# Patient Record
Sex: Female | Born: 1945
Health system: Southern US, Community
[De-identification: ages and names within clinical notes are randomized; demographics above are authoritative.]

## PROBLEM LIST (undated history)

## (undated) DIAGNOSIS — E785 Hyperlipidemia, unspecified: Secondary | ICD-10-CM

## (undated) DIAGNOSIS — M869 Osteomyelitis, unspecified: Secondary | ICD-10-CM

## (undated) DIAGNOSIS — R110 Nausea: Secondary | ICD-10-CM

## (undated) DIAGNOSIS — I10 Essential (primary) hypertension: Secondary | ICD-10-CM

## (undated) DIAGNOSIS — E039 Hypothyroidism, unspecified: Secondary | ICD-10-CM

## (undated) DIAGNOSIS — E669 Obesity, unspecified: Secondary | ICD-10-CM

## (undated) DIAGNOSIS — Z8489 Family history of other specified conditions: Secondary | ICD-10-CM

## (undated) DIAGNOSIS — M1990 Unspecified osteoarthritis, unspecified site: Secondary | ICD-10-CM

## (undated) HISTORY — PX: OTHER SURGICAL HISTORY: SHX169

## (undated) HISTORY — PX: CHOLECYSTECTOMY: SHX55

## (undated) HISTORY — DX: Hyperlipidemia, unspecified: E78.5

## (undated) HISTORY — DX: Obesity, unspecified: E66.9

## (undated) HISTORY — DX: Osteomyelitis, unspecified: M86.9

## (undated) HISTORY — PX: EYE SURGERY: SHX253

## (undated) HISTORY — PX: BREAST EXCISIONAL BIOPSY: SUR124

## (undated) HISTORY — DX: Hypothyroidism, unspecified: E03.9

## (undated) HISTORY — PX: TONSILLECTOMY: SUR1361

## (undated) HISTORY — DX: Unspecified osteoarthritis, unspecified site: M19.90

## (undated) HISTORY — PX: ABDOMINAL HYSTERECTOMY: SHX81

---

## 1982-08-26 HISTORY — PX: BACK SURGERY: SHX140

## 1991-08-27 HISTORY — PX: CARPAL TUNNEL RELEASE: SHX101

## 1996-02-24 HISTORY — PX: ANKLE FUSION: SHX881

## 1997-10-24 ENCOUNTER — Encounter (INDEPENDENT_AMBULATORY_CARE_PROVIDER_SITE_OTHER): Payer: Self-pay | Admitting: *Deleted

## 1997-10-24 LAB — CONVERTED CEMR LAB

## 1998-03-14 ENCOUNTER — Encounter: Admission: RE | Admit: 1998-03-14 | Discharge: 1998-03-14 | Payer: Self-pay | Admitting: Family Medicine

## 1998-03-16 ENCOUNTER — Ambulatory Visit (HOSPITAL_COMMUNITY): Admission: RE | Admit: 1998-03-16 | Discharge: 1998-03-16 | Payer: Self-pay | Admitting: *Deleted

## 1998-03-21 ENCOUNTER — Encounter: Admission: RE | Admit: 1998-03-21 | Discharge: 1998-03-21 | Payer: Self-pay | Admitting: Sports Medicine

## 1998-04-06 ENCOUNTER — Encounter: Admission: RE | Admit: 1998-04-06 | Discharge: 1998-04-06 | Payer: Self-pay | Admitting: Family Medicine

## 1998-04-26 ENCOUNTER — Encounter: Admission: RE | Admit: 1998-04-26 | Discharge: 1998-04-26 | Payer: Self-pay | Admitting: Family Medicine

## 1998-06-20 ENCOUNTER — Encounter: Admission: RE | Admit: 1998-06-20 | Discharge: 1998-06-20 | Payer: Self-pay | Admitting: Sports Medicine

## 1998-07-05 ENCOUNTER — Encounter: Admission: RE | Admit: 1998-07-05 | Discharge: 1998-07-05 | Payer: Self-pay | Admitting: Family Medicine

## 1998-07-11 ENCOUNTER — Encounter: Admission: RE | Admit: 1998-07-11 | Discharge: 1998-10-09 | Payer: Self-pay | Admitting: *Deleted

## 1998-08-01 ENCOUNTER — Encounter: Admission: RE | Admit: 1998-08-01 | Discharge: 1998-08-01 | Payer: Self-pay | Admitting: Sports Medicine

## 1998-08-07 ENCOUNTER — Encounter: Admission: RE | Admit: 1998-08-07 | Discharge: 1998-08-07 | Payer: Self-pay | Admitting: Family Medicine

## 1998-08-29 ENCOUNTER — Encounter: Admission: RE | Admit: 1998-08-29 | Discharge: 1998-08-29 | Payer: Self-pay | Admitting: Sports Medicine

## 1998-09-25 ENCOUNTER — Encounter: Admission: RE | Admit: 1998-09-25 | Discharge: 1998-09-25 | Payer: Self-pay | Admitting: Family Medicine

## 1998-11-02 ENCOUNTER — Encounter: Admission: RE | Admit: 1998-11-02 | Discharge: 1998-11-02 | Payer: Self-pay | Admitting: Family Medicine

## 1998-11-21 ENCOUNTER — Encounter: Admission: RE | Admit: 1998-11-21 | Discharge: 1998-11-21 | Payer: Self-pay | Admitting: Family Medicine

## 1998-11-21 ENCOUNTER — Ambulatory Visit (HOSPITAL_COMMUNITY): Admission: RE | Admit: 1998-11-21 | Discharge: 1998-11-21 | Payer: Self-pay

## 1998-12-14 ENCOUNTER — Encounter: Admission: RE | Admit: 1998-12-14 | Discharge: 1998-12-14 | Payer: Self-pay | Admitting: Family Medicine

## 1999-02-01 ENCOUNTER — Encounter: Admission: RE | Admit: 1999-02-01 | Discharge: 1999-02-01 | Payer: Self-pay | Admitting: Family Medicine

## 1999-04-25 ENCOUNTER — Encounter: Admission: RE | Admit: 1999-04-25 | Discharge: 1999-04-25 | Payer: Self-pay | Admitting: Family Medicine

## 1999-05-09 ENCOUNTER — Encounter: Admission: RE | Admit: 1999-05-09 | Discharge: 1999-05-09 | Payer: Self-pay | Admitting: Family Medicine

## 1999-06-22 ENCOUNTER — Encounter: Admission: RE | Admit: 1999-06-22 | Discharge: 1999-06-22 | Payer: Self-pay | Admitting: Sports Medicine

## 1999-09-26 ENCOUNTER — Encounter: Admission: RE | Admit: 1999-09-26 | Discharge: 1999-09-26 | Payer: Self-pay | Admitting: Family Medicine

## 2000-02-08 ENCOUNTER — Encounter: Admission: RE | Admit: 2000-02-08 | Discharge: 2000-02-08 | Payer: Self-pay | Admitting: Sports Medicine

## 2000-05-07 ENCOUNTER — Encounter: Admission: RE | Admit: 2000-05-07 | Discharge: 2000-05-07 | Payer: Self-pay | Admitting: *Deleted

## 2000-05-21 ENCOUNTER — Encounter: Admission: RE | Admit: 2000-05-21 | Discharge: 2000-05-21 | Payer: Self-pay | Admitting: Family Medicine

## 2000-07-10 ENCOUNTER — Encounter: Admission: RE | Admit: 2000-07-10 | Discharge: 2000-07-10 | Payer: Self-pay | Admitting: Family Medicine

## 2000-09-09 ENCOUNTER — Encounter: Admission: RE | Admit: 2000-09-09 | Discharge: 2000-09-09 | Payer: Self-pay | Admitting: Sports Medicine

## 2000-10-01 ENCOUNTER — Encounter: Admission: RE | Admit: 2000-10-01 | Discharge: 2000-10-01 | Payer: Self-pay | Admitting: Family Medicine

## 2000-12-17 ENCOUNTER — Encounter: Admission: RE | Admit: 2000-12-17 | Discharge: 2000-12-17 | Payer: Self-pay | Admitting: Family Medicine

## 2001-02-09 ENCOUNTER — Encounter: Admission: RE | Admit: 2001-02-09 | Discharge: 2001-02-09 | Payer: Self-pay | Admitting: Family Medicine

## 2001-04-21 ENCOUNTER — Encounter: Admission: RE | Admit: 2001-04-21 | Discharge: 2001-04-21 | Payer: Self-pay | Admitting: Pediatrics

## 2001-05-08 ENCOUNTER — Encounter: Payer: Self-pay | Admitting: Sports Medicine

## 2001-05-08 ENCOUNTER — Encounter: Admission: RE | Admit: 2001-05-08 | Discharge: 2001-05-08 | Payer: Self-pay | Admitting: Sports Medicine

## 2001-07-15 ENCOUNTER — Encounter: Admission: RE | Admit: 2001-07-15 | Discharge: 2001-07-15 | Payer: Self-pay | Admitting: Family Medicine

## 2001-08-14 ENCOUNTER — Encounter: Admission: RE | Admit: 2001-08-14 | Discharge: 2001-08-14 | Payer: Self-pay | Admitting: Family Medicine

## 2001-10-05 ENCOUNTER — Encounter: Admission: RE | Admit: 2001-10-05 | Discharge: 2001-10-05 | Payer: Self-pay | Admitting: Family Medicine

## 2001-12-29 ENCOUNTER — Encounter: Admission: RE | Admit: 2001-12-29 | Discharge: 2001-12-29 | Payer: Self-pay | Admitting: Family Medicine

## 2001-12-29 HISTORY — PX: NECK MASS EXCISION: SHX2079

## 2002-04-19 ENCOUNTER — Encounter: Admission: RE | Admit: 2002-04-19 | Discharge: 2002-04-19 | Payer: Self-pay | Admitting: Family Medicine

## 2002-04-21 ENCOUNTER — Encounter: Admission: RE | Admit: 2002-04-21 | Discharge: 2002-04-21 | Payer: Self-pay | Admitting: Family Medicine

## 2002-05-03 ENCOUNTER — Encounter: Admission: RE | Admit: 2002-05-03 | Discharge: 2002-05-03 | Payer: Self-pay | Admitting: Sports Medicine

## 2002-05-03 ENCOUNTER — Encounter: Payer: Self-pay | Admitting: Sports Medicine

## 2002-05-24 ENCOUNTER — Encounter: Admission: RE | Admit: 2002-05-24 | Discharge: 2002-05-24 | Payer: Self-pay | Admitting: Sports Medicine

## 2002-08-04 ENCOUNTER — Encounter: Admission: RE | Admit: 2002-08-04 | Discharge: 2002-08-04 | Payer: Self-pay | Admitting: Family Medicine

## 2002-09-22 ENCOUNTER — Encounter: Admission: RE | Admit: 2002-09-22 | Discharge: 2002-09-22 | Payer: Self-pay | Admitting: Family Medicine

## 2002-10-29 ENCOUNTER — Encounter: Admission: RE | Admit: 2002-10-29 | Discharge: 2002-10-29 | Payer: Self-pay | Admitting: Family Medicine

## 2003-01-21 ENCOUNTER — Encounter: Admission: RE | Admit: 2003-01-21 | Discharge: 2003-01-21 | Payer: Self-pay | Admitting: Family Medicine

## 2003-03-25 ENCOUNTER — Encounter: Admission: RE | Admit: 2003-03-25 | Discharge: 2003-03-25 | Payer: Self-pay | Admitting: Family Medicine

## 2003-04-25 ENCOUNTER — Encounter: Admission: RE | Admit: 2003-04-25 | Discharge: 2003-04-25 | Payer: Self-pay | Admitting: Sports Medicine

## 2003-05-19 ENCOUNTER — Encounter: Payer: Self-pay | Admitting: Sports Medicine

## 2003-05-19 ENCOUNTER — Encounter: Admission: RE | Admit: 2003-05-19 | Discharge: 2003-05-19 | Payer: Self-pay | Admitting: Sports Medicine

## 2003-05-26 ENCOUNTER — Encounter: Payer: Self-pay | Admitting: *Deleted

## 2003-05-26 ENCOUNTER — Encounter: Admission: RE | Admit: 2003-05-26 | Discharge: 2003-05-26 | Payer: Self-pay | Admitting: Family Medicine

## 2003-05-26 ENCOUNTER — Inpatient Hospital Stay (HOSPITAL_COMMUNITY): Admission: AD | Admit: 2003-05-26 | Discharge: 2003-05-28 | Payer: Self-pay | Admitting: Emergency Medicine

## 2003-05-27 ENCOUNTER — Encounter: Payer: Self-pay | Admitting: Cardiology

## 2003-06-20 ENCOUNTER — Encounter: Admission: RE | Admit: 2003-06-20 | Discharge: 2003-06-20 | Payer: Self-pay | Admitting: Family Medicine

## 2003-06-22 ENCOUNTER — Encounter: Admission: RE | Admit: 2003-06-22 | Discharge: 2003-06-22 | Payer: Self-pay | Admitting: Family Medicine

## 2003-06-28 ENCOUNTER — Encounter: Admission: RE | Admit: 2003-06-28 | Discharge: 2003-07-08 | Payer: Self-pay | Admitting: Family Medicine

## 2003-07-14 ENCOUNTER — Encounter: Admission: RE | Admit: 2003-07-14 | Discharge: 2003-07-14 | Payer: Self-pay | Admitting: Family Medicine

## 2003-09-13 ENCOUNTER — Encounter: Admission: RE | Admit: 2003-09-13 | Discharge: 2003-09-13 | Payer: Self-pay | Admitting: Family Medicine

## 2003-09-23 ENCOUNTER — Encounter: Admission: RE | Admit: 2003-09-23 | Discharge: 2003-09-23 | Payer: Self-pay | Admitting: Sports Medicine

## 2003-09-23 ENCOUNTER — Emergency Department (HOSPITAL_COMMUNITY): Admission: EM | Admit: 2003-09-23 | Discharge: 2003-09-23 | Payer: Self-pay | Admitting: *Deleted

## 2003-09-26 ENCOUNTER — Encounter: Admission: RE | Admit: 2003-09-26 | Discharge: 2003-09-26 | Payer: Self-pay | Admitting: Family Medicine

## 2003-09-26 ENCOUNTER — Emergency Department (HOSPITAL_COMMUNITY): Admission: EM | Admit: 2003-09-26 | Discharge: 2003-09-26 | Payer: Self-pay | Admitting: Emergency Medicine

## 2003-09-27 ENCOUNTER — Encounter: Admission: RE | Admit: 2003-09-27 | Discharge: 2003-09-27 | Payer: Self-pay | Admitting: Family Medicine

## 2003-09-30 ENCOUNTER — Encounter: Admission: RE | Admit: 2003-09-30 | Discharge: 2003-09-30 | Payer: Self-pay | Admitting: Family Medicine

## 2003-10-13 ENCOUNTER — Encounter: Admission: RE | Admit: 2003-10-13 | Discharge: 2004-01-11 | Payer: Self-pay | Admitting: Family Medicine

## 2003-10-14 ENCOUNTER — Encounter: Admission: RE | Admit: 2003-10-14 | Discharge: 2003-10-14 | Payer: Self-pay | Admitting: Family Medicine

## 2003-12-19 ENCOUNTER — Encounter: Admission: RE | Admit: 2003-12-19 | Discharge: 2003-12-19 | Payer: Self-pay | Admitting: Family Medicine

## 2004-01-02 ENCOUNTER — Encounter (HOSPITAL_BASED_OUTPATIENT_CLINIC_OR_DEPARTMENT_OTHER): Admission: RE | Admit: 2004-01-02 | Discharge: 2004-01-05 | Payer: Self-pay | Admitting: Internal Medicine

## 2004-01-16 ENCOUNTER — Encounter: Admission: RE | Admit: 2004-01-16 | Discharge: 2004-01-16 | Payer: Self-pay | Admitting: Family Medicine

## 2004-04-06 ENCOUNTER — Encounter (HOSPITAL_BASED_OUTPATIENT_CLINIC_OR_DEPARTMENT_OTHER): Admission: RE | Admit: 2004-04-06 | Discharge: 2004-04-13 | Payer: Self-pay | Admitting: Internal Medicine

## 2004-04-13 ENCOUNTER — Encounter: Admission: RE | Admit: 2004-04-13 | Discharge: 2004-04-13 | Payer: Self-pay | Admitting: Family Medicine

## 2004-06-04 ENCOUNTER — Emergency Department (HOSPITAL_COMMUNITY): Admission: EM | Admit: 2004-06-04 | Discharge: 2004-06-04 | Payer: Self-pay | Admitting: Emergency Medicine

## 2004-06-13 ENCOUNTER — Ambulatory Visit: Payer: Self-pay | Admitting: Family Medicine

## 2004-06-20 ENCOUNTER — Encounter: Admission: RE | Admit: 2004-06-20 | Discharge: 2004-06-20 | Payer: Self-pay | Admitting: Sports Medicine

## 2004-07-16 ENCOUNTER — Ambulatory Visit: Payer: Self-pay | Admitting: Family Medicine

## 2004-07-31 ENCOUNTER — Ambulatory Visit: Payer: Self-pay | Admitting: Sports Medicine

## 2004-08-16 ENCOUNTER — Ambulatory Visit: Payer: Self-pay | Admitting: Sports Medicine

## 2004-10-01 ENCOUNTER — Ambulatory Visit: Payer: Self-pay | Admitting: Sports Medicine

## 2004-10-11 ENCOUNTER — Emergency Department (HOSPITAL_COMMUNITY): Admission: EM | Admit: 2004-10-11 | Discharge: 2004-10-11 | Payer: Self-pay | Admitting: Emergency Medicine

## 2004-10-29 ENCOUNTER — Ambulatory Visit: Payer: Self-pay | Admitting: Sports Medicine

## 2004-12-25 ENCOUNTER — Ambulatory Visit: Payer: Self-pay | Admitting: Family Medicine

## 2005-01-03 ENCOUNTER — Encounter: Admission: RE | Admit: 2005-01-03 | Discharge: 2005-01-03 | Payer: Self-pay | Admitting: Sports Medicine

## 2005-01-23 ENCOUNTER — Ambulatory Visit: Payer: Self-pay | Admitting: Family Medicine

## 2005-04-18 ENCOUNTER — Ambulatory Visit: Payer: Self-pay | Admitting: Sports Medicine

## 2005-04-24 ENCOUNTER — Ambulatory Visit: Payer: Self-pay | Admitting: Family Medicine

## 2005-05-01 ENCOUNTER — Emergency Department (HOSPITAL_COMMUNITY): Admission: EM | Admit: 2005-05-01 | Discharge: 2005-05-01 | Payer: Self-pay | Admitting: Emergency Medicine

## 2005-05-30 ENCOUNTER — Ambulatory Visit: Payer: Self-pay | Admitting: Family Medicine

## 2005-07-11 ENCOUNTER — Emergency Department (HOSPITAL_COMMUNITY): Admission: EM | Admit: 2005-07-11 | Discharge: 2005-07-11 | Payer: Self-pay | Admitting: Family Medicine

## 2005-07-17 ENCOUNTER — Ambulatory Visit: Payer: Self-pay | Admitting: Family Medicine

## 2005-08-09 ENCOUNTER — Encounter: Admission: RE | Admit: 2005-08-09 | Discharge: 2005-08-09 | Payer: Self-pay | Admitting: Sports Medicine

## 2005-10-01 ENCOUNTER — Ambulatory Visit: Payer: Self-pay | Admitting: Family Medicine

## 2005-12-27 ENCOUNTER — Ambulatory Visit: Payer: Self-pay | Admitting: Family Medicine

## 2006-03-07 ENCOUNTER — Ambulatory Visit: Payer: Self-pay | Admitting: Family Medicine

## 2006-04-29 ENCOUNTER — Ambulatory Visit: Payer: Self-pay | Admitting: Sports Medicine

## 2006-05-21 ENCOUNTER — Ambulatory Visit: Payer: Self-pay | Admitting: Family Medicine

## 2006-05-22 ENCOUNTER — Ambulatory Visit: Payer: Self-pay | Admitting: Sports Medicine

## 2006-08-13 ENCOUNTER — Ambulatory Visit: Payer: Self-pay | Admitting: Family Medicine

## 2006-10-02 ENCOUNTER — Encounter: Admission: RE | Admit: 2006-10-02 | Discharge: 2006-10-02 | Payer: Self-pay | Admitting: Sports Medicine

## 2006-10-23 DIAGNOSIS — M159 Polyosteoarthritis, unspecified: Secondary | ICD-10-CM | POA: Insufficient documentation

## 2006-10-23 DIAGNOSIS — E669 Obesity, unspecified: Secondary | ICD-10-CM | POA: Insufficient documentation

## 2006-10-23 DIAGNOSIS — E039 Hypothyroidism, unspecified: Secondary | ICD-10-CM | POA: Insufficient documentation

## 2006-10-23 DIAGNOSIS — I10 Essential (primary) hypertension: Secondary | ICD-10-CM | POA: Insufficient documentation

## 2006-10-24 ENCOUNTER — Encounter (INDEPENDENT_AMBULATORY_CARE_PROVIDER_SITE_OTHER): Payer: Self-pay | Admitting: *Deleted

## 2006-11-27 ENCOUNTER — Telehealth (INDEPENDENT_AMBULATORY_CARE_PROVIDER_SITE_OTHER): Payer: Self-pay | Admitting: *Deleted

## 2006-12-11 ENCOUNTER — Ambulatory Visit: Payer: Self-pay | Admitting: Family Medicine

## 2006-12-11 LAB — CONVERTED CEMR LAB: Hgb A1c MFr Bld: 6.7 %

## 2007-03-18 ENCOUNTER — Encounter (INDEPENDENT_AMBULATORY_CARE_PROVIDER_SITE_OTHER): Payer: Self-pay | Admitting: Family Medicine

## 2007-03-18 ENCOUNTER — Ambulatory Visit: Payer: Self-pay | Admitting: Family Medicine

## 2007-03-18 DIAGNOSIS — E785 Hyperlipidemia, unspecified: Secondary | ICD-10-CM | POA: Insufficient documentation

## 2007-03-18 LAB — CONVERTED CEMR LAB: Hgb A1c MFr Bld: 6.8 %

## 2007-03-19 ENCOUNTER — Encounter (INDEPENDENT_AMBULATORY_CARE_PROVIDER_SITE_OTHER): Payer: Self-pay | Admitting: Family Medicine

## 2007-03-19 LAB — CONVERTED CEMR LAB
ALT: <8 units/L (ref 0–35)
AST: 10 units/L (ref 0–37)
Albumin: 4 g/dL (ref 3.5–5.2)
Alkaline Phosphatase: 59 units/L (ref 39–117)
BUN: 23 mg/dL (ref 6–23)
CO2: 27 meq/L (ref 19–32)
Calcium: 9.4 mg/dL (ref 8.4–10.5)
Chloride: 105 meq/L (ref 96–112)
Creatinine, Ser: 0.88 mg/dL (ref 0.40–1.20)
Direct LDL: 127 mg/dL — ABNORMAL HIGH
Glucose, Bld: 158 mg/dL — ABNORMAL HIGH (ref 70–99)
Potassium: 3.6 meq/L (ref 3.5–5.3)
Sodium: 146 meq/L — ABNORMAL HIGH (ref 135–145)
TSH: 1.626 microintl units/mL (ref 0.350–5.50)
Total Bilirubin: 0.2 mg/dL — ABNORMAL LOW (ref 0.3–1.2)
Total Protein: 6.9 g/dL (ref 6.0–8.3)

## 2007-03-24 ENCOUNTER — Encounter: Payer: Self-pay | Admitting: *Deleted

## 2007-03-26 ENCOUNTER — Telehealth: Payer: Self-pay | Admitting: *Deleted

## 2007-04-17 ENCOUNTER — Telehealth (INDEPENDENT_AMBULATORY_CARE_PROVIDER_SITE_OTHER): Payer: Self-pay | Admitting: Family Medicine

## 2007-06-19 ENCOUNTER — Ambulatory Visit: Payer: Self-pay | Admitting: Family Medicine

## 2007-06-19 LAB — CONVERTED CEMR LAB: Hgb A1c MFr Bld: 6.9 %

## 2007-07-09 ENCOUNTER — Encounter (INDEPENDENT_AMBULATORY_CARE_PROVIDER_SITE_OTHER): Payer: Self-pay | Admitting: Family Medicine

## 2007-07-17 ENCOUNTER — Ambulatory Visit: Payer: Self-pay | Admitting: Family Medicine

## 2007-07-17 ENCOUNTER — Encounter (INDEPENDENT_AMBULATORY_CARE_PROVIDER_SITE_OTHER): Payer: Self-pay | Admitting: Family Medicine

## 2007-07-17 LAB — CONVERTED CEMR LAB
ALT: <8 units/L (ref 0–35)
AST: 11 units/L (ref 0–37)
Albumin: 4.5 g/dL (ref 3.5–5.2)
Alkaline Phosphatase: 68 units/L (ref 39–117)
BUN: 15 mg/dL (ref 6–23)
CO2: 28 meq/L (ref 19–32)
Calcium: 10.2 mg/dL (ref 8.4–10.5)
Chloride: 102 meq/L (ref 96–112)
Creatinine, Ser: 0.89 mg/dL (ref 0.40–1.20)
Direct LDL: 97 mg/dL
Glucose, Bld: 103 mg/dL — ABNORMAL HIGH (ref 70–99)
Potassium: 3.9 meq/L (ref 3.5–5.3)
Sodium: 143 meq/L (ref 135–145)
Total Bilirubin: 0.4 mg/dL (ref 0.3–1.2)
Total Protein: 7.3 g/dL (ref 6.0–8.3)

## 2007-07-20 ENCOUNTER — Telehealth (INDEPENDENT_AMBULATORY_CARE_PROVIDER_SITE_OTHER): Payer: Self-pay | Admitting: Family Medicine

## 2007-08-04 ENCOUNTER — Encounter (INDEPENDENT_AMBULATORY_CARE_PROVIDER_SITE_OTHER): Payer: Self-pay | Admitting: Family Medicine

## 2007-08-05 ENCOUNTER — Encounter (INDEPENDENT_AMBULATORY_CARE_PROVIDER_SITE_OTHER): Payer: Self-pay | Admitting: Family Medicine

## 2007-10-05 ENCOUNTER — Encounter: Admission: RE | Admit: 2007-10-05 | Discharge: 2007-10-05 | Payer: Self-pay | Admitting: Sports Medicine

## 2007-10-12 ENCOUNTER — Ambulatory Visit: Payer: Self-pay | Admitting: Sports Medicine

## 2007-10-12 ENCOUNTER — Encounter (INDEPENDENT_AMBULATORY_CARE_PROVIDER_SITE_OTHER): Payer: Self-pay | Admitting: Family Medicine

## 2007-10-12 LAB — CONVERTED CEMR LAB
ALT: <8 units/L (ref 0–35)
AST: 10 units/L (ref 0–37)
Albumin: 4.2 g/dL (ref 3.5–5.2)
Alkaline Phosphatase: 65 units/L (ref 39–117)
BUN: 28 mg/dL — ABNORMAL HIGH (ref 6–23)
CO2: 26 meq/L (ref 19–32)
Calcium: 9.8 mg/dL (ref 8.4–10.5)
Chloride: 104 meq/L (ref 96–112)
Creatinine, Ser: 0.87 mg/dL (ref 0.40–1.20)
Direct LDL: 81 mg/dL
Glucose, Bld: 111 mg/dL — ABNORMAL HIGH (ref 70–99)
Hgb A1c MFr Bld: 6.9 %
Potassium: 3.9 meq/L (ref 3.5–5.3)
Sodium: 143 meq/L (ref 135–145)
Total Bilirubin: 0.3 mg/dL (ref 0.3–1.2)
Total Protein: 6.6 g/dL (ref 6.0–8.3)

## 2007-10-13 ENCOUNTER — Encounter (INDEPENDENT_AMBULATORY_CARE_PROVIDER_SITE_OTHER): Payer: Self-pay | Admitting: Family Medicine

## 2007-10-14 ENCOUNTER — Telehealth: Payer: Self-pay | Admitting: *Deleted

## 2007-10-19 ENCOUNTER — Ambulatory Visit: Payer: Self-pay | Admitting: Sports Medicine

## 2007-12-11 ENCOUNTER — Ambulatory Visit: Payer: Self-pay | Admitting: Family Medicine

## 2007-12-15 ENCOUNTER — Telehealth (INDEPENDENT_AMBULATORY_CARE_PROVIDER_SITE_OTHER): Payer: Self-pay | Admitting: Family Medicine

## 2008-02-11 ENCOUNTER — Encounter (INDEPENDENT_AMBULATORY_CARE_PROVIDER_SITE_OTHER): Payer: Self-pay | Admitting: Family Medicine

## 2008-02-17 ENCOUNTER — Encounter (INDEPENDENT_AMBULATORY_CARE_PROVIDER_SITE_OTHER): Payer: Self-pay | Admitting: Family Medicine

## 2008-02-17 ENCOUNTER — Ambulatory Visit: Payer: Self-pay | Admitting: Family Medicine

## 2008-02-17 LAB — CONVERTED CEMR LAB

## 2008-02-18 ENCOUNTER — Encounter (INDEPENDENT_AMBULATORY_CARE_PROVIDER_SITE_OTHER): Payer: Self-pay | Admitting: Family Medicine

## 2008-02-18 LAB — CONVERTED CEMR LAB
ALT: <8 units/L (ref 0–35)
AST: 11 units/L (ref 0–37)
Albumin: 4.2 g/dL (ref 3.5–5.2)
Alkaline Phosphatase: 63 units/L (ref 39–117)
BUN: 33 mg/dL — ABNORMAL HIGH (ref 6–23)
CO2: 26 meq/L (ref 19–32)
Calcium: 9.7 mg/dL (ref 8.4–10.5)
Chloride: 99 meq/L (ref 96–112)
Creatinine, Ser: 1.01 mg/dL (ref 0.40–1.20)
Direct LDL: 79 mg/dL
Glucose, Bld: 128 mg/dL — ABNORMAL HIGH (ref 70–99)
Potassium: 3.8 meq/L (ref 3.5–5.3)
Sodium: 141 meq/L (ref 135–145)
TSH: 0.168 microintl units/mL — ABNORMAL LOW (ref 0.350–5.50)
Total Bilirubin: 0.3 mg/dL (ref 0.3–1.2)
Total Protein: 6.9 g/dL (ref 6.0–8.3)

## 2008-03-01 ENCOUNTER — Telehealth: Payer: Self-pay | Admitting: *Deleted

## 2008-03-22 ENCOUNTER — Ambulatory Visit: Payer: Self-pay | Admitting: Family Medicine

## 2008-04-19 ENCOUNTER — Telehealth: Payer: Self-pay | Admitting: *Deleted

## 2008-04-20 ENCOUNTER — Ambulatory Visit: Payer: Self-pay | Admitting: Family Medicine

## 2008-06-01 ENCOUNTER — Encounter: Payer: Self-pay | Admitting: Family Medicine

## 2008-06-01 ENCOUNTER — Ambulatory Visit: Payer: Self-pay | Admitting: Family Medicine

## 2008-06-01 LAB — CONVERTED CEMR LAB
BUN: 25 mg/dL — ABNORMAL HIGH (ref 6–23)
CO2: 23 meq/L (ref 19–32)
Calcium: 10 mg/dL (ref 8.4–10.5)
Chloride: 98 meq/L (ref 96–112)
Cholesterol, target level: 200 mg/dL
Creatinine, Ser: 0.9 mg/dL (ref 0.40–1.20)
Glucose, Bld: 289 mg/dL — ABNORMAL HIGH (ref 70–99)
HDL goal, serum: 40 mg/dL
Hgb A1c MFr Bld: 9.9 %
LDL Goal: 100 mg/dL
Microalbumin U total vol: NORMAL mg/L
Potassium: 4.3 meq/L (ref 3.5–5.3)
Sodium: 136 meq/L (ref 135–145)
TSH: 0.216 microintl units/mL — ABNORMAL LOW (ref 0.350–4.50)

## 2008-06-03 ENCOUNTER — Telehealth: Payer: Self-pay | Admitting: Family Medicine

## 2008-06-23 ENCOUNTER — Ambulatory Visit: Payer: Self-pay | Admitting: Family Medicine

## 2008-06-23 ENCOUNTER — Telehealth (INDEPENDENT_AMBULATORY_CARE_PROVIDER_SITE_OTHER): Payer: Self-pay | Admitting: *Deleted

## 2008-08-12 ENCOUNTER — Ambulatory Visit: Payer: Self-pay | Admitting: Family Medicine

## 2008-08-12 ENCOUNTER — Encounter: Payer: Self-pay | Admitting: Family Medicine

## 2008-08-12 LAB — CONVERTED CEMR LAB
ALT: 10 units/L (ref 0–35)
AST: 10 units/L (ref 0–37)
Albumin: 4.4 g/dL (ref 3.5–5.2)
Alkaline Phosphatase: 74 units/L (ref 39–117)
BUN: 20 mg/dL (ref 6–23)
CO2: 27 meq/L (ref 19–32)
Calcium: 10.2 mg/dL (ref 8.4–10.5)
Chloride: 99 meq/L (ref 96–112)
Creatinine, Ser: 0.87 mg/dL (ref 0.40–1.20)
Glucose, Bld: 171 mg/dL — ABNORMAL HIGH (ref 70–99)
Potassium: 3.8 meq/L (ref 3.5–5.3)
Sodium: 139 meq/L (ref 135–145)
TSH: 0.848 microintl units/mL (ref 0.350–4.50)
Total Bilirubin: 0.4 mg/dL (ref 0.3–1.2)
Total Protein: 7.2 g/dL (ref 6.0–8.3)

## 2008-08-21 ENCOUNTER — Encounter: Payer: Self-pay | Admitting: Family Medicine

## 2008-10-07 ENCOUNTER — Ambulatory Visit: Payer: Self-pay | Admitting: Family Medicine

## 2008-10-07 DIAGNOSIS — M1990 Unspecified osteoarthritis, unspecified site: Secondary | ICD-10-CM

## 2008-10-07 HISTORY — DX: Unspecified osteoarthritis, unspecified site: M19.90

## 2008-10-07 LAB — CONVERTED CEMR LAB: Hgb A1c MFr Bld: 10.1 %

## 2008-10-13 ENCOUNTER — Encounter: Payer: Self-pay | Admitting: Family Medicine

## 2008-10-13 ENCOUNTER — Ambulatory Visit: Payer: Self-pay | Admitting: Family Medicine

## 2008-10-13 LAB — CONVERTED CEMR LAB
Cholesterol: 131 mg/dL (ref 0–200)
HDL: 43 mg/dL (ref >39)
LDL Cholesterol: 71 mg/dL (ref 0–99)
Total CHOL/HDL Ratio: 3
Triglycerides: 83 mg/dL (ref <150)
VLDL: 17 mg/dL (ref 0–40)

## 2008-11-28 ENCOUNTER — Encounter: Payer: Self-pay | Admitting: Family Medicine

## 2008-11-28 DIAGNOSIS — E1139 Type 2 diabetes mellitus with other diabetic ophthalmic complication: Secondary | ICD-10-CM | POA: Insufficient documentation

## 2008-11-28 DIAGNOSIS — E119 Type 2 diabetes mellitus without complications: Secondary | ICD-10-CM

## 2008-12-02 ENCOUNTER — Emergency Department: Payer: Self-pay | Admitting: Emergency Medicine

## 2009-01-30 ENCOUNTER — Ambulatory Visit: Payer: Self-pay | Admitting: Family Medicine

## 2009-01-30 ENCOUNTER — Encounter: Payer: Self-pay | Admitting: Family Medicine

## 2009-01-30 LAB — CONVERTED CEMR LAB
ALT: 9 units/L (ref 0–35)
AST: 13 units/L (ref 0–37)
Albumin: 4.1 g/dL (ref 3.5–5.2)
Alkaline Phosphatase: 55 units/L (ref 39–117)
BUN: 18 mg/dL (ref 6–23)
CO2: 27 meq/L (ref 19–32)
Calcium: 9.5 mg/dL (ref 8.4–10.5)
Chloride: 105 meq/L (ref 96–112)
Creatinine, Ser: 0.91 mg/dL (ref 0.40–1.20)
Glucose, Bld: 107 mg/dL — ABNORMAL HIGH (ref 70–99)
Hgb A1c MFr Bld: 8.6 %
Potassium: 4.2 meq/L (ref 3.5–5.3)
Sodium: 145 meq/L (ref 135–145)
Total Bilirubin: 0.3 mg/dL (ref 0.3–1.2)
Total Protein: 6.6 g/dL (ref 6.0–8.3)

## 2009-02-01 ENCOUNTER — Encounter: Payer: Self-pay | Admitting: Family Medicine

## 2009-02-02 ENCOUNTER — Telehealth: Payer: Self-pay | Admitting: Family Medicine

## 2009-03-15 ENCOUNTER — Telehealth: Payer: Self-pay | Admitting: Family Medicine

## 2009-03-15 ENCOUNTER — Ambulatory Visit: Payer: Self-pay | Admitting: Family Medicine

## 2009-05-10 ENCOUNTER — Ambulatory Visit: Payer: Self-pay | Admitting: Family Medicine

## 2009-05-10 LAB — CONVERTED CEMR LAB: Hgb A1c MFr Bld: 8.3 %

## 2009-06-12 ENCOUNTER — Ambulatory Visit: Payer: Self-pay | Admitting: Sports Medicine

## 2009-08-11 ENCOUNTER — Encounter: Payer: Self-pay | Admitting: Family Medicine

## 2009-08-11 ENCOUNTER — Ambulatory Visit: Payer: Self-pay | Admitting: Family Medicine

## 2009-08-11 LAB — CONVERTED CEMR LAB
ALT: 8 units/L (ref 0–35)
AST: 11 units/L (ref 0–37)
Albumin: 4.5 g/dL (ref 3.5–5.2)
Alkaline Phosphatase: 69 units/L (ref 39–117)
BUN: 22 mg/dL (ref 6–23)
CO2: 27 meq/L (ref 19–32)
Calcium: 10.2 mg/dL (ref 8.4–10.5)
Chloride: 98 meq/L (ref 96–112)
Creatinine, Ser: 0.94 mg/dL (ref 0.40–1.20)
Glucose, Bld: 177 mg/dL — ABNORMAL HIGH (ref 70–99)
Hgb A1c MFr Bld: 8.6 %
Potassium: 3.7 meq/L (ref 3.5–5.3)
Sodium: 140 meq/L (ref 135–145)
TSH: 0.941 microintl units/mL (ref 0.350–4.500)
Total Bilirubin: 0.3 mg/dL (ref 0.3–1.2)
Total Protein: 7.2 g/dL (ref 6.0–8.3)

## 2009-08-12 ENCOUNTER — Encounter: Payer: Self-pay | Admitting: Family Medicine

## 2009-09-05 ENCOUNTER — Encounter: Payer: Self-pay | Admitting: Family Medicine

## 2009-11-01 ENCOUNTER — Encounter: Payer: Self-pay | Admitting: Family Medicine

## 2009-11-01 ENCOUNTER — Ambulatory Visit: Payer: Self-pay | Admitting: Family Medicine

## 2009-11-01 LAB — CONVERTED CEMR LAB
Direct LDL: 168 mg/dL — ABNORMAL HIGH
Hgb A1c MFr Bld: 7.1 %

## 2009-11-07 ENCOUNTER — Telehealth: Payer: Self-pay | Admitting: Family Medicine

## 2009-12-13 ENCOUNTER — Encounter: Payer: Self-pay | Admitting: Family Medicine

## 2009-12-13 ENCOUNTER — Ambulatory Visit: Payer: Self-pay | Admitting: Family Medicine

## 2009-12-20 ENCOUNTER — Telehealth: Payer: Self-pay | Admitting: Family Medicine

## 2009-12-22 ENCOUNTER — Encounter: Payer: Self-pay | Admitting: Family Medicine

## 2009-12-22 ENCOUNTER — Telehealth: Payer: Self-pay | Admitting: Family Medicine

## 2009-12-22 ENCOUNTER — Ambulatory Visit: Payer: Self-pay | Admitting: Family Medicine

## 2009-12-25 ENCOUNTER — Encounter: Payer: Self-pay | Admitting: *Deleted

## 2010-02-06 ENCOUNTER — Encounter: Payer: Self-pay | Admitting: Family Medicine

## 2010-02-21 ENCOUNTER — Emergency Department: Payer: Self-pay | Admitting: Unknown Physician Specialty

## 2010-02-22 ENCOUNTER — Telehealth: Payer: Self-pay | Admitting: Sports Medicine

## 2010-02-23 ENCOUNTER — Ambulatory Visit: Payer: Self-pay | Admitting: Family Medicine

## 2010-02-23 ENCOUNTER — Encounter: Payer: Self-pay | Admitting: Sports Medicine

## 2010-02-23 DIAGNOSIS — K8020 Calculus of gallbladder without cholecystitis without obstruction: Secondary | ICD-10-CM | POA: Insufficient documentation

## 2010-03-01 ENCOUNTER — Ambulatory Visit: Payer: Self-pay | Admitting: Family Medicine

## 2010-03-01 ENCOUNTER — Encounter: Payer: Self-pay | Admitting: Sports Medicine

## 2010-03-01 LAB — CONVERTED CEMR LAB
Bilirubin Urine: NEGATIVE
Blood in Urine, dipstick: NEGATIVE
Cholesterol: 99 mg/dL (ref 0–200)
Glucose, Urine, Semiquant: NEGATIVE
HDL: 32 mg/dL — ABNORMAL LOW (ref >39)
Hgb A1c MFr Bld: 7.1 %
Ketones, urine, test strip: NEGATIVE
LDL Cholesterol: 36 mg/dL (ref 0–99)
Microalbumin U total vol: NORMAL mg/L
Nitrite: NEGATIVE
Protein, U semiquant: NEGATIVE
Specific Gravity, Urine: 1.025
Total CHOL/HDL Ratio: 3.1
Triglycerides: 157 mg/dL — ABNORMAL HIGH (ref <150)
Urobilinogen, UA: 0.2
VLDL: 31 mg/dL (ref 0–40)
pH: 5.5

## 2010-03-15 ENCOUNTER — Encounter: Admission: RE | Admit: 2010-03-15 | Discharge: 2010-03-15 | Payer: Self-pay | Admitting: Sports Medicine

## 2010-03-15 ENCOUNTER — Ambulatory Visit: Payer: Self-pay | Admitting: Family Medicine

## 2010-04-02 ENCOUNTER — Inpatient Hospital Stay (HOSPITAL_COMMUNITY): Admission: RE | Admit: 2010-04-02 | Discharge: 2010-04-04 | Payer: Self-pay | Admitting: General Surgery

## 2010-04-02 ENCOUNTER — Encounter (INDEPENDENT_AMBULATORY_CARE_PROVIDER_SITE_OTHER): Payer: Self-pay | Admitting: General Surgery

## 2010-04-04 ENCOUNTER — Encounter (INDEPENDENT_AMBULATORY_CARE_PROVIDER_SITE_OTHER): Payer: Self-pay | Admitting: General Surgery

## 2010-04-04 ENCOUNTER — Ambulatory Visit: Payer: Self-pay | Admitting: Vascular Surgery

## 2010-05-10 ENCOUNTER — Telehealth (INDEPENDENT_AMBULATORY_CARE_PROVIDER_SITE_OTHER): Payer: Self-pay | Admitting: Family Medicine

## 2010-05-29 ENCOUNTER — Ambulatory Visit: Payer: Self-pay | Admitting: Family Medicine

## 2010-05-29 ENCOUNTER — Encounter: Payer: Self-pay | Admitting: Sports Medicine

## 2010-05-29 LAB — CONVERTED CEMR LAB
HCT: 38.5 % (ref 36.0–46.0)
Hemoglobin: 11.7 g/dL — ABNORMAL LOW (ref 12.0–15.0)
MCHC: 30.4 g/dL (ref 30.0–36.0)
MCV: 83.3 fL (ref 78.0–100.0)
Platelets: 290 10*3/uL (ref 150–400)
RBC: 4.62 M/uL (ref 3.87–5.11)
RDW: 15.6 % — ABNORMAL HIGH (ref 11.5–15.5)
TSH: 0.67 microintl units/mL (ref 0.350–4.500)
WBC: 10 10*3/uL (ref 4.0–10.5)

## 2010-07-02 ENCOUNTER — Encounter: Payer: Self-pay | Admitting: Sports Medicine

## 2010-07-12 ENCOUNTER — Telehealth: Payer: Self-pay | Admitting: Sports Medicine

## 2010-09-05 ENCOUNTER — Encounter: Payer: Self-pay | Admitting: Sports Medicine

## 2010-09-05 ENCOUNTER — Ambulatory Visit: Admission: RE | Admit: 2010-09-05 | Discharge: 2010-09-05 | Payer: Self-pay | Source: Home / Self Care

## 2010-09-05 ENCOUNTER — Telehealth: Payer: Self-pay | Admitting: Family Medicine

## 2010-09-05 LAB — CONVERTED CEMR LAB
BUN: 14 mg/dL (ref 6–23)
CO2: 23 meq/L (ref 19–32)
Calcium: 9.7 mg/dL (ref 8.4–10.5)
Chloride: 104 meq/L (ref 96–112)
Creatinine, Ser: 0.82 mg/dL (ref 0.40–1.20)
Glucose, Bld: 90 mg/dL (ref 70–99)
HCT: 40.3 % (ref 36.0–46.0)
Hemoglobin: 12.6 g/dL (ref 12.0–15.0)
Hgb A1c MFr Bld: 7.9 %
MCHC: 31.3 g/dL (ref 30.0–36.0)
MCV: 84.3 fL (ref 78.0–100.0)
Platelets: 320 10*3/uL (ref 150–400)
Potassium: 4.5 meq/L (ref 3.5–5.3)
RBC: 4.78 M/uL (ref 3.87–5.11)
RDW: 15 % (ref 11.5–15.5)
Sodium: 137 meq/L (ref 135–145)
TSH: 1.772 microintl units/mL (ref 0.350–4.500)
WBC: 12.9 10*3/uL — ABNORMAL HIGH (ref 4.0–10.5)

## 2010-09-06 ENCOUNTER — Encounter: Payer: Self-pay | Admitting: *Deleted

## 2010-09-06 ENCOUNTER — Encounter: Payer: Self-pay | Admitting: Sports Medicine

## 2010-09-25 NOTE — Miscellaneous (Signed)
Summary: Rx Refills  Clinical Lists Changes Refilled and Faxed Rx refills for Metformin HCL 500mg  ( 2 tablets by mouth 2 times a day) with 1 RF, Synthroid 137 micrograms tablet (1 tablet once daily) wtih 1 RF, but did not refill Amlodipine 10 mg tablets because were not on med list in EMR.  Faxed abck to [facility] on [address] @ [phone] ...................................................................Maley Venezia CMA  March 24, 2007 10:59 AM

## 2010-09-25 NOTE — Consult Note (Signed)
Summary: [facility]  [facility]   Imported By: Denny Peon LEVAN 08/05/2007 12:03:19  _____________________________________________________________________  External Attachment:    Type:   Image     Comment:   External Document

## 2010-09-25 NOTE — Assessment & Plan Note (Signed)
Summary: FU MEDS/KH   Vital Signs:  Patient Profile:   65 Years Old Female Height:     67.5 inches (171.45 cm) Weight:      272.4 pounds BMI:     42.19 Temp:     98.0 degrees F oral Pulse rate:   66 / minute BP sitting:   133 / 63  (left arm)  Pt. in pain?   no  Vitals Entered By: Garen Grams LPN (October 19, 2007 11:08 AM)                  PCP:  Adrian Blackwater  MD  Chief Complaint:  f/u on meds.  History of Present Illness: HTN: No shortness of breath, chest pain, edema.  Tolerating medications well.    No side effects.  Brought meds with her so we could go over them.  DM: fastings have been 122-158.  Had decreased Lantus to 26.  14 day average was 170.         Physical Exam  General:     Well-developed,well-nourished,in no acute distress; alert,appropriate and cooperative throughout examination Lungs:     Normal respiratory effort, chest expands symmetrically. Lungs are clear to auscultation, no crackles or wheezes. Heart:     Normal rate and regular rhythm. S1 and S2 normal without gallop, murmur, click, rub or other extra sounds. Extremities:     no edema.    Impression & Recommendations:  Problem # 1:  HYPERTENSION, BENIGN SYSTEMIC (ICD-401.1) Assessment: Unchanged on two ace/arbs.  K+ ok but there is still a risk.  Will stop cozaar the more $$ one and doulbe up lisin/hctz to make up for it and increase her coverage since her DM goal is 130/80 and she is above this.  will f/u in 33mo. The following medications were removed from the medication list:    Cozaar 100 Mg Tabs (Losartan potassium) .Marland Kitchen... Take one tab by mouth qday  Her updated medication list for this problem includes:    Lisinopril-hydrochlorothiazide 20-12.5 Mg Tabs (Lisinopril-hydrochlorothiazide) .Marland Kitchen... 2  tablets by mouth qday    Coreg 12.5 Mg Tabs (Carvedilol) .Marland Kitchen... 1 by mouth bid  Orders: [facility]- Est Level  3 (01027)   Problem # 2:  DIABETES MELLITUS II, UNCOMPLICATED  (ICD-250.00) Assessment: Unchanged increasing lantus to 28 again. The following medications were removed from the medication list:    Cozaar 100 Mg Tabs (Losartan potassium) .Marland Kitchen... Take one tab by mouth qday  Her updated medication list for this problem includes:    Bayer Childrens Aspirin 81 Mg Chew (Aspirin) .Marland Kitchen... Take 1 tablet by mouth once a day    Glipizide 10 Mg Tabs (Glipizide) .Marland Kitchen... Take 1/2 tablet daily    Lantus 100 Unit/ml Soln (Insulin glargine) .Marland KitchenMarland KitchenMarland KitchenMarland Kitchen 44 units inj [place] qhs    Lisinopril-hydrochlorothiazide 20-12.5 Mg Tabs (Lisinopril-hydrochlorothiazide) .Marland Kitchen... 2  tablets by mouth qday    Glucophage 1000 Mg Tabs (Metformin hcl) .Marland Kitchen... Take 1 tablet twice daily  Orders: [facility]- Est Level  3 (25366)   Complete Medication List: 1)  Bayer Childrens Aspirin 81 Mg Chew (Aspirin) .... Take 1 tablet by mouth once a day 2)  Gemfibrozil 600 Mg Tabs (Gemfibrozil) .... Take 1 tablet by mouth twice a day 3)  Glipizide 10 Mg Tabs (Glipizide) .... Take 1/2 tablet daily 4)  Lantus 100 Unit/ml Soln (Insulin glargine) .... 44 units inj [place] qhs 5)  Lisinopril-hydrochlorothiazide 20-12.5 Mg Tabs (Lisinopril-hydrochlorothiazide) .... 2  tablets by mouth qday 6)  Synthroid 137 Mcg Tabs (  Levothyroxine sodium) .... One tablet by mouth qday 7)  Glucophage 1000 Mg Tabs (Metformin hcl) .... Take 1 tablet twice daily 8)  Lancets Misc (Lancets) .... 3 lancets qday 9)  Coreg 12.5 Mg Tabs (Carvedilol) .Marland Kitchen.. 1 by mouth bid 10)  Crestor 20 Mg Tabs (Rosuvastatin calcium) .Marland Kitchen.. 1 by mouth at bedtime     Prescriptions: LISINOPRIL-HYDROCHLOROTHIAZIDE 20-12.5 MG TABS (LISINOPRIL-HYDROCHLOROTHIAZIDE) 2  tablets by mouth qday  #68 x 11   Entered and Authorized by:   Rolm Gala MD   Signed by:   Rolm Gala MD on 10/19/2007   Method used:   Print then Give to Patient   RxID:   [id number] GLIPIZIDE 10 MG TABS (GLIPIZIDE) take 1/2 tablet daily  #30 x 11   Entered and Authorized by:   Rolm Gala MD    Signed by:   Rolm Gala MD on 10/19/2007   Method used:   Print then Give to Patient   RxID:   [id number] GEMFIBROZIL 600 MG TABS (GEMFIBROZIL) Take 1 tablet by mouth twice a day  #60 x 11   Entered and Authorized by:   Rolm Gala MD   Signed by:   Rolm Gala MD on 10/19/2007   Method used:   Print then Give to Patient   RxID:   [id number] GLIPIZIDE 10 MG TABS (GLIPIZIDE) take 1/2 tablet daily  #30 x 11   Entered and Authorized by:   Rolm Gala MD   Signed by:   Rolm Gala MD on 10/19/2007   Method used:   Electronically sent to ...       [facility]  [address] # [id number]*       [address]       [place], [place]  [address]       Ph: [phone] or [phone]       Fax: [phone]   RxID:   [id number] GEMFIBROZIL 600 MG TABS (GEMFIBROZIL) Take 1 tablet by mouth twice a day  #60 x 11   Entered and Authorized by:   Rolm Gala MD   Signed by:   Rolm Gala MD on 10/19/2007   Method used:   Electronically sent to ...       [facility]  [address] # [id number]*       [address]       [place], [place]  [address]       Ph: [phone] or [phone]       Fax: [phone]   RxID:   [id number] LISINOPRIL-HYDROCHLOROTHIAZIDE 20-12.5 MG TABS (LISINOPRIL-HYDROCHLOROTHIAZIDE) 2  tablets by mouth qday  #68 x 11   Entered and Authorized by:   Rolm Gala MD   Signed by:   Rolm Gala MD on 10/19/2007   Method used:   Electronically sent to ...       [facility]  [address] # [id number]*       [address]       [place], [place]  [address]       Ph: [phone] or [phone]       Fax: [phone]   RxID:   [id number]  ]

## 2010-09-25 NOTE — Assessment & Plan Note (Signed)
Summary: fu/kh   Vital Signs:  Patient Profile:   65 Years Old Female Height:     67.5 inches (171.45 cm) Weight:      274.1 pounds Temp:     98.1 degrees F Pulse rate:   62 / minute BP sitting:   133 / 68  Pt. in pain?   no  Vitals Entered By: Altamese Dilling CMA, (July 17, 2007 10:34 AM)                  PCP:  Adrian Blackwater  MD  Chief Complaint:  Cholesterol F/U.  History of Present Illness: 1. HTN: No SOB, CP, edmema.  Tolerating medications well.  Knows medication regiment.  No side effects.  started coreg last visit and bps at home have been much more controlled.    cholesterol - above goal at last check.  walkign more and eating less fried foods.  lost 3 ounds.  feels better.  fitting old clothes again.            Physical Exam  General:     Well-developed,well-nourished,in no acute distress; alert,appropriate and cooperative throughout examination Lungs:     Normal respiratory effort, chest expands symmetrically. Lungs are clear to auscultation, no crackles or wheezes. Heart:     Normal rate and regular rhythm. S1 and S2 normal without gallop, murmur, click, rub or other extra sounds. Extremities:     no edema.    Impression & Recommendations:  Problem # 1:  HYPERLIPIDEMIA (ICD-272.4) Assessment: Unchanged no side effects.  will check cmet and direct ldl (not fasting). Her updated medication list for this problem includes:    Gemfibrozil 600 Mg Tabs (Gemfibrozil) .Marland Kitchen... Take 1 tablet by mouth twice a day    Simvastatin 10 Mg Tabs (Simvastatin) .Marland Kitchen... 1 by mouth at bedtime  Orders: Direct LDL-[facility] (59563-87564) Comp Met-[facility] (33295-18841) [facility]- Est  Level 4 (66063)   Problem # 2:  HYPERTENSION, BENIGN SYSTEMIC (ICD-401.1) Assessment: Improved better on coreg.  f/u 2 months.   Her updated medication list for this problem includes:    Cozaar 100 Mg Tabs (Losartan potassium) .Marland Kitchen... Take one tab by mouth qday  Lisinopril-hydrochlorothiazide 20-12.5 Mg Tabs (Lisinopril-hydrochlorothiazide) ..... One tablet by mouth qday    Coreg 12.5 Mg Tabs (Carvedilol) .Marland Kitchen... 1 by mouth bid  Orders: [facility]- Est  Level 4 (99214)   Problem # 3:  BLOOD IN STOOL (ICD-578.1) Assessment: New need to schedule conoloscopy.  will ask rn to do. Orders: [facility]- Est  Level 4 (01601) Gastroenterology Referral (GI)   Complete Medication List: 1)  Bayer Childrens Aspirin 81 Mg Chew (Aspirin) .... Take 1 tablet by mouth once a day 2)  Cozaar 100 Mg Tabs (Losartan potassium) .... Take one tab by mouth qday 3)  Gemfibrozil 600 Mg Tabs (Gemfibrozil) .... Take 1 tablet by mouth twice a day 4)  Glipizide 10 Mg Tabs (Glipizide) .... Take 1/2 tablet daily 5)  Lantus 100 Unit/ml Soln (Insulin glargine) .... 44 units inj [place] qhs 6)  Lisinopril-hydrochlorothiazide 20-12.5 Mg Tabs (Lisinopril-hydrochlorothiazide) .... One tablet by mouth qday 7)  Synthroid 137 Mcg Tabs (Levothyroxine sodium) .... One tablet by mouth qday 8)  Glucophage 1000 Mg Tabs (Metformin hcl) .... Take 1 tablet twice daily 9)  Lancets Misc (Lancets) .... 3 lancets qday 10)  Coreg 12.5 Mg Tabs (Carvedilol) .Marland Kitchen.. 1 by mouth bid 11)  Simvastatin 10 Mg Tabs (Simvastatin) .Marland Kitchen.. 1 by mouth at bedtime     ]

## 2010-09-25 NOTE — Miscellaneous (Signed)
Summary: [facility]   Imported By: Clydell Hakim 12/21/2009 13:55:34  _____________________________________________________________________  External Attachment:    Type:   Image     Comment:   External Document

## 2010-09-25 NOTE — Assessment & Plan Note (Signed)
Summary: f/u dm,df   Vital Signs:  Patient profile:   65 year old female Height:      67.5 inches Weight:      270.5 pounds BMI:     41.89 Temp:     98.5 degrees F oral Pulse rate:   69 / minute BP sitting:   114 / 71  (left arm) Cuff size:   large  Vitals Entered By: Gladstone Pih (November 01, 2009 1:56 PM)  Nutrition Counseling: Patient's BMI is greater than 25 and therefore counseled on weight management options. CC: F/U DM Is Patient Diabetic? Yes Did you bring your meter with you today? No Pain Assessment Patient in pain? no         Primary Care Provider:  Lequita Asal  MD  CC:  F/U DM.  History of Present Illness: patient is 65 y/o female with h/o DM, HTN, hypothyroidism, HLD, who presents for routine f/u.  DM- several episodes of hypoglycemia with CBGs in 60s. has titrated lantus down to 30 units BID. on glyburide-metformin 2.5/500 mg BID. does report increased compliance with diabetic diet and weight loss of 14 lbs. CBG log reviewed: most fasting CBGs 80s-100s  HTN- denies CP, peripheral edema, DOE, palpitations. has stopped working out. planning on resuming in 2011. on lisinopril, hctz, coreg.   HLD- on crestor. no myalgias or other side effects.    Habits & Providers  Alcohol-Tobacco-Diet     Tobacco Status: never  Current Medications (verified): 1)  Bayer Childrens Aspirin 81 Mg Chew (Aspirin) .... Take 1 Tablet By Mouth Once A Day 2)  Gemfibrozil 600 Mg Tabs (Gemfibrozil) .... Take 1 Tablet By Mouth Twice A Day 3)  Lantus 100 Unit/ml Soln (Insulin Glargine) .... 30 Units Inj [place] Two Times A Day. Dispense 4 Vials 4)  Lisinopril-Hydrochlorothiazide 20-12.5 Mg Tabs (Lisinopril-Hydrochlorothiazide) .... 2  Tablets By Mouth Qday 5)  Levothyroxine Sodium 112 Mcg  Tabs (Levothyroxine Sodium) .Marland Kitchen.. 1 By Mouth Daily 6)  Carvedilol 6.25 Mg  Tabs (Carvedilol) .Marland Kitchen.. 1 Tab By Mouth Two Times Daily 7)  Crestor 20 Mg  Tabs (Rosuvastatin Calcium) .Marland Kitchen.. 1 By Mouth At  Bedtime 8)  Mobic 15 Mg Tabs (Meloxicam) .Marland Kitchen.. 1 Tablet By Mouth Daily For 2 Weeks, Then As Needed For Pain. 9)  Voltaren 1 % Gel (Diclofenac Sodium) .... Apply 2g To Shoulder or 4 G To Knees Qid As Needed For Pain. Dispense 100 G. 10)  Insulin Syringe Ult Thin Short 30g X 5/16" 1 Ml Misc (Insulin Syringe-Needle U-100) .... Use As Directed. 11)  Lancets   Misc (Lancets) .... 3 Lancets Qday 12)  Glyburide-Metformin 2.5-500 Mg Tabs (Glyburide-Metformin) .... One Tab By Mouth Bid  Allergies (verified): No Known Drug Allergies  Physical Exam  General:  NAD, obese, alert. vitals reviewed.  Mouth:  MMM Neck:  no JVD or thyromegaly Lungs:  Normal respiratory effort, chest expands symmetrically. Lungs are clear to auscultation, no crackles or wheezes. Heart:  Normal rate and regular rhythm. S1 and S2 normal without gallop, murmur, click, rub or other extra sounds. Abdomen:  obese, NT, ND, +BS Pulses:  2+ periph pulses bilaterally;  Extremities:  1+ edema RLE; no edema of LLE.    Impression & Recommendations:  Problem # 1:  DIABETES MELLITUS, TYPE II, UNCONTROLLED (ICD-250.02) Assessment Improved A1C 7.1. continue decreased dose of lantus and oral meds. foot exam performed and overall normal.   Her updated medication list for this problem includes:    Bayer Childrens Aspirin 81  Mg Chew (Aspirin) .Marland Kitchen... Take 1 tablet by mouth once a day    Lantus 100 Unit/ml Soln (Insulin glargine) .Marland KitchenMarland KitchenMarland KitchenMarland Kitchen 30 units inj [place] two times a day. dispense 4 vials    Lisinopril-hydrochlorothiazide 20-12.5 Mg Tabs (Lisinopril-hydrochlorothiazide) .Marland Kitchen... 2  tablets by mouth qday    Glyburide-metformin 2.5-500 Mg Tabs (Glyburide-metformin) ..... One tab by mouth bid  Orders: A1C-[facility] ([address])  Problem # 2:  HYPERTENSION, BENIGN SYSTEMIC (ICD-401.1) Assessment: Improved  patient interested in titrating off medications. will d/c carvedilol for now. patient to check BPs at home and bring recordings.   The following  medications were removed from the medication list:    Carvedilol 6.25 Mg Tabs (Carvedilol) .Marland Kitchen... 1 tab by mouth two times daily Her updated medication list for this problem includes:    Lisinopril-hydrochlorothiazide 20-12.5 Mg Tabs (Lisinopril-hydrochlorothiazide) .Marland Kitchen... 2  tablets by mouth qday  Orders: [facility]- Est  Level 4 (60454)  Problem # 3:  HYPERLIPIDEMIA (ICD-272.4) Assessment: Unchanged check direct LDL. d/c gemfibrozil. FLP at next visit.   The following medications were removed from the medication list:    Gemfibrozil 600 Mg Tabs (Gemfibrozil) .Marland Kitchen... Take 1 tablet by mouth twice a day Her updated medication list for this problem includes:    Crestor 20 Mg Tabs (Rosuvastatin calcium) .Marland Kitchen... 1 by mouth at bedtime  Orders: Direct LDL-[facility] ([address]11-91478) [facility]- Est  Level 4 (29562)  Patient Instructions: 1)  Keep up the GREAT work with your weight loss and exercise!!!! 2)  STOP your GEMFIBROZIL (CHOLESTEROL) 3)  STOP your CARVEDILOL (BLOOD PRESSURE) 4)  Make a follow up appointment with Dr. Lanier Prude for JUNE IN THE MORNING   Prevention & Chronic Care Immunizations   Influenza vaccine: Fluvax MCR  (08/11/2009)   Influenza vaccine deferral: Not available  (03/15/2009)   Influenza vaccine due: 06/01/2009    Tetanus booster: 10/01/2004: Done.   Tetanus booster due: 10/01/2014    Pneumococcal vaccine: Not documented   Pneumococcal vaccine deferral: Deferred  (08/11/2009)    H. zoster vaccine: Not documented   H. zoster vaccine deferral: Deferred  (08/11/2009)  Colorectal Screening   Hemoccult: not indicated  (08/21/2008)   Hemoccult due: Not Indicated    Colonoscopy: sessile polyps  (08/24/2007)   Colonoscopy due: 08/23/2010  Other Screening   Pap smear: pt with hysterectomy  (06/01/2008)   Pap smear due: Not Indicated    Mammogram: normal  (10/15/2007)   Mammogram action/deferral: Ordered  (08/11/2009)   Mammogram due: 10/14/2008    DXA bone density scan: Done.   (12/24/2004)   DXA scan due: None    Smoking status: never  (11/01/2009)  Diabetes Mellitus   HgbA1C: 7.1  (11/01/2009)   Hemoglobin A1C due: 09/01/2008    Eye exam: no diabetic changes  (09/26/2008)   Eye exam due: 09/26/2009    Foot exam: yes  (06/01/2008)   Foot exam action/deferral: Do today   High risk foot: No  (11/01/2009)   Foot care education: Done  (11/01/2009)   Foot exam due: 11/02/2010    Urine microalbumin/creatinine ratio: Not documented   Urine microalbumin/cr due: 06/01/2009    Diabetes flowsheet reviewed?: Yes   Progress toward A1C goal: Improved  Lipids   Total Cholesterol: 131  (10/13/2008)   LDL: 71  (10/13/2008)   LDL Direct: 79  (02/17/2008)   HDL: 43  (10/13/2008)   Triglycerides: 83  (10/13/2008)    SGOT (AST): 11  (08/11/2009)   SGPT (ALT): 8  (08/11/2009)   Alkaline phosphatase: 69  (08/11/2009)  Total bilirubin: 0.3  (08/11/2009)    Lipid flowsheet reviewed?: Yes   Progress toward LDL goal: Unchanged  Hypertension   Last Blood Pressure: 114 / 71  (11/01/2009)   Serum creatinine: 0.94  (08/11/2009)   Serum potassium 3.7  (08/11/2009)    Hypertension flowsheet reviewed?: Yes   Progress toward BP goal: At goal      Nursing Instructions: Diabetic foot exam today    Laboratory Results   Blood Tests     HGBA1C: 7.1%   (Normal Range: Non-Diabetic - 3-6%   Control Diabetic - 6-8%)      Appended Document: A1c  7.1%    Lab Visit  Laboratory Results   Blood Tests   Date/Time Received: November 01, 2009 2:37 PM  Date/Time Reported: November 01, 2009 3:44 PM   HGBA1C: 7.1%   (Normal Range: Non-Diabetic - 3-6%   Control Diabetic - 6-8%)  Comments: ...............test performed by......Marland KitchenBonnie A. [place], MLS (ASCP)cm    Orders Today:

## 2010-09-25 NOTE — Progress Notes (Signed)
  Phone Note Outgoing Call   Summary of Call: talked c pt and told her to stop zocor - will switch to crestor.  can't incrase zocor while on gemfibrazole.  crestor 20mg  daily.  [facility] [address]   Initial call taken by: Rolm Gala MD,  July 20, 2007 1:26 PM    New/Updated Medications: CRESTOR 20 MG  TABS (ROSUVASTATIN CALCIUM) 1 by mouth at bedtime   Prescriptions: CRESTOR 20 MG  TABS (ROSUVASTATIN CALCIUM) 1 by mouth at bedtime  #34 x 11   Entered and Authorized by:   Rolm Gala MD   Signed by:   Rolm Gala MD on 07/20/2007   Method used:   Electronically sent to ...       [facility]  [address] # [id number]*       [address]       [place], [place]  [address]       Ph: [phone] or [phone]       Fax: [phone]   RxID:   [id number]

## 2010-09-25 NOTE — Progress Notes (Signed)
Summary: lab results  Phone Note Outgoing Call   Call placed by: Lequita Asal  MD,  June 03, 2008 2:11 PM Details for Reason: lab results Summary of Call: informed patient of recent labwork and need to decrease synthroid. new rx sent to pharmacy. patient expressed understanding. states CBGs improved with two times a day dosing of lantus. Fasting this a.m. was 115. shoulder pain improved as well.  Initial call taken by: Lequita Asal  MD,  June 03, 2008 2:12 PM

## 2010-09-25 NOTE — Progress Notes (Signed)
Summary: Order  Phone Note Other Incoming Call back at [phone]   Call from: [facility] Summary of Call: checking status of order for diabetic supplies Initial call taken by: Haydee Salter,  November 27, 2006 11:00 AM  Follow-up for Phone Call        Spoke with Janey Greaser via phone didn't receive orders faxed on 10/29/06. Asked could we refax orders and this was done to new fax #[phone] Follow-up by: Dedra Skeens CMA,,  November 28, 2006 9:51 AM
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
## 2010-09-25 NOTE — Miscellaneous (Signed)
Summary: follow up  Clinical Lists Changes    follow up, no show.Loralee Pacas CMA  Dec 25, 2009 3:24 PM

## 2010-09-25 NOTE — Progress Notes (Signed)
Summary: [facility]  Phone Note Other Incoming Call back at [phone]   Call placed by: Shanda Bumps @ [facility] Summary of Call: Is wanting to make sure MD meant to put test 3 times a day instead of 4. Initial call taken by: Haydee Salter,  March 01, 2008 4:15 PM  Follow-up for Phone Call        I have no idea what she is referring to. Don't see a reference in Dr. Gavin Potters' notes. Please call and clarify. Follow-up by: Lequita Asal  MD,  March 01, 2008 6:46 PM  Additional Follow-up for Phone Call Additional follow up Details #1::        per OV notes pt is testing three times a day. left message for pt to call me to confirm. Additional Follow-up by: Golden Circle RN,  March 02, 2008 9:49 AM    Additional Follow-up for Phone Call Additional follow up Details #2::    Is returning call, can be reached at [phone] Follow-up by: Haydee Salter,  March 02, 2008 11:34 AM  Additional Follow-up for Phone Call Additional follow up Details #3:: Details for Additional Follow-up Action Taken: Pt states tests up to 3 times per day. called the company & confirmed three times a day testing Additional Follow-up by: Golden Circle RN,  March 02, 2008 11:36 AM

## 2010-09-25 NOTE — Assessment & Plan Note (Signed)
Summary: dm wp   Vital Signs:  Patient Profile:   65 Years Old Female Height:     67.5 inches (171.45 cm) Weight:      277.4 pounds (126.09 kg) BMI:     42.96 Temp:     97.5 degrees F (36.39 degrees C) Pulse rate:   79 / minute BP sitting:   184 / 95  (left arm)  Pt. in pain?   no  Vitals Entered By: Tomasa Rand (June 19, 2007 10:17 AM)              Is Patient Diabetic? Yes      PCP:  Adrian Blackwater  MD  Chief Complaint:  DM f/u.  History of Present Illness: diabetes - blood sugars 301 last night.  am=61-173.  mid 93-205, pm 100-301.  knows medications.  diet poor, no exercise.  HTN: No SOB, CP, edmema.  Tolerating medications well.  Knows medication regiment.  No side effects.  Has not gotten meds filled - metoprolol.  cholesterol - above goal at last check.      Diabetes Management History:      She is (or has been) enrolled in the "Diabetic Education Program".  She states understanding of dietary principles but she is not following the appropriate diet.  Sensory loss is noted.  Self foot exams are being performed.  She is checking home blood sugars.  She says that she is not exercising regularly.        Reported hypoglycemic symptoms include sweats.        There are no symptoms to suggest diabetic complications.  No changes have been made to her treatment plan since last visit.       Past Medical History:    Dyslipidemia (12/07),     Rt. Ankle injury w/ comp. Incl. Osteo.,     TSH 0.052 8/06,     urine prot/creat. 0.03 (,0.15) 8/06    Risk Factors:  Exercise:  no    Physical Exam  General:     Well-developed,well-nourished,in no acute distress; alert,appropriate and cooperative throughout examination Head:     Normocephalic and atraumatic without obvious abnormalities. No apparent alopecia or balding. Lungs:     Normal respiratory effort, chest expands symmetrically. Lungs are clear to auscultation, no crackles or wheezes. Heart:   Normal rate and regular rhythm. S1 and S2 normal without gallop, murmur, click, rub or other extra sounds. Abdomen:     Bowel sounds positive,abdomen soft and non-tender without masses, organomegaly or hernias noted. Extremities:     no edema.    Impression & Recommendations:  Problem # 1:  DIABETES MELLITUS II, UNCOMPLICATED (ICD-250.00) Assessment: Deteriorated will keep med regimen the same.  work on diet and exercise.  on asa.  on ace-i. Her updated medication list for this problem includes:    Bayer Childrens Aspirin 81 Mg Chew (Aspirin) .Marland Kitchen... Take 1 tablet by mouth once a day    Cozaar 100 Mg Tabs (Losartan potassium) .Marland Kitchen... Take one tab by mouth qday    Glipizide 10 Mg Tabs (Glipizide) .Marland Kitchen... Take 1/2 tablet daily    Lantus 100 Unit/ml Soln (Insulin glargine) .Marland Kitchen... 54 units inj  qhs    Lisinopril-hydrochlorothiazide 20-12.5 Mg Tabs (Lisinopril-hydrochlorothiazide) ..... One tablet by mouth qday    Glucophage 1000 Mg Tabs (Metformin hcl) .Marland Kitchen... Take 1 tablet twice daily  Orders: A1C-[facility] ([address]) [facility]- Est  Level 4 (60454)   Problem # 2:  HYPERLIPIDEMIA (ICD-272.4) Assessment: Deteriorated sill start zocor daily.  can't use max dose due to gemfibrozil. Her updated medication list for this problem includes:    Gemfibrozil 600 Mg Tabs (Gemfibrozil) .Marland Kitchen... Take 1 tablet by mouth twice a day    Simvastatin 10 Mg Tabs (Simvastatin) .Marland Kitchen... 1 by mouth at bedtime  Orders: [facility]- Est  Level 4 ([address])   Problem # 3:  HYPOTHYROIDISM, UNSPECIFIED (ICD-244.9) Assessment: Unchanged tsh last check in july. Her updated medication list for this problem includes:    Synthroid 137 Mcg Tabs (Levothyroxine sodium) ..... One tablet by mouth qday   Problem # 4:  HYPERTENSION, BENIGN SYSTEMIC (ICD-401.1) Assessment: Deteriorated stopped metorolp.  start coreg  - recommended in dm.  f/u 1 mo. The following medications were removed from the medication list:    Metoprolol Tartrate 100 Mg Tabs  (Metoprolol tartrate) ..... One tablet by mouth bid  Her updated medication list for this problem includes:    Cozaar 100 Mg Tabs (Losartan potassium) .Marland Kitchen... Take one tab by mouth qday    Lisinopril-hydrochlorothiazide 20-12.5 Mg Tabs (Lisinopril-hydrochlorothiazide) ..... One tablet by mouth qday    Coreg 12.5 Mg Tabs (Carvedilol) .Marland Kitchen... 1 by mouth bid  Orders: [facility]- Est  Level 4 ([address]454)   Complete Medication List: 1)  Bayer Childrens Aspirin 81 Mg Chew (Aspirin) .... Take 1 tablet by mouth once a day 2)  Cozaar 100 Mg Tabs (Losartan potassium) .... Take one tab by mouth qday 3)  Gemfibrozil 600 Mg Tabs (Gemfibrozil) .... Take 1 tablet by mouth twice a day 4)  Glipizide 10 Mg Tabs (Glipizide) .... Take 1/2 tablet daily 5)  Lantus 100 Unit/ml Soln (Insulin glargine) .... 54 units inj [place] qhs 6)  Lisinopril-hydrochlorothiazide 20-12.5 Mg Tabs (Lisinopril-hydrochlorothiazide) .... One tablet by mouth qday 7)  Synthroid 137 Mcg Tabs (Levothyroxine sodium) .... One tablet by mouth qday 8)  Glucophage 1000 Mg Tabs (Metformin hcl) .... Take 1 tablet twice daily 9)  Lancets Misc (Lancets) .... 3 lancets qday 10)  Coreg 12.5 Mg Tabs (Carvedilol) .Marland Kitchen.. 1 by mouth bid 11)  Simvastatin 10 Mg Tabs (Simvastatin) .Marland Kitchen.. 1 by mouth at bedtime  Diabetes Management Assessment/Plan:      The following lipid goals have been established for the patient: Total cholesterol goal of 200; LDL cholesterol goal of 100; HDL cholesterol goal of 40; Triglyceride goal of 200.  Her blood pressure goal is < 130/80.     Patient Instructions: 1)  blood pressure - stop metoprolol,, start coreg twice a day.  come back to clinic in 1 month for appt with me.  walk 10 minutes 3x next week; then walk 20 minutes 3x the next week; walk 30 minutes 3x from then on. (pedometer)  watch your salt.  eating 5 veggies a day. 2)  diabetes:  exercise, eat right. 3)  choleterol - goal is <70.  you are at 127.  stop eating eggs (1 egg  per day),  fried foods.  use olive oil for cooking.  simvastatin  4)  (zocor) 10mg  daily.  side effects: muscle aches (rare) if occurs, come back to clinic, stop medicine.  will check you liver. 5)  MAke an appoinemnt for 1 month and 3 months.    Prescriptions: LISINOPRIL-HYDROCHLOROTHIAZIDE 20-12.5 MG TABS (LISINOPRIL-HYDROCHLOROTHIAZIDE) one tablet by mouth qday  #34 x 6   Entered and Authorized by:   Rolm Gala MD   Signed by:   Rolm Gala MD on 06/19/2007   Method used:   Print then Give to Patient   RxID:  [id number] SIMVASTATIN 10 MG  TABS (SIMVASTATIN) 1 by mouth at bedtime  #34 x 11   Entered and Authorized by:   Rolm Gala MD   Signed by:   Rolm Gala MD on 06/19/2007   Method used:   Print then Give to Patient   RxID:   [id number] COREG 12.5 MG  TABS (CARVEDILOL) 1 by mouth bid  #64 x 11   Entered and Authorized by:   Rolm Gala MD   Signed by:   Rolm Gala MD on 06/19/2007   Method used:   Print then Give to Patient   RxID:   [id number] SIMVASTATIN 10 MG  TABS (SIMVASTATIN) 1 by mouth at bedtime  #34 x 11   Entered and Authorized by:   Rolm Gala MD   Signed by:   Rolm Gala MD on 06/19/2007   Method used:   Electronically sent to ...       [facility]  [address] [id number]*       [address]       [place], [place]  [address]       Ph: [phone] or [phone]       Fax: [phone]   RxID:   [id number] COREG 12.5 MG  TABS (CARVEDILOL) 1 by mouth bid  #64 x 11   Entered and Authorized by:   Rolm Gala MD   Signed by:   Rolm Gala MD on 06/19/2007   Method used:   Electronically sent to ...       [facility]  [address] [id number]*       [address]       [place], [place]  [address]       Ph: [phone] or [phone]       Fax: [phone]   RxID:   [id number]  ] Laboratory Results   Blood Tests   Date/Time Received: June 19, 2007 10:22 AM  Date/Time  Reported: June 19, 2007 11:23 AM   HGBA1C: 6.9%   (Normal Range: Non-Diabetic - 3-6%   Control Diabetic - 6-8%)  Comments: ...............test performed by......Marland KitchenBonnie A. [place], [place] (ASCP)      Appended Document: dm wp            Physical Exam  Extremities:     no edema.  dexreased sensation on right foot, pt says secondary to foot surgeries.      Complete Medication List: 1)  Bayer Childrens Aspirin 81 Mg Chew (Aspirin) .... Take 1 tablet by mouth once a day 2)  Cozaar 100 Mg Tabs (Losartan potassium) .... Take one tab by mouth qday 3)  Gemfibrozil 600 Mg Tabs (Gemfibrozil) .... Take 1 tablet by mouth twice a day 4)  Glipizide 10 Mg Tabs (Glipizide) .... Take 1/2 tablet daily 5)  Lantus 100 Unit/ml Soln (Insulin glargine) .... 44 units inj [place] qhs 6)  Lisinopril-hydrochlorothiazide 20-12.5 Mg Tabs (Lisinopril-hydrochlorothiazide) .... One tablet by mouth qday 7)  Synthroid 137 Mcg Tabs (Levothyroxine sodium) .... One tablet by mouth qday 8)  Glucophage 1000 Mg Tabs (Metformin hcl) .... Take 1 tablet twice daily 9)  Lancets Misc (Lancets) .... 3 lancets qday 10)  Coreg 12.5 Mg Tabs (Carvedilol) .Marland Kitchen.. 1 by mouth bid 11)  Simvastatin 10 Mg Tabs (Simvastatin) .Marland Kitchen.. 1 by mouth at bedtime     ]

## 2010-09-25 NOTE — Miscellaneous (Signed)
Summary: Medical Supply Form  Clinical Lists Changes [name] need form for receipt of  medical equip to help with mobility ...................................................................Kaitlyn Ferguson  July 17, 2007 3:02 PM    Nothing for Clinic staff to complete.  Placed in MDs box ...................................................................JESSICA FLEEGER CMA  July 17, 2007 3:11 PM  Please call patient and ask why she needs the heating pad and where on her body she needs it please. thanks! Left message to call back....................................................................ANITA HERBERT RN  July 17, 2007 4:17 PM   Pt sts that she needs the heating pad for her back.  Advised I would let MD know ...................................................................JESSICA [facility] CMA  July 20, 2007 12:15 PM

## 2010-09-25 NOTE — Letter (Signed)
Summary: Generic Letter  [facility]  [address]   [place], [place] [address]   Phone: [phone]  Fax: [phone]    10/13/2007 MRN: [id number]  [address] [place], [place]  [address]  Dear Ms. Boateng,  Your electrolytes and liver function was normal.    Sodium                    143 mEq/L                   135-145   Potassium                 3.9 mEq/L                   3.5-5.3   Chloride                  104 mEq/L                   96-112   CO2                       26 mEq/L                    19-32   Glucose                  111 mg/dL                   52-841   BUN                       28 mg/dL                    3-24   Creatinine                0.87 mg/dL                  0.40-1.20   Bilirubin, Total          0.3 mg/dL                   4.0-1.0   Alkaline Phosphatase      65 U/L                      39-117   AST/SGOT                  10 U/L                      0-37   ALT/SGPT                  <8 U/L                      0-35   Total Protein             6.6 g/dL                    2.7-2.5   Albumin                   4.2 g/dL                    3.6-6.4   Calcium  9.8 mg/dL                   4.2-59.5  Your "bad cholesterol" was at a good level.  Your goal is less than 100. Tests: (2) LDL, Direct ([address])   LDL, Direct               81 mg/dL    Sincerely,     Rolm Gala MD [facility]

## 2010-09-25 NOTE — Assessment & Plan Note (Signed)
Summary: f/u DM, HTN, low TSH   Vital Signs:  Patient Profile:   65 Years Old Female Height:     67.5 inches (171.45 cm) Weight:      270 pounds Temp:     98.6 degrees F Pulse rate:   45 / minute BP sitting:   115 / 76  (left arm)  Pt. in pain?   yes    Location:   shoulders    Intensity:   5    Type:       aching  Vitals Entered ByJacki Cones RN (August 12, 2008 11:32 AM)                  PCP:  Lequita Asal  MD  Chief Complaint:  f/u DM, TSH, and HTN.  History of Present Illness: patient is 65 y/o female with h/o DM, HTN, hypothyroidism, HLD, who presents for routine f/u.  DM- patient denies any sxs of hypo- or hyperglycemia. does report decreased compliance with diabetic diet. Has had to increase lantus up to 60 units two times a day.   HTN- denies CP, peripheral edema, DOE, palpitations. still works out, but not as much as before.   hypothyroidism- denies cold intolerance, fatigue, weight gain, or other sequelae of untreated hypothyroidism. synthroid dose decreased at last visit 2/2 low TSH.   Shoulder pain- h/o biceps tendinitis in past several months. patient refused steroid injection. some continued pain. not impairing function.     Current Allergies: No known allergies   Past Medical History:    Rt. Ankle injury w/ complications including osteomyelitis    Hypothyroidism     Hyperlipidemia    Hypertension    Last Hemoccult Result: Done. (07/26/2006 12:00:00 AM) Hemoccult Result Date:  08/21/2008 Hemoccult Result:  not indicated Hemoccult Next Due:  Not Indicated    Physical Exam  General:     NAD, obese, alert. Neck:     No deformities, masses, or tenderness noted. Lungs:     Normal respiratory effort, chest expands symmetrically. Lungs are clear to auscultation, no crackles or wheezes. Heart:     Normal rate and regular rhythm. S1 and S2 normal without gallop, murmur, click, rub or other extra sounds. Abdomen:     Bowel sounds  positive,abdomen soft and non-tender without masses, organomegaly or hernias noted. Msk:     Shoulder: Inspection reveals no abnormalities, atrophy or asymmetry. Palpation shows no tenderness along clavicle or AC joint.   + TTP over bicipital groove. ROM is full in all planes.      Pulses:     2+ distal pulses Extremities:     no edema    Impression & Recommendations:  Problem # 1:  HYPOTHYROIDISM, UNSPECIFIED (ICD-244.9) Assessment: Comment Only will recheck TSH today to make sure synthroid dose is appropriate.   Her updated medication list for this problem includes:    Levothyroxine Sodium 112 Mcg Tabs (Levothyroxine sodium) .Marland Kitchen... 1 by mouth daily  Orders: TSH-[facility] (04540-98119) [facility]- Est Level  3 (14782)   Problem # 2:  DIABETES MELLITUS II, UNCOMPLICATED (ICD-250.00) Assessment: Deteriorated patient not currently due for A1C. counseled on improving diet and continuing exercises. patient to continue current dosing of lantus and check sugars routinely. continue antihypertensive meds.  Her updated medication list for this problem includes:    Bayer Childrens Aspirin 81 Mg Chew (Aspirin) .Marland Kitchen... Take 1 tablet by mouth once a day    Lantus 100 Unit/ml Soln (Insulin glargine) .Marland KitchenMarland KitchenMarland KitchenMarland Kitchen 60 units inj [place]  two times a day. dispense 4 vials    Lisinopril-hydrochlorothiazide 20-12.5 Mg Tabs (Lisinopril-hydrochlorothiazide) .Marland Kitchen... 2  tablets by mouth qday  Orders: [facility]- Est Level  3 ([address]109)   Problem # 3:  HYPERTENSION (ICD-401.9) Assessment: Improved continue current mgmt.   Her updated medication list for this problem includes:    Lisinopril-hydrochlorothiazide 20-12.5 Mg Tabs (Lisinopril-hydrochlorothiazide) .Marland Kitchen... 2  tablets by mouth qday    Carvedilol 6.25 Mg Tabs (Carvedilol) .Marland Kitchen... 1 tab by mouth two times daily  Orders: [facility]- Est Level  3 (60454)   Problem # 4:  BICEPS TENDINITIS, LEFT (ICD-726.12) Assessment: Comment Only continue pain meds  Complete Medication  List: 1)  Bayer Childrens Aspirin 81 Mg Chew (Aspirin) .... Take 1 tablet by mouth once a day 2)  Gemfibrozil 600 Mg Tabs (Gemfibrozil) .... Take 1 tablet by mouth twice a day 3)  Lantus 100 Unit/ml Soln (Insulin glargine) .... 60 units inj [place] two times a day. dispense 4 vials 4)  Lisinopril-hydrochlorothiazide 20-12.5 Mg Tabs (Lisinopril-hydrochlorothiazide) .... 2  tablets by mouth qday 5)  Levothyroxine Sodium 112 Mcg Tabs (Levothyroxine sodium) .Marland Kitchen.. 1 by mouth daily 6)  Lancets Misc (Lancets) .... 3 lancets qday 7)  Carvedilol 6.25 Mg Tabs (Carvedilol) .Marland Kitchen.. 1 tab by mouth two times daily 8)  Crestor 20 Mg Tabs (Rosuvastatin calcium) .Marland Kitchen.. 1 by mouth at bedtime 9)  Triamcinolone Acetonide 0.1 % Crea (Triamcinolone acetonide) .... Aaa bid 10)  Permethrin 5 % Crea (Permethrin) .... Apply from head to toe and leave on for 8-14 hours then wash off. was all bedding and clothing.  reapply in 1 week as needed. 1 large tub 11)  Insulin Syringe Ult Thin Short 30g X 5/16" 1 Ml Misc (Insulin syringe-needle u-100) .... Use as directed. 12)  Mobic 15 Mg Tabs (Meloxicam) .Marland Kitchen.. 1 tablet by mouth daily for 2 weeks, then as needed for pain.  Other Orders: Comp Met-[facility] [id number])   Patient Instructions: 1)  Take carvedilol 1/2 tablet twice a day until it runs out, then take new prescription 2)  Work on making sure you eat properly to control your sugars.  3)  Follow up with Dr. Lanier Prude in 4-6 weeks.   4)  It is important that you exercise regularly at least 20 minutes 5 times a week. If you develop chest pain, have severe difficulty breathing, or feel very tired , stop exercising immediately and seek medical attention.   Prescriptions: LANCETS   MISC (LANCETS) 3 lancets qday  #90 x 3   Entered and Authorized by:   Lequita Asal  MD   Signed by:   Lequita Asal  MD on 08/12/2008   Method used:   Electronically to        [facility]  [address] [id number]* (retail)       [address]        [place], [place]  [address]       Ph: [phone] or [phone]       Fax: [phone]   RxID:   [id number] INSULIN SYRINGE ULT THIN SHORT 30G X 5/16" 1 ML MISC (INSULIN SYRINGE-NEEDLE U-100) Use as directed.  #100 x 12   Entered and Authorized by:   Lequita Asal  MD   Signed by:   Lequita Asal  MD on 08/12/2008   Method used:   Electronically to        [facility]  [address] # [id number]* (retail)  [address]       [place], [place]  [address]       Ph: [phone] or [phone]       Fax: [phone]   RxID:   [id number] CARVEDILOL 6.25 MG  TABS (CARVEDILOL) 1 tab by mouth two times daily  #60 x 11   Entered and Authorized by:   Lequita Asal  MD   Signed by:   Lequita Asal  MD on 08/12/2008   Method used:   Electronically to        [facility]  [address] [id number]* (retail)       [address]       [place], [place]  [address]       Ph: [phone] or [phone]       Fax: [phone]   RxID:   [id number] LANTUS 100 UNIT/ML SOLN (INSULIN GLARGINE) 60 units inj [place] two times a day. dispense 4 vials  #4 x 3   Entered and Authorized by:   Lequita Asal  MD   Signed by:   Lequita Asal  MD on 08/12/2008   Method used:   Electronically to        [facility]  [address] [id number]* (retail)       [address]       [place], [place]  [address]       Ph: [phone] or [phone]       Fax: [phone]   RxID:   [id number] CRESTOR 20 MG  TABS (ROSUVASTATIN CALCIUM) 1 by mouth at bedtime  #30 x 11   Entered and Authorized by:   Lequita Asal  MD   Signed by:   Lequita Asal  MD on 08/12/2008   Method used:   Electronically to        [facility]  [address] [id number]* (retail)       [address]       [place], [place]  [address]       Ph: [phone] or [phone]       Fax: [phone]   RxID:    [id number] LISINOPRIL-HYDROCHLOROTHIAZIDE 20-12.5 MG TABS (LISINOPRIL-HYDROCHLOROTHIAZIDE) 2  tablets by mouth qday  #60 x 11   Entered and Authorized by:   Lequita Asal  MD   Signed by:   Lequita Asal  MD on 08/12/2008   Method used:   Electronically to        [facility]  [address] [id number]* (retail)       [address]       [place], [place]  [address]       Ph: [phone] or [phone]       Fax: [phone]   RxID:   [id number] GEMFIBROZIL 600 MG TABS (GEMFIBROZIL) Take 1 tablet by mouth twice a day  #60 x 11   Entered and Authorized by:   Lequita Asal  MD   Signed by:   Lequita Asal  MD on 08/12/2008   Method used:   Electronically to        [facility]  [address] [id number]* (retail)       [address]       [place], [place]  [address]  Ph: [phone] or [phone]       Fax: [phone]   RxID:   [id number]  ]

## 2010-09-25 NOTE — Progress Notes (Signed)
Summary: pt needs lab visit.Marland Kitchensee message/TS  Phone Note Call from Patient Call back at Home Phone [phone]   Caller: Patient Call For: [phone] Summary of Call: Kaitlyn Ferguson has been waiting for refills since Monday.  The pharmacy was suppose to have faxed request.  She need Lisinipril-Hctz,Levlthyroxin and Crestor called into [facility] in [place]. Initial call taken by: Britta Mccreedy mcgregor  Follow-up for Phone Call        All sent in, lisinopril/hctz is new dosage and once a day now.  She NEEDS to come have her TSH checked with just a lab visit.  Orders in. Follow-up by: Rodney Langton MD,  May 10, 2010 12:15 PM  Additional Follow-up for Phone Call Additional follow up Details #1::        called pt several times...number busy. Additional Follow-up by: Arlyss Repress CMA,,  May 10, 2010 3:49 PM    Additional Follow-up for Phone Call Additional follow up Details #2::    called pt and was not able to lvm. phone rang until busy signal  Follow-up by: Loralee Pacas CMA,  May 11, 2010 9:58 AM  Additional Follow-up for Phone Call Additional follow up Details #3:: Details for Additional Follow-up Action Taken: spoke with patient and gave her message from MD. states she needs to come in Oct first  for diabtetes follow up .  appointment scheduled and she will have TSH checked at that time. Additional Follow-up by: Theresia Lo RN,  May 11, 2010 3:50 PM  New/Updated Medications: LISINOPRIL-HYDROCHLOROTHIAZIDE 20-25 MG TABS (LISINOPRIL-HYDROCHLOROTHIAZIDE) One tab by mouth daily Prescriptions: CRESTOR 20 MG  TABS (ROSUVASTATIN CALCIUM) 1 by mouth at bedtime  #30 x 0   Entered and Authorized by:   Rodney Langton MD   Signed by:   Rodney Langton MD on 05/10/2010   Method used:   Electronically to        [facility]  [address] (retail)       [address]       [place], [place]  [address]       Ph: [phone]       Fax:  [phone]   RxID:   [id number] LISINOPRIL-HYDROCHLOROTHIAZIDE 20-25 MG TABS (LISINOPRIL-HYDROCHLOROTHIAZIDE) One tab by mouth daily  #30 x 0   Entered and Authorized by:   Rodney Langton MD   Signed by:   Rodney Langton MD on 05/10/2010   Method used:   Electronically to        [facility]  [address] (retail)       [address]       [place], [place]  [address]       Ph: [phone]       Fax: [phone]   RxID:   [id number] LEVOTHYROXINE SODIUM 112 MCG  TABS (LEVOTHYROXINE SODIUM) 1 by mouth daily  #30 x 0   Entered and Authorized by:   Rodney Langton MD   Signed by:   Rodney Langton MD on 05/10/2010   Method used:   Electronically to        [facility]  [address] (retail)       [address]       [place], [place]  [address]       Ph: [phone]       Fax: [phone]   RxID:   [id number]

## 2010-09-25 NOTE — Progress Notes (Signed)
Summary: refill  Phone Note Refill Request Call back at Home Phone [phone] Message from:  Patient  Refills Requested: Medication #1:  LISINOPRIL-HYDROCHLOROTHIAZIDE 20-25 MG TABS One tab by mouth daily  Medication #2:  CRESTOR 20 MG  TABS 1 by mouth at bedtime  Medication #3:  LEVOTHYROXINE SODIUM 112 MCG  TABS 1 by mouth daily [facility] [place]  Initial call taken by: De Nurse,  July 12, 2010 2:16 PM    Prescriptions: CRESTOR 20 MG  TABS (ROSUVASTATIN CALCIUM) 1 by mouth at bedtime  #90 x 0   Entered and Authorized by:   Rodney Langton MD   Signed by:   Rodney Langton MD on 07/12/2010   Method used:   Electronically to        [facility] [address] (retail)       [address]       [place], [place]  [address]       Ph: [phone]       Fax: [phone]   RxID:   [id number] LEVOTHYROXINE SODIUM 112 MCG  TABS (LEVOTHYROXINE SODIUM) 1 by mouth daily  #90 x 0   Entered and Authorized by:   Rodney Langton MD   Signed by:   Rodney Langton MD on 07/12/2010   Method used:   Electronically to        [facility] [address] (retail)       [address]       [place], [place]  [address]       Ph: [phone]       Fax: [phone]   RxID:   [id number] LISINOPRIL-HYDROCHLOROTHIAZIDE 20-25 MG TABS (LISINOPRIL-HYDROCHLOROTHIAZIDE) One tab by mouth daily  #90 x 0   Entered and Authorized by:   Rodney Langton MD   Signed by:   Rodney Langton MD on 07/12/2010   Method used:   Electronically to        [facility] [address] (retail)       [address]       [place], [place]  [address]       Ph: [phone]       Fax: [phone]   RxID:   [id number]

## 2010-09-25 NOTE — Assessment & Plan Note (Signed)
Summary: f/u dm,tcb   Vital Signs:  Patient profile:   65 year old female Weight:      284.2 pounds BMI:     44.01 Temp:     98.2 degrees F oral Pulse rate:   64 / minute BP sitting:   135 / 73  (right arm) Cuff size:   large  Vitals Entered By: Loralee Pacas CMA (August 11, 2009 3:43 PM)  Nutrition Counseling: Patient's BMI is greater than 25 and therefore counseled on weight management options. counseling performed by Lequita Asal  MD  August 11, 2009 4:37 PM  Primary Care Provider:  Lequita Asal  MD  CC:  f/u DM, HTN, HLD, hypothyroidism, and continued rash.  History of Present Illness: patient is 65 y/o female with h/o DM, HTN, hypothyroidism, HLD, who presents for routine f/u.  DM- patient denies any sxs of hypo- or hyperglycemia. does report decreased compliance with diabetic diet. on lantus up to 60 units two times a day. CBG log reviewed: most fasting CBGs 70s-100s; bedtime CBGs in 200s-300s.   HTN- denies CP, peripheral edema, DOE, palpitations. has stopped working out. planning on resuming in 2011. on lisinopril, hctz, coreg.   hypothyroidism- denies cold intolerance, fatigue, weight gain, or other sequelae of untreated hypothyroidism. on levothyroxine.   HLD- on crestor. no myalgias or other side effects.   "spots on leg." - several months in duration. more areas are appearing. quarter sized erythematous areas on bilateral calves. nontender, nonpruritic. denies any trauma, insect exposure, bruising elsewhere  Habits & Providers  Alcohol-Tobacco-Diet     Alcohol drinks/day: 0     Tobacco Status: never     Tobacco Counseling: not indicated; no tobacco use  Current Medications (verified): 1)  Bayer Childrens Aspirin 81 Mg Chew (Aspirin) .... Take 1 Tablet By Mouth Once A Day 2)  Gemfibrozil 600 Mg Tabs (Gemfibrozil) .... Take 1 Tablet By Mouth Twice A Day 3)  Lantus 100 Unit/ml Soln (Insulin Glargine) .... 60 Units Inj [place] Two Times A Day. Dispense 4  Vials 4)  Lisinopril-Hydrochlorothiazide 20-12.5 Mg Tabs (Lisinopril-Hydrochlorothiazide) .... 2  Tablets By Mouth Qday 5)  Levothyroxine Sodium 112 Mcg  Tabs (Levothyroxine Sodium) .Marland Kitchen.. 1 By Mouth Daily 6)  Carvedilol 6.25 Mg  Tabs (Carvedilol) .Marland Kitchen.. 1 Tab By Mouth Two Times Daily 7)  Crestor 20 Mg  Tabs (Rosuvastatin Calcium) .Marland Kitchen.. 1 By Mouth At Bedtime 8)  Mobic 15 Mg Tabs (Meloxicam) .Marland Kitchen.. 1 Tablet By Mouth Daily For 2 Weeks, Then As Needed For Pain. 9)  Voltaren 1 % Gel (Diclofenac Sodium) .... Apply 2g To Shoulder or 4 G To Knees Qid As Needed For Pain. Dispense 100 G. 10)  Insulin Syringe Ult Thin Short 30g X 5/16" 1 Ml Misc (Insulin Syringe-Needle U-100) .... Use As Directed. 11)  Lancets   Misc (Lancets) .... 3 Lancets Qday 12)  Glyburide-Metformin 2.5-500 Mg Tabs (Glyburide-Metformin) .... One Tab By Mouth Bid  Allergies (verified): No Known Drug Allergies  Physical Exam  General:  NAD, obese, alert. vitals reviewed.  Eyes:  L eye exotropia. EOMI, PERRLA. no jaundice Ears:  hearing grossly normal Mouth:  MMM Neck:  no JVD or thyromegaly Lungs:  Normal respiratory effort, chest expands symmetrically. Lungs are clear to auscultation, no crackles or wheezes. Heart:  Normal rate and regular rhythm. S1 and S2 normal without gallop, murmur, click, rub or other extra sounds. Abdomen:  obese, NT, ND, +BS Pulses:  2+ periph pulses bilaterally;  Extremities:  1+ edema RLE; no  edema of LLE.  Skin:  several areas of erythema with ?central clearing and surrounding petechiae on bilateral calves. blanchable.  some with petechiae with lacy erythematous appearance. denies tenderness with palpation, no induration, warmth.    Impression & Recommendations:  Problem # 1:  DIABETES MELLITUS, TYPE II, UNCONTROLLED (ICD-250.02) Assessment Deteriorated  patient opposed to addition of short acting insulin injection. will restart oral meds. patient to start exercising in earnest. f/u in 3 months.    Her updated medication list for this problem includes:    Bayer Childrens Aspirin 81 Mg Chew (Aspirin) .Marland Kitchen... Take 1 tablet by mouth once a day    Lantus 100 Unit/ml Soln (Insulin glargine) .Marland KitchenMarland KitchenMarland KitchenMarland Kitchen 60 units inj [place] two times a day. dispense 4 vials    Lisinopril-hydrochlorothiazide 20-12.5 Mg Tabs (Lisinopril-hydrochlorothiazide) .Marland Kitchen... 2  tablets by mouth qday    Glyburide-metformin 2.5-500 Mg Tabs (Glyburide-metformin) ..... One tab by mouth bid  Orders: A1C-[facility] (54098) [facility]- Est  Level 4 (11914)  Problem # 2:  SKIN RASH (ICD-782.1) Assessment: Deteriorated  etiology unclear. patient opposed to biopsy. monitor  The following medications were removed from the medication list:    Triamcinolone Acetonide 0.1 % Crea (Triamcinolone acetonide) .Marland Kitchen... Aaa bid  Orders: [facility]- Est  Level 4 (78295)  Problem # 3:  HYPERTENSION, BENIGN SYSTEMIC (ICD-401.1) Assessment: Improved  no changes.   Her updated medication list for this problem includes:    Lisinopril-hydrochlorothiazide 20-12.5 Mg Tabs (Lisinopril-hydrochlorothiazide) .Marland Kitchen... 2  tablets by mouth qday    Carvedilol 6.25 Mg Tabs (Carvedilol) .Marland Kitchen... 1 tab by mouth two times daily  Orders: Comp Met-[facility] (62130-86578) [facility]- Est  Level 4 (46962)  Problem # 4:  HYPOTHYROIDISM, UNSPECIFIED (ICD-244.9) Assessment: Unchanged  check TSH today  Her updated medication list for this problem includes:    Levothyroxine Sodium 112 Mcg Tabs (Levothyroxine sodium) .Marland Kitchen... 1 by mouth daily  Orders: TSH-[facility] ([address]244) [facility]- Est  Level 4 (01027)  Problem # 5:  HYPERLIPIDEMIA (ICD-272.4) Assessment: Unchanged continue crestor. check transaminases. repeat FLP at next visit.   Her updated medication list for this problem includes:    Gemfibrozil 600 Mg Tabs (Gemfibrozil) .Marland Kitchen... Take 1 tablet by mouth twice a day    Crestor 20 Mg Tabs (Rosuvastatin calcium) .Marland Kitchen... 1 by mouth at bedtime  Other Orders: Mammogram (Screening) (Mammo) Influenza Vaccine  MCR (25366) Prescriptions: MOBIC 15 MG TABS (MELOXICAM) 1 tablet by mouth daily for 2 weeks, then as needed for pain.  #30 x 5   Entered and Authorized by:   Lequita Asal  MD   Signed by:   Lequita Asal  MD on 08/11/2009   Method used:   Electronically to        [id number]* (retail)       [address]       [place], [place]  [address]       Ph: [phone] or [phone]       Fax: [phone]   RxID:   [id number] CRESTOR 20 MG  TABS (ROSUVASTATIN CALCIUM) 1 by mouth at bedtime  #30 Tablet x 5   Entered and Authorized by:   Lequita Asal  MD   Signed by:   Lequita Asal  MD on 08/11/2009   Method used:   Electronically to        [id number]* (retail)       [address]  [place], [place]  [address]       Ph: [phone] or [phone]       Fax: [phone]   RxID:   [id number] CARVEDILOL 6.25 MG  TABS (CARVEDILOL) 1 tab by mouth two times daily  #60 Tablet x 5   Entered and Authorized by:   Lequita Asal  MD   Signed by:   Lequita Asal  MD on 08/11/2009   Method used:   Electronically to        [id number]* (retail)       [address]       [place], [place]  [address]       Ph: [phone] or [phone]       Fax: [phone]   RxID:   [id number] LEVOTHYROXINE SODIUM 112 MCG  TABS (LEVOTHYROXINE SODIUM) 1 by mouth daily  #30 Tablet x 5   Entered and Authorized by:   Lequita Asal  MD   Signed by:   Lequita Asal  MD on 08/11/2009   Method used:   Electronically to        [id number]* (retail)       [address]       [place], [place]  [address]       Ph: [phone] or [phone]       Fax: [phone]   RxID:   [id number] LISINOPRIL-HYDROCHLOROTHIAZIDE 20-12.5 MG TABS (LISINOPRIL-HYDROCHLOROTHIAZIDE) 2  tablets by mouth qday  #60 x 5   Entered and Authorized  by:   Lequita Asal  MD   Signed by:   Lequita Asal  MD on 08/11/2009   Method used:   Electronically to        [id number]* (retail)       [address]       [place], [place]  [address]       Ph: [phone] or [phone]       Fax: [phone]   RxID:   [id number] GEMFIBROZIL 600 MG TABS (GEMFIBROZIL) Take 1 tablet by mouth twice a day  #60 x 5   Entered and Authorized by:   Lequita Asal  MD   Signed by:   Lequita Asal  MD on 08/11/2009   Method used:   Electronically to        [id number]* (retail)       [address]       [place], [place]  [address]       Ph: [phone] or [phone]       Fax: [phone]   RxID:   [id number] GLYBURIDE-METFORMIN 2.5-500 MG TABS (GLYBURIDE-METFORMIN) one tab by mouth bid  #60 x 5   Entered and Authorized by:   Lequita Asal  MD   Signed by:   Lequita Asal  MD on 08/11/2009   Method used:   Electronically to        [id number]* (retail)       [address]       [place], [place]  [address]       Ph: [phone] or [phone]  Fax: [phone]   RxID:   [id number]    Prevention & Chronic Care Immunizations   Influenza vaccine: Fluvax MCR  (08/11/2009)   Influenza vaccine deferral: Not available  (03/15/2009)   Influenza vaccine due: 06/01/2009    Tetanus booster: 10/01/2004: Done.   Tetanus booster due: 10/01/2014    Pneumococcal vaccine: Not documented   Pneumococcal vaccine deferral: Deferred  (08/11/2009)    H. zoster vaccine: Not documented   H. zoster vaccine deferral: Deferred  (08/11/2009)  Colorectal Screening   Hemoccult: not indicated  (08/21/2008)   Hemoccult due: Not Indicated    Colonoscopy: sessile polyps  (08/24/2007)   Colonoscopy due: 08/23/2010  Other Screening   Pap smear: pt with hysterectomy  (06/01/2008)   Pap smear due: Not Indicated     Mammogram: normal  (10/15/2007)   Mammogram action/deferral: Ordered  (08/11/2009)   Mammogram due: 10/14/2008    DXA bone density scan: Done.  (12/24/2004)   DXA scan due: None    Smoking status: never  (08/11/2009)  Diabetes Mellitus   HgbA1C: 8.6  (08/11/2009)   Hemoglobin A1C due: 09/01/2008    Eye exam: no diabetic changes  (09/26/2008)   Eye exam due: 09/26/2009    Foot exam: yes  (06/01/2008)   High risk foot: Not documented   Foot care education: completed  (06/01/2008)   Foot exam due: 10/11/2008    Urine microalbumin/creatinine ratio: Not documented   Urine microalbumin/cr due: 06/01/2009    Diabetes flowsheet reviewed?: Yes   Progress toward A1C goal: At goal  Lipids   Total Cholesterol: 131  (10/13/2008)   LDL: 71  (10/13/2008)   LDL Direct: 79  (02/17/2008)   HDL: 43  (10/13/2008)   Triglycerides: 83  (10/13/2008)    SGOT (AST): 13  (01/30/2009)   SGPT (ALT): 9  (01/30/2009) CMP ordered    Alkaline phosphatase: 55  (01/30/2009)   Total bilirubin: 0.3  (01/30/2009)    Lipid flowsheet reviewed?: Yes   Progress toward LDL goal: At goal  Hypertension   Last Blood Pressure: 135 / 73  (08/11/2009)   Serum creatinine: 0.91  (01/30/2009)   Serum potassium 4.2  (01/30/2009) CMP ordered     Hypertension flowsheet reviewed?: Yes   Progress toward BP goal: At goal  Self-Management Support :   Personal Goals (by the next clinic visit) :     Personal A1C goal: 7  (08/11/2009)     Personal blood pressure goal: 130/80  (05/10/2009)     Personal LDL goal: 100  (05/10/2009)    Patient will work on the following items until the next clinic visit to reach self-care goals:     Medications and monitoring: take my medicines every day, check my blood sugar, examine my feet every day  (08/11/2009)     Activity: take a 30 minute walk every day  (08/11/2009)    Diabetes self-management support: Copy of home glucose meter record, CBG self-monitoring log, Written  self-care plan  (08/11/2009)   Diabetes care plan printed   Last diabetes self-management training by diabetes educator: completed    Hypertension self-management support: Written self-care plan, Pre-printed educational material  (08/11/2009)   Hypertension self-care plan printed.    Lipid self-management support: Written self-care plan  (05/10/2009)     Lipid self-management support not done because: Good outcomes  (08/11/2009)   Nursing Instructions: Give Flu vaccine today Schedule screening mammogram (see order)    Immunizations Administered:  Influenza Vaccine # 1:    Vaccine  Type: Fluvax MCR    Site: right deltoid    Mfr: GlaxoSmithKline    Dose: 0.5 ml    Route: IM    Given by: Loralee Pacas CMA    Exp. Date: 02/22/2010    Lot #: [id number]    VIS given: 04/02/2009  Flu Vaccine Consent Questions:    Do you have a history of severe allergic reactions to this vaccine? no    Any prior history of allergic reactions to egg and/or gelatin? no    Do you have a sensitivity to the preservative Thimersol? no    Do you have a past history of Guillan-Barre Syndrome? no    Do you currently have an acute febrile illness? no    Have you ever had a severe reaction to latex? no    Vaccine information given and explained to patient? yes    Are you currently pregnant? no   Laboratory Results   Blood Tests   Date/Time Received: August 11, 2009 3:44 PM  Date/Time Reported: August 11, 2009 4:25 PM   HGBA1C: 8.6%   (Normal Range: Non-Diabetic - 3-6%   Control Diabetic - 6-8%)  Comments: ...........test performed by..........Marland KitchenLoralee Pacas, CMA entered by Terese Door, CMA

## 2010-09-25 NOTE — Assessment & Plan Note (Signed)
Summary: f/u ED for Gallstones/[place]/T   Vital Signs:  Patient profile:   65 year old female Weight:      267.2 pounds Temp:     98.1 degrees F oral Pulse rate:   81 / minute Pulse rhythm:   regular BP sitting:   117 / 74  (left arm) Cuff size:   large  Vitals Entered By: Loralee Pacas CMA (February 23, 2010 8:37 AM) CC: follow-up visit ed/gallstones   Primary Care Provider:  Lequita Asal  MD  CC:  follow-up visit ed/gallstones.  History of Present Illness: Micah Flesher to the [facility]) on Wednesday (2 days ago) for nausea and fever and adominal pain. Given IV meds for nausea and antiobiotics. Took x-rays of chest that she thinks were negative. Did ultrasound of gallbladder found to have gallstones. Referred to surgeon but wanted to talk to PCP first and get a referral from PCP's office. Given Rx for Cipro to take at home and Norco for pain. Pt has not had to take any pain meds. Never had any vomiting. Still having nausea. Pt did have 5-6 episodes of diarrhea on Wednesday morning but that completely resolved before she went to the hospital and has not returned.   Notes from ED papers recieved after patient had already left and referral already done:  Pt hd AST of 50, Direct Bili was low at <0.1, GLu 179, BUN 19, WBC 15.3, TropI <0.02, TSH 0.735, blood culture NGTD, CXR neg,  Abd [place]: cholelithiasis. No gallbladder distention. Gallbladder was thickness is normal. The common bile duct diameter is normal. Gallstones were noted, nonmobile, in the neck of the gallbladder.   Current Medications (verified): 1)  Bayer Childrens Aspirin 81 Mg Chew (Aspirin) .... Take 1 Tablet By Mouth Once A Day 2)  Lantus 100 Unit/ml Soln (Insulin Glargine) .... 30 Units Inj [place] Two Times A Day. Dispense 4 Vials 3)  Lisinopril-Hydrochlorothiazide 20-12.5 Mg Tabs (Lisinopril-Hydrochlorothiazide) .... 2  Tablets By Mouth Qday 4)  Levothyroxine Sodium 112 Mcg  Tabs (Levothyroxine Sodium) .Marland Kitchen.. 1  By Mouth Daily 5)  Crestor 20 Mg  Tabs (Rosuvastatin Calcium) .Marland Kitchen.. 1 By Mouth At Bedtime 6)  Mobic 15 Mg Tabs (Meloxicam) .Marland Kitchen.. 1 Tablet By Mouth Daily For 2 Weeks, Then As Needed For Pain. 7)  Insulin Syringe Ult Thin Short 30g X 5/16" 1 Ml Misc (Insulin Syringe-Needle U-100) .... Use As Directed. 8)  Lancets   Misc (Lancets) .... 3 Lancets Qday 9)  Glyburide-Metformin 2.5-500 Mg Tabs (Glyburide-Metformin) .... One Tab By Mouth Bid 10)  Triamcinolone Acetonide 0.1 % Oint (Triamcinolone Acetonide) .... Apply To Affected Areas Twice A Day. Disp 60 G. 11)  Nexium 20 Mg Cpdr (Esomeprazole Magnesium) .... Take 1 Tablet Every Day. 12)  Gas Relief 80 Mg Chew (Simethicone) .Marland Kitchen.. 1 Tablet 4 Times A Day6 For Belching and Gas Pressure. Take After Meals and At Bedtime. 13)  Promethazine Hcl 25 Mg Tabs (Promethazine Hcl) .... Take 1 Tablet Every 6 Hours As Needed For Nausea 14)  Cipro 500 Mg Tabs (Ciprofloxacin Hcl) .... Take 1 Tab Two Times A Day X 7 Days 15)  Norco 5-325 Mg Tabs (Hydrocodone-Acetaminophen) .... As Needed For Pain  Allergies (verified): No Known Drug Allergies  Past History:  Past Surgical History: Back surg. For ruptured disc (May, 1985) Hysterectomy (8/87)  fibroids  Neck mass - to surgery - 12/29/2001 Rt. Ankle tib/talar fusion (7/97) Rt. Breast bx (10/98) fatty necrosis, no malig Rt. Carpal tunnel release (`93) Tonsillectomy -  as a child  Family History: Diabetes 1st degree- brother Sister had DM2, lost weight, now ok Father first MI in early 47`s Mother died 12 yo of Multiple Myeloma, h/o CVA`s, CHF children healthy  Review of Systems        vitals reviewed and pertinent negatives and positives seen in HPI   Physical Exam  General:  Well-developed,well-nourished,in no acute distress; alert,appropriate and cooperative throughout examination Abdomen:  minimal tenderness over RUQ. Some minimal tenderness over the LLQ as well.    Impression &  Recommendations:  Problem # 1:  CHOLELITHIASIS (ICD-574.20) Assessment New Pt was symptomatic a few days ago and went to the hospital on 02-21-10. She was having abd pain, Nausea and diarrhea. These symptoms have mostly stopped (except some nausea with eating) but she wants to talk to a surgeon about having her gallbladder taken out. She has been asymptomatic until this time in her life. Plan to refer her to surgery, give her Gas-x for the belching and start her on a PPI. Will see her back after she talks to a surgeon if there is anything else she needs.   Orders: Surgical Referral (Surgery) [facility]- Est Level  3 ([address]69)  Complete Medication List: 1)  Bayer Childrens Aspirin 81 Mg Chew (Aspirin) .... Take 1 tablet by mouth once a day 2)  Lantus 100 Unit/ml Soln (Insulin glargine) .... 30 units inj [place] two times a day. dispense 4 vials 3)  Lisinopril-hydrochlorothiazide 20-12.5 Mg Tabs (Lisinopril-hydrochlorothiazide) .... 2  tablets by mouth qday 4)  Levothyroxine Sodium 112 Mcg Tabs (Levothyroxine sodium) .Marland Kitchen.. 1 by mouth daily 5)  Crestor 20 Mg Tabs (Rosuvastatin calcium) .Marland Kitchen.. 1 by mouth at bedtime 6)  Mobic 15 Mg Tabs (Meloxicam) .Marland Kitchen.. 1 tablet by mouth daily for 2 weeks, then as needed for pain. 7)  Insulin Syringe Ult Thin Short 30g X 5/16" 1 Ml Misc (Insulin syringe-needle u-100) .... Use as directed. 8)  Lancets Misc (Lancets) .... 3 lancets qday 9)  Glyburide-metformin 2.5-500 Mg Tabs (Glyburide-metformin) .... One tab by mouth bid 10)  Triamcinolone Acetonide 0.1 % Oint (Triamcinolone acetonide) .... Apply to affected areas twice a day. disp 60 g. 11)  Nexium 20 Mg Cpdr (Esomeprazole magnesium) .... Take 1 tablet every day. 12)  Gas Relief 80 Mg Chew (Simethicone) .Marland Kitchen.. 1 tablet 4 times a day6 for belching and gas pressure. take after meals and at bedtime. 13)  Promethazine Hcl 25 Mg Tabs (Promethazine hcl) .... Take 1 tablet every 6 hours as needed for nausea 14)  Cipro 500 Mg Tabs  (Ciprofloxacin hcl) .... Take 1 tab two times a day x 7 days 15)  Norco 5-325 Mg Tabs (Hydrocodone-acetaminophen) .... As needed for pain  Patient Instructions: 1)  We are doing a referral for you to talk to the surgeons about your gallbladder.  2)  Keep taking your antibiotic until it is gone.  3)  I have sent in 2 prescriptions for you.  4)  Make an appointment to be seen after your surgery appointment to discuss the plans with Dr. Karie Schwalbe.  5)  It was nice to meet you.  Prescriptions: PROMETHAZINE HCL 25 MG TABS (PROMETHAZINE HCL) take 1 tablet every 6 hours as needed for nausea  #90 x 1   Entered and Authorized by:   Jamie Brookes MD   Signed by:   Jamie Brookes MD on 02/23/2010   Method used:   Electronically to        [facility] (  retail)       [address]       [place], [place]  [address]       Ph: [phone]       Fax: [phone]   RxID:   [id number] GAS RELIEF 80 MG CHEW (SIMETHICONE) 1 tablet 4 times a day6 for belching and gas pressure. Take after meals and at bedtime.  #120 x 1   Entered and Authorized by:   Jamie Brookes MD   Signed by:   Jamie Brookes MD on 02/23/2010   Method used:   Electronically to        [facility] (retail)       [address]       [place], [place]  [address]       Ph: [phone]       Fax: [phone]   RxID:   [id number] NEXIUM 20 MG CPDR (ESOMEPRAZOLE MAGNESIUM) take 1 tablet every day.  #31 x 1   Entered and Authorized by:   Jamie Brookes MD   Signed by:   Jamie Brookes MD on 02/23/2010   Method used:   Electronically to        [facility] (retail)       [address]       [place], [place]  [address]       Ph: [phone]       Fax: [phone]   RxID:   [id number]

## 2010-09-25 NOTE — Assessment & Plan Note (Signed)
Summary: blood sugars wp   Vital Signs:  Patient Profile:   65 Years Old Female Height:     67.5 inches (171.45 cm) Weight:      272.0 pounds Temp:     97.1 degrees F Pulse rate:   67 / minute BP sitting:   144 / 59  (left arm)  Pt. in pain?   yes    Location:   arthritis  Vitals Entered By: Alphia Kava (December 11, 2007 3:32 PM)              Is Patient Diabetic? Yes      PCP:  Adrian Blackwater  MD  Chief Complaint:  f/u.  History of Present Illness: 1. dm - blood sugars "off the wall."  am - 236,  lunch - 281 pm 467 -  what has changed?  stress. husband dying.  eating has not changed.   no cugh, fever, foot wounds, dizziness. currently taking lantus 40 units.  on acei, asa  2. anxiety - husband in NH x4 years.  has PD.  got feeding tube.  pulled out x2.  little sleep.  terminal - last days now.          Physical Exam  General:     Well-developed,well-nourished,in no acute distress; alert,appropriate and cooperative throughout examination Psych:     Oriented X3, memory intact for recent and remote, normally interactive, and good eye contact.  almost tearful at times.    Impression & Recommendations:  Problem # 1:  DIABETES MELLITUS II, UNCOMPLICATED (ICD-250.00) Increase your Lantus to 48.  Call me on Monday and tell me your fasting sugars over teh weekend.  will likely need to increase  during increased stress.   Her updated medication list for this problem includes:    Bayer Childrens Aspirin 81 Mg Chew (Aspirin) .Marland Kitchen... Take 1 tablet by mouth once a day    Glipizide 10 Mg Tabs (Glipizide) .Marland Kitchen... Take 1/2 tablet daily    Lantus 100 Unit/ml Soln (Insulin glargine) .Marland KitchenMarland KitchenMarland KitchenMarland Kitchen 44 units inj [place] qhs    Lisinopril-hydrochlorothiazide 20-12.5 Mg Tabs (Lisinopril-hydrochlorothiazide) .Marland Kitchen... 2  tablets by mouth qday    Glucophage 1000 Mg Tabs (Metformin hcl) .Marland Kitchen... Take 1 tablet twice daily  Orders: [facility]- Est Level  3 (62952)   Complete Medication List: 1)  Bayer  Childrens Aspirin 81 Mg Chew (Aspirin) .... Take 1 tablet by mouth once a day 2)  Gemfibrozil 600 Mg Tabs (Gemfibrozil) .... Take 1 tablet by mouth twice a day 3)  Glipizide 10 Mg Tabs (Glipizide) .... Take 1/2 tablet daily 4)  Lantus 100 Unit/ml Soln (Insulin glargine) .... 44 units inj [place] qhs 5)  Lisinopril-hydrochlorothiazide 20-12.5 Mg Tabs (Lisinopril-hydrochlorothiazide) .... 2  tablets by mouth qday 6)  Synthroid 137 Mcg Tabs (Levothyroxine sodium) .... One tablet by mouth qday 7)  Glucophage 1000 Mg Tabs (Metformin hcl) .... Take 1 tablet twice daily 8)  Lancets Misc (Lancets) .... 3 lancets qday 9)  Coreg 12.5 Mg Tabs (Carvedilol) .Marland Kitchen.. 1 by mouth bid 10)  Crestor 20 Mg Tabs (Rosuvastatin calcium) .Marland Kitchen.. 1 by mouth at bedtime   Patient Instructions: 1)  Increase your Lantus to 48.  Call me on Monday and tell me your fasting sugars over teh weekend. 2)  Let yourself win sometimes.  Be proud of what you are doing.      ]

## 2010-09-25 NOTE — Assessment & Plan Note (Signed)
Summary: dm wp   Vital Signs:  Patient Profile:   65 Years Old Female Height:     67.5 inches (171.45 cm) Weight:      275.1 pounds BMI:     42.60 Temp:     97.8 degrees F oral Pulse rate:   62 / minute BP sitting:   140 / 76  (left arm)  Pt. in pain?   no  Vitals Entered By: Garen Grams LPN (October 12, 2007 10:07 AM)                Vision Comments: 09/2008   PCP:  Adrian Blackwater  MD  Chief Complaint:  f/u on diabetes.  History of Present Illness: 65 yo F  1. DM - high 218 (when eats fried food), low 77 fasting.  taking metformin two times a day, lantus 34 units.  wants to cut out pills.  saw optho last week.  checks feet daily.  got new dm shoes.    HTN: No shortness of breath, chest pain, edema.  Tolerating medications well.  Does not knows medication regimen.  No side effects.  cholesterol - no muscle aches.  taking s problems.   Diabetes Management History:      She is (or has been) enrolled in the "Diabetic Education Program".  She states understanding of dietary principles but she is not following the appropriate diet.  No sensory loss is reported.  Self foot exams are being performed.  She is checking home blood sugars.  She says that she is not exercising regularly.           Physical Exam  General:     Well-developed,well-nourished,in no acute distress; alert,appropriate and cooperative throughout examination Lungs:     Normal respiratory effort, chest expands symmetrically. Lungs are clear to auscultation, no crackles or wheezes. Heart:     Normal rate and regular rhythm. S1 and S2 normal without gallop, murmur, click, rub or other extra sounds. Extremities:     no edema  Diabetes Management Exam:    Foot Exam (with socks and/or shoes not present):       Sensory-Pinprick/Light touch:          Left medial foot (L-4): normal          Left dorsal foot (L-5): normal          Left lateral foot (S-1): normal          Right medial foot  (L-4): normal          Right dorsal foot (L-5): normal          Right lateral foot (S-1): normal       Sensory-Monofilament:          Left foot: normal          Right foot: normal       Inspection:          Left foot: normal          Right foot: normal       Nails:          Left foot: normal          Right foot: normal    Eye Exam:       Eye Exam done elsewhere          Date: 10/05/2007          Results: normal    Impression & Recommendations:  Problem # 1:  DIABETES MELLITUS II, UNCOMPLICATED (ICD-250.00) Assessment:  Unchanged see pt instructions. Her updated medication list for this problem includes:    Bayer Childrens Aspirin 81 Mg Chew (Aspirin) .Marland Kitchen... Take 1 tablet by mouth once a day    Cozaar 100 Mg Tabs (Losartan potassium) .Marland Kitchen... Take one tab by mouth qday    Glipizide 10 Mg Tabs (Glipizide) .Marland Kitchen... Take 1/2 tablet daily    Lantus 100 Unit/ml Soln (Insulin glargine) .Marland KitchenMarland KitchenMarland KitchenMarland Kitchen 44 units inj [place] qhs    Lisinopril-hydrochlorothiazide 20-12.5 Mg Tabs (Lisinopril-hydrochlorothiazide) ..... One tablet by mouth qday    Glucophage 1000 Mg Tabs (Metformin hcl) .Marland Kitchen... Take 1 tablet twice daily  Orders: A1C-[facility] ([address]) [facility]- Est  Level 4 (60454)   Problem # 2:  HYPERTENSION, BENIGN SYSTEMIC (ICD-401.1) Assessment: Unchanged see pt instructiosn.  ?stop ARB and increase coreg or lisinopril? Her updated medication list for this problem includes:    Cozaar 100 Mg Tabs (Losartan potassium) .Marland Kitchen... Take one tab by mouth qday    Lisinopril-hydrochlorothiazide 20-12.5 Mg Tabs (Lisinopril-hydrochlorothiazide) ..... One tablet by mouth qday    Coreg 12.5 Mg Tabs (Carvedilol) .Marland Kitchen... 1 by mouth bid  Orders: [facility]- Est  Level 4 (09811)   Problem # 3:  HYPERLIPIDEMIA (ICD-272.4) Assessment: Unchanged check labs Her updated medication list for this problem includes:    Gemfibrozil 600 Mg Tabs (Gemfibrozil) .Marland Kitchen... Take 1 tablet by mouth twice a day    Crestor 20 Mg Tabs (Rosuvastatin calcium) .Marland Kitchen...  1 by mouth at bedtime  Orders: Comp Met-[facility] [phone]) Direct LDL-[facility] [phone]) [facility]- Est  Level 4 ([address])   Complete Medication List: 1)  Bayer Childrens Aspirin 81 Mg Chew (Aspirin) .... Take 1 tablet by mouth once a day 2)  Cozaar 100 Mg Tabs (Losartan potassium) .... Take one tab by mouth qday 3)  Gemfibrozil 600 Mg Tabs (Gemfibrozil) .... Take 1 tablet by mouth twice a day 4)  Glipizide 10 Mg Tabs (Glipizide) .... Take 1/2 tablet daily 5)  Lantus 100 Unit/ml Soln (Insulin glargine) .... 44 units inj  qhs 6)  Lisinopril-hydrochlorothiazide 20-12.5 Mg Tabs (Lisinopril-hydrochlorothiazide) .... One tablet by mouth qday 7)  Synthroid 137 Mcg Tabs (Levothyroxine sodium) .... One tablet by mouth qday 8)  Glucophage 1000 Mg Tabs (Metformin hcl) .... Take 1 tablet twice daily 9)  Lancets Misc (Lancets) .... 3 lancets qday 10)  Coreg 12.5 Mg Tabs (Carvedilol) .Marland Kitchen.. 1 by mouth bid 11)  Crestor 20 Mg Tabs (Rosuvastatin calcium) .Marland Kitchen.. 1 by mouth at bedtime  Diabetes Management Assessment/Plan:      The following lipid goals have been established for the patient: Total cholesterol goal of 200; LDL cholesterol goal of 100; HDL cholesterol goal of 40; Triglyceride goal of 200.  Her blood pressure goal is < 130/80.     Patient Instructions: 1)  Diabetes - Stop Metformin - take your last dose tonight.  Eat a snack before bed  - apple.  Then tonight, take 38 units of Lantus.  Call me on Wednesay - leave a note with nurses about what your fasting sugars have been and what your sugar is 2 hours after lunch (2-3pm).  Will check your HbA1C. 2)  Cholesterol - Let's check your "bad cholesterol" and your liver functions.   3)  Blood pressure - bring in your medicines next week.      ] Laboratory Results   Blood Tests   Date/Time Received: October 12, 2007 10:51 AM  Date/Time Reported: October 12, 2007 10:51 AM   HGBA1C: 6.9%   (Normal Range: Non-Diabetic - 3-6%  Control Diabetic -  6-8%)  Comments: ...........test performed by............Marland KitchenBAJordan, MT(ASCP)10:51 AM ...................................................................DONNA [facility]  October 12, 2007 10:51 AM

## 2010-09-25 NOTE — Miscellaneous (Signed)
Summary: [facility] order- [facility] supplies  orders completed to test three times a day.  Lequita Asal  MD  September 05, 2009 1:25 PM

## 2010-09-25 NOTE — Procedures (Signed)
Summary: Colonoscopy:sessile polyps x4  <1cm, repeat in 3y.  hemmerhoids.  Hardcopy in MD box.

## 2010-09-25 NOTE — Progress Notes (Signed)
Summary: Bx Res  Phone Note Call from Patient Call back at Home Phone [phone]   Caller: Patient Summary of Call: Checking on results of bx from last week. Initial call taken by: Clydell Hakim,  December 20, 2009 10:27 AM  Follow-up for Phone Call        will forward to PCP. Follow-up by: Theresia Lo RN,  December 20, 2009 12:31 PM  Additional Follow-up for Phone Call Additional follow up Details #1::        attempted to call back. no answer. bx results "lymphocytic spongiotic dermatitis." unable to find a lot of information about possible causes. not dangerous. rx for steroid ointment sent to pharmacy. would try that, and then refer to derm if no improvement.  Additional Follow-up by: Lequita Asal  MD,  December 20, 2009 1:54 PM    New/Updated Medications: TRIAMCINOLONE ACETONIDE 0.1 % OINT (TRIAMCINOLONE ACETONIDE) apply to affected areas twice a day. disp 60 g. Prescriptions: TRIAMCINOLONE ACETONIDE 0.1 % OINT (TRIAMCINOLONE ACETONIDE) apply to affected areas twice a day. disp 60 g.  #1 x 2   Entered and Authorized by:   Lequita Asal  MD   Signed by:   Lequita Asal  MD on 12/20/2009   Method used:   Electronically to        [facility] (retail)       [address]       [place], [place]  [address]       Ph: [phone]       Fax: [phone]   RxID:   [id number]  attempted to call , no answer. Theresia Lo RN  December 20, 2009 4:36 PM  Appended Document: Bx Res again tried to call , no answer. will  have to await call back from  patient.

## 2010-09-25 NOTE — Progress Notes (Signed)
Summary: Blood sugars  Phone Note Call from Patient Call back at [facility] Phone [phone]   Summary of Call: Pt is requesting to speak with a nurse about her blood sugars readings from over the weekend. Initial call taken by: Haydee Salter,  December 15, 2007 9:29 AM  Follow-up for Phone Call        sat 252, sun 243, mon 214. states md wanted her to call numbers in.  discussed calorie count of fat vs. other foods and keeping a food diary to pinpoint trouble areas. pt stated she would try this. Follow-up by: Golden Circle RN,  December 15, 2007 9:32 AM  Additional Follow-up for Phone Call Additional follow up Details #1::        called patient.   above numbers were fasting.  will increase lantus from 48 to 56.  she will call in next few days if bs >180 and will adjust, otherwise f/u in clinic. Additional Follow-up by: Rolm Gala MD,  December 15, 2007 1:32 PM    New/Updated Medications: LANTUS 100 UNIT/ML SOLN (INSULIN GLARGINE) 56 units inj  qhs

## 2010-09-25 NOTE — Progress Notes (Signed)
Summary: blood pressure rx  Phone Note Call from Patient Call back at Home Phone [phone]   Reason for Call: Talk to Nurse Summary of Call: pt picked up her last refill on her blood pressure medication, she sts she was just here last week and she needs more refills called in, pt goes to [facility] Initial call taken by: ERIN LEVAN,  March 26, 2007 9:23 AM  Follow-up for Phone Call        Phone Call Completed, Rx Called In Follow-up by: Towana Badger MD,  March 26, 2007 11:24 AM  Additional Follow-up for Phone Call Additional follow up Details #1::        pt informed Additional Follow-up by: [facility] CMA,  March 26, 2007 11:45 AM      Prescriptions: METOPROLOL TARTRATE 100 MG TABS (METOPROLOL TARTRATE) one tablet by mouth bid  #68 x 6   Entered and Authorized by:   Towana Badger MD   Signed by:   Towana Badger MD on 03/26/2007   Method used:   Electronically sent to ...       [facility] [address]       [address]       [place], [place]  [address]       Ph: [phone] or [phone]       Fax: [phone]   RxID:   [id number] LISINOPRIL-HYDROCHLOROTHIAZIDE 20-12.5 MG TABS (LISINOPRIL-HYDROCHLOROTHIAZIDE) one tablet by mouth qday  #34 x 6   Entered and Authorized by:   Towana Badger MD   Signed by:   Towana Badger MD on 03/26/2007   Method used:   Electronically sent to ...       [facility] [address]       [address]       [place], [place]  [address]       Ph: [phone] or [phone]       Fax: [phone]   RxID:   [id number] COZAAR 100 MG TABS (LOSARTAN POTASSIUM) take one tab by mouth qday  #34 x 6   Entered and Authorized by:   Towana Badger MD   Signed by:   Towana Badger MD on 03/26/2007   Method used:   Electronically sent to ...       [facility] [address]       [address]       [place], [place]  [address]       Ph: [phone] or [phone]       Fax: [phone]   RxID:    [id number]

## 2010-09-25 NOTE — Progress Notes (Signed)
Summary: Triage  Phone Note Call from Patient Call back at Home Phone [phone]   Summary of Call: Is requesting to be seen for a painful lump under arm. Initial call taken by: Haydee Salter,  June 23, 2008 8:52 AM  Follow-up for Phone Call        Pt noticed a hard lump in her arm pit last night.  States it is causing discomfort in her upper body.  Not draining.  Scheduled her for appt this afternoon. Follow-up by: AMY MARTIN RN,  June 23, 2008 9:21 AM

## 2010-09-25 NOTE — Assessment & Plan Note (Signed)
Summary: patient sumamry    PCP:  Adrian Blackwater  MD   History of Present Illness:   2. anxiety - husband in [place] x4 years.  has PD.  got feeding tube.  pulled out x2.  little sleep.  terminal - last days now.            Impression & Recommendations:  Problem # 1:  DIABETES MELLITUS II, UNCOMPLICATED (ICD-250.00) 1. dm - blood sugars "off the wall."  am - 236,  lunch - 281 pm 467 -  what has changed?  stress. husband dying.  eating has not changed.   no cugh, fever, foot wounds, dizziness. currently taking lantus 40 units.  on acei, asa  2. anxiety - husband in [place] x4 years.  has PD.  got feeding tube.  pulled out x2.  little sleep.  terminal - last days now.  Her updated medication list for this problem includes:    Bayer Childrens Aspirin 81 Mg Chew (Aspirin) .Marland Kitchen... Take 1 tablet by mouth once a day    Glipizide 10 Mg Tabs (Glipizide) .Marland Kitchen... Take 1/2 tablet daily    Lantus 100 Unit/ml Soln (Insulin glargine) .Marland Kitchen... 56 units inj [place] qhs    Lisinopril-hydrochlorothiazide 20-12.5 Mg Tabs (Lisinopril-hydrochlorothiazide) .Marland Kitchen... 2  tablets by mouth qday    Glucophage 1000 Mg Tabs (Metformin hcl) .Marland Kitchen... Take 1 tablet twice daily   Problem # 2:  HYPERLIPIDEMIA (ICD-272.4) low HDL so on gemfibrozil.  Need to follow CMET closely on both these meds.    cardiac cath 10/04 noraml.  should have repeat w/u soon.   Her updated medication list for this problem includes:    Gemfibrozil 600 Mg Tabs (Gemfibrozil) .Marland Kitchen... Take 1 tablet by mouth twice a day    Crestor 20 Mg Tabs (Rosuvastatin calcium) .Marland Kitchen... 1 by mouth at bedtime   Problem # 3:  SPECIAL SCREENING FOR MALIGNANT NEOPLASMS COLON (ICD-V76.51) needs repeat colonoscopy in 3 years.  Problem # 4:  HYPOTHYROIDISM, UNSPECIFIED (ICD-244.9) Check TSH next visit. Her updated medication list for this problem includes:    Synthroid 137 Mcg Tabs (Levothyroxine sodium) ..... One tablet by mouth qday   Complete Medication List: 1)   Bayer Childrens Aspirin 81 Mg Chew (Aspirin) .... Take 1 tablet by mouth once a day 2)  Gemfibrozil 600 Mg Tabs (Gemfibrozil) .... Take 1 tablet by mouth twice a day 3)  Glipizide 10 Mg Tabs (Glipizide) .... Take 1/2 tablet daily 4)  Lantus 100 Unit/ml Soln (Insulin glargine) .... 56 units inj  qhs 5)  Lisinopril-hydrochlorothiazide 20-12.5 Mg Tabs (Lisinopril-hydrochlorothiazide) .... 2  tablets by mouth qday 6)  Synthroid 137 Mcg Tabs (Levothyroxine sodium) .... One tablet by mouth qday 7)  Glucophage 1000 Mg Tabs (Metformin hcl) .... Take 1 tablet twice daily 8)  Lancets Misc (Lancets) .... 3 lancets qday 9)  Coreg 12.5 Mg Tabs (Carvedilol) .Marland Kitchen.. 1 by mouth bid 10)  Crestor 20 Mg Tabs (Rosuvastatin calcium) .Marland Kitchen.. 1 by mouth at bedtime    ]

## 2010-09-25 NOTE — Miscellaneous (Signed)
Summary: SUMMARY   Current Problems (verified): 1)  Skin Rash  (ICD-782.1) 2)  Diabetes Mellitus, Type II, Controlled  (ICD-250.00) 3)  Hepatotoxicity, Drug-induced, Risk of  (ICD-V58.69) 4)  Shoulder Pain, Left, Chronic  (ICD-719.41) 5)  Hyperlipidemia  (ICD-272.4) 6)  Obesity, Nos  (ICD-278.00) 7)  Hypothyroidism  (ICD-244.9) 8)  Hypertension, Benign Systemic  (ICD-401.1) 9)  Arthritis, Degenerative  (ICD-715.09)  Current Medications (verified): 1)  Bayer Childrens Aspirin 81 Mg Chew (Aspirin) .... Take 1 Tablet By Mouth Once A Day 2)  Lantus 100 Unit/ml Soln (Insulin Glargine) .... 30 Units Inj [place] Two Times A Day. Dispense 4 Vials 3)  Lisinopril-Hydrochlorothiazide 20-12.5 Mg Tabs (Lisinopril-Hydrochlorothiazide) .... 2  Tablets By Mouth Qday 4)  Levothyroxine Sodium 112 Mcg  Tabs (Levothyroxine Sodium) .Marland Kitchen.. 1 By Mouth Daily 5)  Crestor 20 Mg  Tabs (Rosuvastatin Calcium) .Marland Kitchen.. 1 By Mouth At Bedtime 6)  Mobic 15 Mg Tabs (Meloxicam) .Marland Kitchen.. 1 Tablet By Mouth Daily For 2 Weeks, Then As Needed For Pain. 7)  Insulin Syringe Ult Thin Short 30g X 5/16" 1 Ml Misc (Insulin Syringe-Needle U-100) .... Use As Directed. 8)  Lancets   Misc (Lancets) .... 3 Lancets Qday 9)  Glyburide-Metformin 2.5-500 Mg Tabs (Glyburide-Metformin) .... One Tab By Mouth Bid 10)  Triamcinolone Acetonide 0.1 % Oint (Triamcinolone Acetonide) .... Apply To Affected Areas Twice A Day. Disp 60 G.  Allergies (verified): No Known Drug Allergies   Past History:  Past medical, surgical, family and social histories (including risk factors) reviewed, and no changes noted (except as noted below).  Past Medical History: Reviewed history from 10/07/2008 and no changes required. Rt. Ankle injury w/ complications including osteomyelitis Hypothyroidism  Hyperlipidemia Hypertension DJD (Added 10/07/2008) Diabetes mellitus, type II (Added 10/07/2008)  Past Surgical History: Back surg. For ruptured disc  Hysterectomy (8/87)   fibroids  Neck mass - to surgery - 12/29/2001 Rt. Ankle tib/talar fusion (7/97) Rt. Breast bx (10/98) fatty necrosis, no malig Rt. Carpal tunnel release (`93) Tonsillectomy -   Family History: Reviewed history from 10/23/2006 and no changes required. Diabetes 1st degree- brother, Father first MI in early 3`s, Mother died 78 yo of Multiple Myeloma, h/o CVA`s, CHF  Social History: Reviewed history from 10/07/2008 and no changes required. [place].  Widowed February 2010. Has 6 children, 14 grandchildren. Works in the school system. No tobacco, no etoh.  Grandson living with pt.   Impression & Recommendations:  Problem # 1:  DIABETES MELLITUS, TYPE II, CONTROLLED (ICD-250.00) Assessment Comment Only  greatly improved control. patient open to changes when necessary, trying to increase amt of exercise she gets.   Her updated medication list for this problem includes:    Bayer Childrens Aspirin 81 Mg Chew (Aspirin) .Marland Kitchen... Take 1 tablet by mouth once a day    Lantus 100 Unit/ml Soln (Insulin glargine) .Marland KitchenMarland KitchenMarland KitchenMarland Kitchen 30 units inj [place] two times a day. dispense 4 vials    Lisinopril-hydrochlorothiazide 20-12.5 Mg Tabs (Lisinopril-hydrochlorothiazide) .Marland Kitchen... 2  tablets by mouth qday    Glyburide-metformin 2.5-500 Mg Tabs (Glyburide-metformin) ..... One tab by mouth bid  Labs Reviewed: Creat: 0.94 (08/11/2009)   Microalbumin: normal (06/01/2008)  Last Eye Exam: no diabetic changes (09/26/2008) Reviewed HgBA1c results: 7.1 (11/01/2009)  7.1 (11/01/2009)  Problem # 2:  SKIN RASH (ICD-782.1) Assessment: Comment Only bx wasn't helpful. patient developed cellulits/abscess, then missed f/u appt. if persists, would refer to derm.  Her updated medication list for this problem includes:    Triamcinolone Acetonide 0.1 % Oint (Triamcinolone  acetonide) .Marland Kitchen... Apply to affected areas twice a day. disp 60 g.  Problem # 3:  SHOULDER PAIN, LEFT, CHRONIC (ICD-719.41) Assessment: Comment Only patient  adamantly opposed to injections. has tried oral NSAIDs and exercises.   Her updated medication list for this problem includes:    Bayer Childrens Aspirin 81 Mg Chew (Aspirin) .Marland Kitchen... Take 1 tablet by mouth once a day    Mobic 15 Mg Tabs (Meloxicam) .Marland Kitchen... 1 tablet by mouth daily for 2 weeks, then as needed for pain.  Problem # 4:  HYPERLIPIDEMIA (ICD-272.4) Assessment: Comment Only repeat FLP at next visit.   Her updated medication list for this problem includes:    Crestor 20 Mg Tabs (Rosuvastatin calcium) .Marland Kitchen... 1 by mouth at bedtime  Labs Reviewed: SGOT: 11 (08/11/2009)   SGPT: 8 (08/11/2009)  Lipid Goals: Chol Goal: 200 (06/01/2008)   HDL Goal: 40 (06/01/2008)   LDL Goal: 100 (06/01/2008)   TG Goal: 150 (06/01/2008)  Prior 10 Yr Risk Heart Disease: Not enough information (03/18/2007)   HDL:43 (10/13/2008)  LDL:71 (10/13/2008)  Chol:131 (10/13/2008)  Trig:83 (10/13/2008)  Problem # 5:  HYPOTHYROIDISM (ICD-244.9) Assessment: Comment Only  Her updated medication list for this problem includes:    Levothyroxine Sodium 112 Mcg Tabs (Levothyroxine sodium) .Marland Kitchen... 1 by mouth daily  Labs Reviewed: TSH: 0.941 (08/11/2009)    HgBA1c: 7.1 (11/01/2009) Chol: 131 (10/13/2008)   HDL: 43 (10/13/2008)   LDL: 71 (10/13/2008)   TG: 83 (10/13/2008)  Problem # 6:  HYPERTENSION, BENIGN SYSTEMIC (ICD-401.1)  Her updated medication list for this problem includes:    Lisinopril-hydrochlorothiazide 20-12.5 Mg Tabs (Lisinopril-hydrochlorothiazide) .Marland Kitchen... 2  tablets by mouth qday  Prior BP: 102/66 (12/22/2009)  Prior 10 Yr Risk Heart Disease: Not enough information (03/18/2007)  Labs Reviewed: K+: 3.7 (08/11/2009) Creat: : 0.94 (08/11/2009)   Chol: 131 (10/13/2008)   HDL: 43 (10/13/2008)   LDL: 71 (10/13/2008)   TG: 83 (10/13/2008)   Prevention & Chronic Care Immunizations   Influenza vaccine: Fluvax MCR  (08/11/2009)   Influenza vaccine deferral: Not available  (03/15/2009)   Influenza vaccine  due: 06/01/2009    Tetanus booster: 10/01/2004: Done.   Td booster deferral: Not indicated  (02/06/2010)   Tetanus booster due: 10/01/2014    Pneumococcal vaccine: Not documented   Pneumococcal vaccine deferral: Deferred  (08/11/2009)    H. zoster vaccine: Not documented   H. zoster vaccine deferral: Deferred  (08/11/2009)  Colorectal Screening   Hemoccult: not indicated  (08/21/2008)   Hemoccult due: Not Indicated    Colonoscopy: sessile polyps  (08/24/2007)   Colonoscopy due: 08/23/2010  Other Screening   Pap smear: pt with hysterectomy  (06/01/2008)   Pap smear due: Not Indicated    Mammogram: normal  (10/15/2007)   Mammogram action/deferral: Ordered  (08/11/2009)   Mammogram due: 10/14/2008    DXA bone density scan: Done.  (12/24/2004)   DXA scan due: None    Smoking status: never  (12/22/2009)  Diabetes Mellitus   HgbA1C: 7.1  (11/01/2009)   Hemoglobin A1C due: 09/01/2008    Eye exam: no diabetic changes  (09/26/2008)   Eye exam due: 09/26/2009    Foot exam: yes  (06/01/2008)   Foot exam action/deferral: Do today   High risk foot: No  (11/01/2009)   Foot care education: Done  (11/01/2009)   Foot exam due: 11/02/2010    Urine microalbumin/creatinine ratio: Not documented   Urine microalbumin/cr due: 06/01/2009    Diabetes flowsheet reviewed?: Yes   Progress toward  A1C goal: At goal  Lipids   Total Cholesterol: 131  (10/13/2008)   LDL: 71  (10/13/2008)   LDL Direct: 168  (11/01/2009)   HDL: 43  (10/13/2008)   Triglycerides: 83  (10/13/2008)    SGOT (AST): 11  (08/11/2009)   SGPT (ALT): 8  (08/11/2009)   Alkaline phosphatase: 69  (08/11/2009)   Total bilirubin: 0.3  (08/11/2009)    Lipid flowsheet reviewed?: Yes   Progress toward LDL goal: Deteriorated  Hypertension   Last Blood Pressure: 102 / 66  (12/22/2009)   Serum creatinine: 0.94  (08/11/2009)   Serum potassium 3.7  (08/11/2009)    Hypertension flowsheet reviewed?: Yes   Progress  toward BP goal: At goal  Self-Management Support :   Personal Goals (by the next clinic visit) :     Personal A1C goal: 7  (08/11/2009)     Personal blood pressure goal: 130/80  (05/10/2009)     Personal LDL goal: 100  (05/10/2009)    Diabetes self-management support: Copy of home glucose meter record, CBG self-monitoring log, Written self-care plan  (11/01/2009)    Diabetes self-management support not done because: Good outcomes  (02/06/2010)   Last diabetes self-management training by diabetes educator: completed    Hypertension self-management support: Written self-care plan, Pre-printed educational material  (08/11/2009)    Hypertension self-management support not done because: Good outcomes  (02/06/2010)    Lipid self-management support: Written self-care plan  (05/10/2009)     Lipid self-management support not done because: Good outcomes  (11/01/2009)

## 2010-09-25 NOTE — Progress Notes (Signed)
Summary: phn msg  Phone Note Call from Patient Call back at [phone]   Caller: Patient Summary of Call: pt states that the place where bx was done is now infected and is putting neosporin ointment on it - wants to know if this is OK. Initial call taken by: De Nurse,  December 22, 2009 9:36 AM  Follow-up for Phone Call        states the spot is red & painful & has pus coming out of it. asked her to come in now. she will be here in about 30 minutes Follow-up by: Golden Circle RN,  December 22, 2009 9:39 AM

## 2010-09-25 NOTE — Assessment & Plan Note (Signed)
Summary: place on leg changing colors,tcb   Vital Signs:  Patient profile:   65 year old female Weight:      281 pounds BMI:     43.52 Temp:     98.0 degrees F oral Pulse rate:   62 / minute BP sitting:   186 / 92  (left arm)  Vitals Entered By: Alphia Kava (March 15, 2009 9:58 AM)  Nutrition Counseling: Patient's BMI is greater than 25 and therefore counseled on weight management options. CC: f/u l leg spts Is Patient Diabetic? Yes   Primary Care Provider:  Lequita Asal  MD  CC:  f/u l leg spts.  History of Present Illness: Patient 65 y/o female here with "spots on leg"  "spots on leg." - noticed several weeks ago quarter sized erythematous area on L posterior calf. nontender, nonpruritic. has increased in size to approximately size of half dollar now. also developed two smaller areas on anterior calf, but similar in appearance. denies any trauma, insect exposure, bruising elsewhere  HTN- BP elevated. taking all meds. denies CP, palpitations, SOB, increased edema, blurred vision, headache. on lisinopril/hctz and carvedilol  Arthritis- Pt with continued shoulder and knee pain. Limited ROM. unable to exercise as much 2/2 pain. reports relief with meloxicam.  still not interested in injections    Habits & Providers  Alcohol-Tobacco-Diet     Tobacco Status: never     Tobacco Counseling: not indicated; no tobacco use  Current Medications (verified): 1)  Bayer Childrens Aspirin 81 Mg Chew (Aspirin) .... Take 1 Tablet By Mouth Once A Day 2)  Gemfibrozil 600 Mg Tabs (Gemfibrozil) .... Take 1 Tablet By Mouth Twice A Day 3)  Lantus 100 Unit/ml Soln (Insulin Glargine) .... 60 Units Inj [place] Two Times A Day. Dispense 4 Vials 4)  Lisinopril-Hydrochlorothiazide 20-12.5 Mg Tabs (Lisinopril-Hydrochlorothiazide) .... 2  Tablets By Mouth Qday 5)  Levothyroxine Sodium 112 Mcg  Tabs (Levothyroxine Sodium) .Marland Kitchen.. 1 By Mouth Daily 6)  Lancets   Misc (Lancets) .... 3 Lancets Qday 7)   Carvedilol 6.25 Mg  Tabs (Carvedilol) .Marland Kitchen.. 1 Tab By Mouth Two Times Daily 8)  Crestor 20 Mg  Tabs (Rosuvastatin Calcium) .Marland Kitchen.. 1 By Mouth At Bedtime 9)  Triamcinolone Acetonide 0.1 %  Crea (Triamcinolone Acetonide) .... Aaa Bid 10)  Permethrin 5 % Crea (Permethrin) .... Apply From Head To Toe and Leave On For 8-14 Hours Then Wash Off. Was All Bedding and Clothing.  Reapply in 1 Week As Needed. 1 Large Tub 11)  Insulin Syringe Ult Thin Short 30g X 5/16" 1 Ml Misc (Insulin Syringe-Needle U-100) .... Use As Directed. 12)  Mobic 15 Mg Tabs (Meloxicam) .Marland Kitchen.. 1 Tablet By Mouth Daily For 2 Weeks, Then As Needed For Pain. 13)  Voltaren 1 % Gel (Diclofenac Sodium) 14)  Voltaren 1 % Gel (Diclofenac Sodium) .... Apply 2g To Shoulder or 4 G To Knees Qid As Needed For Pain. Dispense 100 G.  Allergies (verified): No Known Drug Allergies  Physical Exam  General:  NAD, obese, alert. BP elevated. vitals reviewed.  Neck:  no JVD Heart:  Normal rate and regular rhythm. S1 and S2 normal without gallop, murmur, click, rub or other extra sounds. Pulses:  2+ distal pulses Extremities:  1+ edema RLE; no edema of LLE.  Skin:  5x5 cm area of erythema with ?central clearing and surrounding petechiae on L posterior calf. blanchable.  two small 1x1 cm areas of petechiae with lacy erythematous appearance on anterior L calf. denies tenderness  with palpation, no induration, warmth.    Impression & Recommendations:  Problem # 1:  SKIN RASH (ICD-782.1) Assessment New  possibly tinea corporis. will tx with OTC meds for that (see patient instructions) if no improvement, will do punch bx.   Her updated medication list for this problem includes:    Triamcinolone Acetonide 0.1 % Crea (Triamcinolone acetonide) .Marland Kitchen... Aaa bid  Orders: [facility]- Est Level  3 (31497)  Problem # 2:  HYPERTENSION, BENIGN SYSTEMIC (ICD-401.1) Assessment: Deteriorated  patient not willing to add medication at this time. would like to attempt  lifestyle modifications first. please see patient instructions. would add CCB at next visit if BPs remain elevated.   Her updated medication list for this problem includes:    Lisinopril-hydrochlorothiazide 20-12.5 Mg Tabs (Lisinopril-hydrochlorothiazide) .Marland Kitchen... 2  tablets by mouth qday    Carvedilol 6.25 Mg Tabs (Carvedilol) .Marland Kitchen... 1 tab by mouth two times daily  Orders: [facility]- Est Level  3 (02637)  Problem # 3:  BICEPS TENDINITIS, LEFT (ICD-726.12) Assessment: Unchanged  patient remains opposed to injection; however, initially asked for ortho referral. i think a PT referral would be a better place to start. patient agreeable.   Orders: [facility]- Est Level  3 (85885) Physical Therapy Referral (PT)  Problem # 4:  OBESITY, NOS (ICD-278.00) Assessment: Deteriorated patient attempting full scale lifestyle modification overhaul. will monitor.   Patient Instructions: 1)  Follow up with Dr. Lanier Prude in September for your diabetes 2)  Use Lotrimin, Lamisil, etc. for your leg. If it doesn't improve, you may need a biopsy.  3)  Keep track of your blood pressures!!! If the top number stays higher than 160 for several weeks in a row, you need to let me know. We will have to start a new medication. 4)  We will call you with information about physical therapy.     Appended Document: place on leg changing colors,tcb    Clinical Lists Changes  Observations: Added new observation of LIPID PROGRS: At goal (03/15/2009 12:32) Added new observation of LIPID FSREVW: Yes (03/15/2009 12:32) Added new observation of HTN PROGRESS: Deteriorated (03/15/2009 12:32) Added new observation of HTN FSREVIEW: Yes (03/15/2009 12:32) Added new observation of DM PROGRESS: Unchanged (03/15/2009 12:32) Added new observation of DM FSREVIEW: Yes (03/15/2009 12:32) Added new observation of NURSEINSTR: Diabetic foot exam today  (03/15/2009 12:32) Added new observation of MAMMRECACT: Refused (03/15/2009 12:32) Added new  observation of FLUVAXDECLN: Not available (03/15/2009 12:32)       Prevention & Chronic Care Immunizations   Influenza vaccine: given  (06/01/2008)   Influenza vaccine deferral: Not available  (03/15/2009)   Influenza vaccine due: 06/01/2009    Tetanus booster: 10/01/2004: Done.   Tetanus booster due: 10/01/2014    Pneumococcal vaccine: Not documented    H. zoster vaccine: Not documented  Colorectal Screening   Hemoccult: not indicated  (08/21/2008)   Hemoccult due: Not Indicated    Colonoscopy: sessile polyps  (08/24/2007)   Colonoscopy due: 08/23/2010  Other Screening   Pap smear: pt with hysterectomy  (06/01/2008)   Pap smear due: Not Indicated    Mammogram: normal  (10/15/2007)   Mammogram action/deferral: Refused  (03/15/2009)   Mammogram due: 10/14/2008    DXA bone density scan: Done.  (12/24/2004)   DXA scan due: None     Smoking status: never  (03/15/2009)  Diabetes Mellitus   HgbA1C: 8.6  (01/30/2009)   Hemoglobin A1C due: 09/01/2008    Eye exam: normal  (10/05/2007)   Eye exam  due: 10/04/2008    Foot exam: yes  (06/01/2008)   High risk foot: Not documented   Foot care education: completed  (06/01/2008)   Foot exam due: 10/11/2008    Urine microalbumin/creatinine ratio: Not documented   Urine microalbumin/cr due: 06/01/2009    Diabetes flowsheet reviewed?: Yes   Progress toward A1C goal: Unchanged  Hypertension   Last Blood Pressure: 186 / 92  (03/15/2009)   Serum creatinine: 0.91  (01/30/2009)   Serum potassium 4.2  (01/30/2009)    Hypertension flowsheet reviewed?: Yes   Progress toward BP goal: Deteriorated  Lipids   Total Cholesterol: 131  (10/13/2008)   LDL: 71  (10/13/2008)   LDL Direct: 79  (02/17/2008)   HDL: 43  (10/13/2008)   Triglycerides: 83  (10/13/2008)    SGOT (AST): 13  (01/30/2009)   SGPT (ALT): 9  (01/30/2009)   Alkaline phosphatase: 55  (01/30/2009)   Total bilirubin: 0.3  (01/30/2009)    Lipid flowsheet  reviewed?: Yes   Progress toward LDL goal: At goal  Self-Management Support :    Diabetes self-management support: Not documented   Last diabetes self-management training by diabetes educator: completed    Hypertension self-management support: Not documented    Lipid self-management support: Not documented    Nursing Instructions: Diabetic foot exam today

## 2010-09-25 NOTE — Progress Notes (Signed)
Summary: blood sugar readings   Phone Note Call from Patient Call back at Home Phone [phone]   Reason for Call: Talk to Nurse Summary of Call: pt sts she was told to call in her blood sugar readings, monday was 107, tuesday  83,  and today it was 111 Initial call taken by: ERIN LEVAN,  October 14, 2007 2:54 PM  Follow-up for Phone Call        83 is a tad low, so please ask her to decrease her Lantus to 26 units.  Could she call again next week with a few more fastings to make sure we aren't going to low again?  Thanks! Follow-up by: Rolm Gala MD,  October 15, 2007 10:54 AM  Additional Follow-up for Phone Call Additional follow up Details #1::        Notified pt. of above. Pt. agreeable. Additional Follow-up by: Laquinta Hazell LPN,  October 15, 2007 10:59 AM         Last Mammogram:  Done. (07/26/2005 12:00:00 AM) Mammogram Result Date:  10/15/2007 Mammogram Result:  normal Mammogram Next Due:  1 yr

## 2010-09-25 NOTE — Assessment & Plan Note (Signed)
Summary: lump in arm pit/ACM   Vital Signs:  Patient Profile:   65 Years Old Female Height:     67.5 inches (171.45 cm) Weight:      270 pounds Pulse rate:   61 / minute BP sitting:   143 / 79  Vitals Entered By: Arlyss Repress CMA, (June 23, 2008 3:19 PM)                 PCP:  Lequita Asal  MD  Chief Complaint:  L SHOULDER PAIN W / LUMP UNDERARM.  History of Present Illness: Pt with 1 month h/o left shoulder pain.  Recently seen by PCP and diagnosed with biceps tendonitis.  Taking ibuprofen 600mg  but not helping.  Hurts with any sort of movement.  Working out in gym with [employer] recently.  Has full range of motion.  No injury.  Noticed last night she has a small tender lump under her left armpit as well.  No prior h/o skin abscesses per her report.  No fevers, no drainage from bump.  Does shave under her armpits.    Current Allergies: No known allergies       Physical Exam  General:     NAD, obese, alert. Msk:     Shoulder: Inspection reveals no abnormalities, atrophy or asymmetry. Palpation shows no tenderness along clavicle or AC joint.   + palpation over bicipital groove. ROM is full in all planes. Rotator cuff strength normal throughout. Speeds and Yergason's tests both POSITIVE. No painful arc and no drop arm sign.     Neurologic:     alert & oriented X3.   Skin:     left axilla with discrete 1cm area of hyperpigmented induration.  No erythema, flucuance, or drainage.  Tender to palpation.    Impression & Recommendations:  Problem # 1:  BICEPS TENDINITIS, LEFT (ICD-726.12) Assessment: Unchanged I agree that exam is c/w biceps tendinitis given + speeds and yergason's.  Will start mobic 15mg  daily for 2 weeks, then as needed.   Orders: [facility]- Est Level  3 ([id number])   Problem # 2:  FURUNCLE (ICD-680.9) Assessment: New No active abscess to be drained, no erythema.  Advised warm compresses three times a day and neosporin ointment.  Gave warning  signs on when to return to possible drainage if necessary. Orders: [facility]- Est Level  3 ([id number])   Complete Medication List: 1)  Bayer Childrens Aspirin 81 Mg Chew (Aspirin) .... Take 1 tablet by mouth once a day 2)  Gemfibrozil 600 Mg Tabs (Gemfibrozil) .... Take 1 tablet by mouth twice a day 3)  Lantus 100 Unit/ml Soln (Insulin glargine) .... 56 units inj [place] at bedtime. dispense 3 vials. 4)  Lisinopril-hydrochlorothiazide 20-12.5 Mg Tabs (Lisinopril-hydrochlorothiazide) .... 2  tablets by mouth qday 5)  Levothyroxine Sodium 112 Mcg Tabs (Levothyroxine sodium) .Marland Kitchen.. 1 by mouth daily 6)  Lancets Misc (Lancets) .... 3 lancets qday 7)  Coreg 12.5 Mg Tabs (Carvedilol) .Marland Kitchen.. 1 by mouth bid 8)  Crestor 20 Mg Tabs (Rosuvastatin calcium) .Marland Kitchen.. 1 by mouth at bedtime 9)  Triamcinolone Acetonide 0.1 % Crea (Triamcinolone acetonide) .... Aaa bid 10)  Permethrin 5 % Crea (Permethrin) .... Apply from head to toe and leave on for 8-14 hours then wash off. was all bedding and clothing.  reapply in 1 week as needed. 1 large tub 11)  Insulin Syringe Ult Thin Short 30g X 5/16" 1 Ml Misc (Insulin syringe-needle u-100) .... Use as directed. 12)  Mobic 15 Mg Tabs (  Meloxicam) .Marland Kitchen.. 1 tablet by mouth daily for 2 weeks, then as needed for pain.   Patient Instructions: 1)  For the lump in your armpit, use warm washcloth compresses 2-3 times daily.  Also apply neosporin ointment twice daily.  Follow-up if the lump gets larger, red, or more painful as it may need to eventually be drained if it gets bigger. 2)  For your shoulder, you have been prescribed mobic anti-inflammatory.  Take this once daily with food for 2 weeks, then as needed for shoulder pain.  Continue exercises at gym.   Prescriptions: MOBIC 15 MG TABS (MELOXICAM) 1 tablet by mouth daily for 2 weeks, then as needed for pain.  #30 x 0   Entered and Authorized by:   Sylvan Cheese MD   Signed by:   Sylvan Cheese MD on 06/23/2008   Method used:   Electronically to         [id number] (retail)       [address]       [place], [place]  [address]       Ph: [phone] or [phone]       Fax: [phone]   RxID:   [id number]  ]

## 2010-09-25 NOTE — Letter (Signed)
Summary: Results Follow-up Letter  [facility]  [address]   [place], [place] [address]   Phone: [phone]  Fax: [phone]    02/01/2009  [address] [place], [place]  [address]  Dear [name],   Your electrolyte panel and kidney function tests were all normal. Your A1C (your diabetes test) was 8.6%. For good diabetes control, that number should be less than 7%. Please continue to monitor your diet, try and increase your exercise, and check your blood sugars regularly. Call and schedule a follow up appointment September/October for [place] to reevaluate your medical issues. If you have any questions, please contact our office.  Sincerely,  Kaitlyn Asal  MD [facility]           Appended Document: Results Follow-up Letter Mailed.

## 2010-09-25 NOTE — Assessment & Plan Note (Signed)
Summary: diabetes , check up , needs TSH /ls   Vital Signs:  Patient profile:   65 year old female Weight:      262.7 pounds Pulse rate:   76 / minute BP sitting:   127 / 74  (right arm)  Vitals Entered By: Arlyss Repress CMA, (May 29, 2010 10:59 AM) CC: f/up DM. refill meds. Is Patient Diabetic? Yes Pain Assessment Patient in pain? no        Primary Care Provider:  Rodney Langton MD  CC:  f/up DM. refill meds..  History of Present Illness: 26F with DM2 here for fu.  DM2:  Dropped Lantus to 50 qHS 2/2 some AM lows in the 60's.  Most CBGs have been in the 60-140 range.  No more lows.  No problems with insulin, no refills needed, checks feet daily.    Hypothyroid:  Due for TSH.    Skin rash:  no pain, no itch, no trauma, no nosebleeds.  Located on back of legs and shin.  Habits & Providers  Alcohol-Tobacco-Diet     Tobacco Status: never  Current Medications (verified): 1)  Bayer Childrens Aspirin 81 Mg Chew (Aspirin) .... Take 1 Tablet By Mouth Once A Day 2)  Lantus 100 Unit/ml Soln (Insulin Glargine) .... 60 Units Injected Subcutaneously Qhs 3)  Lisinopril-Hydrochlorothiazide 20-25 Mg Tabs (Lisinopril-Hydrochlorothiazide) .... One Tab By Mouth Daily 4)  Levothyroxine Sodium 112 Mcg  Tabs (Levothyroxine Sodium) .Marland Kitchen.. 1 By Mouth Daily 5)  Crestor 20 Mg  Tabs (Rosuvastatin Calcium) .Marland Kitchen.. 1 By Mouth At Bedtime 6)  Insulin Syringe Ult Thin Short 30g X 5/16" 1 Ml Misc (Insulin Syringe-Needle U-100) .... Use As Directed. 7)  Lancets   Misc (Lancets) .... 3 Lancets Qday 8)  Nexium 20 Mg Cpdr (Esomeprazole Magnesium) .... Take 1 Tablet Every Day. 9)  Norco 5-325 Mg Tabs (Hydrocodone-Acetaminophen) .... As Needed For Pain 10)  Metoprolol Tartrate 100 Mg Tabs (Metoprolol Tartrate) .... One Tab By Mouth Bid  Allergies (verified): No Known Drug Allergies  Review of Systems       See HPI  Physical Exam  General:  Well-developed,well-nourished,in no acute distress;  alert,appropriate and cooperative throughout examination Lungs:  Normal respiratory effort, chest expands symmetrically. Lungs are clear to auscultation, no crackles or wheezes. Heart:  Normal rate and regular rhythm. S1 and S2 normal without gallop, murmur, click, rub or other extra sounds. Abdomen:  Bowel sounds positive,abdomen soft and non-tender without masses, organomegaly or hernias noted. Extremities:  Petechia-like lesions present behind knees and on shin, non-blanching, not raised, non-tender, not warm.   Impression & Recommendations:  Problem # 1:  DIABETES MELLITUS, TYPE II, CONTROLLED (ICD-250.00) Assessment Improved Increased to 60 units of lantus.   Awaiting A1c. RTC 3-4 months.  Her updated medication list for this problem includes:    Bayer Childrens Aspirin 81 Mg Chew (Aspirin) .Marland Kitchen... Take 1 tablet by mouth once a day    Lantus 100 Unit/ml Soln (Insulin glargine) .Marland KitchenMarland KitchenMarland KitchenMarland Kitchen 60 units injected subcutaneously qhs    Lisinopril-hydrochlorothiazide 20-25 Mg Tabs (Lisinopril-hydrochlorothiazide) ..... One tab by mouth daily  Orders: A1C-[facility] (21308) [facility]- Est  Level 4 (65784)  Problem # 2:  SKIN RASH (ICD-782.1) Assessment: Unchanged Checking CBC to ensure not thrombocytopenic.   Not bothering pt so would not intervene.  Orders: CBC-[facility] ([id number]) [facility]- Est  Level 4 (52841)  Problem # 3:  HYPOTHYROIDISM (ICD-244.9) Assessment: Unchanged Checking TSH.  Her updated medication list for this problem includes:    Levothyroxine Sodium  112 Mcg Tabs (Levothyroxine sodium) .Marland Kitchen... 1 by mouth daily  Orders: [facility]- Est  Level 4 ([address]109)  Complete Medication List: 1)  Bayer Childrens Aspirin 81 Mg Chew (Aspirin) .... Take 1 tablet by mouth once a day 2)  Lantus 100 Unit/ml Soln (Insulin glargine) .... 60 units injected subcutaneously qhs 3)  Lisinopril-hydrochlorothiazide 20-25 Mg Tabs (Lisinopril-hydrochlorothiazide) .... One tab by mouth daily 4)  Levothyroxine Sodium 112 Mcg Tabs  (Levothyroxine sodium) .Marland Kitchen.. 1 by mouth daily 5)  Crestor 20 Mg Tabs (Rosuvastatin calcium) .Marland Kitchen.. 1 by mouth at bedtime 6)  Insulin Syringe Ult Thin Short 30g X 5/16" 1 Ml Misc (Insulin syringe-needle u-100) .... Use as directed. 7)  Lancets Misc (Lancets) .... 3 lancets qday 8)  Nexium 20 Mg Cpdr (Esomeprazole magnesium) .... Take 1 tablet every day. 9)  Norco 5-325 Mg Tabs (Hydrocodone-acetaminophen) .... As needed for pain 10)  Metoprolol Tartrate 100 Mg Tabs (Metoprolol tartrate) .... One tab by mouth bid  Patient Instructions: 1)  Lantus 60 units. 2)  Wear compression hose. 3)  Checking bloodwork today. 4)  Come back to see me in 3-4 months, make an appt for this. 5)  -Dr. Karie Schwalbe.  Last Flu Vaccine:  Fluvax MCR (08/11/2009 3:12:57 PM) Flu Vaccine Next Due:  1 yr Last Mammogram:  ASSESSMENT: Negative - BI-RADS 1^MM DIGITAL SCREENING (03/15/2010 12:29:00 PM) Mammogram Next Due:  1 yr   Appended Document: A1c  7.2 %    Lab Visit  Laboratory Results   Blood Tests   Date/Time Received: May 29, 2010 11:48 AM  Date/Time Reported: May 29, 2010 5:18 PM   HGBA1C: 7.2%   (Normal Range: Non-Diabetic - 3-6%   Control Diabetic - 6-8%)  Comments: ...............test performed by......Marland KitchenBonnie A. [place], MLS (ASCP)cm    Orders Today:

## 2010-09-25 NOTE — Progress Notes (Signed)
Summary: phn msg  Phone Note Call from Patient   Caller: Patient Summary of Call: FYi    WIll wait for pt to call back with different insurance information. PT placed on hold.  pt states before she goes to a physical therapist she wants to check into changing her ins co due to having so much trouble finding providers. Initial call taken by: Clydell Hakim,  March 15, 2009 4:34 PM

## 2010-09-25 NOTE — Assessment & Plan Note (Signed)
Summary: shoulder pain & DM,df   Vital Signs:  Patient profile:   65 year old female Weight:      279.9 pounds BMI:     43.35 Temp:     98.4 degrees F oral Pulse rate:   65 / minute BP sitting:   142 / 76  (left arm)  Vitals Entered By: Alphia Kava (May 10, 2009 3:37 PM)  Nutrition Counseling: Patient's BMI is greater than 25 and therefore counseled on weight management options. CC: DM,shoulder pain, HTN Is Patient Diabetic? Yes   Primary Care Provider:  Lequita Asal  MD  CC:  DM, shoulder pain, and HTN.  History of Present Illness: Patient 65 y/o female here with "spots on leg"  DM- fastings labile depending on diet. currently on lantus 60 units two times a day. denies polyuria, polydipsia. CBGs with fastings in 80s to 300s. reports adherence to medication regimen. has not been going to gym recently  HTN- BP improved. taking all meds. denies CP, palpitations, SOB, increased edema, blurred vision, headache. on lisinopril/hctz and carvedilol  Arthritis- Pt with continued shoulder and knee pain. Limited ROM. unable to exercise as much 2/2 pain. reports relief with meloxicam.  still not interested in injections. was not able to pursue PT 2/2 insurance issues.      Habits & Providers  Alcohol-Tobacco-Diet     Tobacco Status: never  Current Medications (verified): 1)  Bayer Childrens Aspirin 81 Mg Chew (Aspirin) .... Take 1 Tablet By Mouth Once A Day 2)  Gemfibrozil 600 Mg Tabs (Gemfibrozil) .... Take 1 Tablet By Mouth Twice A Day 3)  Lantus 100 Unit/ml Soln (Insulin Glargine) .... 60 Units Inj [place] Two Times A Day. Dispense 4 Vials 4)  Lisinopril-Hydrochlorothiazide 20-12.5 Mg Tabs (Lisinopril-Hydrochlorothiazide) .... 2  Tablets By Mouth Qday 5)  Levothyroxine Sodium 112 Mcg  Tabs (Levothyroxine Sodium) .Marland Kitchen.. 1 By Mouth Daily 6)  Lancets   Misc (Lancets) .... 3 Lancets Qday 7)  Carvedilol 6.25 Mg  Tabs (Carvedilol) .Marland Kitchen.. 1 Tab By Mouth Two Times Daily 8)  Crestor 20  Mg  Tabs (Rosuvastatin Calcium) .Marland Kitchen.. 1 By Mouth At Bedtime 9)  Triamcinolone Acetonide 0.1 %  Crea (Triamcinolone Acetonide) .... Aaa Bid 10)  Permethrin 5 % Crea (Permethrin) .... Apply From Head To Toe and Leave On For 8-14 Hours Then Wash Off. Was All Bedding and Clothing.  Reapply in 1 Week As Needed. 1 Large Tub 11)  Insulin Syringe Ult Thin Short 30g X 5/16" 1 Ml Misc (Insulin Syringe-Needle U-100) .... Use As Directed. 12)  Mobic 15 Mg Tabs (Meloxicam) .Marland Kitchen.. 1 Tablet By Mouth Daily For 2 Weeks, Then As Needed For Pain. 13)  Voltaren 1 % Gel (Diclofenac Sodium) 14)  Voltaren 1 % Gel (Diclofenac Sodium) .... Apply 2g To Shoulder or 4 G To Knees Qid As Needed For Pain. Dispense 100 G.  Allergies (verified): No Known Drug Allergies  Physical Exam  General:  NAD, obese, alert. BP elevated. vitals reviewed.  Mouth:  fair dentition and teeth missing.   Neck:  no JVD Lungs:  Normal respiratory effort, chest expands symmetrically. Lungs are clear to auscultation, no crackles or wheezes. Heart:  Normal rate and regular rhythm. S1 and S2 normal without gallop, murmur, click, rub or other extra sounds. Abdomen:  Bowel sounds positive,abdomen soft and non-tender without masses, organomegaly or hernias noted. Msk:  Shoulder: Inspection reveals no abnormalities, atrophy or asymmetry. Palpation shows no tenderness along clavicle or AC joint.   + TTP  over bicipital groove L forearm ROM is full in all planes, but pain with forward extension      Extremities:  1+ edema RLE; no edema of LLE.    Impression & Recommendations:  Problem # 1:  DIABETES MELLITUS, TYPE II, UNCONTROLLED (ICD-250.02) Assessment Improved A1C improved at 8.3. continued elevation 2/2 dietary indiscretions. given that fastings are occasionally as low as 80s, no changes for now. BP near goal. on statin.   Her updated medication list for this problem includes:    Bayer Childrens Aspirin 81 Mg Chew (Aspirin) .Marland Kitchen... Take 1  tablet by mouth once a day    Lantus 100 Unit/ml Soln (Insulin glargine) .Marland KitchenMarland KitchenMarland KitchenMarland Kitchen 60 units inj [place] two times a day. dispense 4 vials    Lisinopril-hydrochlorothiazide 20-12.5 Mg Tabs (Lisinopril-hydrochlorothiazide) .Marland Kitchen... 2  tablets by mouth qday  Orders: A1C-[facility] ([address]  Problem # 2:  HYPERTENSION, BENIGN SYSTEMIC (ICD-401.1) Assessment: Improved  no changes. patient opposed to additional med.   Her updated medication list for this problem includes:    Lisinopril-hydrochlorothiazide 20-12.5 Mg Tabs (Lisinopril-hydrochlorothiazide) .Marland Kitchen... 2  tablets by mouth qday    Carvedilol 6.25 Mg Tabs (Carvedilol) .Marland Kitchen... 1 tab by mouth two times daily  Orders: [facility]- Est  Level 4 ([address])  Problem # 3:  SHOULDER PAIN, LEFT, CHRONIC (ICD-719.41) Assessment: Unchanged  pt diagnosed with L biceps tendonitis  ~1 year ago. refuses injections. symptoms worsening. ?underlying arthritis vs. rotator cuff injury. pt desirous of referral to sports medicine or ortho. will start with sports med referral.   Her updated medication list for this problem includes:    Bayer Childrens Aspirin 81 Mg Chew (Aspirin) .Marland Kitchen... Take 1 tablet by mouth once a day    Mobic 15 Mg Tabs (Meloxicam) .Marland Kitchen... 1 tablet by mouth daily for 2 weeks, then as needed for pain.  Orders: [facility]- Est  Level 4 ([address]11) Sports Medicine (Sports Med)  Other Orders: A1C-[facility] (91478) Mammogram (Screening) (Mammo)  Patient Instructions: 1)  [facility] with Dr. Jennette Kettle for 2-3 weeks for Left Shoulder Pain 2)  It is important that you exercise reguarly at least 20 minutes 5 times a week. If you develop chest pain, have severe difficulty breathing, or feel very tired, stop exercising immediately and seek medical attention.  3)  You need to lose weight. Consider a lower calorie diet and regular exercise.    Prevention & Chronic Care Immunizations   Influenza vaccine: given  (06/01/2008)   Influenza vaccine deferral:  Not available  (03/15/2009)   Influenza vaccine due: 06/01/2009    Tetanus booster: 10/01/2004: Done.   Tetanus booster due: 10/01/2014    Pneumococcal vaccine: Not documented    H. zoster vaccine: Not documented   H. zoster vaccine deferral: Not available  (05/10/2009)  Colorectal Screening   Hemoccult: not indicated  (08/21/2008)   Hemoccult due: Not Indicated    Colonoscopy: sessile polyps  (08/24/2007)   Colonoscopy due: 08/23/2010  Other Screening   Pap smear: pt with hysterectomy  (06/01/2008)   Pap smear due: Not Indicated    Mammogram: normal  (10/15/2007)   Mammogram action/deferral: Ordered  (05/10/2009)   Mammogram due: 10/14/2008    DXA bone density scan: Done.  (12/24/2004)   DXA scan due: None    Smoking status: never  (05/10/2009)  Diabetes Mellitus   HgbA1C: 8.3   (05/10/2009)   Hemoglobin A1C due: 09/01/2008    Eye exam: no diabetic changes  (09/26/2008)   Eye exam due: 09/26/2009  Foot exam: yes  (06/01/2008)   High risk foot: Not documented   Foot care education: completed  (06/01/2008)   Foot exam due: 10/11/2008    Urine microalbumin/creatinine ratio: Not documented   Urine microalbumin/cr due: 06/01/2009    Diabetes flowsheet reviewed?: Yes   Progress toward A1C goal: Unchanged  Lipids   Total Cholesterol: 131  (10/13/2008)   LDL: 71  (10/13/2008)   LDL Direct: 79  (02/17/2008)   HDL: 43  (10/13/2008)   Triglycerides: 83  (10/13/2008)    SGOT (AST): 13  (01/30/2009)   SGPT (ALT): 9  (01/30/2009)   Alkaline phosphatase: 55  (01/30/2009)   Total bilirubin: 0.3  (01/30/2009)    Lipid flowsheet reviewed?: Yes   Progress toward LDL goal: Unchanged  Hypertension   Last Blood Pressure: 142 / 76  (05/10/2009)   Serum creatinine: 0.91  (01/30/2009)   Serum potassium 4.2  (01/30/2009)    Hypertension flowsheet reviewed?: Yes   Progress toward BP goal: Improved  Self-Management Support :   Personal Goals (by the next clinic  visit) :     Personal A1C goal: 8  (05/10/2009)     Personal blood pressure goal: 130/80  (05/10/2009)     Personal LDL goal: 100  (05/10/2009)    Patient will work on the following items until the next clinic visit to reach self-care goals:     Medications and monitoring: take my medicines every day, check my blood sugar, examine my feet every day  (05/10/2009)     Activity: take a 30 minute walk every day  (05/10/2009)    Diabetes self-management support: Written self-care plan  (05/10/2009)   Diabetes care plan printed   Last diabetes self-management training by diabetes educator: completed    Hypertension self-management support: Written self-care plan  (05/10/2009)   Hypertension self-care plan printed.    Lipid self-management support: Written self-care plan  (05/10/2009)   Lipid self-care plan printed.   Nursing Instructions: Schedule screening mammogram (see order)    Prevention & Chronic Care Immunizations   Influenza vaccine: given  (06/01/2008)   Influenza vaccine deferral: Not available  (03/15/2009)   Influenza vaccine due: 06/01/2009    Tetanus booster: 10/01/2004: Done.   Tetanus booster due: 10/01/2014    Pneumococcal vaccine: Not documented    H. zoster vaccine: Not documented   H. zoster vaccine deferral: Not available  (05/10/2009)  Colorectal Screening   Hemoccult: not indicated  (08/21/2008)   Hemoccult due: Not Indicated    Colonoscopy: sessile polyps  (08/24/2007)   Colonoscopy due: 08/23/2010  Other Screening   Pap smear: pt with hysterectomy  (06/01/2008)   Pap smear due: Not Indicated    Mammogram: normal  (10/15/2007)   Mammogram action/deferral: Ordered  (05/10/2009)   Mammogram due: 10/14/2008    DXA bone density scan: Done.  (12/24/2004)   DXA scan due: None    Smoking status: never  (05/10/2009)  Diabetes Mellitus   HgbA1C: 8.3   (05/10/2009)   Hemoglobin A1C due: 09/01/2008    Eye exam: no diabetic changes   (09/26/2008)   Eye exam due: 09/26/2009    Foot exam: yes  (06/01/2008)   High risk foot: Not documented   Foot care education: completed  (06/01/2008)   Foot exam due: 10/11/2008    Urine microalbumin/creatinine ratio: Not documented   Urine microalbumin/cr due: 06/01/2009    Diabetes flowsheet reviewed?: Yes   Progress toward A1C goal: Unchanged  Lipids   Total Cholesterol: 131  (  10/13/2008)   LDL: 71  (10/13/2008)   LDL Direct: 79  (02/17/2008)   HDL: 43  (10/13/2008)   Triglycerides: 83  (10/13/2008)    SGOT (AST): 13  (01/30/2009)   SGPT (ALT): 9  (01/30/2009)   Alkaline phosphatase: 55  (01/30/2009)   Total bilirubin: 0.3  (01/30/2009)    Lipid flowsheet reviewed?: Yes   Progress toward LDL goal: Unchanged  Hypertension   Last Blood Pressure: 142 / 76  (05/10/2009)   Serum creatinine: 0.91  (01/30/2009)   Serum potassium 4.2  (01/30/2009)    Hypertension flowsheet reviewed?: Yes   Progress toward BP goal: Improved  Self-Management Support :   Personal Goals (by the next clinic visit) :     Personal A1C goal: 8  (05/10/2009)     Personal blood pressure goal: 130/80  (05/10/2009)     Personal LDL goal: 100  (05/10/2009)    Patient will work on the following items until the next clinic visit to reach self-care goals:     Medications and monitoring: take my medicines every day, check my blood sugar, examine my feet every day  (05/10/2009)     Activity: take a 30 minute walk every day  (05/10/2009)    Diabetes self-management support: Written self-care plan  (05/10/2009)   Diabetes care plan printed   Last diabetes self-management training by diabetes educator: completed    Hypertension self-management support: Written self-care plan  (05/10/2009)   Hypertension self-care plan printed.    Lipid self-management support: Written self-care plan  (05/10/2009)   Lipid self-care plan printed.   Laboratory Results   Blood Tests   Date/Time Received: May 10, 2009 3:39 PM  Date/Time Reported: May 10, 2009 4:38 PM   HGBA1C: 8.3 %   (Normal Range: Non-Diabetic - 3-6%   Control Diabetic - 6-8%)  Comments: ...............test performed by......Marland KitchenBonnie A. [place], [place] (ASCP)

## 2010-09-25 NOTE — Assessment & Plan Note (Signed)
Summary: BILAT SHOULDER PAIN/MJD   Vital Signs:  Patient profile:   65 year old female BP sitting:   140 / 80  Vitals Entered By: Lillia Pauls CMA (June 12, 2009 10:26 AM)  Primary Provider:  Lequita Asal  MD   History of Present Illness: CC: bilateral shoulder pain  2 yr history of bilateral shoulder pain, L > R.  Today's visit concentrated on L shoulder.  Denies inciting event.  Over the last year has also noted decreased strength.  Recently at night getting worse.  Denies paresthesias down arm.  On mobic, voltaren and alleve as needed.  Notes some improvement with these.    Aversion to steroid injections, says "I don't believe in them".    Allergies: No Known Drug Allergies  Physical Exam  General:  NAD, obese, alert.  Msk:  Shoulder: Inspection reveals no abnormalities, atrophy or asymmetry. Palpation shows no tenderness along clavicle or AC joint.   Left: + TTP over bicipital groove L arm as well as lateral deltoid.   + speeds + yergason's Decreased ROM left shoulder as compared to right, in forward flexion and abduction.  Negative crossover, negative emptycan sign, negative sperling sign. neck forward position by  ~ 10 deg but neck ROM is avg shoulders rolled forward as well Pulses:  2+ periph pulses Neurologic:  neurovascularly intact.  Strength 5/5 BUE except left biceps 4/5 strength.  sensation intact.   Impression & Recommendations:  Problem # 1:  SHOULDER PAIN, LEFT, CHRONIC (ICD-719.41) agree with diagnosis of bicipital tendonitis.  Instructed in exercises to do to strengthen biceps.  Slowly build up.  Continue voltaren gel as well as mobic.  advised not to use alleve while using mobic.   Her updated medication list for this problem includes:    Bayer Childrens Aspirin 81 Mg Chew (Aspirin) .Marland Kitchen... Take 1 tablet by mouth once a day    Mobic 15 Mg Tabs (Meloxicam) .Marland Kitchen... 1 tablet by mouth daily for 2 weeks, then as needed for pain.  work on posture and on  neck forward position  shoulder respond in 6 to 8 weeks of home exercise  Complete Medication List: 1)  Bayer Childrens Aspirin 81 Mg Chew (Aspirin) .... Take 1 tablet by mouth once a day 2)  Gemfibrozil 600 Mg Tabs (Gemfibrozil) .... Take 1 tablet by mouth twice a day 3)  Lantus 100 Unit/ml Soln (Insulin glargine) .... 60 units inj [place] two times a day. dispense 4 vials 4)  Lisinopril-hydrochlorothiazide 20-12.5 Mg Tabs (Lisinopril-hydrochlorothiazide) .... 2  tablets by mouth qday 5)  Levothyroxine Sodium 112 Mcg Tabs (Levothyroxine sodium) .Marland Kitchen.. 1 by mouth daily 6)  Lancets Misc (Lancets) .... 3 lancets qday 7)  Carvedilol 6.25 Mg Tabs (Carvedilol) .Marland Kitchen.. 1 tab by mouth two times daily 8)  Crestor 20 Mg Tabs (Rosuvastatin calcium) .Marland Kitchen.. 1 by mouth at bedtime 9)  Triamcinolone Acetonide 0.1 % Crea (Triamcinolone acetonide) .... Aaa bid 10)  Permethrin 5 % Crea (Permethrin) .... Apply from head to toe and leave on for 8-14 hours then wash off. was all bedding and clothing.  reapply in 1 week as needed. 1 large tub 11)  Insulin Syringe Ult Thin Short 30g X 5/16" 1 Ml Misc (Insulin syringe-needle u-100) .... Use as directed. 12)  Mobic 15 Mg Tabs (Meloxicam) .Marland Kitchen.. 1 tablet by mouth daily for 2 weeks, then as needed for pain. 13)  Voltaren 1 % Gel (Diclofenac sodium) 14)  Voltaren 1 % Gel (Diclofenac sodium) .... Apply 2g to  shoulder or 4 g to knees qid as needed for pain. dispense 100 g.  Patient Instructions: 1)  30 pounds is too much!   2)  Start using the band with the exercises as instructed today.  (biceps curls with arms extended, hand twists with arms extended) 3)  Continue to use voltaren gel as well as mobic as needed. 4)  Return as needed.

## 2010-09-25 NOTE — Progress Notes (Signed)
Summary: refill  Phone Note Refill Request Call back at [phone] Message from:  Patient  Refills Requested: Medication #1:  LEVOTHYROXINE SODIUM 112 MCG  TABS 1 by mouth daily pt is out  Initial call taken by: De Nurse,  February 02, 2009 4:07 PM  Follow-up for Phone Call        rx sent Follow-up by: Lequita Asal  MD,  February 02, 2009 6:43 PM      Prescriptions: LEVOTHYROXINE SODIUM 112 MCG  TABS (LEVOTHYROXINE SODIUM) 1 by mouth daily  #30 Tablet x 8   Entered and Authorized by:   Lequita Asal  MD   Signed by:   Lequita Asal  MD on 02/02/2009   Method used:   Electronically to        [id number] (retail)       [address]       [place], [place]  [address]       Ph: [phone] or [phone]       Fax: [phone]   RxID:   [id number]

## 2010-09-25 NOTE — Letter (Signed)
Summary: Results Follow Up Letter  [facility]  [address]   [place], [place] [address]   Phone: [phone]  Fax: [phone]    03/19/2007 MRN: [id number]  [address] [place], [place]  [address]  Dear Ms. Randleman,   The following are the results of your recent test(s):  Cholesterol LDL(Bad cholesterol):   127  Your goal is less than:100         Please make an appointment to discuss your cholesterol in the next month _________________________________________________________ Other Tests: Thyroid normal Liver and kidney tests normal  _________________________________________________________  Please call [phone] for an appointment _________________________________________________________ _________________________________________________________ _________________________________________________________  Sincerely,  Altamese Cabal MD [facility]          Appended Document: Results Follow Up Letter patient letter mailed

## 2010-09-25 NOTE — Progress Notes (Signed)
  Phone Note From Pharmacy   Summary of Call: Refill Amlodipine. No record of Amlodipine Rx on current list.  Present on Dr. Tiajuana Amass, but removed from [facility] list.  Denied. Initial call taken by: Towana Badger MD,  April 17, 2007 12:22 PM

## 2010-09-25 NOTE — Letter (Signed)
Summary: Lipid Letter  [facility]  [address]   [place], [place] [address]   Phone: [phone]  Fax: [phone]    03/01/2010  [facility] [address] [place], [place]  [address]  Dear Ms. Riggin:  We have carefully reviewed your last lipid profile from 03/01/2010 and the results are noted below with a summary of recommendations for lipid management.    Cholesterol:       99     Goal: <200   HDL "good" Cholesterol:   32     Goal: >40   LDL "bad" Cholesterol:   36     Goal: <100   Triglycerides:       157     Goal: <150    You are very close, you need to increase your exercise to increase your HDL:    TLC Diet (Therapeutic Lifestyle Change): Saturated Fats & Transfatty acids should be kept < 7% of total calories ***Reduce Saturated Fats Polyunstaurated Fat can be up to 10% of total calories Monounsaturated Fat Fat can be up to 20% of total calories Total Fat should be no greater than 25-35% of total calories Carbohydrates should be 50-60% of total calories Protein should be approximately 15% of total calories Fiber should be at least 20-30 grams a day ***Increased fiber may help lower LDL Total Cholesterol should be < 200mg /day Consider adding plant stanol/sterols to diet (example: Benacol spread) ***A higher intake of unsaturated fat may reduce Triglycerides and Increase HDL    Adjunctive Measures (may lower LIPIDS and reduce risk of Heart Attack) include: Aerobic Exercise (20-30 minutes 3-4 times a week) Limit Alcohol Consumption Weight Reduction Aspirin 75-81 mg a day by mouth (if not allergic or contraindicated) Dietary Fiber 20-30 grams a day by mouth     Current Medications: 1)    Bayer Childrens Aspirin 81 Mg Chew (Aspirin) .... Take 1 tablet by mouth once a day 2)    Lantus 100 Unit/ml Soln (Insulin glargine) .... 70 units injected subcutaneously qhs 3)    Lisinopril-hydrochlorothiazide 20-12.5 Mg Tabs  (Lisinopril-hydrochlorothiazide) .... 2  tablets by mouth qday 4)    Levothyroxine Sodium 112 Mcg  Tabs (Levothyroxine sodium) .Marland Kitchen.. 1 by mouth daily 5)    Crestor 20 Mg  Tabs (Rosuvastatin calcium) .Marland Kitchen.. 1 by mouth at bedtime 6)    Insulin Syringe Ult Thin Short 30g X 5/16" 1 Ml Misc (Insulin syringe-needle u-100) .... Use as directed. 7)    Lancets   Misc (Lancets) .... 3 lancets qday 8)    Nexium 20 Mg Cpdr (Esomeprazole magnesium) .... Take 1 tablet every day. 9)    Norco 5-325 Mg Tabs (Hydrocodone-acetaminophen) .... As needed for pain 10)    Metoprolol Tartrate 100 Mg Tabs (Metoprolol tartrate) .... One tab by mouth bid  If you have any questions, please call. We appreciate being able to work with you.   Sincerely,    [facility] Rodney Langton MD  Appended Document: Lipid Letter mailed

## 2010-09-25 NOTE — Miscellaneous (Signed)
Summary: [place] [employer] Form  Form dropped off from [facility].  Please fax when completed. Bradly Bienenstock  November 28, 2008 11:15 AM  form placed in pcp chart box.[place] RN  November 28, 2008 11:16 AM  placed in pile to be scanned. then please contact patient for pick up.  Lequita Asal  MD  November 28, 2008 1:21 PM

## 2010-09-25 NOTE — Letter (Signed)
Summary: Results Follow-up Letter  [facility]  [address]   [place], [place] [address]   Phone: [phone]  Fax: [phone]    08/12/2009  [address] [place], [place]  [address]  Dear Ms. Eischeid,   All of your recent lab tests were normal. Please call if you have questions. Have a happy holidays and New Year.   Sincerely,  Lequita Asal  MD [facility]           Appended Document: Results Follow-up Letter mailed.

## 2010-09-25 NOTE — Assessment & Plan Note (Signed)
Summary: f/u DM, HTN   Vital Signs:  Patient profile:   65 year old female Weight:      277.7 pounds Temp:     98.7 degrees F oral Pulse rate:   58 / minute BP sitting:   171 / 84  (left arm)  Vitals Entered By: Alphia Kava (January 30, 2009 8:24 AM)  Serial Vital Signs/Assessments:  Time      Position  BP       Pulse  Resp  Temp     By                     164/82                         Lequita Asal  MD  CC: f/u bp,dm Is Patient Diabetic? Yes   Primary Care Provider:  Lequita Asal  MD  CC:  f/u bp and dm.  History of Present Illness: Patient 65 y/o female with multiple medical problems including DM, HTN, hypothyroidism, who presents for routine follow up.   DM- CBGs have been elevated. currently on lantus 60 units two times a day. denies polyuria, polydipsia. CBGs with fastings in 170s to 200s. reports adherence to medication regimen. has not been going to gym recently  HTN- BP elevated. taking all meds. denies CP, palpitations, SOB, increased edema, blurred vision, headache. on lisinopril/hctz and carvedilol  Arthritis- Pt with continued shoulder and knee pain. Limited ROM. unable to exercise as much 2/2 pain. reports relief with meloxicam.  still not interested in injections  Social- doing ok following death of her husband. had to move in with her son. now lives in [place].   Habits & Providers  Alcohol-Tobacco-Diet     Tobacco Status: never  Current Medications (verified): 1)  Bayer Childrens Aspirin 81 Mg Chew (Aspirin) .... Take 1 Tablet By Mouth Once A Day 2)  Gemfibrozil 600 Mg Tabs (Gemfibrozil) .... Take 1 Tablet By Mouth Twice A Day 3)  Lantus 100 Unit/ml Soln (Insulin Glargine) .... 60 Units Inj [place] Two Times A Day. Dispense 4 Vials 4)  Lisinopril-Hydrochlorothiazide 20-12.5 Mg Tabs (Lisinopril-Hydrochlorothiazide) .... 2  Tablets By Mouth Qday 5)  Levothyroxine Sodium 112 Mcg  Tabs (Levothyroxine Sodium) .Marland Kitchen.. 1 By Mouth Daily 6)  Lancets   Misc  (Lancets) .... 3 Lancets Qday 7)  Carvedilol 6.25 Mg  Tabs (Carvedilol) .Marland Kitchen.. 1 Tab By Mouth Two Times Daily 8)  Crestor 20 Mg  Tabs (Rosuvastatin Calcium) .Marland Kitchen.. 1 By Mouth At Bedtime 9)  Triamcinolone Acetonide 0.1 %  Crea (Triamcinolone Acetonide) .... Aaa Bid 10)  Permethrin 5 % Crea (Permethrin) .... Apply From Head To Toe and Leave On For 8-14 Hours Then Wash Off. Was All Bedding and Clothing.  Reapply in 1 Week As Needed. 1 Large Tub 11)  Insulin Syringe Ult Thin Short 30g X 5/16" 1 Ml Misc (Insulin Syringe-Needle U-100) .... Use As Directed. 12)  Mobic 15 Mg Tabs (Meloxicam) .Marland Kitchen.. 1 Tablet By Mouth Daily For 2 Weeks, Then As Needed For Pain. 13)  Voltaren 1 % Gel (Diclofenac Sodium) 14)  Voltaren 1 % Gel (Diclofenac Sodium) .... Apply 2g To Shoulder or 4 G To Knees Qid As Needed For Pain. Dispense 100 G.  Allergies (verified): No Known Drug Allergies  Physical Exam  General:  NAD, obese, alert. BP elevated, patient bradycardic Mouth:  fair dentition and teeth missing.   Neck:  No deformities, masses, or  tenderness noted. no JVD Lungs:  Normal respiratory effort, chest expands symmetrically. Lungs are clear to auscultation, no crackles or wheezes. Heart:  bradycardic and regular rhythm. S1 and S2 normal without gallop, murmur, click, rub or other extra sounds. Abdomen:  Bowel sounds positive,abdomen soft and non-tender without masses, organomegaly or hernias noted. Msk:  Shoulder: Inspection reveals no abnormalities, atrophy or asymmetry. Palpation shows no tenderness along clavicle or AC joint.   + TTP over bicipital groove. ROM is full in all planes.      Pulses:  2+ distal pulses Extremities:  no edema Psych:  Oriented X3, memory intact for recent and remote, normally interactive, and good eye contact.    Impression & Recommendations:  Problem # 1:  DIABETES MELLITUS, TYPE II, UNCONTROLLED (ICD-250.02) Assessment Deteriorated  A1C trending up, not unexpectedly.  encouraged patient to resume going to gym. patient to titrate up lantus to fasting CBGs of >150. BP not at goal either. however, patient opposed to changes at this time. continue daily aspirin, and gemfibrozil/crestor for lipids.   Her updated medication list for this problem includes:    Bayer Childrens Aspirin 81 Mg Chew (Aspirin) .Marland Kitchen... Take 1 tablet by mouth once a day    Lantus 100 Unit/ml Soln (Insulin glargine) .Marland KitchenMarland KitchenMarland KitchenMarland Kitchen 60 units inj [place] two times a day. dispense 4 vials    Lisinopril-hydrochlorothiazide 20-12.5 Mg Tabs (Lisinopril-hydrochlorothiazide) .Marland Kitchen... 2  tablets by mouth qday  Orders: A1C-[facility] ([address]073) [facility]- Est  Level 4 ([address]62)  Problem # 2:  HYPERTENSION, BENIGN SYSTEMIC (ICD-401.1) Assessment: Deteriorated patient bradycardic, so cannot increase coreg. may need 4th agent. hold off on addition for now.   Her updated medication list for this problem includes:    Lisinopril-hydrochlorothiazide 20-12.5 Mg Tabs (Lisinopril-hydrochlorothiazide) .Marland Kitchen... 2  tablets by mouth qday    Carvedilol 6.25 Mg Tabs (Carvedilol) .Marland Kitchen... 1 tab by mouth two times daily  Problem # 3:  ARTHRITIS, DEGENERATIVE (ICD-715.09) Assessment: Deteriorated significant pain; however, patient very opposed to steroid injections, will try. volaren gel.   Her updated medication list for this problem includes:    Bayer Childrens Aspirin 81 Mg Chew (Aspirin) .Marland Kitchen... Take 1 tablet by mouth once a day    Mobic 15 Mg Tabs (Meloxicam) .Marland Kitchen... 1 tablet by mouth daily for 2 weeks, then as needed for pain.  Problem # 4:  BICEPS TENDINITIS, LEFT (ICD-726.12) remains unresolved as patient as not being doing exercises and refuses injection. recommend resuming exercises to maintain ROM.   Orders: [facility]- Est  Level 4 (69485)  Other Orders: Comp Met-[facility] ([id number])  Patient Instructions: 1)  Follow up with Dr. Lanier Prude in 3 months regarding diabetes 2)  Keep an eye on your blood pressures. if it is higher than 140/90 several times,  let me know 3)  Check your blood sugars regularly. If your readings are usually above: 200 or below 70 you should contact our office.  Prescriptions: VOLTAREN 1 % GEL (DICLOFENAC SODIUM) apply 2g to shoulder or 4 g to knees QID as needed for pain. dispense 100 g.  #1 x 3   Entered and Authorized by:   Lequita Asal  MD   Signed by:   Lequita Asal  MD on 01/30/2009   Method used:   Electronically to        [id number] (retail)       [address]       [place], [place]  [address]  Ph: [phone] or [phone]       Fax: [phone]   RxID:   [id number]   Laboratory Results   Blood Tests   Date/Time Received: January 30, 2009 9:10 AM  Date/Time Reported: January 30, 2009 9:58 AM   HGBA1C: 8.6%   (Normal Range: Non-Diabetic - 3-6%   Control Diabetic - 6-8%)  Comments: Arlyss Repress CMA,  January 30, 2009 9:58 AM

## 2010-09-25 NOTE — Letter (Signed)
Summary: Generic Letter  [facility]  [address]   [place], [place] [address]   Phone: [phone]  Fax: [phone]    02/18/2008 MRN: [id number]  [address] [place], [place]  [address]  Dear Ms. Hanks,  Your liver enzymes were normal.  Your BUN was a little elevated and we should repeat the test the next time you come into clinic.    Sodium                    141 mEq/L                   135-145   Potassium                 3.8 mEq/L                   3.5-5.3   Chloride                  99 mEq/L                    96-112   CO2                       26 mEq/L                    19-32   Glucose              [H]  128 mg/dL                   52-84   BUN                  [H]  33 mg/dL                    1-32   Creatinine                1.01 mg/dL                  0.40-1.20   Bilirubin, Total          0.3 mg/dL                   4.4-0.1   Alkaline Phosphatase      63 U/L                      39-117   AST/SGOT                  11 U/L                      0-37   ALT/SGPT                  <8 U/L                      0-35   Total Protein             6.9 g/dL                    0.2-7.2   Albumin                   4.2 g/dL  3.5-5.2   Calcium                   9.7 mg/dL                   1.6-10.9  Your TSH was low which means that your Synthroid dose is too high.  I have sent a dose of 125 instead of 137 into [facility] on [address].  Please make an appointment in 6 WEEKS to have your TSH rechecked and let us know how you are doing.  Tests: (2) TSH (23280)   TSH                  [L]  0.168 uIU/mL                0.350-5.50     ***Test methodology is 3rd generation TSH***  Your "bad" cholesterol was just a tad high.  Your goal is less than 70.  With your new exercise program, I imagine you will get there by yourself and we may not have to increase your medication.  Please continue to do the great job you have been doing with  exercise and we can recheck this in 3 months.   Tests: (3) LDL, Direct ([address])   LDL, Direct               79 mg/dL   Sincerely,     Rolm Gala MD [facility]

## 2010-09-25 NOTE — Assessment & Plan Note (Signed)
Summary: itchy rash x 3 months   Vital Signs:  Patient Profile:   65 Years Old Female Height:     67.5 inches (171.45 cm) Weight:      273.4 pounds (124.27 kg) BMI:     42.34 Temp:     98.5 degrees F (36.94 degrees C) oral BP sitting:   126 / 80  (right arm) Cuff size:   large  Pt. in pain?   no  Vitals Entered By: Garen Grams LPN (April 20, 2008 8:37 AM)                  PCP:  Adrian Blackwater  MD  Chief Complaint:  itching all over x months.  History of Present Illness: itching x 3 months.  Recently started to wake her up at night. Has tried Triamcinolone cream and Zytrec. Zytrec helps a little. Grandson had scabies -was treated and went away. Her daughter also had this and was treated. No new soaps.  LFTs checked in June were normal and she was itching at that time as well. Does not have a rash but feels itchy and when scratches creates red scabs. No new detergents. No plant contact. No pets at home. Grandson does stay at her house sometimes. Hot water makes it feel bettter. Worst on stomach, breasts, back, buttocks, arms, hand.         Physical Exam  General:     Well-developed,well-nourished,in no acute distress; alert,appropriate and cooperative throughout examination Skin:     Red scab like bumps on abdomen, breasts, chest, back. No drainage. Dry bumps. Stretch markes on abdomen from having children. Some hemangioma on back.     Impression & Recommendations:  Problem # 1:  SCABIES (ICD-133.0) Assessment: New Prior itching seems to have been due to scabies. In adults pattern of scabies/itching often shows up on abdomen and breast. Will treat with Permethrin cream for 8-14 hours. Wash off and then treat again in 1 week if still itching. Zyrtec as needed itching in the meanwhile. Wash all bedding and clothing. Vacuum all floors.  Orders: [facility]- Est Level  3 ([address])   Complete Medication List: 1)  Bayer Childrens Aspirin 81 Mg Chew (Aspirin) .... Take 1  tablet by mouth once a day 2)  Gemfibrozil 600 Mg Tabs (Gemfibrozil) .... Take 1 tablet by mouth twice a day 3)  Glipizide 10 Mg Tabs (Glipizide) .... Take 1/2 tablet daily 4)  Lantus 100 Unit/ml Soln (Insulin glargine) .... 56 units inj [place] at bedtime. dispense 3 vials. 5)  Lisinopril-hydrochlorothiazide 20-12.5 Mg Tabs (Lisinopril-hydrochlorothiazide) .... 2  tablets by mouth qday 6)  Synthroid 125 Mcg Tabs (Levothyroxine sodium) .Marland Kitchen.. 1 by mouth daily 7)  Glucophage 1000 Mg Tabs (Metformin hcl) .... Take 1 tablet twice daily 8)  Lancets Misc (Lancets) .... 3 lancets qday 9)  Coreg 12.5 Mg Tabs (Carvedilol) .Marland Kitchen.. 1 by mouth bid 10)  Crestor 20 Mg Tabs (Rosuvastatin calcium) .Marland Kitchen.. 1 by mouth at bedtime 11)  Triamcinolone Acetonide 0.1 % Crea (Triamcinolone acetonide) .... Aaa bid 12)  Permethrin 5 % Crea (Permethrin) .... Apply from head to toe and leave on for 8-14 hours then wash off. was all bedding and clothing.  reapply in 1 week as needed. 1 large tub   Patient Instructions: 1)  Use the permethrin cream. Apply from head to toe and leave on 8-14 hours. Wash all bedding and clothing to kill any mites. 2)  You may continue using Zytrec as needed itching.  3)  You may use the cream again in 1 week if still having itching.    Prescriptions: PERMETHRIN 5 % CREA (PERMETHRIN) Apply from head to toe and leave on for 8-14 hours then wash off. Was all bedding and clothing.  Reapply in 1 week as needed. 1 large tub  #1 x 0   Entered and Authorized by:   Alanda Amass MD   Signed by:   Alanda Amass MD on 04/20/2008   Method used:   Electronically to        [id number] (retail)       [address]       [place], [place]  [address]       Ph: [phone] or [phone]       Fax: [phone]   RxID:   [id number]  ]

## 2010-09-25 NOTE — Assessment & Plan Note (Signed)
Summary: f/u/kh   Vital Signs:  Patient profile:   65 year old female Weight:      262.8 pounds Temp:     98.5 degrees F oral Pulse rate:   86 / minute Pulse rhythm:   regular BP sitting:   119 / 77  (left arm) Cuff size:   large  Vitals Entered By: Loralee Pacas CMA (March 15, 2010 11:10 AM)   Primary Care Provider:  Rodney Langton MD   History of Present Illness: 76 F here for fu DM2  Forgot meter today.   AM CBGs: 80-150.  No lows. 8Pm CBGs: 150-300, averages low 200s.  No lows.  Likes qHS lantus much more than two times a day.  No questions.  Getting mammo today.  Current Medications (verified): 1)  Bayer Childrens Aspirin 81 Mg Chew (Aspirin) .... Take 1 Tablet By Mouth Once A Day 2)  Lantus 100 Unit/ml Soln (Insulin Glargine) .... 70 Units Injected Subcutaneously Qhs 3)  Lisinopril-Hydrochlorothiazide 20-12.5 Mg Tabs (Lisinopril-Hydrochlorothiazide) .... 2  Tablets By Mouth Qday 4)  Levothyroxine Sodium 112 Mcg  Tabs (Levothyroxine Sodium) .Marland Kitchen.. 1 By Mouth Daily 5)  Crestor 20 Mg  Tabs (Rosuvastatin Calcium) .Marland Kitchen.. 1 By Mouth At Bedtime 6)  Insulin Syringe Ult Thin Short 30g X 5/16" 1 Ml Misc (Insulin Syringe-Needle U-100) .... Use As Directed. 7)  Lancets   Misc (Lancets) .... 3 Lancets Qday 8)  Nexium 20 Mg Cpdr (Esomeprazole Magnesium) .... Take 1 Tablet Every Day. 9)  Norco 5-325 Mg Tabs (Hydrocodone-Acetaminophen) .... As Needed For Pain 10)  Metoprolol Tartrate 100 Mg Tabs (Metoprolol Tartrate) .... One Tab By Mouth Bid  Allergies (verified): No Known Drug Allergies  Review of Systems       See HPI  Physical Exam  General:  Well-developed,well-nourished,in no acute distress; alert,appropriate and cooperative throughout examination   Impression & Recommendations:  Problem # 1:  DIABETES MELLITUS, TYPE II, CONTROLLED (ICD-250.00) Assessment Improved CBGs ok, need to see A1c.  Next will be in october, she will RTC then.  Will call if needs  refills.  Her updated medication list for this problem includes:    Bayer Childrens Aspirin 81 Mg Chew (Aspirin) .Marland Kitchen... Take 1 tablet by mouth once a day    Lantus 100 Unit/ml Soln (Insulin glargine) .Marland KitchenMarland KitchenMarland KitchenMarland Kitchen 70 units injected subcutaneously qhs    Lisinopril-hydrochlorothiazide 20-12.5 Mg Tabs (Lisinopril-hydrochlorothiazide) .Marland Kitchen... 2  tablets by mouth qday  Orders: [facility]- Est Level  3 ([address])  Problem # 2:  UNSPECIFIED BREAST SCREENING (ICD-V76.10) Assessment: Comment Only Mammo today.  Complete Medication List: 1)  Bayer Childrens Aspirin 81 Mg Chew (Aspirin) .... Take 1 tablet by mouth once a day 2)  Lantus 100 Unit/ml Soln (Insulin glargine) .... 70 units injected subcutaneously qhs 3)  Lisinopril-hydrochlorothiazide 20-12.5 Mg Tabs (Lisinopril-hydrochlorothiazide) .... 2  tablets by mouth qday 4)  Levothyroxine Sodium 112 Mcg Tabs (Levothyroxine sodium) .Marland Kitchen.. 1 by mouth daily 5)  Crestor 20 Mg Tabs (Rosuvastatin calcium) .Marland Kitchen.. 1 by mouth at bedtime 6)  Insulin Syringe Ult Thin Short 30g X 5/16" 1 Ml Misc (Insulin syringe-needle u-100) .... Use as directed. 7)  Lancets Misc (Lancets) .... 3 lancets qday 8)  Nexium 20 Mg Cpdr (Esomeprazole magnesium) .... Take 1 tablet every day. 9)  Norco 5-325 Mg Tabs (Hydrocodone-acetaminophen) .... As needed for pain 10)  Metoprolol Tartrate 100 Mg Tabs (Metoprolol tartrate) .... One tab by mouth bid  Patient Instructions: 1)  Try to decrease the amount of sugar in your  meals.  Decrease the amount of food you eat for dinner. 2)  Don't forget your mammogram today. 3)  Come back to see me in October. 4)  BRING YOUR METER! 5)  -Dr. Karie Schwalbe.  Last HDL:  32 (03/01/2010 8:10:00 PM) HDL Next Due:  1 yr Last LDL:  36 (03/01/2010 8:10:00 PM) LDL Next Due:  1 yr Last HGBA1C:  7.1 (03/01/2010 10:05:39 AM) HGBA1C Next Due:  3 mo

## 2010-09-25 NOTE — Assessment & Plan Note (Signed)
Summary: bx site has pus/[place]/bolden   Vital Signs:  Patient profile:   65 year old female Height:      67.5 inches Weight:      266.9 pounds BMI:     41.33 Temp:     98.6 degrees F oral Pulse rate:   79 / minute BP sitting:   102 / 66  (left arm)  Vitals Entered By: Gladstone Pih (December 22, 2009 10:35 AM) CC: C/O Bx site red with pus Is Patient Diabetic? Yes Did you bring your meter with you today? No Pain Assessment Patient in pain? no        Primary Care Provider:  Lequita Asal  MD  CC:  C/O Bx site red with pus.  History of Present Illness:   Pt presents with concern for infection from left calf biopsy site. 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 Monday pt noted yellow drainage from site, + tenderness, mild swelling, and increased redness. Denies fever, joint pain in left knee. Pt noted that more lesions continue to come up near her ankles Pt has not picked up steroid cream sent by Dr. Lanier Prude   History of MRSAX2,  s/p ankle surgery requiring IV antibiotics   Habits & Providers  Alcohol-Tobacco-Diet     Tobacco Status: never  Current Medications (verified): 1)  Bayer Childrens Aspirin 81 Mg Chew (Aspirin) .... Take 1 Tablet By Mouth Once A Day 2)  Lantus 100 Unit/ml Soln (Insulin Glargine) .... 30 Units Inj [place] Two Times A Day. Dispense 4 Vials 3)  Lisinopril-Hydrochlorothiazide 20-12.5 Mg Tabs (Lisinopril-Hydrochlorothiazide) .... 2  Tablets By Mouth Qday 4)  Levothyroxine Sodium 112 Mcg  Tabs (Levothyroxine Sodium) .Marland Kitchen.. 1 By Mouth Daily 5)  Crestor 20 Mg  Tabs (Rosuvastatin Calcium) .Marland Kitchen.. 1 By Mouth At Bedtime 6)  Mobic 15 Mg Tabs (Meloxicam) .Marland Kitchen.. 1 Tablet By Mouth Daily For 2 Weeks, Then As Needed For Pain. 7)  Insulin Syringe Ult Thin Short 30g X 5/16" 1 Ml Misc (Insulin Syringe-Needle U-100) .... Use As Directed. 8)  Lancets   Misc (Lancets) .... 3 Lancets Qday 9)  Glyburide-Metformin 2.5-500 Mg Tabs  (Glyburide-Metformin) .... One Tab By Mouth Bid 10)  Triamcinolone Acetonide 0.1 % Oint (Triamcinolone Acetonide) .... Apply To Affected Areas Twice A Day. Disp 60 G. 11)  Bactrim Ds 800-160 Mg Tabs (Sulfamethoxazole-Trimethoprim) .... Take 1 Tab By Mouth By Mouth Two Times A Day X 10 Days  Allergies (verified): No Known Drug Allergies  Past History:  Past Medical History: Last updated: 10/07/2008 Rt. Ankle injury w/ complications including osteomyelitis Hypothyroidism  Hyperlipidemia Hypertension DJD (Added 10/07/2008) Diabetes mellitus, type II (Added 10/07/2008)  Physical Exam  General:  NAD, obese, alert. vitals reviewed.  Skin:  Left calf 0.5cm circular lesion at mid calf, TTP, + erythema, + induration extending 2cm around, yellow pus expressed, no bleeding noted.slighty raised erythematous rash surrounding lesion in circular patterns, no central clearing noted, dry skin     Impression & Recommendations:  Problem # 1:  CELLULITIS AND ABSCESS OF LEG EXCEPT FOOT (ICD-682.6) Assessment New s/p biospy , site appears infected, with surrounding cellulitis. Wound culture done. Started on Bactrim. RTC next week to visualize improvement. Likley needs Dermatoloy referral for peristant lesions Her updated medication list for this problem includes:    Bactrim Ds 800-160 Mg Tabs (Sulfamethoxazole-trimethoprim) .Marland Kitchen... Take 1 tab by mouth by mouth two times a day x 10 days  Orders: Culture, Wound -[facility] ([address]) [facility]- Est Level  3 [phone])  Complete Medication List: 1)  Bayer Childrens Aspirin 81 Mg Chew (Aspirin) .... Take 1 tablet by mouth once a day 2)  Lantus 100 Unit/ml Soln (Insulin glargine) .... 30 units inj  two times a day. dispense 4 vials 3)  Lisinopril-hydrochlorothiazide 20-12.5 Mg Tabs (Lisinopril-hydrochlorothiazide) .... 2  tablets by mouth qday 4)  Levothyroxine Sodium 112 Mcg Tabs (Levothyroxine sodium) .Marland Kitchen.. 1 by mouth daily 5)  Crestor 20 Mg Tabs (Rosuvastatin calcium)  .Marland Kitchen.. 1 by mouth at bedtime 6)  Mobic 15 Mg Tabs (Meloxicam) .Marland Kitchen.. 1 tablet by mouth daily for 2 weeks, then as needed for pain. 7)  Insulin Syringe Ult Thin Short 30g X 5/16" 1 Ml Misc (Insulin syringe-needle u-100) .... Use as directed. 8)  Lancets Misc (Lancets) .... 3 lancets qday 9)  Glyburide-metformin 2.5-500 Mg Tabs (Glyburide-metformin) .... One tab by mouth bid 10)  Triamcinolone Acetonide 0.1 % Oint (Triamcinolone acetonide) .... Apply to affected areas twice a day. disp 60 g. 11)  Bactrim Ds 800-160 Mg Tabs (Sulfamethoxazole-trimethoprim) .... Take 1 tab by mouth by mouth two times a day x 10 days  Patient Instructions: 1)  Take the antibiotic twice a day for 10 days 2)  You can use Bacitracin ointment over the wound 3)  Do not put the steroid cream over the opening of your leg 4)  Return next Monday to have your leg checked 5)  If you develop fever, increased pain or swelling in your leg then go to the ER this weekend Prescriptions: BACTRIM DS 800-160 MG TABS (SULFAMETHOXAZOLE-TRIMETHOPRIM) Take 1 tab by mouth by mouth two times a day x 10 days  #20 x 0   Entered and Authorized by:   Milinda Antis MD   Signed by:   Milinda Antis MD on 12/22/2009   Method used:   Electronically to        [facility]  [address] (retail)       [address]       [place], [place]  [address]       Ph: [phone]       Fax: [phone]   RxID:   [id number]

## 2010-09-25 NOTE — Assessment & Plan Note (Signed)
Summary: fu bp wp  Medications Added [facility] ASPIRIN 81 MG CHEW (ASPIRIN) Take 1 tablet by mouth once a day COZAAR 100 MG TABS (LOSARTAN POTASSIUM)  COZAAR 100 MG TABS (LOSARTAN POTASSIUM) take one tab by mouth qday GEMFIBROZIL 600 MG TABS (GEMFIBROZIL) Take 1 tablet by mouth twice a day GLIPIZIDE 10 MG TABS (GLIPIZIDE) take 1/2 tablet daily LANTUS 100 UNIT/ML SOLN (INSULIN GLARGINE) 54 units inj [place] qhs LANTUS 100 UNIT/ML SOLN (INSULIN GLARGINE)  LISINOPRIL-HYDROCHLOROTHIAZIDE 20-12.5 MG TABS (LISINOPRIL-HYDROCHLOROTHIAZIDE)  LISINOPRIL-HYDROCHLOROTHIAZIDE 20-12.5 MG TABS (LISINOPRIL-HYDROCHLOROTHIAZIDE) one tablet by mouth qday METOPROLOL TARTRATE 100 MG TABS (METOPROLOL TARTRATE)  METOPROLOL TARTRATE 100 MG TABS (METOPROLOL TARTRATE) one tablet by mouth bid SYNTHROID 137 MCG TABS (LEVOTHYROXINE SODIUM) one tablet by mouth qday SYNTHROID 137 MCG TABS (LEVOTHYROXINE SODIUM)  GLUCOPHAGE 1000 MG  TABS (METFORMIN HCL) take 1 tablet twice daily LANCETS   MISC (LANCETS) 3 lancets qday        Vital Signs:  Patient Profile:   65 Years Old Female Height:     67.5 inches (171.45 cm) Weight:      286 pounds (130 kg) BMI:     44.29 Temp:     97.1 degrees F (36.17 degrees C) Pulse rate:   68 / minute BP sitting:   131 / 66  (left arm)  Pt. in pain?   no  Vitals Entered By: Tomasa Rand (March 18, 2007 1:53 PM)              Is Patient Diabetic? Yes    PCP:  Adrian Blackwater  MD  Chief Complaint:  B/P and DM f/u.  History of Present Illness: S: Patient is a 65 y/o female with IDDM, Obesity, hypertension who presents today for d/u of these issues. 1. DM-  Patients reports that about 2 weeks ago she had some hypoglycemic episodes with BS 48-70 (most occured in morning except one day she had episodes throughout the day).  She is not sure why these happened b/c she had not changed her eating pattern or meds.  She is concerned she may be on too much medicine, although  lately she has had well-controlled sugars with fastings ranging from 96-126.  Patient states she does not have any neuropathy symtpoms and that her granddaughter checks her feet for her.    Hypertension History:      She denies headache, chest pain, palpitations, dyspnea with exertion, orthopnea, PND, peripheral edema, visual symptoms, neurologic problems, syncope, and side effects from treatment.  She notes no problems with any antihypertensive medication side effects.        Positive major cardiovascular risk factors include female age 39 years old or older, diabetes, hyperlipidemia, and hypertension.  Negative major cardiovascular risk factors include non-tobacco-user status.       Past Medical History:    Reviewed history from 10/23/2006 and no changes required:       Dyslipidemia (12/07), Rt. Ankle injury w/ comp. Incl. Osteo., TSH 0.052 8/06, urine prot/creat. 0.03 (,0.15) 8/06  Past Surgical History:    Reviewed history from 10/23/2006 and no changes required:       Back surg. For ruptured disc -, clinic PFTs (7/99) WNL -, Echo (7/99) EF 60-70%, mod. LVH.  nl sys fuxn. -, Hysterectomy (8/87)  fibroids -, Neck mass - to surgery - 12/29/2001, nl cardiac cath - 05/27/2003, Rt. Ankle tib/talar fusion (7/97) -, Rt. Breast bx (10/98) fatty necrosis, no malig. -, Rt. Carpal tunnel release (`93) -, Tonsillectomy -   Family  History:    Reviewed history from 10/23/2006 and no changes required:       Diabetes 1st degree- brother, Father first MI in early 70`s, Mother died 38 yo of Multiple Myeloma, h/o CVA`s, CHF  Social History:    Reviewed history from 10/23/2006 and no changes required:       [place].  Married to [employer] who has Parkinson's, placed in [facility] 02/2004.  Works in the school system. No tobacco, no etoh.  Grandson living with pt.     Physical Exam  General:     Well-developed,well-nourished,in no acute distress; alert,appropriate and cooperative throughout  examination. overweight-appearing.   Head:     normocephalic and atraumatic.   Eyes:     vision grossly intact, pupils equal, pupils round, pupils reactive to light, corneas and lenses clear, and no injection.   Mouth:     fair dentition and teeth missing.   Lungs:     Normal respiratory effort, chest expands symmetrically. Lungs are clear to auscultation, no crackles or wheezes. Heart:     Normal rate and regular rhythm. S1 and S2 normal without gallop, murmur, click, rub or other extra sounds. Msk:     feet normal without any calluses or sores Extremities:     No clubbing, cyanosis, edema, or deformity noted with normal full range of motion of all joints.    Diabetes Management Exam:       Nails:          Left foot: normal          Right foot: normal    Eye Exam:       Eye Exam done elsewhere          Results: normal     Impression & Recommendations:  Problem # 1:  DIABETES MELLITUS II, UNCOMPLICATED (ICD-250.00) Assessment: Unchanged A1C at goal.  will decrease Glipizide to 1/2 tablet daily due to hypoglycemia.  Asked patient to record eating pattern when she has hypoglycemic episode.   Patient to continue Lantus at 54 units qday. Her updated medication list for this problem includes:    [employer] Childrens Aspirin 81 Mg Chew (Aspirin) .Marland Kitchen... Take 1 tablet by mouth once a day    Cozaar 100 Mg Tabs (Losartan potassium) .Marland Kitchen... Take one tab by mouth qday    Glipizide 10 Mg Tabs (Glipizide) .Marland Kitchen... Take 1/2 tablet daily    Lantus 100 Unit/ml Soln (Insulin glargine) .Marland Kitchen... 54 units inj [place] qhs    Lisinopril-hydrochlorothiazide 20-12.5 Mg Tabs (Lisinopril-hydrochlorothiazide) ..... One tablet by mouth qday    Glucophage 1000 Mg Tabs (Metformin hcl) .Marland Kitchen... Take 1 tablet twice daily  Orders: A1C-[facility] (16109) Comp Met-[facility] (60454-09811) [facility]- Est  Level 4 (91478)   Problem # 2:  HYPOTHYROIDISM, UNSPECIFIED (ICD-244.9) Assessment: Unchanged No symptoms of hyper or hypo thyroidism Her  updated medication list for this problem includes:    Synthroid 137 Mcg Tabs (Levothyroxine sodium) ..... One tablet by mouth qday  Orders: TSH-[facility] (29562-13086) [facility]- Est  Level 4 (57846)   Problem # 3:  HYPERTENSION, BENIGN SYSTEMIC (ICD-401.1) Assessment: Unchanged BP at goal. Cont current med Her updated medication list for this problem includes:    Cozaar 100 Mg Tabs (Losartan potassium) .Marland Kitchen... Take one tab by mouth qday    Lisinopril-hydrochlorothiazide 20-12.5 Mg Tabs (Lisinopril-hydrochlorothiazide) ..... One tablet by mouth qday    Metoprolol Tartrate 100 Mg Tabs (Metoprolol tartrate) ..... One tablet by mouth bid  Orders: [facility]- Est  Level 4 (96295)  Problem # 4:  HYPERLIPIDEMIA (ICD-272.4) Assessment: Unchanged Will need FLP (unable to check today b/c has had <1 yr ago) Her updated medication list for this problem includes:    Gemfibrozil 600 Mg Tabs (Gemfibrozil) .Marland Kitchen... Take 1 tablet by mouth twice a day  Orders: Direct LDL-[facility] (29528-41324) [facility]- Est  Level 4 (40102)   Medications Added to Medication List This Visit: 1)  Bayer Childrens Aspirin 81 Mg Chew (Aspirin) .... Take 1 tablet by mouth once a day 2)  Cozaar 100 Mg Tabs (Losartan potassium) 3)  Cozaar 100 Mg Tabs (Losartan potassium) .... Take one tab by mouth qday 4)  Gemfibrozil 600 Mg Tabs (Gemfibrozil) .... Take 1 tablet by mouth twice a day 5)  Glipizide 10 Mg Tabs (Glipizide) .... Take 1/2 tablet daily 6)  Lantus 100 Unit/ml Soln (Insulin glargine) .... 54 units inj [place] qhs 7)  Lantus 100 Unit/ml Soln (Insulin glargine) 8)  Lisinopril-hydrochlorothiazide 20-12.5 Mg Tabs (Lisinopril-hydrochlorothiazide) 9)  Lisinopril-hydrochlorothiazide 20-12.5 Mg Tabs (Lisinopril-hydrochlorothiazide) .... One tablet by mouth qday 10)  Metoprolol Tartrate 100 Mg Tabs (Metoprolol tartrate) 11)  Metoprolol Tartrate 100 Mg Tabs (Metoprolol tartrate) .... One tablet by mouth bid 12)  Synthroid 137 Mcg Tabs (Levothyroxine  sodium) .... One tablet by mouth qday 13)  Synthroid 137 Mcg Tabs (Levothyroxine sodium) 14)  Glucophage 1000 Mg Tabs (Metformin hcl) .... Take 1 tablet twice daily 15)  Lancets Misc (Lancets) .... 3 lancets qday  Hypertension Assessment/Plan:      The patient's hypertensive risk group is category C: Target organ damage and/or diabetes.  Today's blood pressure is 131/66.  Her blood pressure goal is < 130/80.   Patient Instructions: 1)  next time you have low blood sugars, record what you had ate in the previous 12 hours and how long it had been since you last ate 2)  decrease Glucotrol to 5 mg daily (take 1/2 of 10 mg tablet) 3)  f/u 3 months    Prescriptions: LANTUS 100 UNIT/ML SOLN (INSULIN GLARGINE) 54 units inj [place] qhs  #0 x 0   Entered and Authorized by:   Altamese Cabal MD   Signed by:   Altamese Cabal MD on 03/19/2007   Method used:   Electronically sent to ...       [facility]       [address]       [place], [place]  [address]       Ph: [phone] or [phone]       Fax: [phone]   RxID:   [id number] LANCETS   MISC (LANCETS) 3 lancets qday  #90 x 3   Entered and Authorized by:   Altamese Cabal MD   Signed by:   Altamese Cabal MD on 03/19/2007   Method used:   Electronically sent to ...       [facility]       [address]       [place], [place]  [address]       Ph: [phone] or [phone]       Fax: [phone]   RxID:   [id number] GLIPIZIDE 10 MG TABS (GLIPIZIDE) take 1/2 tablet daily  #30 x 3   Entered and Authorized by:   Altamese Cabal MD   Signed by:   Altamese Cabal MD on 03/19/2007   Method used:   Electronically sent to .Marland KitchenMarland Kitchen  [facility]       [address]       [place], [place]  [address]       Ph: [phone] or [phone]       Fax: [phone]   RxID:   [id number] GEMFIBROZIL 600 MG TABS (GEMFIBROZIL) Take 1 tablet  by mouth twice a day  #60 x 3   Entered and Authorized by:   Altamese Cabal MD   Signed by:   Altamese Cabal MD on 03/19/2007   Method used:   Electronically sent to ...       [facility] [facility]       [address]       [place], [place]  [address]       Ph: [phone] or [phone]       Fax: [phone]   RxID:   [id number]      Laboratory Results   Blood Tests   Date/Time Recieved: March 18, 2007 1:54 PM  Date/Time Reported: March 18, 2007 2:04 PM   HGBA1C: 6.8%   (Normal Range: Non-Diabetic - 3-6%   Control Diabetic - 6-8%)  Comments: ...............test performed by......Marland KitchenBonnie A. [place], [place] (ASCP)

## 2010-09-25 NOTE — Assessment & Plan Note (Signed)
Summary: dm and shoulder pain wp   Vital Signs:  Patient Profile:   65 Years Old Female Height:     67.5 inches (171.45 cm) Weight:      271.0 pounds BMI:     41.97 Temp:     98.2 degrees F Pulse rate:   72 / minute BP sitting:   129 / 73  Pt. in pain?   yes    Location:   l [facility]    Intensity:   5  Vitals Entered By: Starleen Blue RN (June 01, 2008 11:11 AM)                  Visit Type:  office visit PCP:  Lequita Asal  MD  Chief Complaint:   shoulder pain and DM.  History of Present Illness: 65 yo F with DM, HTN, HLD, hypothyroidism  Hypothyroidism - stable on synthroid.  due for recheck.   L shoulder pain- for the past 4 months patient has had intermittent pain. no relief with NSAIDs. she has not tried heat or ice. denies trauma. pain occasional travels into her neck. worsened with movement, especially internal rotation against resistance.   h/o R ankle surgery- small palpable screw on right shin with visible area of discoloration overlying it. ?screw moving out of place. no pain. no difficulty with ambulation. no redness or swelling. no fever.   Diabetes Management History:      The patient is a 65 years old female who comes in for evaluation of DM Type 2.  She is (or has been) enrolled in the "Diabetic Education Program".  She states understanding of dietary principles but she is not following the appropriate diet.  No sensory loss is reported.  Self foot exams are being performed.  She is checking home blood sugars.  She says that she is not exercising regularly.        Hypoglycemic symptoms are not occurring.  No hyperglycemic symptoms are reported.        There are no symptoms to suggest diabetic complications.  Other questions/concerns include: CBGs range from 166-416. fasting cbg this morning was 297, and subsequent cbg 311. patient checks cbgs 2-3 times per day. .  Since her last visit, no infections have occurred.  She's had no treatment plan problems.   The following changes have been made to her treatment plan since last visit: insulin dosing and exercise program.  Treatment plan changes were initiated by patient.    Hypertension History:      She denies headache, chest pain, palpitations, dyspnea with exertion, orthopnea, PND, peripheral edema, visual symptoms, neurologic problems, syncope, and side effects from treatment.  She notes no problems with any antihypertensive medication side effects.        Positive major cardiovascular risk factors include female age 56 years old or older, diabetes, hyperlipidemia, hypertension, and family history for ischemic heart disease (males less than 52 years old).  Negative major cardiovascular risk factors include non-tobacco-user status.        Positive history for target organ damage include cardiac end organ damage (either CHF or LVH).  Further assessment for target organ damage reveals no history of ASHD, stroke/TIA, peripheral vascular disease, renal insufficiency, or hypertensive retinopathy.    Lipid Management History:      Positive NCEP/ATP III risk factors include female age 2 years old or older, diabetes, family history for ischemic heart disease (males less than 19 years old), and hypertension.  Negative NCEP/ATP III risk factors  include no history of early menopause without estrogen hormone replacement, non-tobacco-user status, no ASHD (atherosclerotic heart disease), no prior stroke/TIA, no peripheral vascular disease, and no history of aortic aneurysm.        The patient states that she knows about the "Therapeutic Lifestyle Change" diet.  Her compliance with the TLC diet is poor.  The patient expresses understanding of adjunctive measures for cholesterol lowering.  Adjunctive measures started by the patient include aerobic exercise.  She expresses no side effects from her lipid-lowering medication.  The patient denies any symptoms to suggest myopathy or liver disease from her "statin" therapy.          Prior Medications Reviewed Using: Patient Recall  Current Allergies: No known allergies   Past Medical History:    Reviewed history from 06/19/2007 and no changes required:       Dyslipidemia (12/07),        Rt. Ankle injury w/ comp. Incl. Osteo.,        TSH 0.052 8/06,        urine prot/creat. 0.03 (,0.15) 8/06  Past Surgical History:    Reviewed history from 10/23/2006 and no changes required:       Back surg. For ruptured disc -, clinic PFTs (7/99) WNL -, Echo (7/99) EF 60-70%, mod. LVH.  nl sys fuxn. -, Hysterectomy (8/87)  fibroids -, Neck mass - to surgery - 12/29/2001, nl cardiac cath - 05/27/2003, Rt. Ankle tib/talar fusion (7/97) -, Rt. Breast bx (10/98) fatty necrosis, no malig. -, Rt. Carpal tunnel release (`93) -, Tonsillectomy -    Risk Factors:  Passive smoke exposure:  no Drug use:  no HIV high-risk behavior:  no Alcohol use:  no Seatbelt use:  100 %  Family History Risk Factors:    Family History of MI in females < 65 years old:  no    Family History of MI in males < 16 years old:  yes  PAP Smear History:     Date of Last PAP Smear:  06/01/2008  PAP Smear History:     Date of Last PAP Smear:  06/01/2008    Results:  pt with hysterectomy  Colonoscopy History:     Date of Last Colonoscopy:  08/24/2007   Colonoscopy History:     Date of Last Colonoscopy:  08/24/2007    Results:  sessile polyps   PAP Smear History:     Date of Last PAP Smear:  10/24/1997     Physical Exam  General:     Well-developed,well-nourished,in no acute distress; alert,appropriate and cooperative throughout examination Neck:     No deformities, masses, or tenderness noted. Lungs:     Normal respiratory effort, chest expands symmetrically. Lungs are clear to auscultation, no crackles or wheezes. Heart:     Normal rate and regular rhythm. S1 and S2 normal without gallop, murmur, click, rub or other extra sounds. Abdomen:     Bowel sounds positive,abdomen soft and  non-tender without masses, organomegaly or hernias noted. Msk:     feet normal without any calluses or sores.  Pulses:     R and L carotid,radial,dorsalis pedis and posterior tibial pulses are full and equal bilaterally Extremities:     no edema. right shin with 5mm circular area of discoloration on medial aspect with palpable screw. no erythema, pain, swelling Skin:     Red scab like bumps on abdomen, breasts, chest, back. No drainage. Dry bumps. Stretch markes on abdomen from having children. Some hemangioma on back.  Psych:     Oriented X3, memory intact for recent and remote, normally interactive, and good eye contact.   Diabetes Management Exam:    Foot Exam (with socks and/or shoes not present):       Sensory-Pinprick/Light touch:          Left medial foot (L-4): normal          Left dorsal foot (L-5): normal          Left lateral foot (S-1): normal          Right medial foot (L-4): normal          Right dorsal foot (L-5): normal          Right lateral foot (S-1): normal       Sensory-Monofilament:          Left foot: normal          Right foot: normal       Inspection:          Left foot: normal          Right foot: normal       Nails:          Left foot: normal          Right foot: normal   Shoulder/Elbow Exam  Shoulder Exam:    Right:    Inspection:  Normal    Palpation:  Normal    Stability:  stable    Tenderness:  no    Swelling:  no    Erythema:  no    full range of motion    Left:    Inspection:  Normal    Palpation:  Normal       Location:  right bicipital groove    Stability:  stable    Tenderness:  right bicipital groove    Swelling:  no    Erythema:  no    full range of motion; however, tenderness with internal rotation and adduction    Impression & Recommendations:  Problem # 1:  BICEPS TENDINITIS, LEFT (ICD-726.12) Assessment: New patient declines steroid injection. is going to continue to try NSAIDs as well as ice application and  exercises.   Orders: [facility]- Est  Level 4 ([address])   Problem # 2:  HYPERLIPIDEMIA (ICD-272.4) Assessment: Comment Only Direct LDL 79 in June. Recheck in December. continue gemfibrozil.   Her updated medication list for this problem includes:    Gemfibrozil 600 Mg Tabs (Gemfibrozil) .Marland Kitchen... Take 1 tablet by mouth twice a day    Crestor 20 Mg Tabs (Rosuvastatin calcium) .Marland Kitchen... 1 by mouth at bedtime   Problem # 3:  HYPOTHYROIDISM, UNSPECIFIED (ICD-244.9) Assessment: Comment Only synthroid decreased at previous visit 2/2 low TSH. will check today and adjust synthroid as necessary.   Her updated medication list for this problem includes:    Synthroid 125 Mcg Tabs (Levothyroxine sodium) .Marland Kitchen... 1 by mouth daily  Orders: TSH-[facility] (60454-09811) [facility]- Est  Level 4 ([address]1478)   Problem # 4:  OBESITY, NOS (ICD-278.00) Assessment: Comment Only patient commended on current exercise regimen and gym attendance. patient states shes losing inches and not pounds. will continue to monitor.   Problem # 5:  DIABETES MELLITUS II, UNCOMPLICATED (ICD-250.00) Assessment: Deteriorated patient increased lantus on her own to 65 units at bedtime. most of CBGs still >200. will increase Lantus by 10 units and divide into two times a day dosing. urine microalbumin checked today. patient not due for eye exam. BP at goal.  foot exam normal.   The following medications were removed from the medication list:    Glipizide 10 Mg Tabs (Glipizide) .Marland Kitchen... Take 1/2 tablet daily    Glucophage 1000 Mg Tabs (Metformin hcl) .Marland Kitchen... Take 1 tablet twice daily  Her updated medication list for this problem includes:    Bayer Childrens Aspirin 81 Mg Chew (Aspirin) .Marland Kitchen... Take 1 tablet by mouth once a day    Lantus 100 Unit/ml Soln (Insulin glargine) .Marland Kitchen... 56 units inj Dudley at bedtime. dispense 3 vials.    Lisinopril-hydrochlorothiazide 20-12.5 Mg Tabs (Lisinopril-hydrochlorothiazide) .Marland Kitchen... 2  tablets by mouth qday  Orders: Basic Met-[facility]  ([id number]) UA Microalbumin-[facility] (95284) A1C-[facility] (13244) [facility]- Est  Level 4 (01027)   Problem # 6:  HYPERTENSION, BENIGN SYSTEMIC (ICD-401.1) Assessment: Comment Only at goal. continue current mgmt.   Her updated medication list for this problem includes:    Lisinopril-hydrochlorothiazide 20-12.5 Mg Tabs (Lisinopril-hydrochlorothiazide) .Marland Kitchen... 2  tablets by mouth qday    Coreg 12.5 Mg Tabs (Carvedilol) .Marland Kitchen... 1 by mouth bid  Orders: [facility]- Est  Level 4 (25366)   Problem # 7:  s/p right ankle surgery Assessment: Comment Only recommend patient see orthopedist.   Problem # 8:  Preventive Health Care (ICD-V70.0) given flu shot ? pneumovax.   Complete Medication List: 1)  Bayer Childrens Aspirin 81 Mg Chew (Aspirin) .... Take 1 tablet by mouth once a day 2)  Gemfibrozil 600 Mg Tabs (Gemfibrozil) .... Take 1 tablet by mouth twice a day 3)  Lantus 100 Unit/ml Soln (Insulin glargine) .... 56 units inj Bernice at bedtime. dispense 3 vials. 4)  Lisinopril-hydrochlorothiazide 20-12.5 Mg Tabs (Lisinopril-hydrochlorothiazide) .... 2  tablets by mouth qday 5)  Synthroid 125 Mcg Tabs (Levothyroxine sodium) .Marland Kitchen.. 1 by mouth daily 6)  Lancets Misc (Lancets) .... 3 lancets qday 7)  Coreg 12.5 Mg Tabs (Carvedilol) .Marland Kitchen.. 1 by mouth bid 8)  Crestor 20 Mg Tabs (Rosuvastatin calcium) .Marland Kitchen.. 1 by mouth at bedtime 9)  Triamcinolone Acetonide 0.1 % Crea (Triamcinolone acetonide) .... Aaa bid 10)  Permethrin 5 % Crea (Permethrin) .... Apply from head to toe and leave on for 8-14 hours then wash off. was all bedding and clothing.  reapply in 1 week as needed. 1 large tub 11)  Insulin Syringe Ult Thin Short 30g X 5/16" 1 Ml Misc (Insulin syringe-needle u-100) .... Use as directed.  Other Orders: Influenza Vaccine MCR (44034)  Diabetes Management Assessment/Plan:      The following lipid goals have been established for the patient: Total cholesterol goal of 200; LDL cholesterol goal of 100; HDL cholesterol goal of  40; Triglyceride goal of 150.  Her blood pressure goal is < 130/80.    Current Insulin Use:    Longer Acting Insulin:       Type:  Lantus       Dosage:  65 units at bedtime.  Hypertension Assessment/Plan:      The patient's hypertensive risk group is category C: Target organ damage and/or diabetes.  Today's blood pressure is 129/73.  Her blood pressure goal is < 130/80.  Lipid Assessment/Plan:      Based on NCEP/ATP III, the patient's risk factor category is "history of diabetes".  From this information, the patient's calculated lipid goals are as follows: Total cholesterol goal is 200; LDL cholesterol goal is 100; HDL cholesterol goal is 40; Triglyceride goal is 150.  Her LDL cholesterol goal has not been met.  Secondary causes for hyperlipidemia have been ruled out.  She has  been counseled on adjunctive measures for lowering her cholesterol and has been provided with dietary instructions.     Patient Instructions: 1)  Follow up with Dr. Lanier Prude in 4-6 weeks about Diabetes 2)  Change your Lantus to 35 units twice a day. Call our office with blood sugars 3 days.  3)  Take Aleve every 8 hours for 3-5 days until your shoulder is feeling a little better, then switch to as needed.  4)  Do the shoulder and bicep exercises we discussed.  5)  Keep up the good work with going to the gym!!! 6)  Put ice on your shoulder to help with soreness.  7)  Contact orthopedic surgeon about screw in your leg   ]  Vital Signs:  Patient Profile:   65 Years Old Female Height:     67.5 inches (171.45 cm) Weight:      271.0 pounds BMI:     41.97 Temp:     98.2 degrees F Pulse rate:   72 / minute BP sitting:   129 / 73    Location:   l [facility]    Intensity:   5              Flu Vaccine Result Date:  06/01/2008 Flu Vaccine Result:  given Flu Vaccine Next Due:  1 yr Last Colonoscopy:  abnormal (07/26/2006 3:54:40 PM) Colonoscopy Result Date:  08/24/2007 Colonoscopy Result:  sessile  polyps Colonoscopy Next Due:  3 yr Last PAP:  Done. (10/24/1997 12:00:00 AM) PAP Result Date:  06/01/2008 PAP Result:  pt with hysterectomy PAP Next Due:  Not Indicated Last HGBA1C:  6.9 (10/12/2007 10:01:40 AM) HGBA1C Result Date:  06/01/2008 HGBA1C Result:  9.9 HGBA1C Next Due:  3 mo Microalbumin Result Date:  06/01/2008 Microalbumin Result:  normal Microalbumin Next Due:  1 yr Foot Care Education Date:  06/01/2008 Foot Care Education:  completed Foot Care Education Due:  1 yr Self-Mgt EDU Date:  06/01/2008 Self-Mgt EDU:  completed Self-Mgt EDU Next Due:  1 yr    Influenza Vaccine    Vaccine Type: Fluvax MCR    Site: right deltoid    Mfr: GlaxoSmithKline    Dose: 0.5 ml    Route: IM    Given by: Starleen Blue RN    Exp. Date: 02/22/2009    Lot #: [id number]    VIS given: 03/19/07 version given June 01, 2008.  Flu Vaccine Consent Questions    Do you have a history of severe allergic reactions to this vaccine? no    Any prior history of allergic reactions to egg and/or gelatin? no    Do you have a sensitivity to the preservative Thimersol? no    Do you have a past history of Guillan-Barre Syndrome? no    Do you currently have an acute febrile illness? no    Have you ever had a severe reaction to latex? no    Vaccine information given and explained to patient? yes    Are you currently pregnant? no    Laboratory Results   Urine Tests  Date/Time Received: June 01, 2008 11:53 AM  Date/Time Reported: June 01, 2008 12:16 PM   Microalbumin (urine): normal mg/L   Comments: ...........test performed by...........Marland KitchenTerese Door, CMA   Blood Tests   Date/Time Received: June 01, 2008 11:53 AM  Date/Time Reported: June 01, 2008 12:15 PM   HGBA1C: 9.9%   (Normal Range: Non-Diabetic - 3-6%   Control Diabetic - 6-8%)  Comments: ...........test performed by...........Marland KitchenTerese Door, CMA

## 2010-09-25 NOTE — Letter (Signed)
Summary: Results Follow-up Letter  [facility]  [address]   [place], [place] [address]   Phone: [phone]  Fax: [phone]    08/21/2008  [address] [place], [place]  [address]  Dear Ms. Fischman,   All of your recent labwork was normal. Please continued to take your medications as prescribed, and follow up in January 2010 regarding your diabetes. If you have any questions, please call our office.  Sincerely,  Lequita Asal  MD [facility]           Appended Document: Results Follow-up Letter mailed

## 2010-09-25 NOTE — Miscellaneous (Signed)
  Clinical Lists Changes  Problems: Removed problem of UNSPECIFIED BREAST SCREENING (ICD-V76.10) Removed problem of PREVENTIVE HEALTH CARE (ICD-V70.0) Removed problem of SKIN RASH (ICD-782.1) Removed problem of SHOULDER PAIN, LEFT, CHRONIC (ICD-719.41)

## 2010-09-25 NOTE — Miscellaneous (Signed)
Summary: ROI  ROI   Imported By: Clydell Hakim 02/27/2010 16:04:47  _____________________________________________________________________  External Attachment:    Type:   Image     Comment:   External Document

## 2010-09-25 NOTE — Progress Notes (Signed)
Summary: 8/26 sda  Phone Note Call from Patient Call back at Home Phone [phone]   Caller: Patient Summary of Call: requesting sda for tomorrow for severe itchy rash that she has scratched until she is raw Initial call taken by: Dedra Skeens CMA,,  April 19, 2008 10:01 AM  Follow-up for Phone Call        states rash is all over. itchy very badly. family has scabies. they do not live with her. states she has been taking benadryl one per day. told her she can take it every 5 hrs if not driving or working. creme she was given last time for this does not help. unable to come today. appt tomorrow at 8:30am Follow-up by: Golden Circle RN,  April 19, 2008 10:05 AM

## 2010-09-25 NOTE — Assessment & Plan Note (Signed)
Summary: dm/meet new md/eo   Vital Signs:  Patient profile:   65 year old female Weight:      266 pounds Temp:     98.7 degrees F oral Pulse rate:   84 / minute BP sitting:   102 / 57  (left arm) Cuff size:   large  Vitals Entered By: Loralee Pacas CMA (March 01, 2010 10:11 AM)  Primary Care Provider:  Rodney Langton MD   History of Present Illness: 4F   DM2: On Lantus 30 qAm and 40 qPM.  Not taking oral hypoglycemics.  Control has been good in the past few months.  Pt amenable to doing once daily lantus. No lows.  usual AM CBGs have been 120's.  Checking 2-3x a day.  HTN:  Taking lisinopril hctz and metoprolol.  No ADEs  HLD:  recent dLDL high however FLPs have been well controlled in the past.  Currently fasting.  Hypothyroid:  Usually has TSH checked q yearly, has been stable the past couple years.  Due for mammo.  Current Medications (verified): 1)  Bayer Childrens Aspirin 81 Mg Chew (Aspirin) .... Take 1 Tablet By Mouth Once A Day 2)  Lantus 100 Unit/ml Soln (Insulin Glargine) .... 70 Units Injected Subcutaneously Qhs 3)  Lisinopril-Hydrochlorothiazide 20-12.5 Mg Tabs (Lisinopril-Hydrochlorothiazide) .... 2  Tablets By Mouth Qday 4)  Levothyroxine Sodium 112 Mcg  Tabs (Levothyroxine Sodium) .Marland Kitchen.. 1 By Mouth Daily 5)  Crestor 20 Mg  Tabs (Rosuvastatin Calcium) .Marland Kitchen.. 1 By Mouth At Bedtime 6)  Insulin Syringe Ult Thin Short 30g X 5/16" 1 Ml Misc (Insulin Syringe-Needle U-100) .... Use As Directed. 7)  Lancets   Misc (Lancets) .... 3 Lancets Qday 8)  Nexium 20 Mg Cpdr (Esomeprazole Magnesium) .... Take 1 Tablet Every Day. 9)  Norco 5-325 Mg Tabs (Hydrocodone-Acetaminophen) .... As Needed For Pain 10)  Metoprolol Tartrate 100 Mg Tabs (Metoprolol Tartrate) .... One Tab By Mouth Bid  Allergies (verified): No Known Drug Allergies  Past History:  Past Medical History: Last updated: 10/07/2008 Rt. Ankle injury w/ complications including osteomyelitis Hypothyroidism    Hyperlipidemia Hypertension DJD (Added 10/07/2008) Diabetes mellitus, type II (Added 10/07/2008)  Review of Systems       See HPI  Physical Exam  General:  Well-developed,well-nourished,in no acute distress; alert,appropriate and cooperative throughout examination Lungs:  Normal respiratory effort, chest expands symmetrically. Lungs are clear to auscultation, no crackles or wheezes. Heart:  Normal rate and regular rhythm. S1 and S2 normal without gallop, murmur, click, rub or other extra sounds. Abdomen:  Bowel sounds positive,abdomen soft and non-tender without masses, organomegaly or hernias noted. Skin:  Petechial type on blanching rash on LE, non-tender, present a long time.  Diabetes Management Exam:    Foot Exam (with socks and/or shoes not present):       Sensory-Monofilament:          Left foot: normal          Right foot: normal   Impression & Recommendations:  Problem # 1:  UNSPECIFIED BREAST SCREENING (ICD-V76.10) Assessment New Mammo ordered.  Orders: Mammogram (Screening) (Mammo)  Problem # 2:  SKIN RASH (ICD-782.1) Assessment: Improved Pt advised to stop ointment as not really helping.  Biopsy consistent with dermatitis.  Not bothering her.  The following medications were removed from the medication list:    Triamcinolone Acetonide 0.1 % Oint (Triamcinolone acetonide) .Marland Kitchen... Apply to affected areas twice a day. disp 60 g.  Problem # 3:  DIABETES MELLITUS, TYPE II,  CONTROLLED (ICD-250.00) Assessment: Improved Changed lantus to qHS dosing 70 units.  Med list updated.  Pt to check CBGs in the mornings and come back in 2 weeks to let me know how they've been doing.  A1c excellent.  The following medications were removed from the medication list:    Glyburide-metformin 2.5-500 Mg Tabs (Glyburide-metformin) ..... One tab by mouth bid Her updated medication list for this problem includes:    Bayer Childrens Aspirin 81 Mg Chew (Aspirin) .Marland Kitchen... Take 1 tablet by mouth  once a day    Lantus 100 Unit/ml Soln (Insulin glargine) .Marland KitchenMarland KitchenMarland KitchenMarland Kitchen 70 units injected subcutaneously qhs    Lisinopril-hydrochlorothiazide 20-12.5 Mg Tabs (Lisinopril-hydrochlorothiazide) .Marland Kitchen... 2  tablets by mouth qday  Orders: A1C-[facility] (29798) [facility]- Est  Level 4 (99214) Urinalysis-[facility] (00000)  Problem # 4:  HYPERLIPIDEMIA (ICD-272.4) Assessment: Unchanged Rechecking FLP before inceasing crestor.  Her updated medication list for this problem includes:    Crestor 20 Mg Tabs (Rosuvastatin calcium) .Marland Kitchen... 1 by mouth at bedtime  Orders: A1C-[facility] (92119) [facility]- Est  Level 4 (99214) Lipid-[facility] (41740-81448)  Problem # 5:  HYPOTHYROIDISM (ICD-244.9) Assessment: Unchanged Not due for TSH.  No refills needed.  Asymptomatic.  Her updated medication list for this problem includes:    Levothyroxine Sodium 112 Mcg Tabs (Levothyroxine sodium) .Marland Kitchen... 1 by mouth daily  Problem # 6:  HYPERTENSION, BENIGN SYSTEMIC (ICD-401.1) Assessment: Unchanged Well controlled, no changes.  She is unclear whether she is really taking metoprolol, we haven't rx'ed it.  She will check her meds at home and call back to let me know.  Added to med list for now.  Her updated medication list for this problem includes:    Lisinopril-hydrochlorothiazide 20-12.5 Mg Tabs (Lisinopril-hydrochlorothiazide) .Marland Kitchen... 2  tablets by mouth qday    Metoprolol Tartrate 100 Mg Tabs (Metoprolol tartrate) ..... One tab by mouth bid  Orders: [facility]- Est  Level 4 (18563)  Complete Medication List: 1)  Bayer Childrens Aspirin 81 Mg Chew (Aspirin) .... Take 1 tablet by mouth once a day 2)  Lantus 100 Unit/ml Soln (Insulin glargine) .... 70 units injected subcutaneously qhs 3)  Lisinopril-hydrochlorothiazide 20-12.5 Mg Tabs (Lisinopril-hydrochlorothiazide) .... 2  tablets by mouth qday 4)  Levothyroxine Sodium 112 Mcg Tabs (Levothyroxine sodium) .Marland Kitchen.. 1 by mouth daily 5)  Crestor 20 Mg Tabs (Rosuvastatin calcium) .Marland Kitchen.. 1 by mouth at bedtime 6)  Insulin  Syringe Ult Thin Short 30g X 5/16" 1 Ml Misc (Insulin syringe-needle u-100) .... Use as directed. 7)  Lancets Misc (Lancets) .... 3 lancets qday 8)  Nexium 20 Mg Cpdr (Esomeprazole magnesium) .... Take 1 tablet every day. 9)  Norco 5-325 Mg Tabs (Hydrocodone-acetaminophen) .... As needed for pain 10)  Metoprolol Tartrate 100 Mg Tabs (Metoprolol tartrate) .... One tab by mouth bid  Patient Instructions: 1)  Great to see you, 2)  Change Lantus to 70 units at bedtime only.  CHeck your morning sugars. 3)  Go to the lab. 4)  Come back to see me in 2 WEEKS. 5)  -Dr. Karie Schwalbe.  Last Diabetes Foot Check:  yes (06/01/2008 10:52:22 AM) Diabetic Foot Exam Date:  03/01/2010 Diabetes Foot Check Result:  normal Diabetes Foot Check Next Due:  1 yr Last HGBA1C:  7.1 (11/01/2009 3:43:17 PM) HGBA1C Next Due:  6 mo Microalbumin Result Date:  03/01/2010 Microalbumin Result:  normal Microalbumin Next Due:  1 yr Last Foot Care Education:  Done (11/01/2009 1:48:04 PM) Foot Care Education Due:  1 yr Last Self-Mgt EDU:  completed (06/01/2008 10:52:22  AM) Self-Mgt EDU Date:  03/01/2010 Self-Mgt EDU:  completed Self-Mgt EDU Next Due:  1 yr Last Creatinine:  0.94 (08/11/2009 8:08:00 PM) Creatinine Next Due: 1 yr Last Potassium:  3.7 (08/11/2009 8:08:00 PM) Potassium Next Due:  1 yr   Laboratory Results   Urine Tests  Date/Time Received: March 01, 2010 11:13 AM  Date/Time Reported: March 01, 2010 12:02 PM   Routine Urinalysis   Color: yellow Appearance: Clear Glucose: negative   (Normal Range: Negative) Bilirubin: negative   (Normal Range: Negative) Ketone: negative   (Normal Range: Negative) Spec. Gravity: 1.025   (Normal Range: 1.003-1.035) Blood: negative   (Normal Range: Negative) pH: 5.5   (Normal Range: 5.0-8.0) Protein: negative   (Normal Range: Negative) Urobilinogen: 0.2   (Normal Range: 0-1) Nitrite: negative   (Normal Range: Negative) Leukocyte Esterace: trace   (Normal Range:  Negative)  Urine Microscopic WBC/HPF: 5-10 Bacteria/HPF: 3+ Epithelial/HPF: 10-20 Casts/LPF: occ hyaline  Microalbumin (urine): normal mg/L   Comments: ...............test performed by......Marland KitchenBonnie A. [place], MLS (ASCP)cm   Blood Tests   Date/Time Received: March 01, 2010 10:14 AM Date/Time Reported: March 01, 2010 10:27 AM  HGBA1C: 7.1%   (Normal Range: Non-Diabetic - 3-6%   Control Diabetic - 6-8%)  Comments: test performed by Loralee Pacas CMA       Diabetic Foot Exam Foot Inspection Is there a history of a foot ulcer?              No Is there a foot ulcer now?              No Is there swelling or an abnormal foot shape?          No Are the toenails long?                No Are the toenails thick?                No Are the toenails ingrown?              No Is there heavy callous build-up?              No Is there pain in the calf muscle (Intermittent claudication) when walking?    NoIs there a claw toe deformity?              No Is there elevated skin temperature?            No Is there limited ankle dorsiflexion?            No Is there foot or ankle muscle weakness?            No  Diabetic Foot Care Education Pulse Check          Right Foot          Left Foot Posterior Tibial:        normal            normal Dorsalis Pedis:        normal            normal  Set Next Diabetic Foot Exam here: 03/02/2011   10-g (5.07) Semmes-Weinstein Monofilament Test Performed by: Rodney Langton MD          Right Foot          Left Foot Visual Inspection     normal         normal Test Control      normal  normal Site 1         normal         normal Site 2         normal         normal Site 3         normal         normal Site 4         normal         normal Site 5         normal         normal Site 6         normal         normal Site 7         normal         normal Site 8         normal         normal Site 9         normal         normal Site 10          normal         normal  Impression      normal         normal                Nursing Instructions: Diabetic foot exam today

## 2010-09-25 NOTE — Progress Notes (Signed)
  Phone Note Outgoing Call   Call placed by: Lequita Asal  MD,  November 07, 2009 3:50 PM Summary of Call: called and discussed recent labs results (A1C and lipids).  Initial call taken by: Lequita Asal  MD,  November 07, 2009 3:52 PM

## 2010-09-25 NOTE — Assessment & Plan Note (Signed)
Summary: itching all over wp   Vital Signs:  Patient Profile:   65 Years Old Female Height:     67.5 inches (171.45 cm) Weight:      272.06 pounds BMI:     42.13 Temp:     98.6 degrees F Pulse rate:   61 / minute BP sitting:   113 / 61  (right arm)  Pt. in pain?   no  Vitals Entered By: Dennison Nancy RN (March 22, 2008 8:58 AM)              Is Patient Diabetic? Yes      PCP:  Adrian Blackwater  MD   History of Present Illness: itching x 3 weeks.  doesn't keep gher up at night.  grandson had itchy rash - patches of bumps but that went away.  no new meds, soaps.  hers is red spots tahtmoves.  no fevers.  no tick exposure.  has not tried any meds.  better with scratching.  made worse by scratching.  usually uses Paramedic.  no change in food.        Physical Exam  General:     Well-developed,well-nourished,in no acute distress; alert,appropriate and cooperative throughout examination Skin:     few scattered red bumsp - one lin line of three on arm. no welts, blisters.    Impression & Recommendations:  Problem # 1:  PRURITUS (ICD-698.9) Assessment: New Gold bond itch or Itch-X - instead of itching, put the gel on.  Its a topical anesthetic - only lasts for an hour or two.  Also use Triamcinilone 0.1% cream twice daily on areas.  Then use Clariten or Zyrtec over the counter. Orders: [facility]- Est Level  3 (04540)   Complete Medication List: 1)  Bayer Childrens Aspirin 81 Mg Chew (Aspirin) .... Take 1 tablet by mouth once a day 2)  Gemfibrozil 600 Mg Tabs (Gemfibrozil) .... Take 1 tablet by mouth twice a day 3)  Glipizide 10 Mg Tabs (Glipizide) .... Take 1/2 tablet daily 4)  Lantus 100 Unit/ml Soln (Insulin glargine) .... 56 units inj [place] at bedtime. dispense 3 vials. 5)  Lisinopril-hydrochlorothiazide 20-12.5 Mg Tabs (Lisinopril-hydrochlorothiazide) .... 2  tablets by mouth qday 6)  Synthroid 125 Mcg Tabs (Levothyroxine sodium) .Marland Kitchen.. 1 by mouth daily 7)   Glucophage 1000 Mg Tabs (Metformin hcl) .... Take 1 tablet twice daily 8)  Lancets Misc (Lancets) .... 3 lancets qday 9)  Coreg 12.5 Mg Tabs (Carvedilol) .Marland Kitchen.. 1 by mouth bid 10)  Crestor 20 Mg Tabs (Rosuvastatin calcium) .Marland Kitchen.. 1 by mouth at bedtime 11)  Triamcinolone Acetonide 0.1 % Crea (Triamcinolone acetonide) .... Aaa bid   Patient Instructions: 1)  Gold bond itch or Itch-X - instead of itching, put the gel on.  Its a topical anesthetic - only lasts for an hour or two.  Also use Triamcinilone 0.1% cream twice daily on areas.  Then use Clariten or Zyrtec over the counter.   Prescriptions: TRIAMCINOLONE ACETONIDE 0.1 %  CREA (TRIAMCINOLONE ACETONIDE) aaa bid  #1 x 0   Entered and Authorized by:   Rolm Gala MD   Signed by:   Rolm Gala MD on 03/22/2008   Method used:   Electronically sent to ...       [facility]  [address] # [id number]*       [address]       [place], [place]  [address]  Ph: [phone] or [phone]       Fax: [phone]   RxID:   [email or web address]  ]  Vital Signs:  Patient Profile:   65 Years Old Female Height:     67.5 inches (171.45 cm) Weight:      272.06 pounds BMI:     42.13 Temp:     98.6 degrees F Pulse rate:   61 / minute BP sitting:   113 / 61

## 2010-09-25 NOTE — Assessment & Plan Note (Signed)
Summary: rash on back of legs,tcb   Vital Signs:  Patient profile:   65 year old female Weight:      270.4 pounds Temp:     97.9 degrees F oral Pulse rate:   76 / minute Pulse rhythm:   regular BP sitting:   145 / 81  (left arm) Cuff size:   large  Vitals Entered By: Loralee Pacas CMA (December 13, 2009 3:36 PM)  Primary Care Provider:  Lequita Asal  MD  CC:  rash on legs.  History of Present Illness: worsening rash  "spots on leg." - several months in duration (>9). more areas are appearing. quarter sized erythematous areas on bilateral calves. nontender, nonpruritic. denies any trauma, insect exposure, bruising elsewhere. patient agreeable to bx  Current Medications (verified): 1)  Bayer Childrens Aspirin 81 Mg Chew (Aspirin) .... Take 1 Tablet By Mouth Once A Day 2)  Lantus 100 Unit/ml Soln (Insulin Glargine) .... 30 Units Inj [place] Two Times A Day. Dispense 4 Vials 3)  Lisinopril-Hydrochlorothiazide 20-12.5 Mg Tabs (Lisinopril-Hydrochlorothiazide) .... 2  Tablets By Mouth Qday 4)  Levothyroxine Sodium 112 Mcg  Tabs (Levothyroxine Sodium) .Marland Kitchen.. 1 By Mouth Daily 5)  Crestor 20 Mg  Tabs (Rosuvastatin Calcium) .Marland Kitchen.. 1 By Mouth At Bedtime 6)  Mobic 15 Mg Tabs (Meloxicam) .Marland Kitchen.. 1 Tablet By Mouth Daily For 2 Weeks, Then As Needed For Pain. 7)  Insulin Syringe Ult Thin Short 30g X 5/16" 1 Ml Misc (Insulin Syringe-Needle U-100) .... Use As Directed. 8)  Lancets   Misc (Lancets) .... 3 Lancets Qday 9)  Glyburide-Metformin 2.5-500 Mg Tabs (Glyburide-Metformin) .... One Tab By Mouth Bid  Allergies (verified): No Known Drug Allergies  Physical Exam  General:  NAD, obese, alert. vitals reviewed.  Skin:  several areas of erythema with ?central clearing and surrounding petechiae on bilateral calves. blanchable.  some with petechiae with lacy erythematous appearance. denies tenderness with palpation, no induration, warmth.   Procedure Note Last Tetanus: Done. (10/01/2004)  Biopsy: Biopsy  Type: Skin The patient complains of changing lesion but denies pain, redness, irritation, inflammation, tenderness, swelling, and fever. Date of onset: 02/23/2009 Onset of lesion: > 3 months Indication: changing lesion Consent signed: yes  Procedure # 1: punch biopsy    Size (in cm): 0.5 x 0.5    Region: posterior    Location: leg-lower-left    Comment: area cleaned prepped in sterile fashion using betadine. anesthetized using 5 ml 1% lidocaine with epinephrine. specimen obtained using 5 mm punch and placed in labeled container. hemostasis obtained using silver nitrate. wound dressed with bacitracin and gauze. minimal bleeding. patient tolerated well.    Instrument used: 5mm punch    Anesthesia: 1% lidocaine w/epinephrine   Impression & Recommendations:  Problem # 1:  SKIN RASH (ICD-782.1) Assessment Deteriorated  await bx results.   Orders: [facility]- Est  Level 4 (60454) Punch Biopsy- [facility] (11100)

## 2010-09-25 NOTE — Assessment & Plan Note (Signed)
Summary: blood sugar/cholesterol/wp   Vital Signs:  Patient Profile:   65 Years Old Female Height:     67.5 inches (171.45 cm) Weight:      275.1 pounds BMI:     42.60 Temp:     98.0 degrees F oral Pulse rate:   59 / minute BP sitting:   124 / 63  (left arm) Cuff size:   large  Pt. in pain?   no  Vitals Entered By: Garen Grams LPN (February 17, 2008 2:49 PM)                  PCP:  Adrian Blackwater  MD  Chief Complaint:  f/u visit.  History of Present Illness: 65 yo F with:  1. DM - Had a "bad spell" where she needed to increase lantus.  - 300-400s.  now lantus up to 68 units.  no cough, fever, foot wounds, chset pain, dizziness.  on ace-i, asa.  this am 141, lunch 153.  trying to not eat p 6pm.  joined gym 3 weeks going 4d/week.  going with family.  used Psychologist, educational.  already able to do more at gym. great bp.    2. Hyperlipidemia - needs CMET and LDL.  goal is <70.  3. Hypothyroidism - stable on synthroid.  due for recheck.        Risk Factors:  Colonoscopy History:     Date of Last Colonoscopy:  07/26/2006   Colonoscopy History:     Date of Last Colonoscopy:  07/26/2006    Results:  abnormal     Physical Exam  General:     Well-developed,well-nourished,in no acute distress; alert,appropriate and cooperative throughout examination Lungs:     Normal respiratory effort, chest expands symmetrically. Lungs are clear to auscultation, no crackles or wheezes. Heart:     Normal rate and regular rhythm. S1 and S2 normal without gallop, murmur, click, rub or other extra sounds.    Impression & Recommendations:  Problem # 1:  DIABETES MELLITUS II, UNCOMPLICATED (ICD-250.00) Assessment: Unchanged Diabetes- Try stopping the  Glipizide - if your blood sugars jump a lot, then you can put it back on.  However, after this many years and this much insulin, I think your body is just relying on the insulin now.  Otherwise, if your blood sugars go up, you could also increase  your insulin by 1 unit daily until your fasting blood sugar is 110 or less.    Her updated medication list for this problem includes:    Bayer Childrens Aspirin 81 Mg Chew (Aspirin) .Marland Kitchen... Take 1 tablet by mouth once a day    Glipizide 10 Mg Tabs (Glipizide) .Marland Kitchen... Take 1/2 tablet daily    Lantus 100 Unit/ml Soln (Insulin glargine) .Marland Kitchen... 56 units inj [place] qhs    Lisinopril-hydrochlorothiazide 20-12.5 Mg Tabs (Lisinopril-hydrochlorothiazide) .Marland Kitchen... 2  tablets by mouth qday    Glucophage 1000 Mg Tabs (Metformin hcl) .Marland Kitchen... Take 1 tablet twice daily  Orders: A1C-[facility] (78295) [facility]- Est  Level 4 (62130)   Problem # 2:  HYPERLIPIDEMIA (ICD-272.4) Assessment: Unchanged Will recheck your cholestorl and liver enzymes today.  Her updated medication list for this problem includes:    Gemfibrozil 600 Mg Tabs (Gemfibrozil) .Marland Kitchen... Take 1 tablet by mouth twice a day    Crestor 20 Mg Tabs (Rosuvastatin calcium) .Marland Kitchen... 1 by mouth at bedtime  Orders: Comp Met-[facility] (86578-46962) Direct LDL-[facility] (95284-13244) [facility]- Est  Level 4 (01027)   Problem # 3:  HYPOTHYROIDISM, UNSPECIFIED (ICD-244.9)  Assessment: Unchanged will recheck today. Her updated medication list for this problem includes:    Synthroid 137 Mcg Tabs (Levothyroxine sodium) ..... One tablet by mouth qday  Orders: TSH-[facility] (04540-98119) [facility]- Est  Level 4 (14782)   Complete Medication List: 1)  Bayer Childrens Aspirin 81 Mg Chew (Aspirin) .... Take 1 tablet by mouth once a day 2)  Gemfibrozil 600 Mg Tabs (Gemfibrozil) .... Take 1 tablet by mouth twice a day 3)  Glipizide 10 Mg Tabs (Glipizide) .... Take 1/2 tablet daily 4)  Lantus 100 Unit/ml Soln (Insulin glargine) .... 56 units inj  qhs 5)  Lisinopril-hydrochlorothiazide 20-12.5 Mg Tabs (Lisinopril-hydrochlorothiazide) .... 2  tablets by mouth qday 6)  Synthroid 137 Mcg Tabs (Levothyroxine sodium) .... One tablet by mouth qday 7)  Glucophage 1000 Mg Tabs (Metformin hcl) .... Take 1 tablet  twice daily 8)  Lancets Misc (Lancets) .... 3 lancets qday 9)  Coreg 12.5 Mg Tabs (Carvedilol) .Marland Kitchen.. 1 by mouth bid 10)  Crestor 20 Mg Tabs (Rosuvastatin calcium) .Marland Kitchen.. 1 by mouth at bedtime   Patient Instructions: 1)  Diabetes- Try stopping the  Glipizide - if your blood sugars jump a lot, then you can put it back on.  However, after this many years and this much insulin, I think your body is just relying on the insulin now.  Otherwise, if your blood sugars go up, you could also increase your insulin by 1 unit daily until your fasting blood sugar is 110 or less.   2)  Will recheck your cholestorl and liver enzymes today.   3)  Come back to see [place] in 3 months.   ] Flex Sig Next Due:  Not Indicated Last Colonoscopy:  Done. (07/26/2006 12:00:00 AM) Colonoscopy Result Date:  07/26/2006 Colonoscopy Result:  abnormal Colonoscopy Next Due:  3 yr Last Hemoccult Result: Done. (07/26/2006 12:00:00 AM) Hemoccult Next Due:  Not Indicated Last PAP:  Done. (10/24/1997 12:00:00 AM) PAP Result Date:  02/17/2008 PAP Next Due:  Not Indicated  Laboratory Results   Blood Tests   Date/Time Received: February 17, 2008 3:22  PM  Date/Time Reported: February 17, 2008 3:38 PM    Comments: ...............test performed by......Marland KitchenBonnie A. [place], [place] (ASCP)

## 2010-09-25 NOTE — Assessment & Plan Note (Signed)
Summary: f/u DM, HTN   Vital Signs:  Patient Profile:   65 Years Old Female Height:     67.5 inches (171.45 cm) Weight:      266.5 pounds BMI:     41.27 Temp:     98.1 degrees F oral Pulse rate:   72 / minute BP sitting:   158 / 84  (left arm) Cuff size:   large  Pt. in pain?   yes    Location:   left knee, shoulder    Intensity:   8  Vitals Entered By: Dedra Skeens CMA, (October 07, 2008 8:41 AM)                  PCP:  Lequita Asal  MD  Chief Complaint:  f/u DM and HTN.  History of Present Illness: Patient 65 y/o female with multiple medical problems including DM, HTN, hypothyroidism, who presents for routine follow up. Of note, patient's husband passed away two weeks ago, and she is extremely stressed and grieving.   DM- CBGs have been elevated. Pt had decreased to 40 units two times a day of lantus, but given CBGs ranging from 100s to 400s, she has increased back to 60 units two times a day. denies polyuria, polydipsia. Unable to exercise as much 2/2 shoulder/knee pain. reports adherence to medication regimen.   HTN- BP elevated. taking all meds. denies CP, palpitations, SOB, increased edema, blurred vision, headache.   Arthritis- Pt with continued shoulder and knee pain. Limited ROM. unable to exercise as much 2/2 pain. reports relief with meloxicam.     Current Allergies: No known allergies   Past Medical History:    Rt. Ankle injury w/ complications including osteomyelitis    Hypothyroidism     Hyperlipidemia    Hypertension    DJD (Added 10/07/2008)    Diabetes mellitus, type II (Added 10/07/2008)   Social History:    [place].  Widowed February 2010. Has 6 children, 14 grandchildren. Works in the school system. No tobacco, no etoh.  Grandson living with pt. -updated 10/07/08 due to husband recently dying    Physical Exam  General:     NAD, obese, alert. Eyes:     exotropia of L eye, unchanged from previous Neck:     No deformities,  masses, or tenderness noted. no JVD Lungs:     Normal respiratory effort, chest expands symmetrically. Lungs are clear to auscultation, no crackles or wheezes. Heart:     Normal rate and regular rhythm. S1 and S2 normal without gallop, murmur, click, rub or other extra sounds. Pulses:     2+ distal pulses Extremities:     no edema    Impression & Recommendations:  Problem # 1:  DIABETES MELLITUS, TYPE II (ICD-250.00) Assessment: Deteriorated patient currently grieving and under stress. A1C continues to worsen. will continue lantus 60 units two times a day. patient to return for FLP. BP also not at goal. no changes.   Her updated medication list for this problem includes:    Bayer Childrens Aspirin 81 Mg Chew (Aspirin) .Marland Kitchen... Take 1 tablet by mouth once a day    Lantus 100 Unit/ml Soln (Insulin glargine) .Marland KitchenMarland KitchenMarland KitchenMarland Kitchen 60 units inj [place] two times a day. dispense 4 vials    Lisinopril-hydrochlorothiazide 20-12.5 Mg Tabs (Lisinopril-hydrochlorothiazide) .Marland Kitchen... 2  tablets by mouth qday  Orders: [facility]- Est  Level 4 (04540)   Problem # 2:  HYPERTENSION (ICD-401.9) Assessment: Deteriorated likely 2/2 to grief/stress. no changes at this time.  Her updated medication list for this problem includes:    Lisinopril-hydrochlorothiazide 20-12.5 Mg Tabs (Lisinopril-hydrochlorothiazide) .Marland Kitchen... 2  tablets by mouth qday    Carvedilol 6.25 Mg Tabs (Carvedilol) .Marland Kitchen... 1 tab by mouth two times daily   Problem # 3:  ARTHRITIS, DEGENERATIVE (ICD-715.09) Assessment: Deteriorated controlled when patient takes meloxicam. not interested in steroid injection. given exercises for knees/shoulder. may need PT.   Her updated medication list for this problem includes:    Bayer Childrens Aspirin 81 Mg Chew (Aspirin) .Marland Kitchen... Take 1 tablet by mouth once a day    Mobic 15 Mg Tabs (Meloxicam) .Marland Kitchen... 1 tablet by mouth daily for 2 weeks, then as needed for pain.  Orders: [facility]- Est  Level 4 (04540)   Problem # 4:  OBESITY, NOS  (ICD-278.00) patient encouraged to resume exercise as a means of stress relief and to help control her chronic medical problems.  Orders: [facility]- Est  Level 4 (98119)   Complete Medication List: 1)  Bayer Childrens Aspirin 81 Mg Chew (Aspirin) .... Take 1 tablet by mouth once a day 2)  Gemfibrozil 600 Mg Tabs (Gemfibrozil) .... Take 1 tablet by mouth twice a day 3)  Lantus 100 Unit/ml Soln (Insulin glargine) .... 60 units inj [place] two times a day. dispense 4 vials 4)  Lisinopril-hydrochlorothiazide 20-12.5 Mg Tabs (Lisinopril-hydrochlorothiazide) .... 2  tablets by mouth qday 5)  Levothyroxine Sodium 112 Mcg Tabs (Levothyroxine sodium) .Marland Kitchen.. 1 by mouth daily 6)  Lancets Misc (Lancets) .... 3 lancets qday 7)  Carvedilol 6.25 Mg Tabs (Carvedilol) .Marland Kitchen.. 1 tab by mouth two times daily 8)  Crestor 20 Mg Tabs (Rosuvastatin calcium) .Marland Kitchen.. 1 by mouth at bedtime 9)  Triamcinolone Acetonide 0.1 % Crea (Triamcinolone acetonide) .... Aaa bid 10)  Permethrin 5 % Crea (Permethrin) .... Apply from head to toe and leave on for 8-14 hours then wash off. was all bedding and clothing.  reapply in 1 week as needed. 1 large tub 11)  Insulin Syringe Ult Thin Short 30g X 5/16" 1 Ml Misc (Insulin syringe-needle u-100) .... Use as directed. 12)  Mobic 15 Mg Tabs (Meloxicam) .Marland Kitchen.. 1 tablet by mouth daily for 2 weeks, then as needed for pain.  Other Orders: A1C-[facility] (14782)  Future Orders: Lipid-[facility] (95621-30865) ... 10/11/2009   Patient Instructions: 1)  Follow up in 3 months with Dr. Lanier Prude for Diabetes 2)  Make a lab appointment for next week to check your cholesterol 3)  Do the exercises on the handout for your shoulder and knee and take the pain medication as you need to. You can also try heat and ice.  4)  It is important that you exercise regularly at least 20 minutes 5 times a week. If you develop chest pain, have severe difficulty breathing, or feel very tired , stop exercising immediately and seek medical  attention.   Prescriptions: MOBIC 15 MG TABS (MELOXICAM) 1 tablet by mouth daily for 2 weeks, then as needed for pain.  #30 x 5   Entered and Authorized by:   Lequita Asal  MD   Signed by:   Lequita Asal  MD on 10/07/2008   Method used:   Electronically to        [facility]  [address] # [id number]* (retail)       [address]       [place], [place]  [address]       Ph: [phone] or [phone]  Fax: [phone]   RxID:   [id number]   Laboratory Results   Blood Tests   Date/Time Received: October 07, 2008 9:29 AM  Date/Time Reported: October 07, 2008 9:39 AM   HGBA1C: 10.1%   (Normal Range: Non-Diabetic - 3-6%   Control Diabetic - 6-8%)  Comments: ...........test performed by...........Marland KitchenTerese Door, CMA

## 2010-09-25 NOTE — Progress Notes (Signed)
Summary: triage  Phone Note Call from Patient Call back at Home Phone [phone]   Caller: Patient Summary of Call: went to ED in [facility] and was told that she has gallstones and wants to come in to be seen today - starting to run a fever Initial call taken by: De Nurse,  February 22, 2010 8:37 AM  Follow-up for Phone Call        seen in ed in [place] Wed am.  went for for c/o fever & stomach pain & burping. felt "terrible"  diagnosed with gallstones.  they gave her narcotic pain pills & antibiotic.  no pain now. has not taken any pain meds yet. took motrin in ED which helped a lot. wants to be seen now. no appt but work ins later today with wait. she declned that. appt at 8:30am tomorrow. told her if pain returns, take inu or the pain meds they gave her. continue the antibiotics. reviewed diet & advised low fat diet. gave examples Follow-up by: Golden Circle RN,  February 22, 2010 9:19 AM  Additional Follow-up for Phone Call Additional follow up Details #1::        Noted, to be seen tomo. Additional Follow-up by: Rodney Langton MD,  February 22, 2010 1:02 PM

## 2010-09-27 NOTE — Progress Notes (Signed)
Summary: EMERGENCY LINE CALL  Phone Note Call from Patient   Caller: Patient Summary of Call: Patient states she was not feeling well this evening and took her blood pressure on machine at home.  Read 200/107.  Repeat was the same.  Has been taking her blood pressure medications regularly.  No pain, no immediate events that would have caused pressure to rise.  Denies any headaches, chest pain, shortness of breath, abdominal pain.  States she just felt tired when pressed on symptoms.  Recommended she take another blood by mouth blood pressure medicine as her last was early this AM.  Has appt already scheduled tomorrow at [facility].  Recommended to keep that appt.  Given red flags and reasons to call back or go to ED tonight.  Patient agreed with plan and will keep appt tomorrow.   Initial call taken by: Renold Don MD,  September 05, 2010 1:17 AM

## 2010-09-27 NOTE — Letter (Signed)
Summary: Results Follow-up Letter  [facility]  [address]   [place], [place] [address]   Phone: [phone]  Fax: [phone]    09/06/2010  [address] [place], [place]  [address]  Dear Ms. Rumble,   All of your bloodwork was normal.  Your kidney function was excellent, your thyroid levels were perfect.  No changes in your medications need to be done.  Sincerely,  Rodney Langton MD [facility]           Appended Document: Results Follow-up Letter mailed

## 2010-09-27 NOTE — Assessment & Plan Note (Signed)
Summary: f/u,df   Vital Signs:  Patient profile:   65 year old female Weight:      260 pounds Temp:     99 degrees F oral Pulse rate:   85 / minute Pulse rhythm:   regular BP sitting:   139 / 74  (left arm) Cuff size:   large  Vitals Entered By: Loralee Pacas CMA (September 05, 2010 2:04 PM) CC: follow-up visit Is Patient Diabetic? No Pain Assessment Patient in pain? no        Primary Care Provider:  Rodney Langton MD  CC:  follow-up visit.  History of Present Illness: 65 yo female with HTN, HLD, Hypothyroid, DM2 here for fu.  HTN:  Called emergency line overnight with c/o BP elevated to 219/108, pulse 67 on home Bp monitor.  No HA, visual changes, CP, SOB, LE swelling, abd pain.  Just scared.  Advised to RTC today.  She had taken all her BP medications.  No coffee, no other stimulants used at that time.  DM2:  Due for Hb A1c, has been controlled well in the past.  Needs Lantus refill.  Hypothyroid:  last TSH WNL.  Due for recheck.  She needs refills on just about everything.  Habits & Providers  Alcohol-Tobacco-Diet     Alcohol drinks/day: 0     Tobacco Status: never     Tobacco Counseling: not indicated; no tobacco use     Passive Smoke Exposure: no  Exercise-Depression-Behavior     Have you felt down or hopeless? no     Have you felt little pleasure in things? no     Depression Counseling: not indicated; screening negative for depression     Seat Belt Use: always  Current Medications (verified): 1)  Bayer Childrens Aspirin 81 Mg Chew (Aspirin) .... Take 1 Tablet By Mouth Once A Day 2)  Lantus 100 Unit/ml Soln (Insulin Glargine) .... 60 Units Injected Subcutaneously Qhs 3)  Lisinopril-Hydrochlorothiazide 20-25 Mg Tabs (Lisinopril-Hydrochlorothiazide) .... One Tab By Mouth Daily 4)  Levothyroxine Sodium 112 Mcg  Tabs (Levothyroxine Sodium) .Marland Kitchen.. 1 By Mouth Daily 5)  Crestor 20 Mg  Tabs (Rosuvastatin Calcium) .Marland Kitchen.. 1 By Mouth At Bedtime 6)  Insulin Syringe  Ult Thin Short 30g X 5/16" 1 Ml Misc (Insulin Syringe-Needle U-100) .... Use As Directed. 7)  Lancets   Misc (Lancets) .... 3 Lancets Qday 8)  Nexium 20 Mg Cpdr (Esomeprazole Magnesium) .... Take 1 Tablet Every Day. 9)  Norco 5-325 Mg Tabs (Hydrocodone-Acetaminophen) .... As Needed For Pain  Allergies (verified): No Known Drug Allergies  Social History: Risk analyst Use:  always  Review of Systems       See HPI  Physical Exam  General:  Well-developed,well-nourished,in no acute distress; alert,appropriate and cooperative throughout examination Lungs:  Normal respiratory effort, chest expands symmetrically. Lungs are clear to auscultation, no crackles or wheezes. Heart:  Normal rate and regular rhythm. S1 and S2 normal without gallop, murmur, click, rub or other extra sounds. Extremities:  No edema.   Impression & Recommendations:  Problem # 1:  DIABETES MELLITUS, TYPE II, CONTROLLED (ICD-250.00) Assessment Deteriorated Pt has been well controlled on lantus 60 units in the past. Will have her reassess her diet and then recheck. Eye exam as in prevention section. No other changes.  Her updated medication list for this problem includes:    Bayer Childrens Aspirin 81 Mg Chew (Aspirin) .Marland Kitchen... Take 1 tablet by mouth once a day    Lantus 100 Unit/ml Soln (Insulin glargine) .Marland KitchenMarland KitchenMarland KitchenMarland Kitchen  60 units injected subcutaneously qhs    Lisinopril-hydrochlorothiazide 20-25 Mg Tabs (Lisinopril-hydrochlorothiazide) ..... One tab by mouth daily  Orders: A1C-[facility] (04540) [facility]- Est  Level 4 (98119) Basic Met-[facility] ([address]21) CBC-[facility] (30865)  Problem # 2:  HYPOTHYROIDISM (ICD-244.9) Assessment: Unchanged Rechecking TSH today.  Her updated medication list for this problem includes:    Levothyroxine Sodium 112 Mcg Tabs (Levothyroxine sodium) .Marland Kitchen... 1 by mouth daily  Orders: [facility]- Est  Level 4 (99214) TSH-[facility] ([address]8469-62952)  Problem # 3:  HYPERTENSION, BENIGN SYSTEMIC (ICD-401.1) Assessment:  Deteriorated Single, asymptomatic elevated BP reading, no changes needed.   BP ok today but not <130/80, again will offer dietary modifications before changing her medical regimen as she has been well controlled in the recent past on the current regimen.  The following medications were removed from the medication list:    Metoprolol Tartrate 100 Mg Tabs (Metoprolol tartrate) ..... One tab by mouth bid Her updated medication list for this problem includes:    Lisinopril-hydrochlorothiazide 20-25 Mg Tabs (Lisinopril-hydrochlorothiazide) ..... One tab by mouth daily  Orders: [facility]- Est  Level 4 (84132) Basic Met-[facility] ([address]-27253) CBC-[facility] (66440)  Complete Medication List: 1)  Bayer Childrens Aspirin 81 Mg Chew (Aspirin) .... Take 1 tablet by mouth once a day 2)  Lantus 100 Unit/ml Soln (Insulin glargine) .... 60 units injected subcutaneously qhs 3)  Lisinopril-hydrochlorothiazide 20-25 Mg Tabs (Lisinopril-hydrochlorothiazide) .... One tab by mouth daily 4)  Levothyroxine Sodium 112 Mcg Tabs (Levothyroxine sodium) .Marland Kitchen.. 1 by mouth daily 5)  Crestor 20 Mg Tabs (Rosuvastatin calcium) .Marland Kitchen.. 1 by mouth at bedtime 6)  Insulin Syringe Ult Thin Short 30g X 5/16" 1 Ml Misc (Insulin syringe-needle u-100) .... Use as directed. 7)  Lancets Misc (Lancets) .... 3 lancets qday 8)  Nexium 20 Mg Cpdr (Esomeprazole magnesium) .... Take 1 tablet every day. 9)  Norco 5-325 Mg Tabs (Hydrocodone-acetaminophen) .... As needed for pain  Other Orders: Influenza Vaccine MCR (34742) Prescriptions: NEXIUM 20 MG CPDR (ESOMEPRAZOLE MAGNESIUM) take 1 tablet every day.  #31 x 1   Entered and Authorized by:   Rodney Langton MD   Signed by:   Rodney Langton MD on 09/05/2010   Method used:   Electronically to        [facility] [address] (retail)       [phone]       [place], [place]  [address]       Ph: [phone]       Fax: [phone]   RxID:    [id number] LANTUS 100 UNIT/ML SOLN (INSULIN GLARGINE) 60 units injected Subcutaneously qHS  #3 months x 3   Entered and Authorized by:   Rodney Langton MD   Signed by:   Rodney Langton MD on 09/05/2010   Method used:   Electronically to        [facility] [address] (retail)       [phone]       [place], [place]  [address]       Ph: [phone]       Fax: [phone]   RxID:   [id number] CRESTOR 20 MG  TABS (ROSUVASTATIN CALCIUM) 1 by mouth at bedtime  #90 x 3   Entered and Authorized by:   Rodney Langton MD   Signed by:   Rodney Langton MD on 09/05/2010   Method used:   Electronically to  [facility] [address] (retail)       [phone]       [place], [place]  [address]       Ph: [phone]       Fax: [phone]   RxID:   [id number] LEVOTHYROXINE SODIUM 112 MCG  TABS (LEVOTHYROXINE SODIUM) 1 by mouth daily  #90 x 0   Entered and Authorized by:   Rodney Langton MD   Signed by:   Rodney Langton MD on 09/05/2010   Method used:   Electronically to        [facility] [address] (retail)       [phone]       [place], [place]  [address]       Ph: [phone]       Fax: [phone]   RxID:   [id number] LISINOPRIL-HYDROCHLOROTHIAZIDE 20-25 MG TABS (LISINOPRIL-HYDROCHLOROTHIAZIDE) One tab by mouth daily  #90 x 3   Entered and Authorized by:   Rodney Langton MD   Signed by:   Rodney Langton MD on 09/05/2010   Method used:   Electronically to        [facility] [address] (retail)       [phone]       [place], [place]  [address]       Ph: [phone]       Fax: [phone]   RxID:   [id number]    Orders Added: 1)  A1C-[facility] [83036] 2)  [facility]- Est  Level 4 [99214] 3)  Basic Met-[facility] [[address]8938-101[address]] 4)  CBC-[facility] [85027] 5)  TSH-[facility] [10258-52778] 6)  Influenza  Vaccine MCR [00025]   Immunizations Administered:  Influenza Vaccine # 1:    Vaccine Type: Fluvax MCR    Site: right deltoid    Mfr: GlaxoSmithKline    Dose: 0.5 ml    Route: IM    Given by: Terese Door    Exp. Date: 02/23/2011    Lot #: [id number]    VIS given: 03/20/10 version given September 05, 2010.  Flu Vaccine Consent Questions:    Do you have a history of severe allergic reactions to this vaccine? no    Any prior history of allergic reactions to egg and/or gelatin? no    Do you have a sensitivity to the preservative Thimersol? no    Do you have a past history of Guillan-Barre Syndrome? no    Do you currently have an acute febrile illness? no    Have you ever had a severe reaction to latex? no    Vaccine information given and explained to patient? yes    Are you currently pregnant? no   Immunizations Administered:  Influenza Vaccine # 1:    Vaccine Type: Fluvax MCR    Site: right deltoid    Mfr: GlaxoSmithKline    Dose: 0.5 ml    Route: IM    Given by: Terese Door    Exp. Date: 02/23/2011    Lot #: [id number]    VIS given: 03/20/10 version given September 05, 2010.  Last Flu Vaccine:  Fluvax MCR (08/11/2009 3:12:57 PM) Flu Vaccine Result Date:  09/05/2010 Flu Vaccine Result:  given Flu Vaccine Next Due:  1 yr Last Colonoscopy:  sessile polyps (08/24/2007 9:47:24 AM) Colonoscopy Next Due:  5 yr Last Diabetes Eye Exam:  no diabetic changes (09/26/2008 3:23:48 PM)  Diabetic Eye Exam Date:  08/29/2010 Diabetes Eye Exam Result:  normal Diabetes Eye Exam Due:  1 yr   Laboratory Results   Blood Tests   Date/Time Received: September 05, 2010 2:09 PM  Date/Time Reported: September 05, 2010 2:33 PM   HGBA1C: 7.9%   (Normal Range: Non-Diabetic - 3-6%   Control Diabetic - 6-8%)  Comments: ...............test performed by......Marland KitchenBonnie A. [place], MLS (ASCP)cm

## 2010-11-06 ENCOUNTER — Encounter: Payer: Self-pay | Admitting: Sports Medicine

## 2010-11-06 ENCOUNTER — Ambulatory Visit (INDEPENDENT_AMBULATORY_CARE_PROVIDER_SITE_OTHER): Payer: 59 | Admitting: Sports Medicine

## 2010-11-06 VITALS — BP 150/84 | HR 69 | Temp 98.0°F | Ht 66.5 in | Wt 259.0 lb

## 2010-11-06 DIAGNOSIS — M5412 Radiculopathy, cervical region: Secondary | ICD-10-CM | POA: Insufficient documentation

## 2010-11-06 MED ORDER — GABAPENTIN 300 MG PO CAPS: ORAL_CAPSULE | ORAL | Status: DC

## 2010-11-06 MED ORDER — PREDNISONE (PAK) 10 MG PO TABS: 10.0000 mg | ORAL_TABLET | Freq: Every day | ORAL | Status: AC

## 2010-11-06 MED ORDER — CYCLOBENZAPRINE HCL 5 MG PO TABS: 5.0000 mg | ORAL_TABLET | Freq: Three times a day (TID) | ORAL | Status: DC | PRN

## 2010-11-06 NOTE — Assessment & Plan Note (Signed)
Some symptoms suggestive of impingement syndrome however with pain to fingers, likely radicular. Scalene spasm: flexeril. Prednisone burst x 5d. Gabapentin up-taper. Rehab exercises. XR c-spine complete.

## 2010-11-06 NOTE — Progress Notes (Signed)
  Subjective:    Patient ID: Kaitlyn Ferguson, female    DOB: 09/26/1945, 65 y.o.   MRN: [id number]  HPI 6 months intermittent R shoulder pain.  Starts R neck to R shoulder to fingertips.  Pt claims to all fingertips.  No injury, unable to describe what movements make it worse.  Hasn't taken any medication for this.  Nothing makes it better.  Not dropping things.  Hx CTS release on R wrist.     Review of Systems    See HPI Objective:   Physical Exam  Constitutional: She appears well-developed and well-nourished. No distress.  Musculoskeletal:       Neck: Inspection unremarkable. No palpable stepoffs. Weakly POSITIVE Spurling's maneuver. neck range of motion limited to 25 deg flexion, extension, rotation, and side bending. Grip strength and sensation normal in bilateral hands Strength good C4 to T1 distribution R side with hypesthesia mostly C8/T1 distribution but contributions noted C6 and C7 as well. Reflexes normal Anterior scalene spasm noted on palpation.  She also has some pain with neers and hawkins but unable to fully assess shoulder 2/2 pain and guarding pain from neck.            Assessment & Plan:

## 2010-11-06 NOTE — Patient Instructions (Signed)
Great to see you,  meds as below. Rehab exercises as below. Xray of your neck. Come back to see me if no better in 2-3 weeks.  -Dr. Karie Schwalbe. Cervical Strain and Sprain (Whiplash) with Rehab   Cervical strain and sprains are injuries that commonly occur with "whiplash" injuries. Whiplash occurs when the neck is forcefully whipped backward or forward, such as during a motor vehicle accident. The muscles, ligaments, tendons, discs and nerves of the neck are susceptible to injury when this occurs.   SYMPTOMS  Pain or stiffness in the front and/or back of neck  Symptoms may present immediately or up to 24 hours after injury.  Dizziness, headache, nausea and vomiting.  Muscle spasm with soreness and stiffness in the neck.  Tenderness and swelling at the injury site.   CAUSES Whiplash injuries often occur during contact sports or motor vehicle accidents.     RISK INCREASES WITH    Osteoarthritis of the spine.  Situations that make head or neck accidents or trauma more likely.  High-risk sports (football, rugby, wrestling, hockey, auto racing, gymnastics, diving, contact karate or boxing).  Poor strength and flexibility of the neck.  Previous neck injury.  Poor tackling technique.  Improperly fitted or padded equipment.   PREVENTIVE MEASURES    Learn and use proper technique (avoid tackling with the head, spearing and head-butting; use proper falling techniques to avoid landing on the head).  Warm up and stretch properly before activity.  Maintain physical fitness: l Strength, flexibility and endurance. l Cardiovascular fitness.  Wear properly fitted and padded protective equipment, such as padded soft collars, for participation in contact sports.   PROGNOSIS Recovery for cervical strain and sprain injuries is dependent on the extent of the injury. These injuries are usually curable in 1 week to 3 months with appropriate treatment.     POSSIBLE COMPLICATIONS    Temporary  numbness and weakness may occur if the nerve roots are damaged, and this may persist until the nerve has completely healed.  Chronic pain due to frequent recurrence of symptoms.  Prolonged healing, especially if activity is resumed too soon (before complete recovery).   GENERAL TREATMENT CONSIDERATIONS Treatment initially involves the use of ice and medication to help reduce pain and inflammation. It is also important to perform strengthening and stretching exercises and modify activities that worsen symptoms so the injury does not get worse. These exercises may be performed at home or with a therapist. For patients who experience severe symptoms, a soft padded collar may be recommended to be worn around the neck.     Improving your posture may help reduce symptoms. Posture improvement includes pulling your chin and abdomen in while sitting or standing. If you are sitting, sit in a firm chair with your buttocks against the back of the chair. While sleeping, try replacing your pillow with a small towel rolled to 2 inches in diameter, or use a cervical pillow or soft cervical collar. Poor sleeping positions delay healing.     For patients with nerve root damage, which causes numbness or weakness, the use of a cervical traction apparatus may be recommended. Surgery is rarely necessary for these injuries. However, cervical strain and sprains that are present at birth (congenital) may require surgery.   MEDICATION    If pain medication is necessary, nonsteroidal anti-inflammatory medications, such as aspirin and ibuprofen, or other minor pain relievers, such as acetaminophen, are often recommended.    Do not take pain medication for 7 days before  surgery.    Prescription pain relievers may be given if deemed necessary by your caregiver. Use only as directed and only as much as you need.   HEAT AND COLD:    Cold treatment (icing) relieves pain and reduces inflammation. Cold treatment should be  applied for 10 to 15 minutes every 2 to 3 hours for inflammation and pain and immediately after any activity that aggravates your symptoms. Use ice packs or an ice massage.  Heat treatment may be used prior to performing the stretching and strengthening activities prescribed by your caregiver, physical therapist, or athletic trainer. Use a heat pack or a warm soak.   SEEK MEDICAL CARE IF:    Symptoms get worse or do not improve in 2 weeks despite treatment.  New, unexplained symptoms develop (drugs used in treatment may produce side effects).     EXERCISES   RANGE OF MOTION AND STRETCHING EXERCISES - Cervical Strain and Sprain These exercises may help you when beginning to rehabilitate your injury. In order to successfully resolve your symptoms, you must improve your posture. These exercises are designed to help reduce the forward-head and rounded-shoulder posture which contributes to this condition. Your symptoms may resolve with or without further involvement from your physician, physical therapist or athletic trainer. While completing these exercises, remember:    Restoring tissue flexibility helps normal motion to return to the joints. This allows healthier, less painful movement and activity.  An effective stretch should be held for at least 20 seconds, although you may need to begin with shorter hold times for comfort.  A stretch should never be painful. You should only feel a gentle lengthening or release in the stretched tissue.      STRETCH- Axial Extensors  Lie on your back on the floor. You may bend your knees for comfort. Place a rolled up hand towel or dish towel, about 2 inches in diameter, under the part of your head that makes contact with the floor.  Gently tuck your chin, as if trying to make a "double chin," until you feel a gentle stretch at the base of your head.  Hold ____15______ seconds.   Repeat time____1______ times. Complete this exercise _____3_____ times per  day.      STRETECH - Axial Extension   Stand or sit on a firm surface. Assume a good posture: chest up, shoulders drawn back, abdominal muscles slightly tense, knees unlocked (if standing) and feet hip width apart.  Slowly retract your chin so your head slides back and your chin slightly lowers.  Continue to look straight ahead.  You should feel a gentle stretch in the back of your head. Be certain not to feel an aggressive stretch since this can cause headaches later.  Hold for ____15______ seconds. Repeat _____1____ times. Complete this exercise ___3_____ times per day.    STRETCH  - Cervical Side Bend   Stand or sit on a firm surface. Assume a good posture: chest up, shoulders drawn back, abdominal muscles slightly tense, knees unlocked (if standing) and feet hip width apart.  Without letting your nose or shoulders move, slowly tip your __________ ear to your shoulder until your feel a gentle stretch in the muscles on the opposite side of your neck.     Hold ___15____ seconds. Repeat ___1___ times. Complete this exercise __3_ times per day.    STRETCH - Cervical Rotators   Stand or sit on a firm surface. Assume a good posture: chest up, shoulders drawn back, abdominal muscles  slightly tense, knees unlocked (if standing) and feet hip width apart.  Keeping your eyes level with the ground, slowly turn your head until you feel a gentle stretch along the back and opposite side of your neck.  Hold ___15___ seconds. Repeat ___1___ times. Complete this exercise _3______ times per day.     RANGE OF MOTION - Neck Circles   Stand or sit on a firm surface. Assume a good posture: chest up, shoulders drawn back, abdominal muscles slightly tense, knees unlocked (if standing) and feet hip width apart.  Gently roll your head down and around from the back of one shoulder to the back of the other. The motion should never be forced or painful.  Repeat the motion 10-20 times, or until you feel  the neck muscles relax and loosen. Repeat ___1____ times. Complete the exercise ____3____ times per day.     STRENGTHENING EXERCISES - Cervical Strain and Sprain These exercises may help you when beginning to rehabilitate your injury. They may resolve your symptoms with or without further involvement from your physician, physical therapist or athletic trainer. While completing these exercises, remember:    Muscles can gain both the endurance and the strength needed for everyday activities through controlled exercises.  Complete these exercises as instructed by your physician, physical therapist or athletic trainer. Progress the resistance and repetitions only as guided.  You may experience muscle soreness or fatigue, but the pain or discomfort you are trying to eliminate should never worsen during these exercises. If this pain does worsen, stop and make certain you are following the directions exactly. If the pain is still present after adjustments, discontinue the exercise until you can discuss the trouble with your clinician.    STRENGTH - Cervical Flexors, Isometric  Face a wall, standing about 6 inches away. Place a small pillow, a ball about 6-8 inches in diameter, or a folded towel between your forehead and the wall.    Slightly tuck your chin and gently push your forehead into the soft object. Push only with mild to moderate intensity, building up tension gradually. Keep your jaw and forehead relaxed.  Hold 10 to 20 seconds. Keep your breathing relaxed.  Release the tension slowly. Relax your neck muscles completely before you start the next repetition.   Repeat ____1____ times. Complete this exercise __3______ times per day.     STRENGTH- Cervical Lateral Flexors, Isometric   Stand about 6 inches away from a wall. Place a small pillow, a ball about 6-8 inches in diameter, or a folded towel between the side of your head and the wall.  Slightly tuck your chin and gently tilt your  head into the soft object. Push only with mild to moderate intensity, building up tension gradually. Keep your jaw and forehead relaxed.  Hold 10 to 20 seconds. Keep your breathing relaxed.  Release the tension slowly. Relax your neck muscles completely before you start the next repetition.   Repeat _____1__ times. Complete this exercise ___3____ times per day.    STRENGTH - Cervical Extensors, Isometric   Stand about 6 inches away from a wall. Place a small pillow, a ball about 6-8 inches in diameter, or a folded towel between the back of your head and the wall.  Slightly tuck your chin and gently tilt your head back into the soft object. Push only with mild to moderate intensity, building up tension gradually. Keep your jaw and forehead relaxed.  Hold 10 to 20 seconds. Keep your breathing relaxed.  Release the tension slowly. Relax your neck muscles completely before you start the next repetition.   Repeat ___1__ times. Complete this exercise ___3__ times per day.     POSTURE AND BODY MECHANICS CONSIDERATIONS - Cervical Strain and Sprain Keeping correct posture when sitting, standing or completing your activities will reduce the stress put on different body tissues, allowing injured tissues a chance to heal and limiting painful experiences. The following are general guidelines for improved posture. Your physician or physical therapist will provide you with any instructions specific to your needs. While reading these guidelines, remember:  The exercises prescribed by your provider will help you have the flexibility and strength to maintain correct postures.  The correct posture provides the optimal environment for your joints to work. All of your joints have less wear and tear when properly supported by a spine with good posture. This means you will experience a healthier, less painful body.  Correct posture must be practiced with all of your activities, especially prolonged sitting and  standing.  Correct posture is as important when doing repetitive low-stress activities (typing) as it is when doing a single heavy-load activity (lifting).     PROLONGED STANDING WHILE SLIGHTLY LEANING FORWARD  When completing a task that requires you to lean forward while standing in one place for a long time, place either foot up on a stationary 2-4 inch high object to help maintain the best posture. When both feet are on the ground, the low back tends to lose its slight inward curve. If this curve flattens (or becomes too large), then the back and your other joints will experience too much stress, fatigue more quickly and can cause pain.     RESTING POSITIONS Consider which positions are most painful for you when choosing a resting position. If you have pain with flexion-based activities (sitting, bending, stooping, squatting), choose a position that allows you to rest in a less flexed posture. You would want to avoid curling into a fetal position on your side. If your pain worsens with extension-based activities (prolonged standing, working overhead), avoid resting in an extended position such as sleeping on your stomach. Most people will find more comfort when they rest with their spine in a more neutral position, neither too rounded nor too arched. Lying on a non-sagging bed on your side with a pillow between your knees, or on your back with a pillow under your knees will often provide some relief. Keep in mind, being in any one position for a prolonged period of time, no matter how correct your posture, can still lead to stiffness.    WALKING Walk with an upright posture. Your ears, shoulders and hips should all line-up.    OFFICE WORK When working at a desk, create an environment that supports good, upright posture. Without extra support, muscles fatigue and lead to excessive strain on joints and other tissues.    CHAIR:    A chair should be able to slide under your desk when your back  makes contact with the back of the chair. This allows you to work closely.  The chair's height should allow your eyes to be level with the upper part of your monitor and your hands to be slightly lower than your elbows.   BODY POSITION  Your feet should make contact with the floor. If this is not possible, use a foot rest.  Keep your ears over your shoulders. This will reduce stress on your neck and low back.   Document  Released: 08/12/2005  Document Re-Released: 06/09/2009 [facility] Patient Information 2011 [place], [place].

## 2010-11-07 ENCOUNTER — Ambulatory Visit
Admission: RE | Admit: 2010-11-07 | Discharge: 2010-11-07 | Disposition: A | Discharge status: Home / Self Care | Payer: 59 | Source: Ambulatory Visit | Attending: Family Medicine | Admitting: Family Medicine

## 2010-11-07 DIAGNOSIS — M5412 Radiculopathy, cervical region: Secondary | ICD-10-CM

## 2010-11-09 LAB — GLUCOSE, CAPILLARY
Glucose-Capillary: 113 mg/dL — ABNORMAL HIGH (ref 70–99)
Glucose-Capillary: 114 mg/dL — ABNORMAL HIGH (ref 70–99)
Glucose-Capillary: 119 mg/dL — ABNORMAL HIGH (ref 70–99)
Glucose-Capillary: 122 mg/dL — ABNORMAL HIGH (ref 70–99)
Glucose-Capillary: 141 mg/dL — ABNORMAL HIGH (ref 70–99)
Glucose-Capillary: 158 mg/dL — ABNORMAL HIGH (ref 70–99)
Glucose-Capillary: 160 mg/dL — ABNORMAL HIGH (ref 70–99)
Glucose-Capillary: 167 mg/dL — ABNORMAL HIGH (ref 70–99)
Glucose-Capillary: 173 mg/dL — ABNORMAL HIGH (ref 70–99)
Glucose-Capillary: 192 mg/dL — ABNORMAL HIGH (ref 70–99)
Glucose-Capillary: 60 mg/dL — ABNORMAL LOW (ref 70–99)
Glucose-Capillary: 81 mg/dL (ref 70–99)

## 2010-11-09 LAB — SURGICAL PCR SCREEN
MRSA, PCR: NEGATIVE
Staphylococcus aureus: NEGATIVE

## 2010-11-09 LAB — COMPREHENSIVE METABOLIC PANEL
ALT: 13 U/L (ref 0–35)
AST: 19 U/L (ref 0–37)
Albumin: 4 g/dL (ref 3.5–5.2)
Alkaline Phosphatase: 82 U/L (ref 39–117)
BUN: 20 mg/dL (ref 6–23)
CO2: 32 mEq/L (ref 19–32)
Calcium: 9.8 mg/dL (ref 8.4–10.5)
Chloride: 97 mEq/L (ref 96–112)
Creatinine, Ser: 0.89 mg/dL (ref 0.4–1.2)
GFR calc Af Amer: >60 mL/min (ref >60)
GFR calc non Af Amer: >60 mL/min (ref >60)
Glucose, Bld: 90 mg/dL (ref 70–99)
Potassium: 3.4 mEq/L — ABNORMAL LOW (ref 3.5–5.1)
Sodium: 139 mEq/L (ref 135–145)
Total Bilirubin: 0.4 mg/dL (ref 0.3–1.2)
Total Protein: 7.8 g/dL (ref 6.0–8.3)

## 2010-11-09 LAB — DIFFERENTIAL
Basophils Absolute: 0 10*3/uL (ref 0.0–0.1)
Basophils Relative: 0 % (ref 0–1)
Eosinophils Absolute: 0.1 10*3/uL (ref 0.0–0.7)
Eosinophils Relative: 1 % (ref 0–5)
Lymphocytes Relative: 22 % (ref 12–46)
Lymphs Abs: 3 10*3/uL (ref 0.7–4.0)
Monocytes Absolute: 0.9 10*3/uL (ref 0.1–1.0)
Monocytes Relative: 6 % (ref 3–12)
Neutro Abs: 9.7 10*3/uL — ABNORMAL HIGH (ref 1.7–7.7)
Neutrophils Relative %: 71 % (ref 43–77)

## 2010-11-09 LAB — CBC
HCT: 40.2 % (ref 36.0–46.0)
Hemoglobin: 12.9 g/dL (ref 12.0–15.0)
MCH: 26.8 pg (ref 26.0–34.0)
MCHC: 32.1 g/dL (ref 30.0–36.0)
MCV: 83.4 fL (ref 78.0–100.0)
Platelets: 348 10*3/uL (ref 150–400)
RBC: 4.82 MIL/uL (ref 3.87–5.11)
RDW: 15.6 % — ABNORMAL HIGH (ref 11.5–15.5)
WBC: 13.8 10*3/uL — ABNORMAL HIGH (ref 4.0–10.5)

## 2010-11-20 ENCOUNTER — Other Ambulatory Visit: Payer: Self-pay | Admitting: Sports Medicine

## 2010-11-20 DIAGNOSIS — E119 Type 2 diabetes mellitus without complications: Secondary | ICD-10-CM

## 2010-11-20 MED ORDER — "INSULIN SYRINGE-NEEDLE U-100 31G X 5/16"" 1 ML MISC": Status: DC

## 2010-11-20 MED ORDER — BD LANCET ULTRAFINE 33G MISC: Status: DC

## 2010-11-26 ENCOUNTER — Ambulatory Visit: Payer: 59 | Admitting: Sports Medicine

## 2010-11-26 ENCOUNTER — Ambulatory Visit: Payer: 59

## 2010-11-27 ENCOUNTER — Telehealth: Payer: Self-pay | Admitting: Sports Medicine

## 2010-11-27 NOTE — Telephone Encounter (Signed)
Pt dropped off form to be completed. Given to white team for any clinical completion.

## 2010-11-27 NOTE — Telephone Encounter (Signed)
Kaitlyn Ferguson,   Filled out as much as I possibly could on the form (unsure about some parts of form didn't want to make any errors). Placed form in your box for completion.Kaitlyn Ferguson

## 2010-11-29 NOTE — Telephone Encounter (Signed)
Form completed and in to-do box

## 2010-12-11 ENCOUNTER — Ambulatory Visit (INDEPENDENT_AMBULATORY_CARE_PROVIDER_SITE_OTHER): Payer: 59 | Admitting: Sports Medicine

## 2010-12-11 ENCOUNTER — Encounter: Payer: Self-pay | Admitting: Sports Medicine

## 2010-12-11 VITALS — BP 136/80 | HR 82 | Temp 98.2°F | Resp 20 | Wt 259.0 lb

## 2010-12-11 DIAGNOSIS — E119 Type 2 diabetes mellitus without complications: Secondary | ICD-10-CM

## 2010-12-11 DIAGNOSIS — M5412 Radiculopathy, cervical region: Secondary | ICD-10-CM

## 2010-12-11 DIAGNOSIS — I10 Essential (primary) hypertension: Secondary | ICD-10-CM

## 2010-12-11 LAB — POCT GLYCOSYLATED HEMOGLOBIN (HGB A1C): Hemoglobin A1C: 10.9

## 2010-12-11 MED ORDER — GABAPENTIN 800 MG PO TABS: 800.0000 mg | ORAL_TABLET | Freq: Three times a day (TID) | ORAL | Status: DC

## 2010-12-11 MED ORDER — INSULIN GLARGINE 100 UNIT/ML ~~LOC~~ SOLN: SUBCUTANEOUS | Status: DC

## 2010-12-11 MED ORDER — KETOROLAC TROMETHAMINE 30 MG/ML IJ SOLN
30.0000 mg | Freq: Once | INTRAMUSCULAR | Status: AC
  Administered 2010-12-11: 30 mg via INTRAVENOUS

## 2010-12-11 MED ORDER — HYDROCODONE-ACETAMINOPHEN 5-500 MG PO TABS: 1.0000 | ORAL_TABLET | Freq: Four times a day (QID) | ORAL | Status: DC | PRN

## 2010-12-11 NOTE — Assessment & Plan Note (Signed)
Pt was not taking prednisone. Will increase gabapentin to 800mg  tabs TID. Refilling Norco (hadnt been taking this and didn't have any) Toradol in office. C spine xr with spondylosis. RTC 1 month to reassess.

## 2010-12-11 NOTE — Assessment & Plan Note (Signed)
A1c very elevated. Pt admits to dietary indiscretions. Will have pt increase lantus by 2 units every time AM fasting CBG is >100. RTC 1 month to reassess CBGs.

## 2010-12-11 NOTE — Patient Instructions (Signed)
Great to see you, Be sure to take your prednisone. Changing gabapentin to 800mg  3x a day. Start taking the vicodin as well for pain. Toradol shot in office. Increase your lantus by 2 units every morning your fasting glucose is >100. Come back to see me in a month to see how things are going.  Ihor Austin. Benjamin Stain, M.D.

## 2010-12-11 NOTE — Assessment & Plan Note (Signed)
In adequate control today.

## 2010-12-11 NOTE — Progress Notes (Signed)
  Subjective:    Patient ID: Kaitlyn Ferguson, female    DOB: 14-Jul-1946, 65 y.o.   MRN: [id number]  HPI DM2:  A1c 10.9.  Pt admits to dietary indiscretions.  CBGs high throughout the day. Using her lantus and in fact increased to 65 units daily.  No lows.    Cervical radiculopathy:  Not better, did not take prednisone, currently at gabapentin 600 TID, not taking any vicodin.  She is exercising and feels that a shot of toradol would be more painful than her neck/arm pain.   Review of Systems    See HPI Objective:   Physical Exam  Constitutional: She appears well-developed and well-nourished. No distress.  Skin: Skin is warm and dry.          Assessment & Plan:

## 2011-01-10 ENCOUNTER — Encounter: Payer: Self-pay | Admitting: Sports Medicine

## 2011-01-10 ENCOUNTER — Ambulatory Visit (INDEPENDENT_AMBULATORY_CARE_PROVIDER_SITE_OTHER): Payer: 59 | Admitting: Sports Medicine

## 2011-01-10 DIAGNOSIS — E119 Type 2 diabetes mellitus without complications: Secondary | ICD-10-CM

## 2011-01-10 DIAGNOSIS — M5412 Radiculopathy, cervical region: Secondary | ICD-10-CM

## 2011-01-10 MED ORDER — INSULIN GLARGINE 100 UNIT/ML ~~LOC~~ SOLN: SUBCUTANEOUS | Status: DC

## 2011-01-10 MED ORDER — HYDROCODONE-ACETAMINOPHEN 7.5-500 MG PO TABS: 1.0000 | ORAL_TABLET | Freq: Four times a day (QID) | ORAL | Status: DC | PRN

## 2011-01-10 NOTE — Patient Instructions (Signed)
Great to see you, [name]  Kaitlyn Ferguson. Benjamin Stain, M.D.

## 2011-01-10 NOTE — Assessment & Plan Note (Signed)
We are headed towards novolog meal coverage. Pt wishes to keep incr lantus and recheck A1c in June, if still high, she agrees to try novolog with dinner. RTC end of June to check A1c. Will reassess then.

## 2011-01-10 NOTE — Progress Notes (Signed)
  Subjective:    Patient ID: Kaitlyn Ferguson, female    DOB: March 03, 1946, 65 y.o.   MRN: [id number]  HPI Cervical radiculopathy:  Still with pain, better on current regimen of neurontin and vicodin.  Doing exercises.  No bowel/bladder problems.  Vicodin eases her pain but not sufficiently.  DM2:  CBGs better but still high, A1c was in the 10's in the past. AM:  140's-160's.  High 200s, low 86. PM:  300's-400s  Dinner at 6pm, checks PM cbg 8pm. Currently taking 84 units lantus. Exercising, notes CBGs lower after exercising.   Review of Systems See HPI    Objective:   Physical Exam  Constitutional: She appears well-developed and well-nourished. No distress.  Cardiovascular: Normal rate, regular rhythm and normal heart sounds.  Exam reveals no gallop and no friction rub.   No murmur heard. Pulmonary/Chest: Effort normal and breath sounds normal. No respiratory distress. She has no wheezes. She has no rales. She exhibits no tenderness.  Musculoskeletal: She exhibits no edema.  Skin: Skin is warm and dry. She is not diaphoretic.          Assessment & Plan:

## 2011-01-10 NOTE — Assessment & Plan Note (Signed)
Symptoms stable, improved with vicodin/neurontin. Will increase vicodin to 7.5/500. RTC prn.

## 2011-01-11 NOTE — Discharge Summary (Signed)
NAME:  Kaitlyn Ferguson, Kaitlyn Ferguson                     ACCOUNT NO.:  [id number]   MEDICAL RECORD NO.:  [id number]                   PATIENT TYPE:  INP   LOCATION:  6527                                 FACILITY:  [facility]   PHYSICIAN:  Learta Codding, M.D.                 DATE OF BIRTH:  26-Jun-1946   DATE OF ADMISSION:  05/26/2003  DATE OF DISCHARGE:  05/28/2003                           DISCHARGE SUMMARY - REFERRING   DISCHARGE DIAGNOSES:  1. Chest pain felt to be noncardiac in nature.  2. Hypertension, treated.  3. Hypothyroidism, treated.  4. Diabetes mellitus, oral agent dependent.  5. Degenerative joint disease.  6. Status post back surgery.  7. Status post hysterectomy.  8. Status post ankle surgery.  9. Family history of coronary artery disease.   HISTORY OF PRESENT ILLNESS:  Kaitlyn Ferguson is a 65 year old female with no  known history of coronary artery disease.  She has had significant risk  factors including diabetes, hypertension, hypothyroidism, family history of  early coronary artery disease who presents to the emergency room for  evaluation of chest pain to progressively worsening systolic chest pressure  over the past two weeks occurring with exertion and relieved by rest.  She  did have radiation into both arms.   LABORATORY DATA:  Lab studies during the patient's hospital stay include  sodium 136, potassium 2.8 which was repleted, chloride 46, CO2 28, BUN 7,  creatinine 0.9.  Hemoglobin 12.5, hematocrit 37.6, white count 7.4,  platelets 270.  Cardiac isoenzymes negative.  Total cholesterol 264,  triglycerides 310, LDL 150, HDL 52.  TSH 90.396.   Her EKG reveals sinus bradycardia, rate 57 with T-wave inversion in the  inferior lateral leads.   HOSPITAL COURSE:  She ultimately underwent cardiac catheterization on  May 27, 2003, and was found to have angiographically normal coronary  arteries.  There was normal LV function.  At this point, no further cardiac  work-up  was entertained.  The following day, she remained stable.  She was  ambulating without difficulty.  She was discharged home in stable condition.   DISCHARGE MEDICATIONS:  1. Norvasc 5 mg a day.  2. Potassium 20 mEq one a day.  3. Enteric coated aspirin 325 mg a day.  4. Synthroid 25 mcg one p.o. daily.  5. Glucotrol 10 mg b.i.d.  6. Accuretic 20/25 once a day.  7. She is to restart her Glucophage on May 30, 2003.  8. Tylenol one or two tablets every six hours as needed for pain.   ACTIVITY:  No strenuous activity or driving for two days.  Gradually  increase activity after two days.   DIET:  Remain on a low fat, diabetic diet.   DISCHARGE INSTRUCTIONS:  Clean catheterization with soap and water.  Call  for questions or concerns.   FOLLOW UP:  The office will call for follow-up appointment in approximately  10 days.  She will need a  BMET in one week to recheck her potassium level  which was repleted.  At this point, she will need to go ahead and set up  other follow-up appointments with family practice.      Kaitlyn Ferguson, P.A. [facility]                      Learta Codding, M.D.    LB/MEDQ  D:  06/06/2003  T:  06/06/2003  Job:  [id number]
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
## 2011-01-11 NOTE — Cardiovascular Report (Signed)
   NAME:  Kaitlyn Ferguson, Kaitlyn Ferguson                     ACCOUNT NO.:  [id number]   MEDICAL RECORD NO.:  [id number]                   PATIENT TYPE:  INP   LOCATION:  6527                                 FACILITY:  [facility]   PHYSICIAN:  Rollene Rotunda, M.D.                DATE OF BIRTH:  1946/05/22   DATE OF PROCEDURE:  05/27/2003  DATE OF DISCHARGE:  05/28/2003                              CARDIAC CATHETERIZATION   PRIMARY CARE PHYSICIAN:  Barney Drain, M.D., [facility]   PROCEDURE:  Left heart cardiac catheterization/coronary arteriography.   CARDIOLOGIST:  Rollene Rotunda, M.D.   INDICATIONS:  Evaluate patient with chest pain (786.51).  She did have an  abnormal EKG with sinus bradycardia, incomplete right bundle branch block,  inferolateral T-wave changes, possible ischemia.   DESCRIPTION OF PROCEDURE:  Left heart catheterization was performed via the  right femoral artery.  The artery was cannulated using an anterior wall  puncture.  A #6 French arterial sheath was inserted via the modified  Seldinger technique.  Preformed Judkins and a pigtail catheter were  utilized.  The  patient tolerated the procedure well and left the lab in stable condition.   RESULTS:  HEMODYNAMICS:  LV 192/17, AO 192/91.   CORONARY ARTERIOGRAPHY:  The left main coronary artery was normal.   Left anterior descending was normal.  There was a large first diagonal which  was normal.  There was a large second diagonal which was normal.   The circumflex coronary artery in the AV groove was normal.  There was a  large branching mid obtuse marginal which was normal.  The right coronary  artery was dominant and normal.   LEFT VENTRICULOGRAM:  The left ventriculogram was obtained in the RAO  projection.  The ejection fraction was 65% with normal wall motion.    CONCLUSION:  1. Normal coronary arteries.  2. Normal left ventricular function.   PLAN:  No further cardiac workup is  suggested.                                               Rollene Rotunda, M.D.    JH/MEDQ  D:  05/27/2003  T:  05/29/2003  Job:  [id number]   cc:   Barney Drain, M.D.  [facility].  Family Prac. Resident  [place]  [place] [address]  Fax: [phone]

## 2011-02-05 ENCOUNTER — Encounter: Payer: Self-pay | Admitting: Sports Medicine

## 2011-02-12 ENCOUNTER — Other Ambulatory Visit: Payer: Self-pay | Admitting: Sports Medicine

## 2011-02-12 DIAGNOSIS — Z1231 Encounter for screening mammogram for malignant neoplasm of breast: Secondary | ICD-10-CM

## 2011-02-20 ENCOUNTER — Ambulatory Visit (INDEPENDENT_AMBULATORY_CARE_PROVIDER_SITE_OTHER): Payer: 59 | Admitting: Sports Medicine

## 2011-02-20 ENCOUNTER — Encounter: Payer: Self-pay | Admitting: Sports Medicine

## 2011-02-20 VITALS — BP 133/80 | HR 76 | Temp 98.4°F | Ht 66.6 in | Wt 260.0 lb

## 2011-02-20 DIAGNOSIS — M5412 Radiculopathy, cervical region: Secondary | ICD-10-CM

## 2011-02-20 DIAGNOSIS — E119 Type 2 diabetes mellitus without complications: Secondary | ICD-10-CM

## 2011-02-20 LAB — POCT GLYCOSYLATED HEMOGLOBIN (HGB A1C): Hemoglobin A1C: 9.8

## 2011-02-20 MED ORDER — INSULIN ASPART 100 UNIT/ML ~~LOC~~ SOLN: 5.0000 [IU] | Freq: Every day | SUBCUTANEOUS | Status: DC

## 2011-02-20 MED ORDER — GABAPENTIN 800 MG PO TABS: 1600.0000 mg | ORAL_TABLET | Freq: Two times a day (BID) | ORAL | Status: DC

## 2011-02-20 MED ORDER — HYDROCODONE-ACETAMINOPHEN 10-500 MG PO TABS: 1.0000 | ORAL_TABLET | Freq: Four times a day (QID) | ORAL | Status: AC | PRN

## 2011-02-20 NOTE — Patient Instructions (Addendum)
Great to see you. Increased gabapentin to 1,600 2x a day. Increased your hydrocodone. START novolog insulin 5 units with dinner time, Keep a log of your blood sugars and bring back in 1 month.    Kaitlyn Ferguson. Benjamin Stain, M.D. [facility] [address] [place] [address] [phone]

## 2011-02-20 NOTE — Assessment & Plan Note (Signed)
Will increase gabapentin to 1600 BID. Increasing lortab. If no better despite this then she has maximized medical mgt and I would have her referred to ortho/spine.

## 2011-02-20 NOTE — Assessment & Plan Note (Signed)
Need to add dinnertime novolog. Will start novolog 5. Pt to keep log of CBGs.

## 2011-02-20 NOTE — Progress Notes (Signed)
  Subjective:    Patient ID: Kaitlyn Ferguson, female    DOB: [birth year], 65 y.o.   MRN: [id number]  HPI Cervical radiculopathy:  Better initially with gabapentin 800 TID, lortab 7.5.  Pain now worsened.  She is not doing her rehab exercises.  No bowel/bladder problems.   DM2:  CBGs elevated:  AM usually run 100s, PM usually run 200's-300's.  Lowest was 70's, felt ok.   Review of Systems See HPI    Objective:   Physical Exam  Constitutional: She appears well-developed and well-nourished. No distress.  Cardiovascular: Normal rate and normal heart sounds.  Exam reveals no gallop and no friction rub.   No murmur heard. Pulmonary/Chest: Effort normal. No respiratory distress. She has no wheezes. She has no rales. She exhibits no tenderness.  Skin: Skin is warm and dry. She is not diaphoretic.          Assessment & Plan:

## 2011-02-21 ENCOUNTER — Other Ambulatory Visit: Payer: Self-pay | Admitting: Sports Medicine

## 2011-02-21 NOTE — Telephone Encounter (Signed)
Refill request

## 2011-02-22 ENCOUNTER — Telehealth: Payer: Self-pay | Admitting: Sports Medicine

## 2011-02-22 NOTE — Telephone Encounter (Signed)
Has a question about her novalog injections.

## 2011-02-25 NOTE — Telephone Encounter (Signed)
Patient states her BG at bedtime was 179.  She injected 5 units of insulin in addition to Lantus.  Morning glucose was 65 and she was afraid it was too low.  Told patient to hold bedtime insulin dose if BG < 170.  If it is above 170, she can use 5-10 units.  Patient agreed with plan.  If this is my patient now, I will need to review chart to see if patient was given a SSI.  According to patient, she does not have one.

## 2011-03-01 ENCOUNTER — Telehealth: Payer: Self-pay | Admitting: Family Medicine

## 2011-03-01 ENCOUNTER — Emergency Department (HOSPITAL_COMMUNITY)
Admission: EM | Admit: 2011-03-01 | Discharge: 2011-03-01 | Disposition: A | Discharge status: Home / Self Care | Payer: Medicare PPO | Attending: Emergency Medicine | Admitting: Emergency Medicine

## 2011-03-01 DIAGNOSIS — E7800 Pure hypercholesterolemia, unspecified: Secondary | ICD-10-CM | POA: Insufficient documentation

## 2011-03-01 DIAGNOSIS — I10 Essential (primary) hypertension: Secondary | ICD-10-CM | POA: Insufficient documentation

## 2011-03-01 DIAGNOSIS — T383X1A Poisoning by insulin and oral hypoglycemic [antidiabetic] drugs, accidental (unintentional), initial encounter: Secondary | ICD-10-CM | POA: Insufficient documentation

## 2011-03-01 DIAGNOSIS — Y92009 Unspecified place in unspecified non-institutional (private) residence as the place of occurrence of the external cause: Secondary | ICD-10-CM | POA: Insufficient documentation

## 2011-03-01 DIAGNOSIS — E119 Type 2 diabetes mellitus without complications: Secondary | ICD-10-CM | POA: Insufficient documentation

## 2011-03-01 DIAGNOSIS — T38801A Poisoning by unspecified hormones and synthetic substitutes, accidental (unintentional), initial encounter: Secondary | ICD-10-CM | POA: Insufficient documentation

## 2011-03-01 DIAGNOSIS — E0590 Thyrotoxicosis, unspecified without thyrotoxic crisis or storm: Secondary | ICD-10-CM | POA: Insufficient documentation

## 2011-03-01 LAB — POCT I-STAT, CHEM 8
BUN: 21 mg/dL (ref 6–23)
Calcium, Ion: 1.24 mmol/L (ref 1.12–1.32)
Chloride: 101 mEq/L (ref 96–112)
Creatinine, Ser: 0.9 mg/dL (ref 0.50–1.10)
Glucose, Bld: 62 mg/dL — ABNORMAL LOW (ref 70–99)
HCT: 39 % (ref 36.0–46.0)
Hemoglobin: 13.3 g/dL (ref 12.0–15.0)
Potassium: 3 mEq/L — ABNORMAL LOW (ref 3.5–5.1)
Sodium: 141 mEq/L (ref 135–145)
TCO2: 31 mmol/L (ref 0–100)

## 2011-03-01 LAB — GLUCOSE, CAPILLARY: Glucose-Capillary: 118 mg/dL — ABNORMAL HIGH (ref 70–99)

## 2011-03-01 NOTE — Telephone Encounter (Signed)
Pt called to report taking 98 units of novolog instead of 5 units of novolog and 98 units of lantus.  This occurred about 15 minutes ago.  Blood sugar currently is 278.  No signs/symptoms of hypoglycemia.  Reviewed with the patient those signs and symptoms, instructed her to eat several handfuls of crackers, and any candy she has around, and come to the emergency room immediately.  Instructed her not to drive herself.  Told her to call 911 either if she started feeling sick or if she was unable to get transportation in the next 15-20 minutes.  Pt expresses understanding.

## 2011-03-02 LAB — GLUCOSE, CAPILLARY
Glucose-Capillary: 120 mg/dL — ABNORMAL HIGH (ref 70–99)
Glucose-Capillary: 123 mg/dL — ABNORMAL HIGH (ref 70–99)

## 2011-03-17 ENCOUNTER — Emergency Department (HOSPITAL_COMMUNITY)
Admission: EM | Admit: 2011-03-17 | Discharge: 2011-03-18 | Disposition: A | Discharge status: Home / Self Care | Payer: Medicare PPO | Attending: Emergency Medicine | Admitting: Emergency Medicine

## 2011-03-17 ENCOUNTER — Emergency Department (HOSPITAL_COMMUNITY): Payer: Medicare PPO

## 2011-03-17 ENCOUNTER — Telehealth: Payer: Self-pay | Admitting: Family Medicine

## 2011-03-17 DIAGNOSIS — E0590 Thyrotoxicosis, unspecified without thyrotoxic crisis or storm: Secondary | ICD-10-CM | POA: Insufficient documentation

## 2011-03-17 DIAGNOSIS — E119 Type 2 diabetes mellitus without complications: Secondary | ICD-10-CM | POA: Insufficient documentation

## 2011-03-17 DIAGNOSIS — E7800 Pure hypercholesterolemia, unspecified: Secondary | ICD-10-CM | POA: Insufficient documentation

## 2011-03-17 DIAGNOSIS — I10 Essential (primary) hypertension: Secondary | ICD-10-CM | POA: Insufficient documentation

## 2011-03-17 DIAGNOSIS — R5381 Other malaise: Secondary | ICD-10-CM | POA: Insufficient documentation

## 2011-03-17 DIAGNOSIS — R110 Nausea: Secondary | ICD-10-CM | POA: Insufficient documentation

## 2011-03-17 DIAGNOSIS — R5383 Other fatigue: Secondary | ICD-10-CM | POA: Insufficient documentation

## 2011-03-17 LAB — COMPREHENSIVE METABOLIC PANEL
ALT: 18 U/L (ref 0–35)
AST: 15 U/L (ref 0–37)
Albumin: 3.9 g/dL (ref 3.5–5.2)
Alkaline Phosphatase: 83 U/L (ref 39–117)
BUN: 14 mg/dL (ref 6–23)
CO2: 31 mEq/L (ref 19–32)
Calcium: 9.9 mg/dL (ref 8.4–10.5)
Chloride: 101 mEq/L (ref 96–112)
Creatinine, Ser: 0.75 mg/dL (ref 0.50–1.10)
GFR calc Af Amer: >60 mL/min (ref >60)
GFR calc non Af Amer: >60 mL/min (ref >60)
Glucose, Bld: 314 mg/dL — ABNORMAL HIGH (ref 70–99)
Potassium: 4 mEq/L (ref 3.5–5.1)
Sodium: 140 mEq/L (ref 135–145)
Total Bilirubin: 0.2 mg/dL — ABNORMAL LOW (ref 0.3–1.2)
Total Protein: 7.4 g/dL (ref 6.0–8.3)

## 2011-03-17 LAB — LIPASE, BLOOD: Lipase: 37 U/L (ref 11–59)

## 2011-03-17 NOTE — Telephone Encounter (Signed)
Received call from pt for generalized malaise, nausea, sweating, generalized swelling. Pt states that she has felt this way since earlier today. Has been able to maintain po intake. Pt states that she went to [employer] earlier today. Pt states that she has kept this down. No fevers, chills, rashes, diarrhea. Unsure of sick contacts. Pt also feels short of breath. Pt states that she never felt like this before. Pt states " I just feel sick". Told pt to go to UC/ED for evaluation with major concern to rule out volume overload. Pt agreeable to plan.

## 2011-03-18 ENCOUNTER — Ambulatory Visit (INDEPENDENT_AMBULATORY_CARE_PROVIDER_SITE_OTHER): Payer: 59 | Admitting: Family Medicine

## 2011-03-18 ENCOUNTER — Telehealth: Payer: Self-pay | Admitting: Family Medicine

## 2011-03-18 ENCOUNTER — Ambulatory Visit: Payer: 59

## 2011-03-18 ENCOUNTER — Emergency Department (HOSPITAL_COMMUNITY)
Admission: EM | Admit: 2011-03-18 | Discharge: 2011-03-19 | Disposition: A | Discharge status: Home / Self Care | Payer: Medicare PPO | Attending: Emergency Medicine | Admitting: Emergency Medicine

## 2011-03-18 ENCOUNTER — Encounter: Payer: Self-pay | Admitting: Family Medicine

## 2011-03-18 DIAGNOSIS — E669 Obesity, unspecified: Secondary | ICD-10-CM | POA: Insufficient documentation

## 2011-03-18 DIAGNOSIS — E0590 Thyrotoxicosis, unspecified without thyrotoxic crisis or storm: Secondary | ICD-10-CM | POA: Insufficient documentation

## 2011-03-18 DIAGNOSIS — R609 Edema, unspecified: Secondary | ICD-10-CM | POA: Insufficient documentation

## 2011-03-18 DIAGNOSIS — I10 Essential (primary) hypertension: Secondary | ICD-10-CM | POA: Insufficient documentation

## 2011-03-18 DIAGNOSIS — E119 Type 2 diabetes mellitus without complications: Secondary | ICD-10-CM

## 2011-03-18 DIAGNOSIS — R5383 Other fatigue: Secondary | ICD-10-CM | POA: Insufficient documentation

## 2011-03-18 DIAGNOSIS — R5381 Other malaise: Secondary | ICD-10-CM | POA: Insufficient documentation

## 2011-03-18 DIAGNOSIS — E7800 Pure hypercholesterolemia, unspecified: Secondary | ICD-10-CM | POA: Insufficient documentation

## 2011-03-18 DIAGNOSIS — R112 Nausea with vomiting, unspecified: Secondary | ICD-10-CM | POA: Insufficient documentation

## 2011-03-18 LAB — URINALYSIS, ROUTINE W REFLEX MICROSCOPIC
Bilirubin Urine: NEGATIVE
Glucose, UA: NEGATIVE mg/dL
Hgb urine dipstick: NEGATIVE
Ketones, ur: NEGATIVE mg/dL
Leukocytes, UA: NEGATIVE
Nitrite: NEGATIVE
Protein, ur: NEGATIVE mg/dL
Specific Gravity, Urine: 1.014 (ref 1.005–1.030)
Urobilinogen, UA: 0.2 mg/dL (ref 0.0–1.0)
pH: 6.5 (ref 5.0–8.0)

## 2011-03-18 LAB — POCT I-STAT, CHEM 8
BUN: 13 mg/dL (ref 6–23)
Calcium, Ion: 1.25 mmol/L (ref 1.12–1.32)
Chloride: 99 mEq/L (ref 96–112)
Creatinine, Ser: 0.8 mg/dL (ref 0.50–1.10)
Glucose, Bld: 210 mg/dL — ABNORMAL HIGH (ref 70–99)
HCT: 39 % (ref 36.0–46.0)
Hemoglobin: 13.3 g/dL (ref 12.0–15.0)
Potassium: 3.5 mEq/L (ref 3.5–5.1)
Sodium: 140 mEq/L (ref 135–145)
TCO2: 32 mmol/L (ref 0–100)

## 2011-03-18 LAB — DIFFERENTIAL
Basophils Absolute: 0 10*3/uL (ref 0.0–0.1)
Basophils Relative: 0 % (ref 0–1)
Eosinophils Absolute: 0 10*3/uL (ref 0.0–0.7)
Eosinophils Relative: 0 % (ref 0–5)
Lymphocytes Relative: 20 % (ref 12–46)
Lymphs Abs: 2.1 10*3/uL (ref 0.7–4.0)
Monocytes Absolute: 0.7 10*3/uL (ref 0.1–1.0)
Monocytes Relative: 7 % (ref 3–12)
Neutro Abs: 7.3 10*3/uL (ref 1.7–7.7)
Neutrophils Relative %: 72 % (ref 43–77)

## 2011-03-18 LAB — CBC
HCT: 37.3 % (ref 36.0–46.0)
Hemoglobin: 12.2 g/dL (ref 12.0–15.0)
MCH: 26.9 pg (ref 26.0–34.0)
MCHC: 32.7 g/dL (ref 30.0–36.0)
MCV: 82.2 fL (ref 78.0–100.0)
Platelets: 246 10*3/uL (ref 150–400)
RBC: 4.54 MIL/uL (ref 3.87–5.11)
RDW: 14.6 % (ref 11.5–15.5)
WBC: 10.1 10*3/uL (ref 4.0–10.5)

## 2011-03-18 NOTE — Telephone Encounter (Signed)
Ms. Justiniano was here today and had gone to the ER yesterday.  Her Blood Sugars have been running high and now she is feeling very sick on her stomach and has broken out in a sweat.  She got a 1/2 Salad from [facility] and ate it and when she started feeling bad, she checked her Blood Sugar and it is 155.

## 2011-03-18 NOTE — Telephone Encounter (Signed)
Rec'd message.  Called patient at home.  She becomes tearful on the phone and says she feels "sick to my stomach".  Feet and hands are starting to swell and she is sweating excessively.  Denies any vomiting, chest pain, or abdominal pain.  She states that she felt this way yesterday and has felt this way in the past when she had a "blood infection".  Patient asking me to direct admit her to the hospital.  I told patient that she will need to go to ED first.  I advised patient to take temperature at home and if T >101.5 and continues to feel sick in one hour, to report to ED.  She states that she cannot wait that long.  Patient agrees to go to ED now.

## 2011-03-18 NOTE — Telephone Encounter (Signed)
Dr. Tye Savoy called patient back and advised she go to ED.

## 2011-03-18 NOTE — Telephone Encounter (Signed)
Complaints are nausea, headache and LE swelling.  Told her I would have to talk with Dr. Tye Savoy and would call her back.

## 2011-03-19 ENCOUNTER — Ambulatory Visit: Payer: 59

## 2011-03-19 ENCOUNTER — Emergency Department (HOSPITAL_COMMUNITY): Payer: Medicare PPO

## 2011-03-19 LAB — GLUCOSE, CAPILLARY: Glucose-Capillary: 189 mg/dL — ABNORMAL HIGH (ref 70–99)

## 2011-03-21 NOTE — Patient Instructions (Signed)
It was nice to meet you. Your sugars have been running high. Continue to take Lantus 90 units at bedtime. Please take your blood glucose with meals.  If it is over >170, please take 5 units of Novolog. Please follow up in 1 month to repeat A1c. Continue to log your CBGS daily and bring with you to clinic. Thanks.

## 2011-03-21 NOTE — Assessment & Plan Note (Signed)
Patient's CBGs according to log have been 190s - 320s.  Advised patient to follow Dr. Melvia Heaps advice and start taking Novolog 5 units once a day with meals if BG > 170.  Patient is reluctant and would rather exercise and avoid carbs in order to better control DM.   Most recent A1C on 6/27 was 9.8.  Told patient that she will feel better once her DM is better controlled.  She agreed. If patient continues to experience nausea, may consider abdominal U/S or GI consult for EGD. Patient will return in 1-2 months to repeat A1C.

## 2011-03-21 NOTE — Progress Notes (Signed)
  Subjective:    Patient ID: Kaitlyn Ferguson, female    DOB: Mar 19, 1946, 65 y.o.   MRN: [id number]  HPI  Patient presents to clinic today after being seen in the ED yesterday for nausea.  Patient felt sick to her stomach and c/o excessive sweating yesterday.  She states that she went to a cookout the night before and ate "junk" and candy, which may have been why she felt sick.  Per patient, EDP drew labs and said everything was normal.    Patient takes Lantus 90 units at bedtime.  Per previous note, she was advised by Dr. Karie Schwalbe. to take Novolog 5 units at dinner time if CBG > 170.  Patient has stopped doing this because she becomes hypoglycemic in the am.  CBGs have ranged between 190s to 320s in the last few weeks.  Today, she feels better.  Denies any excessive sweating, nausea/vomiting, chest or abdominal pain, numbness/tingling, diarrhea/constpation, or dysuria.   Review of Systems Per HPI    Objective:   Physical Exam  Constitutional: She is oriented to person, place, and time. She appears well-developed and well-nourished.  HENT:  Head: Normocephalic.  Neck: Neck supple. No JVD present.  Cardiovascular: Normal rate and regular rhythm.  Exam reveals no gallop and no friction rub.   No murmur heard. Pulmonary/Chest: Effort normal and breath sounds normal. She has no wheezes. She has no rales.  Abdominal: Soft. Bowel sounds are normal. She exhibits no distension. There is no tenderness.  Lymphadenopathy:    She has no cervical adenopathy.  Neurological: She is alert and oriented to person, place, and time.  Skin: Skin is warm and dry.          Assessment & Plan:

## 2011-03-22 ENCOUNTER — Telehealth: Payer: Self-pay | Admitting: Family Medicine

## 2011-03-22 NOTE — Telephone Encounter (Signed)
Has had another low for 3 mornings in a row.  She wants to speak to Dr. Tye Savoy about what she needs to do to get them under control.  They are really good at night, but by morning, they are very low.

## 2011-03-26 NOTE — Telephone Encounter (Signed)
Hi teammates, this patient is complicated and I will need more information re: low blood sugars.  I would like patient to return to clinic to discuss diabetes management.  Can you squeeze her in next Tuesday am?  I will likely need to consult Dr. Raymondo Band or one of the attendings at this visit due to  hx of repetitive phone calls and multiple ED visits.  Thank you!

## 2011-03-27 ENCOUNTER — Ambulatory Visit: Payer: 59

## 2011-03-27 NOTE — Telephone Encounter (Signed)
Niel Hummer with patient and she stated that she has decreased insulin on her own and is not wanting to come in due to her sugars now being under control. I did explain to patient that it would be in her best interest to come in for this appointment and once again she declined visit. I was wanting you to know about this due to her condition. ---[place] Dec

## 2011-03-28 NOTE — Telephone Encounter (Signed)
Kaitlyn Ferguson, that's fine.  The patient can return to clinic in mid-September.  We will repeat an A1c at that time.

## 2011-04-01 ENCOUNTER — Ambulatory Visit: Payer: 59

## 2011-04-04 ENCOUNTER — Other Ambulatory Visit: Payer: Self-pay | Admitting: Family Medicine

## 2011-04-04 NOTE — Telephone Encounter (Signed)
Kaitlyn Ferguson is calling for a refill on her pain medication, Vicodin.  Please call her when it is ready to be picked up.

## 2011-04-23 NOTE — Telephone Encounter (Signed)
Hey team, because this a new patient I inherited, I will need to see patient before refilling controlled substance.  Please squeeze her in next week.  Thanks.

## 2011-04-23 NOTE — Telephone Encounter (Signed)
Has this been addressed yet?

## 2011-04-24 NOTE — Telephone Encounter (Signed)
LVM for patient to call back to inform of below and have an appointment scheduled for her

## 2011-04-25 ENCOUNTER — Telehealth: Payer: Self-pay | Admitting: Family Medicine

## 2011-04-25 NOTE — Telephone Encounter (Signed)
LVM for patient to call

## 2011-04-25 NOTE — Telephone Encounter (Signed)
Pt scheduled for 9/7 @ 2:00 per Dr. Tye Savoy

## 2011-04-25 NOTE — Telephone Encounter (Signed)
lvm and asked pt to call office to set an appt with new pcp.Kaitlyn Ferguson [place]

## 2011-04-26 NOTE — Telephone Encounter (Signed)
Patient has an appointment on 9/7 with Dr. Tye Savoy.

## 2011-05-03 ENCOUNTER — Ambulatory Visit: Payer: Medicare PPO | Admitting: Family Medicine

## 2011-05-03 ENCOUNTER — Ambulatory Visit
Admission: RE | Admit: 2011-05-03 | Discharge: 2011-05-03 | Disposition: A | Discharge status: Home / Self Care | Payer: Medicare PPO | Source: Ambulatory Visit | Attending: Internal Medicine | Admitting: Internal Medicine

## 2011-05-03 DIAGNOSIS — Z1231 Encounter for screening mammogram for malignant neoplasm of breast: Secondary | ICD-10-CM

## 2011-05-10 ENCOUNTER — Encounter: Payer: Self-pay | Admitting: Family Medicine

## 2011-05-10 ENCOUNTER — Ambulatory Visit (INDEPENDENT_AMBULATORY_CARE_PROVIDER_SITE_OTHER): Payer: Medicare PPO | Admitting: Family Medicine

## 2011-05-10 VITALS — BP 150/79 | HR 62 | Temp 98.2°F | Ht 66.5 in | Wt 262.3 lb

## 2011-05-10 DIAGNOSIS — E119 Type 2 diabetes mellitus without complications: Secondary | ICD-10-CM

## 2011-05-10 DIAGNOSIS — M5412 Radiculopathy, cervical region: Secondary | ICD-10-CM

## 2011-05-10 DIAGNOSIS — Z23 Encounter for immunization: Secondary | ICD-10-CM

## 2011-05-10 LAB — POCT GLYCOSYLATED HEMOGLOBIN (HGB A1C): Hemoglobin A1C: 8.4

## 2011-05-10 MED ORDER — ACETAMINOPHEN 500 MG PO TABS: 500.0000 mg | ORAL_TABLET | Freq: Three times a day (TID) | ORAL | Status: AC | PRN

## 2011-05-10 MED ORDER — GABAPENTIN 800 MG PO TABS: 800.0000 mg | ORAL_TABLET | Freq: Two times a day (BID) | ORAL | Status: DC

## 2011-05-10 NOTE — Patient Instructions (Signed)
Your A1c from 9.8 to 8.4.  Keep up the good work. Continue to exercise and eat a well-balanced diet.  You are on the right track! You can decrease your Gabapentin to one tablet in am and one tablet at night. Please take Tylenol 500 mg every 8 hrs as needed for severe pain. Please schedule a follow up appointment with me in 3 months. Great to see you again, Dr. Tye Savoy

## 2011-05-15 NOTE — Assessment & Plan Note (Signed)
Alc down from 9.8 to 8.4. Encouraged patient to continue to exercise and eat CHO-modified diet. Will continue Lantus 68 units at bedtime.  Advised patient to continue this regimen daily and to not self-adjust. Explained the difference between long-acting vs. Short-acting insulin. Patient agreed/understood plan. Follow up in 3 months - bring log book.

## 2011-05-15 NOTE — Assessment & Plan Note (Signed)
Presents as shoulder/arm pain. Was taking Gabapentin 800 mg 2 tab in am and 2 at bedtime. This made patient nauseated. Advised patient to cut back to 1 in am and 1 at bedtime. Take Tylenol 500 mg as needed for associated shoulder pain. Follow up in 3 months.

## 2011-05-15 NOTE — Progress Notes (Signed)
  Subjective:    Patient ID: Kaitlyn Ferguson, female    DOB: Jan 18, 1946, 65 y.o.   MRN: [id number]  HPI  Patient presents to clinic for diabetes follow up and neck/shoulder pain.  DM, type 2: patient takes Lantus 68 units at bedtime.  Per previous PCP note, she was supposed to be taking Lantus 60 units at bedtime.  Patient states that ever since she took the incorrect dose of Novolog, she has stopped using short-acting insulin and adjusts her Lantus on her own based on CBGs.  Per patient, CBGs run low in am (60s) and high in evening (200s).  She is asymptomatic when sugars are low in am.  Denies any sweating, nausea/vomit, lightheadedness, dizziness, headache, numbness/tingling of extremities.  Neck/shoulder pain and radiculopathy: chronic issue, patient was seen by previous PCP who started Gabapentin for radiculopathy.  Radicular symptoms improve with Gabapentin.  Patient has been taking 2 tablets in am and at bedtime.  Says this makes her drowsy and nauseated.  Takes Aleve for associated shooting pains This helps bring pain from 10 to 4.  Pain is intermittent.  Located bilateral shoulders and radiates to bilateral arms.  Review of Systems  Per HPI    Objective:   Physical Exam  General: pleasant, in no distress Heart: RRR Lungs: CTAB MSK: neck - full ROM, cervical vertebra non-tender, but paraspinal muscles tender with palpation; shoulders - full ROM bilaterally, deltoid muscles tender and tight; no evidence of rotator cuff injury       Assessment & Plan:

## 2011-06-20 ENCOUNTER — Encounter: Payer: Self-pay | Admitting: Family Medicine

## 2011-06-20 ENCOUNTER — Ambulatory Visit (INDEPENDENT_AMBULATORY_CARE_PROVIDER_SITE_OTHER): Payer: Medicare PPO | Admitting: Family Medicine

## 2011-06-20 VITALS — BP 130/77 | HR 70 | Ht 66.5 in | Wt 254.0 lb

## 2011-06-20 DIAGNOSIS — R110 Nausea: Secondary | ICD-10-CM | POA: Insufficient documentation

## 2011-06-20 DIAGNOSIS — R011 Cardiac murmur, unspecified: Secondary | ICD-10-CM

## 2011-06-20 MED ORDER — OMEPRAZOLE 40 MG PO CPDR: 40.0000 mg | DELAYED_RELEASE_CAPSULE | Freq: Every day | ORAL | Status: DC

## 2011-06-20 NOTE — Assessment & Plan Note (Signed)
Appears to be benign. Plan to follow this up with her PCP

## 2011-06-20 NOTE — Assessment & Plan Note (Signed)
I'm unsure of this etiology. This may be gastritis.  However this is a long process and she is no acute findings today. Plan to apparently treat for reflux with omeprazole. Patient will follow up with PCP in one month and address this issue with Dr. Tye Savoy. Discussed warning signs, the patient expresses understanding.

## 2011-06-20 NOTE — Progress Notes (Signed)
Kaitlyn Ferguson presents to clinic today for belching nausea and mild abdominal pain for 3-4 months.  She notes that she used to have nausea bloating and belching in the evenings. However recently she's had symptoms in the morning 2. In her past she's had a gallbladder surgery a year ago. Additionally she had a colonoscopy Tuesday with a colon prep.  She's having bowel movements frequently following her colonoscopy, however she is usually constipated with a bowel movement weekly.  She does note she has some substernal pressure preceding her belches. She exercises regularly and this pressure is never exertional. She denies any palpitations. She denies any vomiting melena hematochezia or hematemesis. She feels well otherwise.  PMH reviewed.  ROS as above otherwise neg Medications reviewed.  Exam:  BP 130/77  Pulse 70  Ht 5' 6.5" (1.689 m)  Wt 254 lb (115.214 kg)  BMI 40.38 kg/m2 Gen: Well NAD HEENT: EOMI,  MMM Lungs: CTABL Nl WOB Heart: RRR no soft 2/6 nonradiating systolic murmur Abd: NABS, NT, ND, well-healed midline scar. Negative Murphy sign.  Exts: Non edematous BL  LE, warm and well perfused.

## 2011-06-20 NOTE — Patient Instructions (Signed)
Thank you for coming in today. I am not sure why you're having a symptoms. Try to avoid dairy products. Also take omeprazole daily. If this doesn't help I like you to see Dr. Tye Savoy in November. You have already had surgery for gallbladder, and this is not appear to be dangerous right now.

## 2011-06-22 ENCOUNTER — Other Ambulatory Visit: Payer: Self-pay | Admitting: Sports Medicine

## 2011-06-24 NOTE — Telephone Encounter (Signed)
Refill request

## 2011-07-26 ENCOUNTER — Ambulatory Visit (INDEPENDENT_AMBULATORY_CARE_PROVIDER_SITE_OTHER): Payer: Medicare PPO | Admitting: Family Medicine

## 2011-07-26 ENCOUNTER — Encounter: Payer: Self-pay | Admitting: Family Medicine

## 2011-07-26 VITALS — BP 110/60 | HR 68 | Temp 98.7°F | Ht 66.0 in | Wt 245.0 lb

## 2011-07-26 DIAGNOSIS — B9789 Other viral agents as the cause of diseases classified elsewhere: Secondary | ICD-10-CM

## 2011-07-26 DIAGNOSIS — Z23 Encounter for immunization: Secondary | ICD-10-CM

## 2011-07-26 DIAGNOSIS — B349 Viral infection, unspecified: Secondary | ICD-10-CM

## 2011-07-26 NOTE — Progress Notes (Signed)
  Subjective:    Patient ID: Kaitlyn Ferguson, female    DOB: 23-Aug-1946, 65 y.o.   MRN: [id number]  HPI Flulike symptoms x1 week. Positive generalized malaise, diffuse body aches, nasal congestion, rhinorrhea, sore throat, cough, and headache. Mild weakness. No known sick contacts. Pt is up to date on flu shot. Pt has been using aleve with minimal relief in sxs. Pt states that she has been drinking fluids. Pt is a non smoker.    Review of Systems See HPI, otherwise 12 point ROS negative    Objective:   Physical Exam Gen: up in chair, mildly ill appearing HEENT: NCAT, EOMI, + nasal erythema bilaterally, +post oropharyngeal erythema, TMs clear bilaterally  CV: RRR, no murmurs auscultated PULM: CTAB, no wheezes, rales, rhoncii ABD: S/NT/+ bowel sounds  EXT: 2+ peripheral pulses   Assessment & Plan:

## 2011-07-26 NOTE — Patient Instructions (Signed)
Viral Infections A virus is a type of germ. Viruses can cause:  Minor sore throats.   Aches and pains.   Headaches.   Runny nose.   Rashes.   Watery eyes.   Tiredness.   Coughs.   Loss of appetite.   Feeling sick to your stomach (nausea).   Throwing up (vomiting).   Watery poop (diarrhea).  HOME CARE   Only take medicines as told by your doctor.   Drink enough water and fluids to keep your pee (urine) clear or pale yellow. Sports drinks are a good choice.   Get plenty of rest and eat healthy. Soups and broths with crackers or rice are fine.  GET HELP RIGHT AWAY IF:   You have a very bad headache.   You have shortness of breath.   You have chest pain or neck pain.   You have an unusual rash.   You cannot stop throwing up.   You have watery poop that does not stop.   You cannot keep fluids down.   You or your child has a temperature by mouth above 102 F (38.9 C), not controlled by medicine.   Your baby is older than 3 months with a rectal temperature of 102 F (38.9 C) or higher.   Your baby is 40 months old or younger with a rectal temperature of 100.4 F (38 C) or higher.  MAKE SURE YOU:   Understand these instructions.   Will watch this condition.   Will get help right away if you are not doing well or get worse.  Document Released: 07/25/2008 Document Revised: 04/24/2011 Document Reviewed: 12/18/2010 [facility] Patient Information 2012 [place], [place].

## 2011-07-26 NOTE — Assessment & Plan Note (Addendum)
Flu like viral illness. Discussed supportive care, adequate hydration, as well as red flags for reevaluation. Handout given. Pt agreeable to plan.

## 2011-08-12 ENCOUNTER — Encounter: Payer: Self-pay | Admitting: Family Medicine

## 2011-08-12 ENCOUNTER — Ambulatory Visit (INDEPENDENT_AMBULATORY_CARE_PROVIDER_SITE_OTHER): Payer: Medicare PPO | Admitting: Family Medicine

## 2011-08-12 VITALS — BP 126/70 | HR 94 | Temp 98.7°F | Ht 66.6 in | Wt 253.4 lb

## 2011-08-12 DIAGNOSIS — R5381 Other malaise: Secondary | ICD-10-CM

## 2011-08-12 DIAGNOSIS — K219 Gastro-esophageal reflux disease without esophagitis: Secondary | ICD-10-CM

## 2011-08-12 DIAGNOSIS — E119 Type 2 diabetes mellitus without complications: Secondary | ICD-10-CM

## 2011-08-12 DIAGNOSIS — R5383 Other fatigue: Secondary | ICD-10-CM

## 2011-08-12 LAB — POCT GLYCOSYLATED HEMOGLOBIN (HGB A1C): Hemoglobin A1C: 8.9

## 2011-08-12 MED ORDER — INSULIN GLARGINE 100 UNIT/ML ~~LOC~~ SOLN: 72.0000 [IU] | Freq: Every day | SUBCUTANEOUS | Status: DC

## 2011-08-12 NOTE — Patient Instructions (Signed)
It was nice to see today Kaitlyn Ferguson. Your A1c has bumped from 8.4-8.9. Increase Lantus to 72 units daily.  If you experience any nausea, vomiting, excessive sweating, dizziness-please call MD or go to ER. Schedule followup appointment with me in 3 months. Also schedule an appointment with Dr. Wyona Almas, diabetes and nutrition education.

## 2011-08-12 NOTE — Progress Notes (Signed)
  Subjective:    Patient ID: Kaitlyn Ferguson, female    DOB: March 22, 1946, 65 y.o.   MRN: [id number]  HPI Patient presents to clinic with multiple complaints. First, patient states that she is tired all the time. She takes naps throughout the day. Patient typically goes to bed around 1:00 in the morning and then wakes up at 4:00 in the morning. Then she will take a nap in the afternoon. Patient goes to the [facility] to lift weights and says she is tired throughout workout.  Diabetes: Patient is due for A1c today. Current regimen includes Lantus 60 units subcutaneous daily. She does not wish to take any short acting insulin because she had one episode of overdosing in the past. Patient does not adhere to a carb modified diet. She continues to eat sweets and desserts. She does check her blood sugars at home and says they have been running high. Denies any dizziness, excessive sweating, headache, abdominal pain, numbness or tingling in her extremities.  GERD: Patient was seen by Dr. Denyse Amass about 2 months ago for nausea. She was given a prescription for omeprazole which has helped relieve the nausea and reflux. Patient wants to make sure she can take Gas-X and TUMS in addition to omeprazole.  Review of Systems Per history of present illness    Objective:   Physical Exam  Constitutional: No distress.  HENT:  Mouth/Throat: Oropharynx is clear and moist.  Neck: Neck supple.  Cardiovascular: Normal rate and regular rhythm.   Murmur heard. Pulmonary/Chest: Effort normal. She has no wheezes. She has no rales.  Abdominal: Soft. She exhibits no distension. There is no tenderness.  Musculoskeletal: She exhibits no edema and no tenderness.  Skin: No rash noted. No erythema.          Assessment & Plan:

## 2011-08-13 DIAGNOSIS — R5383 Other fatigue: Secondary | ICD-10-CM | POA: Insufficient documentation

## 2011-08-13 DIAGNOSIS — K219 Gastro-esophageal reflux disease without esophagitis: Secondary | ICD-10-CM | POA: Insufficient documentation

## 2011-08-13 LAB — TSH: TSH: 0.955 u[IU]/mL (ref 0.350–4.500)

## 2011-08-13 NOTE — Assessment & Plan Note (Signed)
Uncontrolled - will increase Lantus to 72 units daily.  Will refer to Dr. Raymondo Band in 3 months if Alc still elevated.  Patient needs to overcome fear of short acting insulin.  Patient interested in meeting with Dr. Gerilyn Pilgrim at this time.

## 2011-08-13 NOTE — Assessment & Plan Note (Signed)
Better controlled.  Continue Omeprazole.

## 2011-08-13 NOTE — Assessment & Plan Note (Signed)
Patient not interested in medications at this time.  Advised to stop napping during the day.  Limit screen time before bed and try drinking "sleepy" tea.  Will recheck TSH today.

## 2011-08-14 ENCOUNTER — Other Ambulatory Visit: Payer: Self-pay | Admitting: Family Medicine

## 2011-08-14 NOTE — Telephone Encounter (Signed)
Refill request

## 2011-08-22 ENCOUNTER — Other Ambulatory Visit: Payer: Self-pay | Admitting: Family Medicine

## 2011-08-22 NOTE — Telephone Encounter (Signed)
Refill request

## 2011-09-13 ENCOUNTER — Ambulatory Visit: Payer: Medicare PPO | Admitting: Family Medicine

## 2011-09-18 ENCOUNTER — Ambulatory Visit: Payer: Medicare PPO | Admitting: Family Medicine

## 2011-09-20 ENCOUNTER — Telehealth: Payer: Self-pay | Admitting: Family Medicine

## 2011-09-20 NOTE — Telephone Encounter (Signed)
Called pharmacy and they do now see the directions on the RX that were sent in in December.  They will give pen. Called and notified patient . She wants to know if pharmacy will show her how to use the pen . Advised to ask when she picks up. If not she will call me Monday AM 01/28 and will come in here Monday for instruction. Pharmacy should be able to show her how to use.

## 2011-09-20 NOTE — Telephone Encounter (Signed)
The pharmacy did not give her the Lantus pen which is what they were supposed to give her, they gave her the vials and she needs this corrected.  Give her a call when this is done.

## 2011-09-29 ENCOUNTER — Other Ambulatory Visit: Payer: Self-pay | Admitting: Sports Medicine

## 2011-09-29 NOTE — Telephone Encounter (Signed)
Refill request

## 2011-10-18 ENCOUNTER — Other Ambulatory Visit: Payer: Self-pay | Admitting: Family Medicine

## 2011-10-18 NOTE — Telephone Encounter (Signed)
Refill request

## 2011-10-23 ENCOUNTER — Ambulatory Visit (INDEPENDENT_AMBULATORY_CARE_PROVIDER_SITE_OTHER): Payer: Medicare Other | Admitting: Family Medicine

## 2011-10-23 ENCOUNTER — Encounter: Payer: Self-pay | Admitting: Family Medicine

## 2011-10-23 VITALS — BP 136/74 | HR 78 | Temp 98.4°F | Ht 66.0 in | Wt 257.0 lb

## 2011-10-23 DIAGNOSIS — M5432 Sciatica, left side: Secondary | ICD-10-CM

## 2011-10-23 DIAGNOSIS — E119 Type 2 diabetes mellitus without complications: Secondary | ICD-10-CM

## 2011-10-23 DIAGNOSIS — M5430 Sciatica, unspecified side: Secondary | ICD-10-CM

## 2011-10-23 MED ORDER — HYDROCODONE-ACETAMINOPHEN 5-500 MG PO TABS: 1.0000 | ORAL_TABLET | Freq: Four times a day (QID) | ORAL | Status: AC | PRN

## 2011-10-23 NOTE — Patient Instructions (Signed)

## 2011-10-23 NOTE — Assessment & Plan Note (Signed)
Sciatica x 1 day with no evidence of spinal cord impingement.  Will rx vicodin for symptomatic relief, may continue to use motrin as well.  Given red flags for return for re-evaluation.

## 2011-10-23 NOTE — Progress Notes (Signed)
  Subjective:    Patient ID: Kaitlyn Ferguson, female    DOB: 06/26/1946, 66 y.o.   MRN: [id number]  HPI work in appt for acute back pain  1 day of back pain.  Low back pain, 10/10 sharp pain, then steady pain.  Radiates down left back of leg leg to ankle.  No new activity or injury.  Is active and has been doing her regular exercise ball cardio and exercise machines prior to pain and eager to get back to exercises.  No numbess or tingling or weakness.  No changes in bowel or bladder control.    Has been taking motrin with little help.  No fever, chills.  PMH sig for back surgery for ruptured disk in 1980's.  No sig chronic back pain  Review of Systemssee hpi     Objective:   Physical Exam GEN: Alert & Oriented, No acute distress CV:  Regular Rate & Rhythm, no murmur Respiratory:  Normal work of breathing, CTAB Back:  No pain to palpation of spine.  Minimal paraspinal tenderness.  Mild pain in lumbar/ sacral sp ine. Neg straight leg raise.  Thigh and leg strength 5/5 bilaterally.  Patella reflexes equal bilaterally.        Assessment & Plan:

## 2011-11-06 ENCOUNTER — Encounter: Payer: Self-pay | Admitting: Family Medicine

## 2011-11-06 ENCOUNTER — Ambulatory Visit (INDEPENDENT_AMBULATORY_CARE_PROVIDER_SITE_OTHER): Payer: Medicare Other | Admitting: Family Medicine

## 2011-11-06 VITALS — BP 126/70 | Temp 98.2°F | Ht 66.0 in | Wt 256.0 lb

## 2011-11-06 DIAGNOSIS — E119 Type 2 diabetes mellitus without complications: Secondary | ICD-10-CM

## 2011-11-06 LAB — POCT UA - MICROALBUMIN
Creatinine, POC: 50 mg/dL
Microalbumin Ur, POC: 10 mg/dL

## 2011-11-06 LAB — POCT GLYCOSYLATED HEMOGLOBIN (HGB A1C): Hemoglobin A1C: 9

## 2011-11-06 NOTE — Progress Notes (Signed)
  Subjective:    Patient ID: Kaitlyn Ferguson, female    DOB: Nov 22, 1945, 66 y.o.   MRN: [id number]  HPI  Patient presents to clinic for DM follow up.  Last A1c was 8.9 and she is due for another one today.  Patient currently takes Lantus 72 units daily.  She records CBGs but forgot to bring it in today.  She remembers that her lowest was 69 and highest 395.  Says that sugars typically range 120-230.  CBGs are highest in the evening before she gives herself Lantus at bedtime.  She denies any nausea/vomiting, headache, blurry vision, diabetic ulcers, dizziness/weakness.  Patient has a good appetite but still eats unhealthy foods.  She does work out 4 days a week - goes to aerobic and weight classes.  Of note, patient was once taking Novolog but overdosed one time (giving herself Novolog 60+ units which was her Lantus dose).  She was fine afterwards but since then has not wanted to go back on short acting insulin.  I have reviewed PMH, SH, Problem List, Allergies/Meds.  Review of Systems  Per HPI    Objective:   Physical Exam  Constitutional: She appears well-nourished. No distress.  HENT:  Head: Normocephalic and atraumatic.  Cardiovascular: Normal rate and regular rhythm.   No murmur heard. Pulmonary/Chest: Effort normal and breath sounds normal. She has no wheezes. She has no rales.  Abdominal: Soft. Bowel sounds are normal. She exhibits no distension.  Neurological: She is alert.  Skin:       Callous on right foot between toes, but no open wounds or ulcers.  Normal sensation.  Normal pin-prick exam.  Full ROM bilateral LE.          Assessment & Plan:

## 2011-11-06 NOTE — Assessment & Plan Note (Signed)
A1c has been rising from 8.4 to 8.9 (December 2012) to 9.0 (today). Referral to [facility] w/ Dr. Raymondo Band. Patient reluctant to starting Novolog because she overdosed one time (she mixed up her long acting and short acting and accidentally gave herself 60+ units of Novolog at one time). She may benefit from another agent.  Appreciate Dr. Macky Lower help.  Will follow up recommendations. Return to PCP in 3 months.  Continue Lantus 72 units for now.

## 2011-11-06 NOTE — Patient Instructions (Signed)
It was great to see you today. Your A1c today: 9.0 Please schedule an appointment with Dr. Raymondo Band at pharmacy clinic.  Continue to exercise 4 days per week and try to increase to 5 days if you can. Try to eat a low carb diet and eat only sugar-free desserts. Purchase multi-vitamins and Vitamin B complex and take daily.  Schedule follow up appointment with Dr. Tye Savoy in 3 months or sooner if needed.

## 2011-11-18 ENCOUNTER — Emergency Department (HOSPITAL_COMMUNITY)
Admission: EM | Admit: 2011-11-18 | Discharge: 2011-11-18 | Disposition: A | Discharge status: Home / Self Care | Payer: Medicare Other | Attending: Emergency Medicine | Admitting: Emergency Medicine

## 2011-11-18 ENCOUNTER — Other Ambulatory Visit: Payer: Self-pay

## 2011-11-18 ENCOUNTER — Emergency Department (HOSPITAL_COMMUNITY): Payer: Medicare Other

## 2011-11-18 ENCOUNTER — Encounter (HOSPITAL_COMMUNITY): Payer: Self-pay | Admitting: Neurology

## 2011-11-18 DIAGNOSIS — Z794 Long term (current) use of insulin: Secondary | ICD-10-CM | POA: Insufficient documentation

## 2011-11-18 DIAGNOSIS — I10 Essential (primary) hypertension: Secondary | ICD-10-CM | POA: Insufficient documentation

## 2011-11-18 DIAGNOSIS — R079 Chest pain, unspecified: Secondary | ICD-10-CM | POA: Insufficient documentation

## 2011-11-18 DIAGNOSIS — E876 Hypokalemia: Secondary | ICD-10-CM

## 2011-11-18 DIAGNOSIS — E119 Type 2 diabetes mellitus without complications: Secondary | ICD-10-CM | POA: Insufficient documentation

## 2011-11-18 DIAGNOSIS — E039 Hypothyroidism, unspecified: Secondary | ICD-10-CM | POA: Insufficient documentation

## 2011-11-18 DIAGNOSIS — Z79899 Other long term (current) drug therapy: Secondary | ICD-10-CM | POA: Insufficient documentation

## 2011-11-18 HISTORY — DX: Essential (primary) hypertension: I10

## 2011-11-18 LAB — COMPREHENSIVE METABOLIC PANEL
ALT: 15 U/L (ref 0–35)
AST: 16 U/L (ref 0–37)
Albumin: 3.6 g/dL (ref 3.5–5.2)
Alkaline Phosphatase: 90 U/L (ref 39–117)
BUN: 16 mg/dL (ref 6–23)
CO2: 32 mEq/L (ref 19–32)
Calcium: 9.5 mg/dL (ref 8.4–10.5)
Chloride: 97 mEq/L (ref 96–112)
Creatinine, Ser: 0.78 mg/dL (ref 0.50–1.10)
GFR calc Af Amer: >90 mL/min (ref >90)
GFR calc non Af Amer: 86 mL/min — ABNORMAL LOW (ref >90)
Glucose, Bld: 176 mg/dL — ABNORMAL HIGH (ref 70–99)
Potassium: 3.2 mEq/L — ABNORMAL LOW (ref 3.5–5.1)
Sodium: 138 mEq/L (ref 135–145)
Total Bilirubin: 0.2 mg/dL — ABNORMAL LOW (ref 0.3–1.2)
Total Protein: 6.7 g/dL (ref 6.0–8.3)

## 2011-11-18 LAB — TROPONIN I: Troponin I: <0.3 ng/mL (ref <0.30)

## 2011-11-18 LAB — CBC
HCT: 37.6 % (ref 36.0–46.0)
Hemoglobin: 11.7 g/dL — ABNORMAL LOW (ref 12.0–15.0)
MCH: 25.7 pg — ABNORMAL LOW (ref 26.0–34.0)
MCHC: 31.1 g/dL (ref 30.0–36.0)
MCV: 82.5 fL (ref 78.0–100.0)
Platelets: 278 10*3/uL (ref 150–400)
RBC: 4.56 MIL/uL (ref 3.87–5.11)
RDW: 14.3 % (ref 11.5–15.5)
WBC: 8.7 10*3/uL (ref 4.0–10.5)

## 2011-11-18 MED ORDER — POTASSIUM CHLORIDE CRYS ER 20 MEQ PO TBCR
40.0000 meq | EXTENDED_RELEASE_TABLET | Freq: Once | ORAL | Status: AC
  Administered 2011-11-18: 40 meq via ORAL
  Filled 2011-11-18: qty 2

## 2011-11-18 MED ORDER — POTASSIUM CHLORIDE CRYS ER 20 MEQ PO TBCR: EXTENDED_RELEASE_TABLET | ORAL | Status: DC

## 2011-11-18 NOTE — ED Notes (Addendum)
Patietn states she started having chest pain between her breast and radiating to her back yesterday. Patient states she was still having pain this morning and she went to the gym and worked out and afterwards she checked her BP and it was 165/100. Patient states she had pain radiating to her right arm this morning.  Patient denies chest pain at this time and denies N/V. Patient states pain not reproducible with palpation.  Patient placed on monitor and 3L oxygen with sats of 98%.

## 2011-11-18 NOTE — ED Notes (Addendum)
Per ems- pt reports cp developed last night, no relief. Today cp radiation to back and left arm. BP initially 170/110 after 2 nitro and 324 aspirin 0/10 cp.  127/83 after nitro. Pt reporting working out this morning. Abnormal EKG, need follow up, difficult to read. Alert and oriented. NAD. Speaking in clear, concise sentences. Denying SOB. Skin warm and dry. Denying any dizziness. EKG strips showed to EDP

## 2011-11-18 NOTE — ED Provider Notes (Signed)
History     CSN: [id number]  Arrival date & time 11/18/11  1347   First MD Initiated Contact with Patient 11/18/11 1351      Chief Complaint  Patient presents with  . Chest Pain    (Consider location/radiation/quality/duration/timing/severity/associated sxs/prior treatment) Patient is a 66 y.o. female presenting with chest pain. The history is provided by the patient.  Chest Pain Pertinent negatives for primary symptoms include no fever, no shortness of breath and no abdominal pain.   pt c/o pain to lower sternal/xiphoid area in midline since last evening. Constant. States if 'moves a certain way', or changes position, or breaths/coughs a certain way the same pain will worsen for a few seconds. No exertional cp or discomfort. No assoc nv, sob or diaphoresis. Pain is not pleuritic. No leg pain or swelling. ? Hx gerd. No fam hx premature cad. Pt reports prior neg cath. No recent unusual doe or fatigue. No persistent cough, sore throat, fever or uri c/o. No chest wall trauma or strain.   Past Medical History  Diagnosis Date  . Arthritis   . Diabetes mellitus   . Cholelithiasis   . Hyperlipidemia   . Hypothyroidism   . Obesity   . DJD (degenerative joint disease) 10/07/2008  . Osteomyelitis of ankle     Complications of Right ankle injury  . DJD (degenerative joint disease)   . Hypertension   . Neuropathy     Past Surgical History  Procedure Date  . Abdominal hysterectomy   . Spine surgery   . Tonsillectomy     Childhood  . Neck mass excision 12/29/2001  . Breast biopsy 05/1997    Right breast (fatty necrosis-no malignancy)  . Ankle fusion 02/1996    Right ankle tib/talar  . Carpal tunnel release 1993    Right arm    Family History  Problem Relation Age of Onset  . Diabetes Brother   . Diabetes Sister     DM2-lost weight-now okay  . Heart attack Father     First MI- early 77's  . Stroke Mother   . Heart failure Mother   . Multiple myeloma Mother     Deceased  at 49 due to same    History  Substance Use Topics  . Smoking status: Never Smoker   . Smokeless tobacco: Not on file   Comment: No tobacco currently  . Alcohol Use: No    OB History    Grav Para Term Preterm Abortions TAB SAB Ect Mult Living                  Review of Systems  Constitutional: Negative for fever and chills.  HENT: Negative for neck pain.   Eyes: Negative for redness.  Respiratory: Negative for shortness of breath.   Cardiovascular: Positive for chest pain. Negative for leg swelling.  Gastrointestinal: Negative for abdominal pain.  Genitourinary: Negative for flank pain.  Musculoskeletal: Negative for back pain.  Skin: Negative for rash.  Neurological: Negative for headaches.  Hematological: Does not bruise/bleed easily.  Psychiatric/Behavioral: Negative for confusion.    Allergies  Vancomycin  Home Medications   Current Outpatient Rx  Name Route Sig Dispense Refill  . ASPIRIN 81 MG PO TABS Oral Take 81 mg by mouth daily.      . BD LANCET ULTRAFINE 33G MISC  Use as directed 100 each 11  . CRESTOR 20 MG PO TABS  take 1 tablet by mouth at bedtime 90 tablet 3  . GABAPENTIN 800  MG PO TABS Oral Take 1 tablet (800 mg total) by mouth 2 (two) times daily. 120 tablet 3  . INSULIN GLARGINE 100 UNIT/ML [place] SOLN Subcutaneous Inject 72 Units into the skin daily. Please dispense Lantus pen. 3 mL 11  . INSULIN SYRINGE-NEEDLE U-100 31G X 5/16" 1 ML MISC  Use with insulin as directed. 100 each 11  . LEVOTHYROXINE SODIUM 112 MCG PO TABS Oral Take 112 mcg by mouth daily.      Marland Kitchen LISINOPRIL-HYDROCHLOROTHIAZIDE 20-25 MG PO TABS  take 1 tablet by mouth once daily 90 tablet 3  . METOPROLOL TARTRATE 100 MG PO TABS Oral Take 100 mg by mouth 2 (two) times daily.      Marland Kitchen OMEPRAZOLE 40 MG PO CPDR  take 1 capsule by mouth once daily 30 capsule 1  . OMEPRAZOLE 40 MG PO CPDR  take 1 capsule by mouth once daily 30 capsule 1    BP 113/68  Pulse 68  Temp(Src) 97.5 F (36.4 C) (Oral)   Resp 18  SpO2 95%  Physical Exam  Nursing note and vitals reviewed. Constitutional: She appears well-developed and well-nourished. No distress.  HENT:  Head: Atraumatic.  Eyes: Conjunctivae are normal. No scleral icterus.  Neck: Neck supple. No tracheal deviation present.  Cardiovascular: Normal rate, regular rhythm, normal heart sounds and intact distal pulses.  Exam reveals no gallop and no friction rub.   No murmur heard. Pulmonary/Chest: Effort normal and breath sounds normal. No respiratory distress. She exhibits tenderness.  Abdominal: Soft. Normal appearance and bowel sounds are normal. She exhibits no distension. There is no tenderness.  Musculoskeletal: She exhibits no edema and no tenderness.  Neurological: She is alert.  Skin: Skin is warm and dry. No rash noted.  Psychiatric: She has a normal mood and affect.    ED Course  Procedures (including critical care time)   Labs Reviewed  COMPREHENSIVE METABOLIC PANEL  CBC  TROPONIN I   Results for orders placed during the hospital encounter of 11/18/11  COMPREHENSIVE METABOLIC PANEL      Component Value Range   Sodium 138  135 - 145 (mEq/L)   Potassium 3.2 (*) 3.5 - 5.1 (mEq/L)   Chloride 97  96 - 112 (mEq/L)   CO2 32  19 - 32 (mEq/L)   Glucose, Bld 176 (*) 70 - 99 (mg/dL)   BUN 16  6 - 23 (mg/dL)   Creatinine, Ser 1.19  0.50 - 1.10 (mg/dL)   Calcium 9.5  8.4 - 14.7 (mg/dL)   Total Protein 6.7  6.0 - 8.3 (g/dL)   Albumin 3.6  3.5 - 5.2 (g/dL)   AST 16  0 - 37 (U/L)   ALT 15  0 - 35 (U/L)   Alkaline Phosphatase 90  39 - 117 (U/L)   Total Bilirubin 0.2 (*) 0.3 - 1.2 (mg/dL)   GFR calc non Af Amer 86 (*) >90 (mL/min)   GFR calc Af Amer >90  >90 (mL/min)  CBC      Component Value Range   WBC 8.7  4.0 - 10.5 (K/uL)   RBC 4.56  3.87 - 5.11 (MIL/uL)   Hemoglobin 11.7 (*) 12.0 - 15.0 (g/dL)   HCT 82.9  56.2 - 13.0 (%)   MCV 82.5  78.0 - 100.0 (fL)   MCH 25.7 (*) 26.0 - 34.0 (pg)   MCHC 31.1  30.0 - 36.0 (g/dL)     RDW 86.5  78.4 - 69.6 (%)   Platelets 278  150 - 400 (  K/uL)  TROPONIN I      Component Value Range   Troponin I <0.30  <0.30 (ng/mL)   Dg Chest 2 View  11/18/2011  *RADIOLOGY REPORT*  Clinical Data: Chest pain  CHEST - 2 VIEW  Comparison: 03/17/2011  Findings: The lungs are clear without focal infiltrate, edema, pneumothorax or pleural effusion. Cardiopericardial silhouette is at upper limits of normal for size.  Old right-sided rib fractures again noted.  IMPRESSION: Stable.  No acute cardiopulmonary findings.  Original Report Authenticated By: ERIC A. MANSELL, M.D.      MDM  Labs. Ecg. Cxr.   Reviewed prior chart, 2004 eval for cp, normal coronaries.     Date: 11/18/2011  Rate: 55  Rhythm: sinus bradycardia and pacs  QRS Axis: normal  Intervals: normal  ST/T Wave abnormalities: normal  Conduction Disutrbances:nonspecific intraventricular conduction delay  Narrative Interpretation:   Old EKG Reviewed: unchanged  kcl po.    After symptoms for approximately 18-20 hours, troponin negative/normal.   Pt symptoms atypical, acute worsening w positional changes/turning, lasting seconds.   Prior normal cath.  Feel stable for d/c. Will have f/u with pcp and her cardiologist closely.     Suzi Roots, MD 11/18/11 [phone]

## 2011-11-18 NOTE — Discharge Instructions (Signed)
Continue prilosec. You may also try maalox/mylanta as need. Follow up with primary care doctor in the next couple days for recheck. Also follow up with your cardiologist in the next couple days - call office to arrange these appointments.  Return to ER if worse, recurrent or persistent chest pain, trouble breathing, fevers, other concern.   From the lab work, your potassium level is mildly low (3.2) - eat plenty of fruits and vegetables, take potassium supplement as prescribed, and follow up with primary care doctor in 1 week.        Chest Pain (Nonspecific) It is often hard to give a specific diagnosis for the cause of chest pain. There is always a chance that your pain could be related to something serious, such as a heart attack or a blood clot in the lungs. You need to follow up with your caregiver for further evaluation. CAUSES   Heartburn.   Pneumonia or bronchitis.   Anxiety or stress.   Inflammation around your heart (pericarditis) or lung (pleuritis or pleurisy).   A blood clot in the lung.   A collapsed lung (pneumothorax). It can develop suddenly on its own (spontaneous pneumothorax) or from injury (trauma) to the chest.   Shingles infection (herpes zoster virus).  The chest wall is composed of bones, muscles, and cartilage. Any of these can be the source of the pain.  The bones can be bruised by injury.   The muscles or cartilage can be strained by coughing or overwork.   The cartilage can be affected by inflammation and become sore (costochondritis).  DIAGNOSIS  Lab tests or other studies, such as X-rays, electrocardiography, stress testing, or cardiac imaging, may be needed to find the cause of your pain.  TREATMENT   Treatment depends on what may be causing your chest pain. Treatment may include:   Acid blockers for heartburn.   Anti-inflammatory medicine.   Pain medicine for inflammatory conditions.   Antibiotics if an infection is present.   You may be  advised to change lifestyle habits. This includes stopping smoking and avoiding alcohol, caffeine, and chocolate.   You may be advised to keep your head raised (elevated) when sleeping. This reduces the chance of acid going backward from your stomach into your esophagus.   Most of the time, nonspecific chest pain will improve within 2 to 3 days with rest and mild pain medicine.  HOME CARE INSTRUCTIONS   If antibiotics were prescribed, take your antibiotics as directed. Finish them even if you start to feel better.   For the next few days, avoid physical activities that bring on chest pain. Continue physical activities as directed.   Do not smoke.   Avoid drinking alcohol.   Only take over-the-counter or prescription medicine for pain, discomfort, or fever as directed by your caregiver.   Follow your caregiver's suggestions for further testing if your chest pain does not go away.   Keep any follow-up appointments you made. If you do not go to an appointment, you could develop lasting (chronic) problems with pain. If there is any problem keeping an appointment, you must call to reschedule.  SEEK MEDICAL CARE IF:   You think you are having problems from the medicine you are taking. Read your medicine instructions carefully.   Your chest pain does not go away, even after treatment.   You develop a rash with blisters on your chest.  SEEK IMMEDIATE MEDICAL CARE IF:   You have increased chest pain or pain that  spreads to your arm, neck, jaw, back, or abdomen.   You develop shortness of breath, an increasing cough, or you are coughing up blood.   You have severe back or abdominal pain, feel nauseous, or vomit.   You develop severe weakness, fainting, or chills.   You have a fever.  THIS IS AN EMERGENCY. Do not wait to see if the pain will go away. Get medical help at once. Call your local emergency services (911 in [place] Do not drive yourself to the hospital. MAKE SURE YOU:    Understand these instructions.   Will watch your condition.   Will get help right away if you are not doing well or get worse.  Document Released: 05/22/2005 Document Revised: 08/01/2011 Document Reviewed: 03/17/2008 [facility] Patient Information 2012 [place], [place].Chest Pain (Nonspecific) It is often hard to give a specific diagnosis for the cause of chest pain. There is always a chance that your pain could be related to something serious, such as a heart attack or a blood clot in the lungs. You need to follow up with your caregiver for further evaluation. CAUSES   Heartburn.   Pneumonia or bronchitis.   Anxiety or stress.   Inflammation around your heart (pericarditis) or lung (pleuritis or pleurisy).   A blood clot in the lung.   A collapsed lung (pneumothorax). It can develop suddenly on its own (spontaneous pneumothorax) or from injury (trauma) to the chest.   Shingles infection (herpes zoster virus).  The chest wall is composed of bones, muscles, and cartilage. Any of these can be the source of the pain.  The bones can be bruised by injury.   The muscles or cartilage can be strained by coughing or overwork.   The cartilage can be affected by inflammation and become sore (costochondritis).  DIAGNOSIS  Lab tests or other studies, such as X-rays, electrocardiography, stress testing, or cardiac imaging, may be needed to find the cause of your pain.  TREATMENT   Treatment depends on what may be causing your chest pain. Treatment may include:   Acid blockers for heartburn.   Anti-inflammatory medicine.   Pain medicine for inflammatory conditions.   Antibiotics if an infection is present.   You may be advised to change lifestyle habits. This includes stopping smoking and avoiding alcohol, caffeine, and chocolate.   You may be advised to keep your head raised (elevated) when sleeping. This reduces the chance of acid going backward from your stomach into your  esophagus.   Most of the time, nonspecific chest pain will improve within 2 to 3 days with rest and mild pain medicine.  HOME CARE INSTRUCTIONS   If antibiotics were prescribed, take your antibiotics as directed. Finish them even if you start to feel better.   For the next few days, avoid physical activities that bring on chest pain. Continue physical activities as directed.   Do not smoke.   Avoid drinking alcohol.   Only take over-the-counter or prescription medicine for pain, discomfort, or fever as directed by your caregiver.   Follow your caregiver's suggestions for further testing if your chest pain does not go away.   Keep any follow-up appointments you made. If you do not go to an appointment, you could develop lasting (chronic) problems with pain. If there is any problem keeping an appointment, you must call to reschedule.  SEEK MEDICAL CARE IF:   You think you are having problems from the medicine you are taking. Read your medicine instructions carefully.  Your chest pain does not go away, even after treatment.   You develop a rash with blisters on your chest.  SEEK IMMEDIATE MEDICAL CARE IF:   You have increased chest pain or pain that spreads to your arm, neck, jaw, back, or abdomen.   You develop shortness of breath, an increasing cough, or you are coughing up blood.   You have severe back or abdominal pain, feel nauseous, or vomit.   You develop severe weakness, fainting, or chills.   You have a fever.  THIS IS AN EMERGENCY. Do not wait to see if the pain will go away. Get medical help at once. Call your local emergency services (911 in [place] Do not drive yourself to the hospital. MAKE SURE YOU:   Understand these instructions.   Will watch your condition.   Will get help right away if you are not doing well or get worse.  Document Released: 05/22/2005 Document Revised: 08/01/2011 Document Reviewed: 03/17/2008 [facility] Patient Information 2012 [place],  [place].       Hypokalemia Hypokalemia means a low potassium level in the blood.Potassium is an electrolyte that helps regulate the amount of fluid in the body. It also stimulates muscle contraction and maintains a stable acid-base balance.Most of the body's potassium is inside of cells, and only a very small amount is in the blood. Because the amount in the blood is so small, minor changes can have big effects. PREPARATION FOR TEST Testing for potassium requires taking a blood sample taken by needle from a vein in the arm. The skin is cleaned thoroughly before the sample is drawn. There is no other special preparation needed. NORMAL VALUES Potassium levels below 3.5 mEq/L are abnormally low. Levels above 5.1 mEq/L are abnormally high. Ranges for normal findings may vary among different laboratories and hospitals. You should always check with your doctor after having lab work or other tests done to discuss the meaning of your test results and whether your values are considered within normal limits. MEANING OF TEST  Your caregiver will go over the test results with you and discuss the importance and meaning of your results, as well as treatment options and the need for additional tests, if necessary. A potassium level is frequently part of a routine medical exam. It is usually included as part of a whole "panel" of tests for several blood salts (such as Sodium and Chloride). It may be done as part of follow-up when a low potassium level was found in the past or other blood salts are suspected of being out of balance. A low potassium level might be suspected if you have one or more of the following:  Symptoms of weakness.   Abnormal heart rhythms.   High blood pressure and are taking medication to control this, especially water pills (diuretics).   Kidney disease that can affect your potassium level .   Diabetes requiring the use of insulin. The potassium may fall after taking insulin,  especially if the diabetes had been out of control for a while.   A condition requiring the use of cortisone-type medication or certain types of antibiotics.   Vomiting and/or diarrhea for more than a day or two.   A stomach or intestinal condition that may not permit appropriate absorption of potassium.   Fainting episodes.   Mental confusion.  OBTAINING TEST RESULTS It is your responsibility to obtain your test results. Ask the lab or department performing the test when and how you will get your results.  Please contact your caregiver directly if you have not received the results within one week. At that time, ask if there is anything different or new you should be doing in relation to the results. TREATMENT Hypokalemia can be treated with potassium supplements taken by mouth and/or adjustments in your current medications. A diet high in potassium is also helpful. Foods with high potassium content are:  Peas, lentils, lima beans, nuts, and dried fruit.   Whole grain and bran cereals and breads.   Fresh fruit, vegetables (bananas, cantaloupe, grapefruit, oranges, tomatoes, honeydew melons, potatoes).   Orange and tomato juices.   Meats. If potassium supplement has been prescribed for you today or your medications have been adjusted, see your personal caregiver in time02 for a re-check.  SEEK MEDICAL CARE IF:  There is a feeling of worsening weakness.   You experience repeated chest palpitations.   You are diabetic and having difficulty keeping your blood sugars in the normal range.   You are experiencing vomiting and/or diarrhea.   You are having difficulty with any of your regular medications.  SEEK IMMEDIATE MEDICAL CARE IF:  You experience chest pain, shortness of breath, or episodes of dizziness.   You have been having vomiting or diarrhea for more than 2 days.   You have a fainting episode.  MAKE SURE YOU:   Understand these instructions.   Will watch your  condition.   Will get help right away if you are not doing well or get worse.  Document Released: 08/12/2005 Document Revised: 08/01/2011 Document Reviewed: 07/23/2008 [facility] Patient Information 2012 [place], [place].

## 2011-11-20 ENCOUNTER — Ambulatory Visit (INDEPENDENT_AMBULATORY_CARE_PROVIDER_SITE_OTHER): Payer: Medicare Other | Admitting: Family Medicine

## 2011-11-20 VITALS — BP 163/49 | HR 72 | Temp 98.1°F | Ht 66.0 in | Wt 260.0 lb

## 2011-11-20 DIAGNOSIS — M545 Low back pain, unspecified: Secondary | ICD-10-CM

## 2011-11-20 NOTE — Patient Instructions (Signed)
Back Pain, Adult Low back pain is very common. About 1 in 5 people have back pain.The cause of low back pain is rarely dangerous. The pain often gets better over time.About half of people with a sudden onset of back pain feel better in just 2 weeks. About 8 in 10 people feel better by 6 weeks.  CAUSES Some common causes of back pain include:  Strain of the muscles or ligaments supporting the spine.   Wear and tear (degeneration) of the spinal discs.   Arthritis.   Direct injury to the back.  DIAGNOSIS Most of the time, the direct cause of low back pain is not known.However, back pain can be treated effectively even when the exact cause of the pain is unknown.Answering your caregiver's questions about your overall health and symptoms is one of the most accurate ways to make sure the cause of your pain is not dangerous. If your caregiver needs more information, he or she may order lab work or imaging tests (X-rays or MRIs).However, even if imaging tests show changes in your back, this usually does not require surgery. HOME CARE INSTRUCTIONS For many people, back pain returns.Since low back pain is rarely dangerous, it is often a condition that people can learn to manageon their own.   Remain active. It is stressful on the back to sit or stand in one place. Do not sit, drive, or stand in one place for more than 30 minutes at a time. Take short walks on level surfaces as soon as pain allows.Try to increase the length of time you walk each day.   Do not stay in bed.Resting more than 1 or 2 days can delay your recovery.   Do not avoid exercise or work.Your body is made to move.It is not dangerous to be active, even though your back may hurt.Your back will likely heal faster if you return to being active before your pain is gone.   Pay attention to your body when you bend and lift. Many people have less discomfortwhen lifting if they bend their knees, keep the load close to their  bodies,and avoid twisting. Often, the most comfortable positions are those that put less stress on your recovering back.   Find a comfortable position to sleep. Use a firm mattress and lie on your side with your knees slightly bent. If you lie on your back, put a pillow under your knees.   Only take over-the-counter or prescription medicines as directed by your caregiver. Over-the-counter medicines to reduce pain and inflammation are often the most helpful.Your caregiver may prescribe muscle relaxant drugs.These medicines help dull your pain so you can more quickly return to your normal activities and healthy exercise.   Put ice on the injured area.   Put ice in a plastic bag.   Place a towel between your skin and the bag.   Leave the ice on for 15 to 20 minutes, 3 to 4 times a day for the first 2 to 3 days. After that, ice and heat may be alternated to reduce pain and spasms.   Ask your caregiver about trying back exercises and gentle massage. This may be of some benefit.   Avoid feeling anxious or stressed.Stress increases muscle tension and can worsen back pain.It is important to recognize when you are anxious or stressed and learn ways to manage it.Exercise is a great option.  SEEK MEDICAL CARE IF:  You have pain that is not relieved with rest or medicine.   You have   pain that does not improve in 1 week.   You have new symptoms.   You are generally not feeling well.  SEEK IMMEDIATE MEDICAL CARE IF:   You have pain that radiates from your back into your legs.   You develop new bowel or bladder control problems.   You have unusual weakness or numbness in your arms or legs.   You develop nausea or vomiting.   You develop abdominal pain.   You feel faint.  Document Released: 08/12/2005 Document Revised: 08/01/2011 Document Reviewed: 12/31/2010 [employer] Patient Information 2012 [employer]. 

## 2011-11-20 NOTE — Progress Notes (Signed)
  Subjective:    Patient ID: Kaitlyn Ferguson, female    DOB: [birth year], 66 y.o.   MRN: [id number]  HPI  Patient presents with 2 days of right-sided lower back pain. She has not had any injury. She worked out on Monday at [employer] and did not notice any pain at that time. She said she has had several episodes of back pain in the past. Usually Aleve will take care of the pain and she has not taken Aleve today. She took Vicodin yesterday because the pain didn't seem to be easing but Vicodin did not help. When she moves she can occasionally get a sharp pain in that area. The pain feels better when she is seated with something pressed against that area. It hurts worse when she is trying to get up and out of a chair. She has not had any changes in her urine. She's not had any bowel or bladder changes. She's not had any fevers. She does not know of any injury. She has a distant history of back surgery.  Patient was seen 2 days ago in the ED for chest pain. She was found to have a low potassium. She has not had any chest pain since that time. She did not have any chest pain on Monday when she worked out. These records were reviewed with her today. Review of Systems No nausea, vomiting, abdominal pain.    Objective:   Physical Exam GEN-uncomfortable appearing Heart - normal rate, regular rhythm, normal S1, S2, no murmurs, rubs, clicks or gallops Chest - clear to auscultation, no wheezes, rales or rhonchi, symmetric air entry, no tachypnea, retractions or cyanosis Abdomen - soft, nontender, nondistended, no masses or organomegaly MSK-patient is tender with an area of muscle spasm near L4 on the right. No tenderness on the spine. Straight leg raise is negative bilaterally. Full range of motion of the spine. Full strength of the legs bilaterally at hips, calf, ankles. She has a history of injury at the right ankle and walks with a limp. She says this is not new.       Assessment & Plan:

## 2011-11-20 NOTE — Assessment & Plan Note (Signed)
Patient with right-sided lower back pain and muscle spasm. Gave instructions for heat or ice to the area. To take Aleve for the next 3-4 days. To modify her exercise so as not to increase her pain. To stay active. I do not think that this represents a kidney stone at this time because it is changed by movement. No fevers or other signs of infection.

## 2011-11-29 ENCOUNTER — Ambulatory Visit (INDEPENDENT_AMBULATORY_CARE_PROVIDER_SITE_OTHER): Payer: Medicare Other | Admitting: Pharmacist

## 2011-11-29 ENCOUNTER — Encounter: Payer: Self-pay | Admitting: Pharmacist

## 2011-11-29 VITALS — BP 148/68 | HR 83 | Temp 98.5°F | Ht 67.5 in | Wt 259.0 lb

## 2011-11-29 DIAGNOSIS — E119 Type 2 diabetes mellitus without complications: Secondary | ICD-10-CM

## 2011-11-29 MED ORDER — LIRAGLUTIDE 18 MG/3ML ~~LOC~~ SOLN: 1.2000 mg | Freq: Every day | SUBCUTANEOUS | Status: DC

## 2011-11-29 NOTE — Assessment & Plan Note (Signed)
Diabetes of >15 yrs duration currently under poor control of blood glucose based on  . Lab Results  Component Value Date   HGBA1C 9.0 11/06/2011     ,home fasting CBG readings of 100-150 and post-prandial  CBG readings of >300 post dinner. Control is suboptimal due to suboptimal drug regimen and dietary indiscretion. Denies hypoglycemic events.  Able to verbalize appropriate hypoglycemia management plan. Initiated a trial of  Victoza in addition to lanuts.   Decreased dose of lantus to 70 units this week and to 65 units next week.   Written patient instructions provided.  Follow up in  Pharmacist Clinic Visit in 1 month.    Total time in face to face counseling .  Patient seen with Medical student [facility].

## 2011-11-29 NOTE — Progress Notes (Signed)
  Subjective:    Patient ID: Kaitlyn Ferguson, female    DOB: [birth year], 66 y.o.   MRN: [id number]  HPI Patient arrives > 1 hour early for visit and was very willing to wait for visit.   She reports being dx with DM for > 15 years.   She has taken lantus since 1998.    She was previously on rapid insulin as well HOWEVER she mistakenly took her dose of lantus (98 units) as NOVOLOG and was admitted to the hospital with hypoglycemia.   Since that time she has NOT taken mealtime insulin.   She is reluctant to restart meal time insulin but is willing if necessary.   Her home CBG fasting records are 100-150  Late post-prandial (2.5 hours post dinner) are > 300   Patient reports that she is going on vacation [place] over the next week.   Review of Systems     Objective:   Physical Exam        Assessment & Plan:   Diabetes of >15 yrs duration currently under poor control of blood glucose based on  . Lab Results  Component Value Date   HGBA1C 9.0 11/06/2011     ,home fasting CBG readings of 100-150 and post-prandial  CBG readings of >300 post dinner. Control is suboptimal due to suboptimal drug regimen and dietary indiscretion. Denies hypoglycemic events.  Able to verbalize appropriate hypoglycemia management plan. Initiated a trial of  Victoza in addition to lanuts.   Decreased dose of lantus to 70 units this week and to 65 units next week.   Written patient instructions provided.  Follow up in  Pharmacist Clinic Visit in 1 month.    Total time in face to face counseling .  Patient seen with Medical student [facility].

## 2011-11-29 NOTE — Progress Notes (Signed)
  Subjective:    Patient ID: Kaitlyn Ferguson, female    DOB: Apr 24, 1946, 66 y.o.   MRN: [id number]  HPI Reviewed and agree with Dr. Macky Lower management.    Review of Systems     Objective:   Physical Exam        Assessment & Plan:

## 2011-11-29 NOTE — Patient Instructions (Signed)
Decrease Lantus to 70 units each morning starting tomorrow.  Start Victoza 0.6mg  once daily in the morning starting tomorrow.   After one week,  Decrease lantus to 65 units each moring and increase Victoza to 1.2 mg each morning.  IF you see low blood sugars during this week you can lower your lantus by 5 more units.  Call if you have more than a few low blood sugar readings.   Follow up with pharmacist clinic in 1 month.

## 2011-12-29 ENCOUNTER — Telehealth: Payer: Self-pay | Admitting: Family Medicine

## 2011-12-29 NOTE — Telephone Encounter (Signed)
Pt calling the emergency line to let me know that she accidentally took her victoza and lantus dose again tonight.  These are supposed to be taken once daily.  Today, by accident, she took them BID.  Calling emergency line to ask what she should do.  Checked BG moments before we talked on the phone and BG was in low 100's. She states she doesn't want to come to the ER because she is not able to afford the bill.  Instructed pt that I recommend that she come to the ER for evaluation.  If she doesn't come in to the ER she needs to have someone come stay with her that can keep a close eye on her.  Also instructed her to check her blood glucose level every hour throughout the night and treat any low blood glucose levels with a fast acting carb- 8 oz of soda (nondiet), 3 spoonfuls of sugar, 8 oz of orange juice.  If any lows must treat and recheck in 15 min to insure that low has improved.  Instructed her that if a low occurs and it doesn't respond to po glucose intake she must immediately come to the ER- this is an emergency!  Pt states understanding.  Was able to teach back above plan.  Calling son immediately after getting off phone with me.   Pt to call back if any questions or concerns.

## 2011-12-30 NOTE — Telephone Encounter (Signed)
Called patient at midnight and again at 8:00 this morning to check in on her.  She states that she has done well overnight.  The lowest that she went to was 80 and that was this morning.  She is going to hold insulin today and restart tomorrow am- she states that she prefers to take the insulin shot in the morning.  Pt is aware that her blood glucose level is going to go up since this will leave her 12 hours without lantus coverage during the night tonight.  Pt states she understands this.  Will forward to pt pcp.

## 2012-01-03 ENCOUNTER — Ambulatory Visit (INDEPENDENT_AMBULATORY_CARE_PROVIDER_SITE_OTHER): Payer: Medicare Other | Admitting: Pharmacist

## 2012-01-03 ENCOUNTER — Encounter: Payer: Self-pay | Admitting: Pharmacist

## 2012-01-03 VITALS — BP 164/90 | HR 64 | Ht 67.0 in | Wt 254.0 lb

## 2012-01-03 DIAGNOSIS — E119 Type 2 diabetes mellitus without complications: Secondary | ICD-10-CM

## 2012-01-03 NOTE — Assessment & Plan Note (Signed)
Diabetes of > 15 yrs duration currently under fair control of blood glucose based on glucose log book from the last several weeks being at goal as well as recent exercise regimen. Lab Results Component Value Date  HGBA1C 9.0 11/06/2011    Her home fasting CBG readings of 85-140. Control is suboptimal based eating habits but is improving and her A1c should improve on the next lab check. Denies hypoglycemic events but lantus dose maybe a concern now that victoza is at 1.2 mg a day.  Able to verbalize appropriate hypoglycemia management plan. Decreased dose of basal insulin Lantus (insulin glargine) to 60 units daily in the morning and continue Victoza 1.2 mg daily in the morning. Written patient instructions provided.  Follow up in  Pharmacist Clinic Visit 1 month.   Total time in face to face counseling 30 minutes.  Patient seen with Melynda Ripple, PharmD. Candidate and Janace Litten, PharmD, Pharmacy Resident.

## 2012-01-03 NOTE — Progress Notes (Signed)
  Subjective:    Patient ID: Kaitlyn Ferguson, female    DOB: 1945/11/16, 66 y.o.   MRN: [id number]  HPI 66 y.o female with Diabetes, presents today for follow up of her diabetes. She reports feeling well and doing "great" on her regimen of lantus and victoza. This week she had an episode where she accidentally took an extra dose of her lantus and victoza in the evening. This resulted in a glucose level in the 80s. After calling her MD, she was told to hold her insulin for the next day. She reports exercising at the gym 4 days a week. 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 Lab Results  Component Value Date   HGBA1C 9.0 11/06/2011     Her home fasting CBG readings of 85-140. Control is suboptimal based eating habits but is improving and her A1c should improve on the next lab check. Denies hypoglycemic events but lantus dose maybe a concern now that victoza is at 1.2 mg a day.  Able to verbalize appropriate hypoglycemia management plan. Decreased dose of basal insulin Lantus (insulin glargine) to 60 units daily in the morning and continue Victoza 1.2 mg daily in the morning. Written patient instructions provided.  Follow up in  Pharmacist Clinic Visit 1 month.   Total time in face to face counseling 30 minutes.  Patient seen with Melynda Ripple, PharmD. Candidate and Janace Litten, PharmD, Pharmacy Resident.

## 2012-01-03 NOTE — Patient Instructions (Signed)
Good job! Continue Victoza 1.2 mcg daily in the morning Decrease Lantus to 60 units daily in the morning Do not take any injections at night! Continue exercise regimen at the gym

## 2012-01-06 NOTE — Progress Notes (Signed)
Patient ID: Kaitlyn Ferguson, female   DOB: 1945/12/19, 66 y.o.   MRN: [id number] Reviewed and agree with agree with Dr. Macky Lower management.

## 2012-01-31 ENCOUNTER — Encounter: Payer: Self-pay | Admitting: Family Medicine

## 2012-01-31 ENCOUNTER — Ambulatory Visit (INDEPENDENT_AMBULATORY_CARE_PROVIDER_SITE_OTHER): Payer: Medicare Other | Admitting: Family Medicine

## 2012-01-31 VITALS — BP 140/80 | Temp 98.6°F | Ht 66.6 in | Wt 254.0 lb

## 2012-01-31 DIAGNOSIS — M545 Low back pain, unspecified: Secondary | ICD-10-CM

## 2012-01-31 DIAGNOSIS — E119 Type 2 diabetes mellitus without complications: Secondary | ICD-10-CM

## 2012-01-31 LAB — POCT GLYCOSYLATED HEMOGLOBIN (HGB A1C): Hemoglobin A1C: 7.5

## 2012-01-31 MED ORDER — TRAMADOL HCL 50 MG PO TABS: 50.0000 mg | ORAL_TABLET | Freq: Three times a day (TID) | ORAL | Status: AC | PRN

## 2012-01-31 NOTE — Patient Instructions (Addendum)
Your A1c was much better, today 7.5 (down from 9.0). Continue to exercise and avoid sugars and fried foods.  You have lost 5 lbs in 2 months. Continue current diabetes regimen and schedule follow up appointment with Dr. Raymondo Band. For pain, take Tramadol 50 mg 1 tablet every 8 hours as needed for pain. I will refer you to Physical Therapy.  We will call you with time and date of appointment. Schedule follow up appointment with me in 3 months.

## 2012-01-31 NOTE — Assessment & Plan Note (Signed)
Toradol 60 IM today.  Rx for Tramadol PRN pain. Referral to PT to strengthen core, address low back and knee pain. Counseled continued weight loss and exercise for pain improvement. Follow up in 3 months or sooner as needed.

## 2012-01-31 NOTE — Assessment & Plan Note (Signed)
A1c today 7.5 (decreased from 9.0). Patient continues to exercise and eat a CHO modified diet, Will continue current regimen (Lantus 60 and Victoza 1.2 in AM). Patient to follow up with Dr. Raymondo Band in one month. Repeat A1c in 3 months.

## 2012-01-31 NOTE — Progress Notes (Signed)
  Subjective:    Patient ID: Kaitlyn Ferguson, female    DOB: March 05, 1946, 66 y.o.   MRN: [id number]  HPI  Lumbago: Acute on chronic back pain.  History of ruptured cyst iand back surgery in 1984. She did well after the surgery.  A few years ago, she developed intermittent low back pain.  Does not radiate.  Denies any numbness/tingling sensation of lower extremities.  However, with certain motions, she feels a shooting pain down her left thigh.  Usually takes Aleve for pain which helps a little.  Denies any rashes on her back.  Denies any recent falls or trauma.  Diabetes, Type 2: Patient's a1c dropped from 9.0 to 7.5 after changing medication regimen per Dr. Raymondo Band.  Currently takes Victoza 1.2 and Lantus 60 in the AM.  She did not bring records of blood sugars.  Patient continues to exercise, lift weights, and tries to eat a healthy diet.  Denies any foot ulcers, blurry vision, headache, or fatigue.  Review of Systems  Per HPI    Objective:   Physical Exam  Constitutional: No distress.  HENT:  Head: Normocephalic and atraumatic.  Cardiovascular: Normal rate, regular rhythm and normal heart sounds.   Pulmonary/Chest: Effort normal and breath sounds normal.  Abdominal: Soft. Bowel sounds are normal.  Musculoskeletal:       No pain on palpation of lumbar spine; pain is better with flexion, worse with extension.  Full range of motion of the spine. Full strength of the legs bilaterally at hips and knees.  Positive SLR bilaterally.  Skin: Skin is warm and dry. No erythema.         Assessment & Plan:

## 2012-02-03 MED ORDER — KETOROLAC TROMETHAMINE 60 MG/2ML IM SOLN
60.0000 mg | Freq: Once | INTRAMUSCULAR | Status: AC
  Administered 2012-02-03: 60 mg via INTRAMUSCULAR

## 2012-02-03 NOTE — Progress Notes (Signed)
Addended by: Deno Etienne on: 02/03/2012 11:09 AM   Modules accepted: Orders

## 2012-02-13 ENCOUNTER — Telehealth: Payer: Self-pay | Admitting: Family Medicine

## 2012-02-13 MED ORDER — MELOXICAM 15 MG PO TABS: 15.0000 mg | ORAL_TABLET | Freq: Every day | ORAL | Status: DC

## 2012-02-13 NOTE — Telephone Encounter (Signed)
Tramadol is not working and wants to know if there is anything else that Dr Tommi Rumps Lawson Radar can give her.  Very painful knees

## 2012-02-13 NOTE — Telephone Encounter (Signed)
Called pt and informed her that Rx has been sent to her Pharmacy and if it doesn't work for her she will need to be seen.Kaitlyn Ferguson, Viann Shove

## 2012-02-13 NOTE — Telephone Encounter (Signed)
Forwarded to Dr.de [employer] for advice.Loralee Pacas [place]

## 2012-02-13 NOTE — Telephone Encounter (Signed)
Please call patient and inform her that I have sent new pain medication to pharmacy.  If pain does not improve with new medication, she will need to be seen by PCP.  Thanks!

## 2012-02-14 ENCOUNTER — Encounter: Payer: Medicare Other | Admitting: Home Health Services

## 2012-03-10 ENCOUNTER — Encounter: Payer: Self-pay | Admitting: Pharmacist

## 2012-03-10 ENCOUNTER — Ambulatory Visit (INDEPENDENT_AMBULATORY_CARE_PROVIDER_SITE_OTHER): Payer: Medicare Other | Admitting: Pharmacist

## 2012-03-10 VITALS — BP 149/79 | HR 71 | Ht 67.0 in | Wt 250.3 lb

## 2012-03-10 DIAGNOSIS — E119 Type 2 diabetes mellitus without complications: Secondary | ICD-10-CM

## 2012-03-10 NOTE — Assessment & Plan Note (Signed)
Diabetes of many yrs duration currently under excellent control of blood glucose based on  . Lab Results  Component Value Date   HGBA1C 7.5 01/31/2012     ,home fasting CBG readings of 100-130 and random CBG readings of <180. Control is suboptimal due to dietary inidiscretion and exercise plan however she is much improved on these and is losing weight gradually.  Denies hypoglycemic events.  Able to verbalize appropriate hypoglycemia management plan. Continued basal insulin Lantus (insulin glargine) and Victoza with NO change anticipated.   IF CBGS begin to run 90 then consider dose of lantus to be reduced by 5-10 % of total dose to be appropriate.  Written patient instructions provided.  Follow up in  Pharmacist Clinic Visit AFTER next visit with Dr. Tye Savoy.   Total time in face to face counseling 15 minutes.

## 2012-03-10 NOTE — Progress Notes (Signed)
  Subjective:    Patient ID: Kaitlyn Ferguson, female    DOB: [birth year], 66 y.o.   MRN: [id number]  HPI  Patient arrives in good spirits.  She is very happy with her progress with weight loss AND she is happy with her activity level.   She denies ANY episodes of hypoglycemia with lantus Victoza combination therapy.   Review of CBG log book reveals readings of  100-130 routinely with some numbers 130-180.   Minimal readings < 100.     Review of Systems     Objective:   Physical Exam        Assessment & Plan:   Diabetes of many yrs duration currently under excellent control of blood glucose based on  . Lab Results  Component Value Date   HGBA1C 7.5 01/31/2012     ,home fasting CBG readings of 100-130 and random CBG readings of <180. Control is suboptimal due to dietary inidiscretion and exercise plan however she is much improved on these and is losing weight gradually.  Denies hypoglycemic events.  Able to verbalize appropriate hypoglycemia management plan. Continued basal insulin Lantus (insulin glargine) an Victoza with NO change anticipated.   IF CBGS begin to run 90 then consider dose of lantus to be reduced by 5-10 % of total dose to be appropriate.  Written patient instructions provided.  Follow up in  Pharmacist Clinic Visit AFTER next visit with Dr. Tye Savoy.   Total time in face to face counseling 15 minutes.

## 2012-03-10 NOTE — Patient Instructions (Addendum)
Congratulations on your Blood Sugars!!! AND your weight loss.   If you see more than 2 readings of 89 or less in 1 week please call our office and talk with Dr. Raymondo Band or Dr. Tye Savoy.   Keep up your exercise and weight loss plan!!! You are doing great.    Follow up with Dr. Venancio Poisson in September

## 2012-03-11 NOTE — Progress Notes (Signed)
Patient ID: Kaitlyn Ferguson, female   DOB: February 09, 1946, 66 y.o.   MRN: [id number] Reviewed and agree with Dr. Macky Lower documentation and management.

## 2012-03-12 ENCOUNTER — Other Ambulatory Visit: Payer: Self-pay | Admitting: Family Medicine

## 2012-03-26 ENCOUNTER — Other Ambulatory Visit: Payer: Self-pay | Admitting: Sports Medicine

## 2012-03-30 ENCOUNTER — Telehealth: Payer: Self-pay | Admitting: Family Medicine

## 2012-03-30 NOTE — Telephone Encounter (Signed)
Patient is calling to let Dr. Tye Savoy and Dr. Raymondo Band know that she has had several low Blood Sugars in a week and decrease her Lantus 60 units to 55 units.  She said she was told to call if this has happened.

## 2012-03-30 NOTE — Telephone Encounter (Signed)
Patient reports multiple low blood sugar readings which were < 70.   She reduced her lantus from 60 to 55 yesterday .  She is willing to continue on Lantus at a dose of 55 BUT told patient if AM fasting CBGs remain < 100 to continue lantusdown titration.   Patient verbalized understanding of plan to reduce Lantus further to minimize risk of low CBGs while she continues on her weight loss plan.   Asked for her to call us back if she continue to experience low readings.

## 2012-03-31 ENCOUNTER — Other Ambulatory Visit: Payer: Self-pay | Admitting: Internal Medicine

## 2012-03-31 DIAGNOSIS — Z1231 Encounter for screening mammogram for malignant neoplasm of breast: Secondary | ICD-10-CM

## 2012-04-06 ENCOUNTER — Other Ambulatory Visit: Payer: Self-pay | Admitting: Sports Medicine

## 2012-04-06 ENCOUNTER — Ambulatory Visit: Payer: Medicare Other | Admitting: Family Medicine

## 2012-04-07 ENCOUNTER — Other Ambulatory Visit: Payer: Self-pay | Admitting: *Deleted

## 2012-04-08 MED ORDER — LEVOTHYROXINE SODIUM 112 MCG PO TABS: 112.0000 ug | ORAL_TABLET | Freq: Every day | ORAL | Status: DC

## 2012-04-24 ENCOUNTER — Encounter: Payer: Self-pay | Admitting: Family Medicine

## 2012-04-24 ENCOUNTER — Ambulatory Visit (INDEPENDENT_AMBULATORY_CARE_PROVIDER_SITE_OTHER): Payer: Medicare Other | Admitting: Family Medicine

## 2012-04-24 VITALS — BP 116/57 | HR 73 | Temp 98.8°F | Ht 67.0 in | Wt 241.2 lb

## 2012-04-24 DIAGNOSIS — J069 Acute upper respiratory infection, unspecified: Secondary | ICD-10-CM

## 2012-04-24 MED ORDER — BENZONATATE 100 MG PO CAPS: ORAL_CAPSULE | ORAL | Status: DC

## 2012-04-24 NOTE — Progress Notes (Signed)
Patient ID: Kaitlyn Ferguson, female   DOB: 11/18/1945, 66 y.o.   MRN: [id number] Subjective: The patient is a 66 y.o. year old female who presents today for cold.  1. Cold: Patient having problems with sore throat, rhinorrhea, cough and some chest discomfort with cough.  No nausea/vomiting/dirrhea.  Subjective fevers, none documented.  No headaches or facial pain.  Has taken ASA but nothing else.  Patient's past medical, social, and family history were reviewed and updated as appropriate. History  Substance Use Topics  . Smoking status: Never Smoker   . Smokeless tobacco: Never Used   Comment: No tobacco currently  . Alcohol Use: No   Objective:  Filed Vitals:   04/24/12 0859  BP: 116/57  Pulse: 73   Gen: NAD HEENT: MMM, EOMI, pharynx non-erythematous.  Slight cervical adenopathy, non-tender.  TM normal bilaterally.  No facial tenderness. CV: RRR Resp: CTABL, no focal findings Ext: Good cap refills, 2+ pulses  Assessment/Plan: Viral URI, symptomatic treatment as has been going on for 5 days now.  Tessalon for cough, Tylenol for malaise.  RTC if not improved in 5-7 days.  Please also see individual problems in problem list for problem-specific plans.

## 2012-04-24 NOTE — Patient Instructions (Signed)
You have a viral infection that will resolve on its own over time.  Symptoms typically last 3-7 days but can stretch out to 2-3 weeks. You can use mucinex or the medicine I am giving you for cough.  Unfortunately, antibiotics are not helpful for viral infections. Drink plenty of fluids and stay hydrated! Wash your hands frequently. Call if you are not improving by 7-10 days.

## 2012-05-04 ENCOUNTER — Ambulatory Visit
Admission: RE | Admit: 2012-05-04 | Discharge: 2012-05-04 | Disposition: A | Discharge status: Home / Self Care | Payer: Medicare Other | Source: Ambulatory Visit | Attending: Internal Medicine | Admitting: Internal Medicine

## 2012-05-04 ENCOUNTER — Other Ambulatory Visit: Payer: Self-pay | Admitting: Sports Medicine

## 2012-05-04 DIAGNOSIS — Z1231 Encounter for screening mammogram for malignant neoplasm of breast: Secondary | ICD-10-CM

## 2012-05-06 ENCOUNTER — Ambulatory Visit (INDEPENDENT_AMBULATORY_CARE_PROVIDER_SITE_OTHER): Payer: Medicare Other | Admitting: Family Medicine

## 2012-05-06 ENCOUNTER — Encounter: Payer: Self-pay | Admitting: Family Medicine

## 2012-05-06 VITALS — BP 149/67 | HR 66 | Temp 99.1°F | Ht 67.0 in | Wt 243.0 lb

## 2012-05-06 DIAGNOSIS — M545 Low back pain, unspecified: Secondary | ICD-10-CM

## 2012-05-06 MED ORDER — HYDROCODONE-ACETAMINOPHEN 5-325 MG PO TABS: 1.0000 | ORAL_TABLET | Freq: Four times a day (QID) | ORAL | Status: DC | PRN

## 2012-05-06 MED ORDER — BACLOFEN 10 MG PO TABS: 10.0000 mg | ORAL_TABLET | Freq: Three times a day (TID) | ORAL | Status: DC

## 2012-05-06 NOTE — Assessment & Plan Note (Signed)
Norco for pain relief, muscle relaxer to help relieve spasm. She wants to continue working out, recommended she should rest due to amount of pain and to prevent re-injury for at least 2-3 days, at that point she should ease back into working out.   Heat and massage, see instructions.

## 2012-05-06 NOTE — Patient Instructions (Addendum)
Take the Norco for relief of your pain.  Take the Baclofen as muscle relaxer.   Don't drive with the Baclofen.  Heat and massage are good to help the muscle relax.    Take at least a day or two off of working out.   Ease back into your workouts, mostly walking and low impact.    It was good to meet you and I hope you feel better.

## 2012-05-06 NOTE — Progress Notes (Signed)
  Subjective:    Patient ID: Kaitlyn Ferguson, female    DOB: 1946/08/13, 66 y.o.   MRN: [id number]  HPI 1.  Back pain:  Started on Saturday while she was working out.  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  Worked through pain at that time.  Pain persisted but she tried working out again yesterday, did free weights and no ab machine, but pain returned very strong last night.    Has been taking 1000 mg Tylenol, Advil, and Tramadol with little relief for past 2 days.  No fevers or chills.  No bladder/bowel incontinence.  No dysuria.  No hematuria.  No history of kidney stones.  Had cholecystectomy about 4 years ago.  Had back surgery for ruptured disc in 1984, has had low back pain in back but nothing like she has now.  This pain is not as bad as ruptured disc.    Review of Systems See HPI above for review of systems.       Objective:   Physical Exam Gen:  Alert, cooperative patient who appears stated age in no acute distress.  She is alternating between sitting and standing and looks uncomfortable.  Vital signs reviewed. Cardiac:  Regular rate and rhythm without murmur auscultated.  Good S1/S2. Pulm:  Clear to auscultation bilaterally with good air movement.  No wheezes or rales noted.   Back:  Normal skin, Spine with normal alignment and no deformity.  No tenderness to vertebral process palpation.  Paraspinous muscles are tender and with spasm in lumbar region.   Range of motion is full at neck and decreased flexion in lumbar sacral regions limited by pain.  Straight leg raise is positive for back pain. Neuro:  Sensation and motor function 5/5 bilateral lower extremities.  Patellar and Achilles  DTR's +2 patellar BL         Assessment & Plan:

## 2012-05-07 ENCOUNTER — Telehealth: Payer: Self-pay | Admitting: Family Medicine

## 2012-05-07 NOTE — Telephone Encounter (Signed)
Called and spoke with patient.  She is still having pain.  Has taken 1 of Norco today and 1 of muscle relaxer, both this AM.  Did not want to take more because she did not want to take more than what prescription stated.  Some radiating of pain down Right leg.  No bladder/bowel incontinence.  No saddle anesthesia.  Difficulty sleeping last night due to pain.    Recommended she can take 2 of the Norco every 4 hours for pain relief.  Can also take 2 of the muscle relaxers tonight before she goes to bed.  She plans to follow up tomorrow in clinic.  Gave red flags that would trigger her to go to ED tonight.  Patient expressed understanding and appreciation of call.

## 2012-05-07 NOTE — Telephone Encounter (Signed)
Patient calls back . States she is in so much pain and  has been crying all day.  Can't sit .  Upset over the phone , states she has called three times . Advised Dr. Gwendolyn Grant is with a patient now but will call her back as soon as he comes out of room he is currently in.

## 2012-05-07 NOTE — Telephone Encounter (Signed)
Forward to Dr Walden 

## 2012-05-07 NOTE — Telephone Encounter (Signed)
Pt was here yesterday and states that the pain meds are not working and needs to know what to do.  It's so bad that she can hardly take a deep breath Wants to speak with Dr Gwendolyn Grant

## 2012-05-08 ENCOUNTER — Inpatient Hospital Stay (HOSPITAL_COMMUNITY)
Admission: EM | Admit: 2012-05-08 | Discharge: 2012-05-10 | DRG: 918 | Disposition: A | Discharge status: Home / Self Care | Payer: Medicare Other | Attending: Family Medicine | Admitting: Family Medicine

## 2012-05-08 ENCOUNTER — Encounter (HOSPITAL_COMMUNITY): Payer: Self-pay | Admitting: Emergency Medicine

## 2012-05-08 ENCOUNTER — Ambulatory Visit (INDEPENDENT_AMBULATORY_CARE_PROVIDER_SITE_OTHER): Payer: Medicare Other | Admitting: Family Medicine

## 2012-05-08 ENCOUNTER — Encounter: Payer: Self-pay | Admitting: Family Medicine

## 2012-05-08 VITALS — BP 122/54 | HR 60 | Ht 67.0 in | Wt 242.0 lb

## 2012-05-08 DIAGNOSIS — R270 Ataxia, unspecified: Secondary | ICD-10-CM

## 2012-05-08 DIAGNOSIS — M545 Low back pain, unspecified: Secondary | ICD-10-CM

## 2012-05-08 DIAGNOSIS — T481X4A Poisoning by skeletal muscle relaxants [neuromuscular blocking agents], undetermined, initial encounter: Principal | ICD-10-CM | POA: Diagnosis present

## 2012-05-08 DIAGNOSIS — E669 Obesity, unspecified: Secondary | ICD-10-CM | POA: Diagnosis present

## 2012-05-08 DIAGNOSIS — M1710 Unilateral primary osteoarthritis, unspecified knee: Secondary | ICD-10-CM | POA: Diagnosis present

## 2012-05-08 DIAGNOSIS — M549 Dorsalgia, unspecified: Secondary | ICD-10-CM

## 2012-05-08 DIAGNOSIS — I10 Essential (primary) hypertension: Secondary | ICD-10-CM | POA: Diagnosis present

## 2012-05-08 DIAGNOSIS — E785 Hyperlipidemia, unspecified: Secondary | ICD-10-CM | POA: Diagnosis present

## 2012-05-08 DIAGNOSIS — K219 Gastro-esophageal reflux disease without esophagitis: Secondary | ICD-10-CM | POA: Diagnosis present

## 2012-05-08 DIAGNOSIS — E039 Hypothyroidism, unspecified: Secondary | ICD-10-CM | POA: Diagnosis present

## 2012-05-08 DIAGNOSIS — G589 Mononeuropathy, unspecified: Secondary | ICD-10-CM | POA: Diagnosis present

## 2012-05-08 DIAGNOSIS — E876 Hypokalemia: Secondary | ICD-10-CM | POA: Diagnosis present

## 2012-05-08 DIAGNOSIS — T48201A Poisoning by unspecified drugs acting on muscles, accidental (unintentional), initial encounter: Secondary | ICD-10-CM | POA: Diagnosis present

## 2012-05-08 DIAGNOSIS — T391X1A Poisoning by 4-Aminophenol derivatives, accidental (unintentional), initial encounter: Secondary | ICD-10-CM | POA: Diagnosis present

## 2012-05-08 DIAGNOSIS — E119 Type 2 diabetes mellitus without complications: Secondary | ICD-10-CM | POA: Diagnosis present

## 2012-05-08 DIAGNOSIS — IMO0002 Reserved for concepts with insufficient information to code with codable children: Secondary | ICD-10-CM | POA: Diagnosis present

## 2012-05-08 DIAGNOSIS — R252 Cramp and spasm: Secondary | ICD-10-CM

## 2012-05-08 DIAGNOSIS — X58XXXA Exposure to other specified factors, initial encounter: Secondary | ICD-10-CM | POA: Diagnosis present

## 2012-05-08 DIAGNOSIS — R4182 Altered mental status, unspecified: Secondary | ICD-10-CM | POA: Diagnosis present

## 2012-05-08 DIAGNOSIS — Y92009 Unspecified place in unspecified non-institutional (private) residence as the place of occurrence of the external cause: Secondary | ICD-10-CM

## 2012-05-08 LAB — BASIC METABOLIC PANEL
BUN: 13 mg/dL (ref 6–23)
CO2: 32 mEq/L (ref 19–32)
Calcium: 9.5 mg/dL (ref 8.4–10.5)
Chloride: 97 mEq/L (ref 96–112)
Creat: 1.02 mg/dL (ref 0.50–1.10)
Glucose, Bld: 67 mg/dL — ABNORMAL LOW (ref 70–99)
Potassium: 3.1 mEq/L — ABNORMAL LOW (ref 3.5–5.3)
Sodium: 139 mEq/L (ref 135–145)

## 2012-05-08 MED ORDER — NALOXONE HCL 1 MG/ML IJ SOLN
1.0000 mg | Freq: Once | INTRAMUSCULAR | Status: AC
  Administered 2012-05-09: 1 mg via INTRAVENOUS
  Filled 2012-05-08: qty 2

## 2012-05-08 MED ORDER — KETOROLAC TROMETHAMINE 30 MG/ML IJ SOLN
30.0000 mg | Freq: Once | INTRAMUSCULAR | Status: AC
  Administered 2012-05-09: 30 mg via INTRAVENOUS
  Filled 2012-05-08: qty 1

## 2012-05-08 MED ORDER — GABAPENTIN 300 MG PO CAPS: 300.0000 mg | ORAL_CAPSULE | Freq: Three times a day (TID) | ORAL | Status: DC

## 2012-05-08 NOTE — Patient Instructions (Addendum)
CONTINUE the muscle relaxant (baclofen) every 8 hours (three times a day) for the next 2 days, then as needed CONTINUE Norco/Vicodin as needed   Take warm baths or use warm compresses on your back  Keep your follow-up appointment with Dr. Tye Savoy on Tuesday

## 2012-05-08 NOTE — Progress Notes (Signed)
  Subjective:    Patient ID: Kaitlyn Ferguson, female    DOB: October 06, 1945, 66 y.o.   MRN: [id number]  HPI # Follow-up right-sided back spasms She misinterpreted the telephone instructions and took 2 tablets of Robaxin and 2 tablets of Vicodin this morning around 8:00 am, so she is a little lethargic this morning. Her son drove her today.  She reports no improvement of 1 tablet of Robaxin and Vicodin at a time.  Her back spasms are unchanged.  ROS: complaining of right thigh tingling and occasional numbness with the spasms; denies leg weakness  She reports flexeril which she takes for her cervical radiculopathy is not helping, even when she takes 900 mg at a time  Review of Systems Per HPI  Allergies, medication, past medical history reviewed.  Significant for: -HHistory of ruptured disc 20 years ago s/p surgery. Following surgery, her back pain has not been significant. Her current episode is the worse it has been    Objective:   Physical Exam GEN: she had a spasm of back pain; she appeared very uncomfortable; overweight PSYCH: alert and oriented and engaged but appears lethargic PULM: NI WOB ABD: obese BACK:    No obvious visual abnormalities   During spasm of pain, standing up helped (she was sitting down and had moved her legs when she had the spasms)   Significant TTP right sacroiliac region   GAIT: antalgic, favoring left side   Motor: 5/5 leg strength bilaterally    Assessment & Plan:

## 2012-05-08 NOTE — Assessment & Plan Note (Signed)
Persistent back spasms. One was witnessed during the physical examination today. I suspect she is also have sciatic pain due to the muscle spasm -Robaxin scheduled for the next couple of days -Continue Vicodin as needed -Considered increasing Flexeril, but patient reports she already tried (up to 900 mg at a time), but it did not help -Warm compresses (this may be helping her the most right now) -Follow-up in 1 week or prn

## 2012-05-08 NOTE — ED Notes (Signed)
PT. ARRIVED WITH EMS FROM HOME REPORTS RIGHT LOWER BACK PAIN SINCE Monday THIS WEEK , PT. REPORTS MUSCLE STRAIN WHILE EXERCISING UNRELIEVED BY NORCO AND BACLOFEN , FAMILY IS CONCERNED THAT PT. IS DROWSY/SLEEPY DUE MEDICATIONS , PT. SOMNOLENT/SLEEPY AT ARRIVAL . CBG = 106.

## 2012-05-09 ENCOUNTER — Emergency Department (HOSPITAL_COMMUNITY): Payer: Medicare Other

## 2012-05-09 ENCOUNTER — Encounter (HOSPITAL_COMMUNITY): Payer: Self-pay

## 2012-05-09 ENCOUNTER — Other Ambulatory Visit: Payer: Self-pay

## 2012-05-09 DIAGNOSIS — E876 Hypokalemia: Secondary | ICD-10-CM | POA: Diagnosis present

## 2012-05-09 DIAGNOSIS — F05 Delirium due to known physiological condition: Secondary | ICD-10-CM

## 2012-05-09 DIAGNOSIS — R4182 Altered mental status, unspecified: Secondary | ICD-10-CM | POA: Diagnosis present

## 2012-05-09 DIAGNOSIS — IMO0002 Reserved for concepts with insufficient information to code with codable children: Secondary | ICD-10-CM

## 2012-05-09 LAB — CBC
HCT: 36.7 % (ref 36.0–46.0)
HCT: 37.5 % (ref 36.0–46.0)
Hemoglobin: 11.9 g/dL — ABNORMAL LOW (ref 12.0–15.0)
Hemoglobin: 12.3 g/dL (ref 12.0–15.0)
MCH: 25.8 pg — ABNORMAL LOW (ref 26.0–34.0)
MCH: 26.3 pg (ref 26.0–34.0)
MCHC: 32.4 g/dL (ref 30.0–36.0)
MCHC: 32.8 g/dL (ref 30.0–36.0)
MCV: 79.4 fL (ref 78.0–100.0)
MCV: 80.1 fL (ref 78.0–100.0)
Platelets: 242 10*3/uL (ref 150–400)
Platelets: 268 10*3/uL (ref 150–400)
RBC: 4.62 MIL/uL (ref 3.87–5.11)
RBC: 4.68 MIL/uL (ref 3.87–5.11)
RDW: 14.4 % (ref 11.5–15.5)
RDW: 14.6 % (ref 11.5–15.5)
WBC: 9.8 10*3/uL (ref 4.0–10.5)
WBC: 9.8 10*3/uL (ref 4.0–10.5)

## 2012-05-09 LAB — GLUCOSE, CAPILLARY
Glucose-Capillary: 150 mg/dL — ABNORMAL HIGH (ref 70–99)
Glucose-Capillary: 112 mg/dL — ABNORMAL HIGH (ref 70–99)
Glucose-Capillary: 118 mg/dL — ABNORMAL HIGH (ref 70–99)
Glucose-Capillary: 119 mg/dL — ABNORMAL HIGH (ref 70–99)

## 2012-05-09 LAB — BASIC METABOLIC PANEL
BUN: 14 mg/dL (ref 6–23)
CO2: 29 mEq/L (ref 19–32)
Calcium: 9.7 mg/dL (ref 8.4–10.5)
Chloride: 98 mEq/L (ref 96–112)
Creatinine, Ser: 0.83 mg/dL (ref 0.50–1.10)
GFR calc Af Amer: 83 mL/min — ABNORMAL LOW (ref >90)
GFR calc non Af Amer: 72 mL/min — ABNORMAL LOW (ref >90)
Glucose, Bld: 160 mg/dL — ABNORMAL HIGH (ref 70–99)
Potassium: 3 mEq/L — ABNORMAL LOW (ref 3.5–5.1)
Sodium: 140 mEq/L (ref 135–145)

## 2012-05-09 LAB — CREATININE, SERUM
Creatinine, Ser: 0.9 mg/dL (ref 0.50–1.10)
GFR calc Af Amer: 76 mL/min — ABNORMAL LOW (ref >90)
GFR calc non Af Amer: 65 mL/min — ABNORMAL LOW (ref >90)

## 2012-05-09 LAB — ACETAMINOPHEN LEVEL: Acetaminophen (Tylenol), Serum: <15 ug/mL (ref 10–30)

## 2012-05-09 MED ORDER — ACETAMINOPHEN 650 MG RE SUPP: 650.0000 mg | Freq: Four times a day (QID) | RECTAL | Status: DC | PRN

## 2012-05-09 MED ORDER — LISINOPRIL 20 MG PO TABS
20.0000 mg | ORAL_TABLET | Freq: Every day | ORAL | Status: DC
  Administered 2012-05-09 – 2012-05-10 (×2): 20 mg via ORAL
  Filled 2012-05-09 (×2): qty 1

## 2012-05-09 MED ORDER — POTASSIUM CHLORIDE 10 MEQ/100ML IV SOLN
10.0000 meq | INTRAVENOUS | Status: AC
  Administered 2012-05-09 – 2012-05-10 (×3): 10 meq via INTRAVENOUS
  Filled 2012-05-09 (×3): qty 100

## 2012-05-09 MED ORDER — LISINOPRIL-HYDROCHLOROTHIAZIDE 20-25 MG PO TABS: 1.0000 | ORAL_TABLET | Freq: Every day | ORAL | Status: DC

## 2012-05-09 MED ORDER — ACETAMINOPHEN 325 MG PO TABS
650.0000 mg | ORAL_TABLET | Freq: Four times a day (QID) | ORAL | Status: DC | PRN
  Administered 2012-05-09: 650 mg via ORAL
  Filled 2012-05-09: qty 2

## 2012-05-09 MED ORDER — PANTOPRAZOLE SODIUM 40 MG PO TBEC
40.0000 mg | DELAYED_RELEASE_TABLET | Freq: Every day | ORAL | Status: DC
  Administered 2012-05-09 – 2012-05-10 (×2): 40 mg via ORAL
  Filled 2012-05-09 (×2): qty 1

## 2012-05-09 MED ORDER — LEVOTHYROXINE SODIUM 112 MCG PO TABS
112.0000 ug | ORAL_TABLET | Freq: Every day | ORAL | Status: DC
  Administered 2012-05-09 – 2012-05-10 (×2): 112 ug via ORAL
  Filled 2012-05-09 (×3): qty 1

## 2012-05-09 MED ORDER — ENOXAPARIN SODIUM 40 MG/0.4ML ~~LOC~~ SOLN
40.0000 mg | SUBCUTANEOUS | Status: DC
  Administered 2012-05-09 – 2012-05-10 (×2): 40 mg via SUBCUTANEOUS
  Filled 2012-05-09 (×2): qty 0.4

## 2012-05-09 MED ORDER — HYDROCHLOROTHIAZIDE 25 MG PO TABS
25.0000 mg | ORAL_TABLET | Freq: Every day | ORAL | Status: DC
  Administered 2012-05-09 – 2012-05-10 (×2): 25 mg via ORAL
  Filled 2012-05-09 (×2): qty 1

## 2012-05-09 MED ORDER — ASPIRIN 81 MG PO TABS: 81.0000 mg | ORAL_TABLET | Freq: Every day | ORAL | Status: DC

## 2012-05-09 MED ORDER — SODIUM CHLORIDE 0.9 % IV SOLN
INTRAVENOUS | Status: DC
  Administered 2012-05-09: 12:00:00 via INTRAVENOUS

## 2012-05-09 MED ORDER — POTASSIUM CHLORIDE 10 MEQ/100ML IV SOLN
10.0000 meq | INTRAVENOUS | Status: AC
  Administered 2012-05-09 (×2): 10 meq via INTRAVENOUS
  Filled 2012-05-09 (×5): qty 100

## 2012-05-09 MED ORDER — ASPIRIN EC 81 MG PO TBEC
81.0000 mg | DELAYED_RELEASE_TABLET | Freq: Every day | ORAL | Status: DC
  Administered 2012-05-10: 81 mg via ORAL
  Filled 2012-05-09 (×3): qty 1

## 2012-05-09 NOTE — H&P (Signed)
Seen and examined.  Chart reviewed.  Discussed with Dr. Clinton Sawyer.  Agree with his admit, management and documentation.  Briefly 66 yo female with altered mental status.  Earlier this week, she "pulled a muscle" in her back for the first time.  Seen in [facility] and given narcotic analgesics and muscle relaxants.  She may have taken more than prescribed (unintentionally).  She is now intoxicated.  I see no further WU needed at this time - provided her mental status clears in a time frame compatible with the drug half life.

## 2012-05-09 NOTE — ED Provider Notes (Signed)
8:56 AM  Date: 05/09/2012  Rate: 58  Rhythm: normal sinus rhythm and premature atrial contractions (PAC)  QRS Axis: normal  Intervals: normal  ST/T Wave abnormalities: nonspecific T wave changes  Conduction Disutrbances:nonspecific intraventricular conduction delay  Narrative Interpretation: Abnormal EKG  Old EKG Reviewed: unchanged    Carleene Cooper III, MD 05/09/12 [phone]

## 2012-05-09 NOTE — ED Notes (Signed)
Patient is resting comfortably. Family at bedside.  

## 2012-05-09 NOTE — ED Notes (Signed)
Attempted to call report

## 2012-05-09 NOTE — ED Notes (Signed)
Pt was able to ambulate with assistance informed RN

## 2012-05-09 NOTE — ED Notes (Signed)
Bed received, awaiting orders at this time.  Pt sleeping, family at bedside

## 2012-05-09 NOTE — H&P (Signed)
Family Medicine Teaching [facility] Admission History and Physical  Patient name: Kaitlyn Ferguson Medical record number: [id number] Date of birth: Feb 11, 1946 Age: 66 y.o. Gender: female  Primary Care Provider: DE LA CRUZ,IVY, DO  Chief Complaint: Altered Mental Status History of Present Illness: Kaitlyn Ferguson is a 66 y.o. year old female presenting with altered mental status. She has been experiencing severe right sided back pain over the last 6 days presumed secondary to muscle strain from exercise. She has been evaluated in the [facility] twice this week for the pain and prescribed vicodin and baclofen. Yesterday afternoon the patient duaghter-in-law witnessed her taking two baclofen for severe muscle cramping. Over the next several hours she became somnolent and altered, therefore her family called EMS to have her brought to the ED. In the ED, the patient was conscious and hemodynamically stable but altered. She was given Narcan 1 mg x 1 without much improvement in her symptoms. Thereafter, she was observed overnight in the ED. However, when she was re-evaluated by the EDP this morning, he was concerned about her atactic gait. A CT of the head was negative for acute findings, but he did not feel that she was appropriate for discharge. Currently, she is accompanied by her son, daughter-in-law and grandson at the bedside. They state that she is improving but not back to baseline. The patient does not remember how many Vicodin or Baclofen she took yesterday. She says she feels weak all over, but not in any particular area. She notes minimal right hip pain and rates it a 3/10. Otherwise, she denies headache, chest pain, shortness of breath.   Patient Active Problem List  Diagnosis  . HYPOTHYROIDISM  . DIABETES MELLITUS, TYPE II, CONTROLLED  . HYPERLIPIDEMIA  . OBESITY, NOS  . HYPERTENSION, BENIGN SYSTEMIC  . CHOLELITHIASIS  . ARTHRITIS, DEGENERATIVE  . Cervical radiculopathy  . Murmur    . Viral illness  . GERD (gastroesophageal reflux disease)  . Fatigue  . Sciatica of left side  . Lumbago   Past Medical History: Past Medical History  Diagnosis Date  . Arthritis   . Diabetes mellitus   . Cholelithiasis   . Hyperlipidemia   . Hypothyroidism   . Obesity   . DJD (degenerative joint disease) 10/07/2008  . Osteomyelitis of ankle     Complications of Right ankle injury  . DJD (degenerative joint disease)   . Hypertension   . Neuropathy     Past Surgical History: Past Surgical History  Procedure Date  . Abdominal hysterectomy   . Spine surgery   . Tonsillectomy     Childhood  . Neck mass excision 12/29/2001  . Breast biopsy 05/1997    Right breast (fatty necrosis-no malignancy)  . Ankle fusion 02/1996    Right ankle tib/talar  . Carpal tunnel release 1993    Right arm    Social History: History   Social History  . Marital Status: Widowed    Spouse Name: N/A    Number of Children: 6  . Years of Education: N/A   Social History Main Topics  . Smoking status: Never Smoker   . Smokeless tobacco: Never Used   Comment: No tobacco currently  . Alcohol Use: No  . Drug Use: No  . Sexually Active: None   Other Topics Concern  . None   Social History Narrative  . None    Family History: Family History  Problem Relation Age of Onset  . Diabetes Brother   . Diabetes  Sister     DM2-lost weight-now okay  . Heart attack Father     First MI- early 24's  . Stroke Mother   . Heart failure Mother   . Multiple myeloma Mother     Deceased at 28 due to same    Allergies: Allergies  Allergen Reactions  . Vancomycin Itching    Pre-medicated with benadryl and tylenol 1 hr before administration of vanc    Current Facility-Administered Medications  Medication Dose Route Frequency Provider Last Rate Last Dose  . ketorolac (TORADOL) 30 MG/ML injection 30 mg  30 mg Intravenous Once Lyanne Co, MD   30 mg at 05/09/12 0001  . naloxone [facility]  injection 1 mg  1 mg Intravenous Once Lyanne Co, MD   1 mg at 05/09/12 0000   Current Outpatient Prescriptions  Medication Sig Dispense Refill  . aspirin 81 MG tablet Take 81 mg by mouth at bedtime.       . baclofen (LIORESAL) 10 MG tablet Take 1 tablet (10 mg total) by mouth 3 (three) times daily.  30 each  1  . gabapentin (NEURONTIN) 800 MG tablet take 2 tablets by mouth twice a day  120 tablet  3  . HYDROcodone-acetaminophen (NORCO/VICODIN) 5-325 MG per tablet Take 1 tablet by mouth every 6 (six) hours as needed. For pain      . insulin glargine (LANTUS) 100 UNIT/ML injection Inject 55 Units into the skin daily.      Marland Kitchen levothyroxine (SYNTHROID, LEVOTHROID) 112 MCG tablet Take 1 tablet (112 mcg total) by mouth daily.  90 tablet  4  . Liraglutide (VICTOZA) 18 MG/3ML SOLN Inject 1.2 mg into the skin daily.      Marland Kitchen lisinopril-hydrochlorothiazide (PRINZIDE,ZESTORETIC) 20-25 MG per tablet take 1 tablet by mouth once daily  90 tablet  3  . Multiple Vitamin (MULITIVITAMIN WITH MINERALS) TABS Take 1 tablet by mouth daily.      Marland Kitchen omeprazole (PRILOSEC) 40 MG capsule Take 40 mg by mouth daily.      . rosuvastatin (CRESTOR) 20 MG tablet Take 20 mg by mouth daily.      . B-D ULTRA-FINE 33 LANCETS MISC Use as directed  100 each  11  . Insulin Syringe-Needle U-100 31G X 5/16" 1 ML MISC Use with insulin as directed.  100 each  11   Review Of Systems: Per HPI with the following additions: none Otherwise 12 point review of systems was performed and was unremarkable.  Physical Exam: BP 152/59  Pulse 68  Temp 98.4 F (36.9 C) (Oral)  Resp 16  SpO2 95% General: Elderly WF, intermittently falls asleep during the exam, while awake she is conversant with a labile affect and poor recall, she is non-distress and actually quite happy appearing HEENT: PERRLA, extra ocular movement intact and neck supple with midline trachea Heart: S1, S2 normal, no murmur, rub or gallop, regular rate and rhythm Lungs: clear  to auscultation, no wheezes or rales and unlabored breathing Abdomen: abdomen is soft without significant tenderness, masses, organomegaly or guarding Extremities: extremities normal, atraumatic, no cyanosis or edema MSK: no tenderness to palpation along her spine  Skin:no rashes Neurology: oriented x 4 when awake, no focal deficits, but cannot comply with cerebellar exam due lack of effort and intoxicated state  Labs and Imaging:  Results for orders placed during the hospital encounter of 05/08/12 (from the past 24 hour(s))  CBC     Status: Normal   Collection Time   05/09/12 12:01  AM      Component Value Range   WBC 9.8  4.0 - 10.5 K/uL   RBC 4.68  3.87 - 5.11 MIL/uL   Hemoglobin 12.3  12.0 - 15.0 g/dL   HCT 47.8  29.5 - 62.1 %   MCV 80.1  78.0 - 100.0 fL   MCH 26.3  26.0 - 34.0 pg   MCHC 32.8  30.0 - 36.0 g/dL   RDW 30.8  65.7 - 84.6 %   Platelets 242  150 - 400 K/uL  BASIC METABOLIC PANEL     Status: Abnormal   Collection Time   05/09/12 12:01 AM      Component Value Range   Sodium 140  135 - 145 mEq/L   Potassium 3.0 (*) 3.5 - 5.1 mEq/L   Chloride 98  96 - 112 mEq/L   CO2 29  19 - 32 mEq/L   Glucose, Bld 160 (*) 70 - 99 mg/dL   BUN 14  6 - 23 mg/dL   Creatinine, Ser 9.62  0.50 - 1.10 mg/dL   Calcium 9.7  8.4 - 95.2 mg/dL   GFR calc non Af Amer 72 (*) >90 mL/min   GFR calc Af Amer 83 (*) >90 mL/min  ACETAMINOPHEN LEVEL     Status: Normal   Collection Time   05/09/12 12:01 AM      Component Value Range   Acetaminophen (Tylenol), Serum <15.0  10 - 30 ug/mL  GLUCOSE, CAPILLARY     Status: Abnormal   Collection Time   05/09/12  1:47 AM      Component Value Range   Glucose-Capillary 150 (*) 70 - 99 mg/dL    Ct Head Wo Contrast  05/09/2012  *RADIOLOGY REPORT*  Clinical Data: 66 year old female ataxia.  Decreased mental status.  CT HEAD WITHOUT CONTRAST  Technique:  Contiguous axial images were obtained from the base of the skull through the vertex without contrast.   Comparison: Cervical radiographs 11/07/2010.  Findings: Visualized paranasal sinuses and mastoids are clear. Hyperostosis of the calvarium. No acute osseous abnormality identified.  Visualized orbits and scalp soft tissues are within normal limits.  Chronic posterior left MCA linear area of encephalomalacia.  Normal cerebral volume elsewhere.  No ventriculomegaly. No midline shift, mass effect, or evidence of mass lesion.  No acute intracranial hemorrhage identified.  No evidence of cortically based acute infarction identified.  No suspicious intracranial vascular hyperdensity.  IMPRESSION: Small chronic posterior left MCA infarct.  Otherwise negative noncontrast CT appearance of the brain.   Original Report Authenticated By: Harley Hallmark, M.D.     Date: 05/09/2012  Rate: 55  Rhythm: normal sinus rhythm  QRS Axis: indeterminate  Intervals: borderline prolonged QRS (106 ms)  ST/T Wave abnormalities: nonspecific T wave changes, TWI in inferior leads  Conduction Disutrbances:nonspecific intraventricular conduction delay  Narrative Interpretation: no dysrhythmia or STEMI  Old EKG Reviewed: unchanged - stable TWI in inferior leads       Assessment and Plan: DAHLILA HRIVNAK is a 66 y.o. year old female presenting with altered mental status likely secondary to polypharmacy.   1. Altered Mental Status - Based on the patient's history of recent polypharmacy with baclofen and vicodin for back pain control, it is highly unlikely that she also has a secondary cause such as CVA, cardiac or pulmonary. All of her vital are stable, CBG is normal, no severe metabolic derangements were noted and ECG and CT scan of the head were non diagnostic. Therefore, I am not  inclined to perform any other studies at this point.  - observe until effects of baclofen have warn off - If patient symptoms persist for > 12 hours, repeat CMET, ammonia, and consider brain MRI  2. Back Pain - Secondary to muscle strain. The  patient has achieved pain control, but unfortunately with secondary consequences.  - Hold all narcotics and muscle relaxants - Give tylenol PRN and consider lidoderm patch   3. Diabetes, Type 2 - Hold insulin and Vicotza at this point since patient will be NPO until AMS has improved - check CBG TID  4. Neuropathy - Hold neurontin as is can cause somnolence as well  5. Hypokalemia - possibly secondary to taking insulin yesterday without eating - replete with 10 mEq IV x 5  6. HTN - continue home lisinopril-HCTZ   7. HLD - hold crestor  8. Hypothyroidism - continue home synthroid  9. FENGI - Hold NPO, given NS IVF @ 125 mL/hr  10. PPX - Lovenox daily  11. Code - Full  12. Disposition - Admit to FMT[facility] under Dr. Leveda Anna, likely d/c tomorrow if improved mental status

## 2012-05-09 NOTE — ED Notes (Signed)
MD ambulated pt in hall. Pt states she feels unsteady when standing.

## 2012-05-09 NOTE — ED Provider Notes (Signed)
History     CSN: [id number]  Arrival date & time 05/08/12  2313   First MD Initiated Contact with Patient 05/08/12 2315      Chief Complaint  Patient presents with  . Back Pain    The history is provided by the patient and a relative.   patient reports 6 days of worsening right low back pain since doing abdominal exercises.  She reports she's taking hydrocodone and baclofen for this pain and it does not seem to be helping.  Family reports that she took 2 baclofen went to bed this evening.  They do not know how much of her hydrocodone she took.  At some point this evening they noted that the patient got up to go the bathroom and seemed extremely somnolent and sleepy and lethargic and and thus called 911 as they're concerned that patient may have taken too much of her medications.  No recent fevers.   Past Medical History  Diagnosis Date  . Arthritis   . Diabetes mellitus   . Cholelithiasis   . Hyperlipidemia   . Hypothyroidism   . Obesity   . DJD (degenerative joint disease) 10/07/2008  . Osteomyelitis of ankle     Complications of Right ankle injury  . DJD (degenerative joint disease)   . Hypertension   . Neuropathy     Past Surgical History  Procedure Date  . Abdominal hysterectomy   . Spine surgery   . Tonsillectomy     Childhood  . Neck mass excision 12/29/2001  . Breast biopsy 05/1997    Right breast (fatty necrosis-no malignancy)  . Ankle fusion 02/1996    Right ankle tib/talar  . Carpal tunnel release 1993    Right arm    Family History  Problem Relation Age of Onset  . Diabetes Brother   . Diabetes Sister     DM2-lost weight-now okay  . Heart attack Father     First MI- early 70's  . Stroke Mother   . Heart failure Mother   . Multiple myeloma Mother     Deceased at 55 due to same    History  Substance Use Topics  . Smoking status: Never Smoker   . Smokeless tobacco: Never Used   Comment: No tobacco currently  . Alcohol Use: No    OB History      Grav Para Term Preterm Abortions TAB SAB Ect Mult Living                  Review of Systems  Musculoskeletal: Positive for back pain.  All other systems reviewed and are negative.    Allergies  Vancomycin  Home Medications   Current Outpatient Rx  Name Route Sig Dispense Refill  . ASPIRIN 81 MG PO TABS Oral Take 81 mg by mouth at bedtime.     Marland Kitchen BACLOFEN 10 MG PO TABS Oral Take 1 tablet (10 mg total) by mouth 3 (three) times daily. 30 each 1  . GABAPENTIN 800 MG PO TABS  take 2 tablets by mouth twice a day 120 tablet 3  . HYDROCODONE-ACETAMINOPHEN 5-325 MG PO TABS Oral Take 1 tablet by mouth every 6 (six) hours as needed. For pain    . INSULIN GLARGINE 100 UNIT/ML [place] SOLN Subcutaneous Inject 55 Units into the skin daily.    Marland Kitchen LEVOTHYROXINE SODIUM 112 MCG PO TABS Oral Take 1 tablet (112 mcg total) by mouth daily. 90 tablet 4  . LIRAGLUTIDE 18 MG/3ML [place] SOLN Subcutaneous Inject  1.2 mg into the skin daily.    Marland Kitchen LISINOPRIL-HYDROCHLOROTHIAZIDE 20-25 MG PO TABS  take 1 tablet by mouth once daily 90 tablet 3  . ADULT MULTIVITAMIN W/MINERALS CH Oral Take 1 tablet by mouth daily.    Marland Kitchen OMEPRAZOLE 40 MG PO CPDR Oral Take 40 mg by mouth daily.    Marland Kitchen ROSUVASTATIN CALCIUM 20 MG PO TABS Oral Take 20 mg by mouth daily.    . BD LANCET ULTRAFINE 33G MISC  Use as directed 100 each 11  . INSULIN SYRINGE-NEEDLE U-100 31G X 5/16" 1 ML MISC  Use with insulin as directed. 100 each 11    BP 123/53  Pulse 62  Temp 98.5 F (36.9 C) (Oral)  Resp 20  SpO2 97%  Physical Exam  Nursing note and vitals reviewed. Constitutional: She is oriented to person, place, and time. She appears well-developed and well-nourished. No distress.  HENT:  Head: Normocephalic and atraumatic.  Eyes: EOM are normal.  Neck: Normal range of motion.  Cardiovascular: Normal rate, regular rhythm and normal heart sounds.   Pulmonary/Chest: Effort normal and breath sounds normal.  Abdominal: Soft. She exhibits no distension.  There is no tenderness.  Musculoskeletal: Normal range of motion.       No thoracic or lumbar spinal tenderness.  Patient with paralumbar tenderness on the left.  No rash noted.  Neurological: She is alert and oriented to person, place, and time.       Awakens easily to voice.  Follow simple commands.  She does go back off to sleep after talking to her.  5 out of 5 strength of bilateral upper lower extremity major muscle groups  Skin: Skin is warm and dry.  Psychiatric: She has a normal mood and affect. Judgment normal.    ED Course  Procedures (including critical care time)  Labs Reviewed  BASIC METABOLIC PANEL - Abnormal; Notable for the following:    Potassium 3.0 (*)     Glucose, Bld 160 (*)     GFR calc non Af Amer 72 (*)     GFR calc Af Amer 83 (*)     All other components within normal limits  GLUCOSE, CAPILLARY - Abnormal; Notable for the following:    Glucose-Capillary 150 (*)     All other components within normal limits  CBC  ACETAMINOPHEN LEVEL   Ct Head Wo Contrast  05/09/2012  *RADIOLOGY REPORT*  Clinical Data: 66 year old female ataxia.  Decreased mental status.  CT HEAD WITHOUT CONTRAST  Technique:  Contiguous axial images were obtained from the base of the skull through the vertex without contrast.  Comparison: Cervical radiographs 11/07/2010.  Findings: Visualized paranasal sinuses and mastoids are clear. Hyperostosis of the calvarium. No acute osseous abnormality identified.  Visualized orbits and scalp soft tissues are within normal limits.  Chronic posterior left MCA linear area of encephalomalacia.  Normal cerebral volume elsewhere.  No ventriculomegaly. No midline shift, mass effect, or evidence of mass lesion.  No acute intracranial hemorrhage identified.  No evidence of cortically based acute infarction identified.  No suspicious intracranial vascular hyperdensity.  IMPRESSION: Small chronic posterior left MCA infarct.  Otherwise negative noncontrast CT appearance  of the brain.   Original Report Authenticated By: Harley Hallmark, M.D.     I personally reviewed the imaging tests through PACS system  I reviewed available ER/hospitalization records thought the EMR   No diagnosis found.    MDM  I suspected that the majority of the patient's altered mental status  was secondary to medication overuse with her hydrocodone and her baclofen.  The patient was observed in the emergency department throughout the night.  Family reports that her mental status is much better however when I got the patient up to walk her she is very ataxic.  She reports that balance is been a new issue for over the past 24 hours but this is unrelated to her back pain which been present for about 6 days.  I'm concerned this could represent central process and therefore a CT scan was obtained that demonstrates no acute abnormalities however she does have findings to suggest a small chronic posterior left MCA infarct.  The patient will be admitted to the family practice team for further evaluation of her ataxia and management of her back pain.  Her back pain seems musculoskeletal in nature.        Lyanne Co, MD 05/09/12 [id number]

## 2012-05-10 LAB — CBC
HCT: 37.8 % (ref 36.0–46.0)
Hemoglobin: 12.2 g/dL (ref 12.0–15.0)
MCH: 25.8 pg — ABNORMAL LOW (ref 26.0–34.0)
MCHC: 32.3 g/dL (ref 30.0–36.0)
MCV: 80.1 fL (ref 78.0–100.0)
Platelets: 285 10*3/uL (ref 150–400)
RBC: 4.72 MIL/uL (ref 3.87–5.11)
RDW: 14.3 % (ref 11.5–15.5)
WBC: 9.2 10*3/uL (ref 4.0–10.5)

## 2012-05-10 LAB — BASIC METABOLIC PANEL
BUN: 19 mg/dL (ref 6–23)
CO2: 32 mEq/L (ref 19–32)
Calcium: 10 mg/dL (ref 8.4–10.5)
Chloride: 100 mEq/L (ref 96–112)
Creatinine, Ser: 0.85 mg/dL (ref 0.50–1.10)
GFR calc Af Amer: 81 mL/min — ABNORMAL LOW (ref >90)
GFR calc non Af Amer: 70 mL/min — ABNORMAL LOW (ref >90)
Glucose, Bld: 127 mg/dL — ABNORMAL HIGH (ref 70–99)
Potassium: 3.8 mEq/L (ref 3.5–5.1)
Sodium: 141 mEq/L (ref 135–145)

## 2012-05-10 LAB — GLUCOSE, CAPILLARY
Glucose-Capillary: 97 mg/dL (ref 70–99)
Glucose-Capillary: 114 mg/dL — ABNORMAL HIGH (ref 70–99)
Glucose-Capillary: 154 mg/dL — ABNORMAL HIGH (ref 70–99)

## 2012-05-10 MED ORDER — BACLOFEN 10 MG PO TABS: 10.0000 mg | ORAL_TABLET | Freq: Three times a day (TID) | ORAL | Status: AC | PRN

## 2012-05-10 NOTE — Progress Notes (Signed)
Patient ID: Kaitlyn Ferguson, female   DOB: [birth year], 66 y.o.   MRN: [id number] Family Medicine Teaching Service Daily Progress Note Service Page: [phone]  Patient Assessment: 66 yo female with AMS and ataxia after taking numerous vicodin and baclofen for back pain on 9/13  Subjective: Patient states doing well. No complaints this morning other than left leg pain related to OA.  States she feels much better than Friday and that she is back to her baseline.  She states she won't take baclofen again and will flush the meds down the toilet as soon as she gets home.  Objective: Temp:  [97.9 F (36.6 C)-98.5 F (36.9 C)] 98.2 F (36.8 C) (09/15 0600) Pulse Rate:  [58-72] 72  (09/15 0600) Resp:  [14-18] 18  (09/15 0600) BP: (126-170)/(58-68) 158/67 mmHg (09/15 0600) SpO2:  [93 %-96 %] 96 % (09/15 0600) Weight:  [237 lb 4.8 oz (107.639 kg)-243 lb 2.7 oz (110.3 kg)] 243 lb 2.7 oz (110.3 kg) (09/15 0600) Exam: General: NAD, resting comfortably in chair Cardiovascular: rrr, no murmurs, rubs, or gallops Respiratory: CTAB, no wheezes or crackles Abdomen: Soft, NT, ND Extremities: no edema, left knee with mild edema Neuro: A&Ox4, 5/5 strength throughout, sensation to light touch intact, gait reveals limp appears related to left knee OA  I have reviewed the patient's medications, labs, imaging, and diagnostic testing.  Notable results are summarized below.  CBC BMET   Lab 05/10/12 0540 05/09/12 1150 05/09/12 0001  WBC 9.2 9.8 9.8  HGB 12.2 11.9* 12.3  HCT 37.8 36.7 37.5  PLT 285 268 242    Lab 05/10/12 0540 05/09/12 1150 05/09/12 0001 05/08/12 1122  NA 141 -- 140 139  K 3.8 -- 3.0* 3.1*  CL 100 -- 98 97  CO2 32 -- 29 32  BUN 19 -- 14 13  CREATININE 0.85 0.90 0.83 --  GLUCOSE 127* -- 160* 67*  CALCIUM 10.0 -- 9.7 9.5     Results for orders placed during the hospital encounter of 05/08/12 (from the past 24 hour(s))  GLUCOSE, CAPILLARY     Status: Abnormal   Collection Time   05/09/12 10:08 AM      Component Value Range   Glucose-Capillary 112 (*) 70 - 99 mg/dL  CBC     Status: Abnormal   Collection Time   05/09/12 11:50 AM      Component Value Range   WBC 9.8  4.0 - 10.5 K/uL   RBC 4.62  3.87 - 5.11 MIL/uL   Hemoglobin 11.9 (*) 12.0 - 15.0 g/dL   HCT 32.3  55.7 - 32.2 %   MCV 79.4  78.0 - 100.0 fL   MCH 25.8 (*) 26.0 - 34.0 pg   MCHC 32.4  30.0 - 36.0 g/dL   RDW 02.5  42.7 - 06.2 %   Platelets 268  150 - 400 K/uL  CREATININE, SERUM     Status: Abnormal   Collection Time   05/09/12 11:50 AM      Component Value Range   Creatinine, Ser 0.90  0.50 - 1.10 mg/dL   GFR calc non Af Amer 65 (*) >90 mL/min   GFR calc Af Amer 76 (*) >90 mL/min  GLUCOSE, CAPILLARY     Status: Abnormal   Collection Time   05/09/12  4:35 PM      Component Value Range   Glucose-Capillary 118 (*) 70 - 99 mg/dL  GLUCOSE, CAPILLARY     Status: Abnormal   Collection Time  05/09/12  7:52 PM      Component Value Range   Glucose-Capillary 119 (*) 70 - 99 mg/dL  GLUCOSE, CAPILLARY     Status: Normal   Collection Time   05/10/12 12:00 AM      Component Value Range   Glucose-Capillary 97  70 - 99 mg/dL  GLUCOSE, CAPILLARY     Status: Abnormal   Collection Time   05/10/12  4:45 AM      Component Value Range   Glucose-Capillary 114 (*) 70 - 99 mg/dL  BASIC METABOLIC PANEL     Status: Abnormal   Collection Time   05/10/12  5:40 AM      Component Value Range   Sodium 141  135 - 145 mEq/L   Potassium 3.8  3.5 - 5.1 mEq/L   Chloride 100  96 - 112 mEq/L   CO2 32  19 - 32 mEq/L   Glucose, Bld 127 (*) 70 - 99 mg/dL   BUN 19  6 - 23 mg/dL   Creatinine, Ser 4.09  0.50 - 1.10 mg/dL   Calcium 81.1  8.4 - 91.4 mg/dL   GFR calc non Af Amer 70 (*) >90 mL/min   GFR calc Af Amer 81 (*) >90 mL/min  CBC     Status: Abnormal   Collection Time   05/10/12  5:40 AM      Component Value Range   WBC 9.2  4.0 - 10.5 K/uL   RBC 4.72  3.87 - 5.11 MIL/uL   Hemoglobin 12.2  12.0 - 15.0 g/dL   HCT 78.2   95.6 - 21.3 %   MCV 80.1  78.0 - 100.0 fL   MCH 25.8 (*) 26.0 - 34.0 pg   MCHC 32.3  30.0 - 36.0 g/dL   RDW 08.6  57.8 - 46.9 %   Platelets 285  150 - 400 K/uL   Imaging/Diagnostic Tests: none  Plan: BILLIEJEAN SCHIMEK is a 66 y.o. year old female presenting with altered mental status likely secondary to polypharmacy.   1. Altered Mental Status - appears to be related to recent polypharmacy.  Patient now back at baseline. Vitals remain stable.  CBG normal. Normal EKG and CT head. -resolved  2. Back Pain - Secondary to muscle strain. The patient has achieved pain control, but unfortunately with secondary consequences.  - will not discharge on narcotics and muscle relaxants  - tylenol PRN for pain    3. Diabetes, Type 2: CBGs have been stable, will restart home medications on discharge   4. Neuropathy-neurontin held while hospitalized, will restart at discharge   5. Hypokalemia - possibly secondary to taking insulin yesterday without eating  - repleted with 10 mEq IV x 5  - K 3.8 today  6. HTN - continue home lisinopril-HCTZ   7. HLD - hold crestor, will restart at discharge   8. Hypothyroidism - continue home synthroid   9. FENGI - heart healthy diet, given NS IVF @ 125 mL/hr  10. PPX - Lovenox daily  11. Code - Full  12. Disposition - discharge today given that mental status is back at baseline    Marikay Alar, MD 05/10/2012, 9:23 AM

## 2012-05-10 NOTE — Discharge Summary (Signed)
Physician Discharge Summary  Patient ID: Kaitlyn Ferguson MRN: [id number] DOB: 08-20-1946 Age: 66 y.o.  Admit date: 05/08/2012 Discharge date: 05/10/2012 Admitting Physician: Sanjuana Letters, MD  PCP: DE LA CRUZ,IVY, DO  Consultants:none     Discharge Diagnosis: Altered mental status secondary to polypharmacy Principal Problem:  *Altered mental status Active Problems:  Hypokalemia    Hospital Course The day prior to admission the patients daughter-in-law witnessed her taking two baclofen for severe muscle cramping. Over the next several hours she became somnolent and altered, therefore her family called EMS to have her brought to the ED. In the ED, the patient was conscious and hemodynamically stable but altered. She was given Narcan 1 mg x 1 without much improvement in her symptoms. Thereafter, she was observed overnight in the ED. However, when she was re-evaluated by the EDP in the morning, he was concerned about her atactic gait. A CT of the head was negative for acute findings, but he did not feel that she was appropriate for discharge.  1. Altered Mental Status - appears to be related to recent polypharmacy. Workup revealed no other causes for AMS. Vitals remain stable. CBG normal. Normal EKG and CT head. At time of discharge patient was back to baseline.  2. Back Pain - Secondary to muscle strain. The patient had achieved pain control, but unfortunately with secondary consequences of AMS. Did not continue narcotics or baclofen in the hospital.  Patient with tylenol PRN for pain.   3. Diabetes, Type 2: CBGs have been stable, not on any medication while hospitalized. Will restart home medications on discharge.  4. Neuropathy-neurontin held while hospitalized due to concern for AMS. Will restart at discharge.  5. Hypokalemia - possibly secondary to taking insulin day before admission without eating. Repleted with 10 mEq IV x 5. K 3.8 today.  6. HTN - continued home  lisinopril-HCTZ   7. HLD - held crestor while in house. Will restart at discharge.   8. Hypothyroidism - continued home synthroid.  Problem List 1. AMS 2. Back pain 3. DM II 4. Neuropathy 5. Hypokalemia 6. HTN 7. HLD  8. Hypothyroidism        Discharge PE   Filed Vitals:   05/10/12 0600  BP: 158/67  Pulse: 72  Temp: 98.2 F (36.8 C)  Resp: 18   General: NAD, resting comfortably in chair  Cardiovascular: rrr, no murmurs, rubs, or gallops  Respiratory: CTAB, no wheezes or crackles  Abdomen: Soft, NT, ND  Extremities: no edema, left knee with mild edema  Neuro: A&Ox4, 5/5 strength throughout, sensation to light touch intact, gait reveals limp appears related to left knee OA   Procedures/Imaging:  Ct Head Wo Contrast  05/09/2012 MPRESSION: Small chronic posterior left MCA infarct.  Otherwise negative noncontrast CT appearance of the brain.     Labs  CBC  Lab 05/10/12 0540 05/09/12 1150 05/09/12 0001  WBC 9.2 9.8 9.8  HGB 12.2 11.9* 12.3  HCT 37.8 36.7 37.5  PLT 285 268 242   BMET  Lab 05/10/12 0540 05/09/12 1150 05/09/12 0001 05/08/12 1122  NA 141 -- 140 139  K 3.8 -- 3.0* 3.1*  CL 100 -- 98 97  CO2 32 -- 29 32  BUN 19 -- 14 13  CREATININE 0.85 0.90 0.83 --  CALCIUM 10.0 -- 9.7 9.5  PROT -- -- -- --  BILITOT -- -- -- --  ALKPHOS -- -- -- --  ALT -- -- -- --  AST -- -- -- --  GLUCOSE 127* -- 160* 67*   Results for orders placed during the hospital encounter of 05/08/12 (from the past 72 hour(s))  CBC     Status: Normal   Collection Time   05/09/12 12:01 AM      Component Value Range Comment   WBC 9.8  4.0 - 10.5 K/uL    RBC 4.68  3.87 - 5.11 MIL/uL    Hemoglobin 12.3  12.0 - 15.0 g/dL    HCT 16.1  09.6 - 04.5 %    MCV 80.1  78.0 - 100.0 fL    MCH 26.3  26.0 - 34.0 pg    MCHC 32.8  30.0 - 36.0 g/dL    RDW 40.9  81.1 - 91.4 %    Platelets 242  150 - 400 K/uL   BASIC METABOLIC PANEL     Status: Abnormal   Collection Time   05/09/12 12:01 AM       Component Value Range Comment   Sodium 140  135 - 145 mEq/L    Potassium 3.0 (*) 3.5 - 5.1 mEq/L    Chloride 98  96 - 112 mEq/L    CO2 29  19 - 32 mEq/L    Glucose, Bld 160 (*) 70 - 99 mg/dL    BUN 14  6 - 23 mg/dL    Creatinine, Ser 7.82  0.50 - 1.10 mg/dL    Calcium 9.7  8.4 - 95.6 mg/dL    GFR calc non Af Amer 72 (*) >90 mL/min    GFR calc Af Amer 83 (*) >90 mL/min   ACETAMINOPHEN LEVEL     Status: Normal   Collection Time   05/09/12 12:01 AM      Component Value Range Comment   Acetaminophen (Tylenol), Serum <15.0  10 - 30 ug/mL   GLUCOSE, CAPILLARY     Status: Abnormal   Collection Time   05/09/12  1:47 AM      Component Value Range Comment   Glucose-Capillary 150 (*) 70 - 99 mg/dL   GLUCOSE, CAPILLARY     Status: Abnormal   Collection Time   05/09/12 10:08 AM      Component Value Range Comment   Glucose-Capillary 112 (*) 70 - 99 mg/dL   CBC     Status: Abnormal   Collection Time   05/09/12 11:50 AM      Component Value Range Comment   WBC 9.8  4.0 - 10.5 K/uL    RBC 4.62  3.87 - 5.11 MIL/uL    Hemoglobin 11.9 (*) 12.0 - 15.0 g/dL    HCT 21.3  08.6 - 57.8 %    MCV 79.4  78.0 - 100.0 fL    MCH 25.8 (*) 26.0 - 34.0 pg    MCHC 32.4  30.0 - 36.0 g/dL    RDW 46.9  62.9 - 52.8 %    Platelets 268  150 - 400 K/uL   CREATININE, SERUM     Status: Abnormal   Collection Time   05/09/12 11:50 AM      Component Value Range Comment   Creatinine, Ser 0.90  0.50 - 1.10 mg/dL    GFR calc non Af Amer 65 (*) >90 mL/min    GFR calc Af Amer 76 (*) >90 mL/min   GLUCOSE, CAPILLARY     Status: Abnormal   Collection Time   05/09/12  4:35 PM      Component Value Range Comment   Glucose-Capillary 118 (*) 70 - 99 mg/dL  GLUCOSE, CAPILLARY     Status: Abnormal   Collection Time   05/09/12  7:52 PM      Component Value Range Comment   Glucose-Capillary 119 (*) 70 - 99 mg/dL   GLUCOSE, CAPILLARY     Status: Normal   Collection Time   05/10/12 12:00 AM      Component Value Range Comment    Glucose-Capillary 97  70 - 99 mg/dL   GLUCOSE, CAPILLARY     Status: Abnormal   Collection Time   05/10/12  4:45 AM      Component Value Range Comment   Glucose-Capillary 114 (*) 70 - 99 mg/dL   BASIC METABOLIC PANEL     Status: Abnormal   Collection Time   05/10/12  5:40 AM      Component Value Range Comment   Sodium 141  135 - 145 mEq/L    Potassium 3.8  3.5 - 5.1 mEq/L    Chloride 100  96 - 112 mEq/L    CO2 32  19 - 32 mEq/L    Glucose, Bld 127 (*) 70 - 99 mg/dL    BUN 19  6 - 23 mg/dL    Creatinine, Ser 1.61  0.50 - 1.10 mg/dL    Calcium 09.6  8.4 - 10.5 mg/dL    GFR calc non Af Amer 70 (*) >90 mL/min    GFR calc Af Amer 81 (*) >90 mL/min   CBC     Status: Abnormal   Collection Time   05/10/12  5:40 AM      Component Value Range Comment   WBC 9.2  4.0 - 10.5 K/uL    RBC 4.72  3.87 - 5.11 MIL/uL    Hemoglobin 12.2  12.0 - 15.0 g/dL    HCT 04.5  40.9 - 81.1 %    MCV 80.1  78.0 - 100.0 fL    MCH 25.8 (*) 26.0 - 34.0 pg    MCHC 32.3  30.0 - 36.0 g/dL    RDW 91.4  78.2 - 95.6 %    Platelets 285  150 - 400 K/uL      Patient condition at time of discharge/disposition: stable  Disposition-home   Follow up issues: 1. Left knee OA: patient complaining of pain on day of discharge with limp  Discharge follow up:  Patient is to schedule hospital follow-up with Dr. Tye Savoy within 1-2 weeks.  Discharge Instructions: Please refer to Patient Instructions section of EMR for full details.  Patient was counseled important signs and symptoms that should prompt return to medical care, changes in medications, dietary instructions, activity restrictions, and follow up appointments.  Significant instructions noted below:  Discharge Orders    Future Appointments: Provider: Department: Dept Phone: Center:   05/12/2012 2:45 PM Barnabas Lister, DO [facility] [phone] [facility]     Discharge Medications   Medication List     As of 05/10/2012 11:31 AM    CHANGE how you take these  medications         baclofen 10 MG tablet   Commonly known as: LIORESAL   Take 1 tablet (10 mg total) by mouth 3 (three) times daily as needed (for muscle spasm).   What changed: - how often to take the med - reasons to take the med      CONTINUE taking these medications         aspirin 81 MG tablet      B-D ULTRA-FINE 33  LANCETS Misc   Use as directed      gabapentin 800 MG tablet   Commonly known as: NEURONTIN   take 2 tablets by mouth twice a day      insulin glargine 100 UNIT/ML injection   Commonly known as: LANTUS      Insulin Syringe-Needle U-100 31G X 5/16" 1 ML Misc   Use with insulin as directed.      levothyroxine 112 MCG tablet   Commonly known as: SYNTHROID, LEVOTHROID   Take 1 tablet (112 mcg total) by mouth daily.      lisinopril-hydrochlorothiazide 20-25 MG per tablet   Commonly known as: PRINZIDE,ZESTORETIC   take 1 tablet by mouth once daily      multivitamin with minerals Tabs      omeprazole 40 MG capsule   Commonly known as: PRILOSEC      rosuvastatin 20 MG tablet   Commonly known as: CRESTOR      VICTOZA 18 MG/3ML Soln   Generic drug: Liraglutide      STOP taking these medications         HYDROcodone-acetaminophen 5-325 MG per tablet   Commonly known as: NORCO/VICODIN          Where to get your medications    These are the prescriptions that you need to pick up. We sent them to a specific pharmacy, so you will need to go there to get them.   [id number] [address], [place] - [address]    [address] [place] [address]    Phone: [phone]        baclofen 10 MG tablet           Marikay Alar, MD of [facility] 05/10/2012 10:56 AM

## 2012-05-10 NOTE — Progress Notes (Signed)
Seen and examined.  Patient is back to normal this morning.  OK to DC.

## 2012-05-11 NOTE — Discharge Summary (Signed)
Seen and examined on 9/15.  Agree with DC as outlined by Dr. Birdie Sons

## 2012-05-12 ENCOUNTER — Encounter: Payer: Self-pay | Admitting: Family Medicine

## 2012-05-12 ENCOUNTER — Ambulatory Visit (INDEPENDENT_AMBULATORY_CARE_PROVIDER_SITE_OTHER): Payer: Medicare Other | Admitting: Family Medicine

## 2012-05-12 VITALS — BP 135/64 | HR 74 | Temp 98.0°F | Ht 67.0 in | Wt 236.0 lb

## 2012-05-12 DIAGNOSIS — M545 Low back pain, unspecified: Secondary | ICD-10-CM

## 2012-05-12 DIAGNOSIS — E119 Type 2 diabetes mellitus without complications: Secondary | ICD-10-CM

## 2012-05-12 DIAGNOSIS — Z23 Encounter for immunization: Secondary | ICD-10-CM

## 2012-05-12 LAB — POCT GLYCOSYLATED HEMOGLOBIN (HGB A1C): Hemoglobin A1C: 6.5

## 2012-05-12 NOTE — Progress Notes (Signed)
  Subjective:    Patient ID: Kaitlyn Ferguson, female    DOB: [birth year], 66 y.o.   MRN: [id number]  HPI  Patient presents to clinic for hospital follow up and diabetes management.  Altered mental status: Patient was recently hospitalized over the weekend for altered mental status likely secondary to oversedation.  She was seen in the office twice this month for low back pain and sciatica.   Patient unintentionally took too much baclofen.  She says that her pills are unlabeled, because she mixes them together.  She cannot remember if she took too much baclofen or Vicodin.  Nevertheless, she says "I was acting foolish" and was taken to the hospital.  She also takes gabapentin which causes drowsiness. Patient continues to complain of muscle spasms, mostly in her neck.  She understands that she should only be taking one baclofen every 8 hours.  She says she will stop taking gabapentin and Vicodin for now.   Diabetes, type II: Patient says her blood sugars have been well controlled for the last 3 months. According to her meter, her average blood sugar is 120. Her lowest blood glucose was 56 and her highest was 212.  She continues to lose weight by lifting weights, aerobic exercise, and adhering to modified carb diet.  She denies any symptoms when her blood glucose is less than 80.  However, she knows to eat candy or drink juice when this happens.  Patient denies any headache, dizziness, lightheadedness, nausea or vomiting, blurry vision.  She also goes to pharmacy clinic and will make an appointment this month. Current regimen: victoza 1.2 daily and Lantus 35 units daily.    Review of Systems  Per history of present illness     Objective:   Physical Exam  Constitutional: No distress.  Neck: Normal range of motion. Neck supple.  Cardiovascular: Normal rate and normal heart sounds.   No murmur heard. Pulmonary/Chest: Effort normal and breath sounds normal. She has no wheezes. She has no rales.    Neurological: She is alert. No cranial nerve deficit.  Skin: Skin is warm and dry.       Normal sensation, normal pinprick sensation, no open wounds, ulcers, calluses          Assessment & Plan:

## 2012-05-12 NOTE — Assessment & Plan Note (Signed)
For muscle spasms, advised patient to take baclofen one tablet every 8 hours as needed. Told patient to avoid Vicodin and gabapentin for now, while she is taking baclofen. If baclofen causes drowsiness or altered mental status again, she has discontinued this and call M.D.

## 2012-05-12 NOTE — Patient Instructions (Addendum)
Your A1c today is 6.5.  Congratulations, Kaitlyn Ferguson! Keep up the good work. Schedule an appointment with Dr. Raymondo Band in 1 to 2 weeks. For muscle spasms, take Baclofen 1 tablet every 8 hours as needed for pain. DO NOT take with Vicodin or Gabapentin. This can cause drowsiness. Schedule follow up appointment with me in 3 weeks.

## 2012-05-12 NOTE — Assessment & Plan Note (Signed)
A1c today is 6.5. Last A1c was 7.5. Patient continues to modify her lifestyle by dieting and working out. Because A1c at goal today, will continue current regimen. If continues to lose weight, we may be able to back on Lantus. Patient to followup with Dr. Raymondo Band in 1-2 weeks. Follow up with me in 3 months or sooner as needed.

## 2012-05-22 ENCOUNTER — Ambulatory Visit (INDEPENDENT_AMBULATORY_CARE_PROVIDER_SITE_OTHER): Payer: Medicare Other | Admitting: Pharmacist

## 2012-05-22 ENCOUNTER — Encounter: Payer: Self-pay | Admitting: Pharmacist

## 2012-05-22 VITALS — BP 115/77 | HR 68 | Ht 67.0 in | Wt 240.4 lb

## 2012-05-22 DIAGNOSIS — E119 Type 2 diabetes mellitus without complications: Secondary | ICD-10-CM

## 2012-05-22 NOTE — Progress Notes (Signed)
  Subjective:    Patient ID: Kaitlyn Ferguson, female    DOB: 27-Aug-1945, 66 y.o.   MRN: [id number]  HPI Patient arrives in good spirits for diabetes management follow up.  She reports adherence to all medications and is currently taking Lantus 55 units daily.  She also brought a blood glucose log.  Patient self-reported weight loss goal of 175 lbs.   Review of Systems     Objective:   Physical Exam        Assessment & Plan:   Diabetes of many yrs duration currently under good control of blood glucose based on  . Lab Results  Component Value Date   HGBA1C 6.5 05/12/2012     ,home fasting CBG readings of 91-147 and random CBG readings of 56-219.  Denies hypoglycemic events requiring intervention.  However, she reports feeling dizzy when standing quickly a few times over the past month.  Able to verbalize appropriate hypoglycemia management plan. Decreased dose of basal insulin Lantus (insulin glargine) from 55 units to 50 units daily.  Encouraged patient to continue exercising.  May consider decreasing her lisinopril/HCTZ dose in the future if future BP readings remain <120. Written patient instructions provided.  Follow up in  Pharmacist Clinic Visit in late October/early November.   Total time in face to face counseling 20 minutes.  Patient seen with Allene Dillon, PharmD Candidate and Doris Cheadle, PharmD, Pharmacy Resident. Marland Kitchen

## 2012-05-22 NOTE — Progress Notes (Signed)
Patient ID: Kaitlyn Ferguson, female   DOB: May 19, 1946, 66 y.o.   MRN: [id number] Reviewed and agree with Dr. Macky Lower management and documentation.

## 2012-05-22 NOTE — Patient Instructions (Addendum)
You're doing great!  Decrease your Lantus to 50 units daily.  Continue with your diet and exercise plan.  Follow up with Dr. Raymondo Band late October.

## 2012-05-22 NOTE — Assessment & Plan Note (Signed)
Diabetes of many yrs duration currently under good control of blood glucose based on  . Lab Results  Component Value Date   HGBA1C 6.5 05/12/2012     ,home fasting CBG readings of 91-147 and random CBG readings of 56-219.  Denies hypoglycemic events requiring intervention.  However, she reports feeling dizzy when standing quickly a few times over the past month.  Able to verbalize appropriate hypoglycemia management plan. Decreased dose of basal insulin Lantus (insulin glargine) from 55 units to 50 units daily.  Encouraged patient to continue exercising.  May consider decreasing her lisinopril/HCTZ dose in the future if future BP readings remain <120. Written patient instructions provided.  Follow up in  Pharmacist Clinic Visit in late October/early November.   Total time in face to face counseling 20 minutes.  Patient seen with Allene Dillon, PharmD Candidate and Doris Cheadle, PharmD, Pharmacy Resident.

## 2012-06-18 ENCOUNTER — Ambulatory Visit (INDEPENDENT_AMBULATORY_CARE_PROVIDER_SITE_OTHER): Payer: Medicare Other | Admitting: Pharmacist

## 2012-06-18 ENCOUNTER — Encounter: Payer: Self-pay | Admitting: Pharmacist

## 2012-06-18 VITALS — BP 145/74 | HR 70 | Ht 67.25 in | Wt 238.2 lb

## 2012-06-18 DIAGNOSIS — E119 Type 2 diabetes mellitus without complications: Secondary | ICD-10-CM

## 2012-06-18 DIAGNOSIS — E669 Obesity, unspecified: Secondary | ICD-10-CM

## 2012-06-18 MED ORDER — LIRAGLUTIDE 18 MG/3ML ~~LOC~~ SOLN: 1.8000 mg | Freq: Every day | SUBCUTANEOUS | Status: DC

## 2012-06-18 NOTE — Assessment & Plan Note (Signed)
History of diabetes currently under excellent control of blood glucose based on  . Lab Results  Component Value Date   HGBA1C 6.5 05/12/2012  Patient's home fasting CBG readings are 100-200 mg/dl. No CBGs less than 80 or greater than 200.  Adequate glycemic control due to medication therapy with Lantus and Victoza and slight weight loss (2 lbs since last visit). Denies hypoglycemic events.  Able to verbalize appropriate hypoglycemia management plan. Decreased dose of basal insulin Lantus (insulin glargine) from 50 to 40 units daily.  Increased dose of Victoza (liraglutide) from 1.2 to 1.8 mg daily.  Encouraged patient to continue exercising at made a weight loss goal to be 235 lbs when she comes back in November.  Written patient instructions provided.  Follow up in  Pharmacist Clinic Visit in 4 weeks.   Total time in face to face counseling 20 minutes.  Patient seen with Lillia Pauls, PharmD, Pharmacy Resident.

## 2012-06-18 NOTE — Progress Notes (Signed)
  Subjective:    Patient ID: Kaitlyn Ferguson, female    DOB: August 09, 1946, 66 y.o.   MRN: [id number]  HPI Patient arrives today in good spirits.  She forgot to bring her log book of her CBGs, however she states that readings range from 100-200 mg/dl.  No values were <80 or >200.  She states she has been working out at [employer] around 4 times a week.  No obvious signs of stress or infection identified.   Review of Systems     Objective:   Physical Exam Weight: 238.2 lb       Assessment & Plan:   History of diabetes currently under excellent control of blood glucose based on  . Lab Results  Component Value Date   HGBA1C 6.5 05/12/2012  Patient's home fasting CBG readings are 100-200 mg/dl. No CBGs less than 80 or greater than 200.  Adequate glycemic control due to medication therapy with Lantus and Victoza and slight weight loss (2 lbs since last visit). Denies hypoglycemic events.  Able to verbalize appropriate hypoglycemia management plan. Decreased dose of basal insulin Lantus (insulin glargine) from 50 to 40 units daily.  Increased dose of Victoza (liraglutide) from 1.2 to 1.8 mg daily.  Encouraged patient to continue exercising at made a weight loss goal to be 235 lbs when she comes back in November.  Written patient instructions provided.  Follow up in  Pharmacist Clinic Visit in 4 weeks.   Total time in face to face counseling 20 minutes.  Patient seen with Lillia Pauls, PharmD, Pharmacy Resident.

## 2012-06-18 NOTE — Patient Instructions (Addendum)
Thank you for coming in today for your diabetes visit.  You should decrease your Lantus dose to 40 units daily.  Increase your Victoza dose to 1.8 mg daily.    Continue to exercise to reach your goal of a weight of 235 pounds by your next visit.   Follow up with me in 4 weeks.

## 2012-06-18 NOTE — Assessment & Plan Note (Signed)
History of diabetes currently under excellent control of blood glucose based on  . Lab Results  Component Value Date   HGBA1C 6.5 05/12/2012  Patient's home fasting CBG readings are 100-200 mg/dl. No CBGs less than 80 or greater than 200.  Adequate glycemic control due to medication therapy with Lantus and Victoza and slight weight loss (2 lbs since last visit). Denies hypoglycemic events.  Able to verbalize appropriate hypoglycemia management plan. Decreased dose of basal insulin Lantus (insulin glargine) from 50 to 40 units daily.  Increased dose of Victoza (liraglutide) from 1.2 to 1.8 mg daily.  Encouraged patient to continue exercising at made a weight loss goal to be 235 lbs when she comes back in November.  Written patient instructions provided.  Follow up in  Pharmacist Clinic Visit in 4 weeks.   Total time in face to face counseling 20 minutes.  Patient seen with Stephanie Bennett, PharmD, Pharmacy Resident. 

## 2012-06-19 NOTE — Progress Notes (Signed)
Patient ID: Kaitlyn Ferguson, female   DOB: 08/12/1946, 66 y.o.   MRN: [id number] Reviewed and agree with Dr. Koval's documentation and management. 

## 2012-07-16 ENCOUNTER — Ambulatory Visit (INDEPENDENT_AMBULATORY_CARE_PROVIDER_SITE_OTHER): Payer: Medicare Other | Admitting: Pharmacist

## 2012-07-16 ENCOUNTER — Encounter: Payer: Self-pay | Admitting: Pharmacist

## 2012-07-16 VITALS — BP 169/71 | Ht 67.0 in | Wt 240.6 lb

## 2012-07-16 DIAGNOSIS — E119 Type 2 diabetes mellitus without complications: Secondary | ICD-10-CM

## 2012-07-16 NOTE — Patient Instructions (Addendum)
Keep up the great work you are doing with your diet and exercise.   Decrease you Lantus to 35 units once daily.  Continue same dose of Victoza.   Next appointment with Rx clinic Pharmacist - Dr. Raymondo Band (Schedule after visit with Dr. Tye Savoy)

## 2012-07-16 NOTE — Progress Notes (Signed)
  Subjective:    Patient ID: Kaitlyn Ferguson, female    DOB: 1946/01/31, 66 y.o.   MRN: [id number]  HPI Patient arrives in usual good spirits.  She is unhappy that she did NOT meet her weight loss goal for this past month however, she notes that she has already eaten 2 [place] Dinner meals (prior to T--day)  Her blood glucose log reveals readings of 100-180 with majority of readings 120-145.   This is after a 10 unit reduction in lantus last month.  She would like to continue working on reducing insulin AND weight loss.   Her short-term (over the holidays) weight goal is to maintain her current 240 lbs.     Review of Systems     Objective:   Physical Exam        Assessment & Plan:   Diabetes currently under excellent control of blood glucose based on   Lab Results  Component Value Date   HGBA1C 6.5 05/12/2012    And ,home CBG readings of 120-145. Control is suboptimal due to dietary indiscretion but she has NO readings > 200. Denies hypoglycemic events.  Able to verbalize appropriate hypoglycemia management plan. Decreased dose of basal insulin Lantus (insulin glargine)from 40 units once daily to 35 unitls.  Written patient instructions provided.  Will follow up with Dr. Tye Savoy in December and Follow up in  Pharmacist Clinic Visit in early January.   Total time in face to face counseling 25 minutes.

## 2012-07-16 NOTE — Progress Notes (Signed)
Patient ID: Kaitlyn Ferguson, female   DOB: [birth year], 66 y.o.   MRN: [id number] Reviewed and agree with Dr. Macky Lower documentation and management.

## 2012-07-16 NOTE — Assessment & Plan Note (Signed)
Diabetes currently under excellent control of blood glucose based on   Lab Results  Component Value Date   HGBA1C 6.5 05/12/2012    And ,home CBG readings of 120-145. Control is suboptimal due to dietary indiscretion but she has NO readings > 200. Denies hypoglycemic events.  Able to verbalize appropriate hypoglycemia management plan. Decreased dose of basal insulin Lantus (insulin glargine)from 40 units once daily to 35 unitls.  Written patient instructions provided.  Follow up in  Pharmacist Clinic Visit in early January.   Total time in face to face counseling 25 minutes.

## 2012-08-05 ENCOUNTER — Ambulatory Visit: Payer: Medicare Other | Admitting: Family Medicine

## 2012-08-06 ENCOUNTER — Encounter: Payer: Self-pay | Admitting: Family Medicine

## 2012-08-06 ENCOUNTER — Ambulatory Visit (INDEPENDENT_AMBULATORY_CARE_PROVIDER_SITE_OTHER): Payer: Medicare Other | Admitting: Family Medicine

## 2012-08-06 ENCOUNTER — Telehealth: Payer: Self-pay | Admitting: *Deleted

## 2012-08-06 VITALS — BP 129/69 | HR 64 | Temp 99.5°F | Wt 239.0 lb

## 2012-08-06 DIAGNOSIS — E119 Type 2 diabetes mellitus without complications: Secondary | ICD-10-CM

## 2012-08-06 DIAGNOSIS — M545 Low back pain, unspecified: Secondary | ICD-10-CM

## 2012-08-06 LAB — LIPID PANEL
Cholesterol: 105 mg/dL (ref 0–200)
HDL: 36 mg/dL — ABNORMAL LOW (ref >39)
LDL Cholesterol: 36 mg/dL (ref 0–99)
Total CHOL/HDL Ratio: 2.9 Ratio
Triglycerides: 167 mg/dL — ABNORMAL HIGH (ref <150)
VLDL: 33 mg/dL (ref 0–40)

## 2012-08-06 LAB — POCT GLYCOSYLATED HEMOGLOBIN (HGB A1C): Hemoglobin A1C: 6.8

## 2012-08-06 MED ORDER — DICLOFENAC SODIUM 1 % TD GEL: 2.0000 g | Freq: Four times a day (QID) | TRANSDERMAL | Status: DC

## 2012-08-06 NOTE — Telephone Encounter (Signed)
PA required for Voltaren gel . Form placed on  Dr. Sherron Flemings Cruz's desk.

## 2012-08-06 NOTE — Assessment & Plan Note (Signed)
Hemoglobin A1c at goal today: 6.8.  Continue Lantus 35 units daily and Victoza 1.8 daily.  Encouraged patient to continue to work out and to [facility] to avoid desserts over the holidays.  Discussed portion control and eating in moderation.  Patient to follow up with Dr. Raymondo Band in January.  Follow up with me in 3 months or sooner as needed.

## 2012-08-06 NOTE — Patient Instructions (Addendum)
It was great to see you today, Kaitlyn Ferguson. Your Hemoglobin A1c today is 6.8.   Good work! Return to clinic in 3 months for diabetes follow up.  For back pain, try rubbing Voltaren gel to painful area 4 times per day. If pain is really bad, take one baclofen only!  Have a happy holiday season!  Diet Recommendations for Diabetes   Starchy (carb) foods include: Bread, rice, pasta, potatoes, corn, crackers, bagels, muffins, all baked goods.   Protein foods include: Meat, fish, poultry, eggs, dairy foods, and beans such as pinto and kidney beans (beans also provide carbohydrate).   1. Eat at least 3 meals and 1-2 snacks per day. Never go more than 4-5 hours while awake without eating.  2. Limit starchy foods to TWO per meal and ONE per snack. ONE portion of a starchy  food is equal to the following:   - ONE slice of bread (or its equivalent, such as half of a hamburger bun).   - 1/2 cup of a "scoopable" starchy food such as potatoes or rice.   - 1 OUNCE (28 grams) of starchy snack foods such as crackers or pretzels (look on label).   - 15 grams of carbohydrate as shown on food label.  3. Both lunch and dinner should include a protein food, a carb food, and vegetables.   - Obtain twice as many veg's as protein or carbohydrate foods for both lunch and dinner.   - Try to keep frozen veg's on hand for a quick vegetable serving.     - Fresh or frozen veg's are best.  4. Breakfast should always include protein.

## 2012-08-06 NOTE — Assessment & Plan Note (Signed)
No relief with Aleve or Tramadol.  Patient stopped taking Baclofen for muscle spasms since she was hospitalized for altered mental status likely secondary oversedation.  Rx for Voltaren gel QID.  Also advised to take Baclofen for spasms, but only once a day to prevent oversedation.  She understands.  Will follow up as needed.  Consider PT referral if pain persists.

## 2012-08-06 NOTE — Progress Notes (Signed)
  Subjective:     Kaitlyn Ferguson is a 66 y.o. female who presents for follow up of diabetes.  Current symptoms include: hyperglycemia. Patient denies foot ulcerations, hypoglycemia , paresthesia of the feet, polydipsia, polyuria and visual disturbances. Evaluation to date has been: hemoglobin A1C. Home sugars: BGs consistently in an acceptable range low 120 - 180.  Admitted to eating [employer] cinnamon rolls and cherry cream pies over the Thanksgiving holiday.  Current treatments: Lantus 35 units daily and Victoza 1.8 daily. Last dilated eye exam 2013.  Back pain: symptoms started for several years ago.  Has had back surgery in 1984.  Since then she had intermittent back pain, worsening in the last 10 years.  Worse thanksgiving day and took OTC Aleve and did not relieve pain.  Pain scale 8/10.  Located R side lumbar spine.  Described as muscle spasms.  Denies any GU symptoms or dysuria.  Denies hx kidney stones.  No loss of bladder or bowel control.  Review of Systems Pertinent items are noted in HPI.    Objective:    BP 129/69  Pulse 64  Temp 99.5 F (37.5 C) (Oral)  Wt 239 lb (108.41 kg) General appearance: alert, cooperative and no distress Lungs: clear to auscultation bilaterally Heart: regular rate and rhythm, S1, S2 normal, no murmur, click, rub or gallop Abdomen: soft, non-tender; bowel sounds normal; no masses,  no organomegaly MSK: low back - no bony tenderness; pain worse with both flexion and extension; no skin or tissue texture changes Diabetic foot exam: normal sensation, no open wounds or ulcers  Laboratory: No components found with this basename: A1C      Assessment:    1. Diabetes Type 2  2. Low back pain   Plan:   See Problem List

## 2012-08-07 ENCOUNTER — Encounter: Payer: Self-pay | Admitting: Family Medicine

## 2012-08-07 NOTE — Telephone Encounter (Signed)
PA form faxed to insurance company.

## 2012-08-07 NOTE — Telephone Encounter (Signed)
Ins. Co. Called back to say that it has been approved as of today for indefinite.

## 2012-08-07 NOTE — Telephone Encounter (Signed)
Pharmacy notified.

## 2012-08-14 ENCOUNTER — Other Ambulatory Visit: Payer: Self-pay | Admitting: *Deleted

## 2012-08-14 DIAGNOSIS — E119 Type 2 diabetes mellitus without complications: Secondary | ICD-10-CM

## 2012-08-16 MED ORDER — BD LANCET ULTRAFINE 33G MISC: Status: DC

## 2012-08-17 ENCOUNTER — Other Ambulatory Visit: Payer: Self-pay | Admitting: Family Medicine

## 2012-08-20 ENCOUNTER — Other Ambulatory Visit: Payer: Self-pay | Admitting: *Deleted

## 2012-08-20 DIAGNOSIS — E119 Type 2 diabetes mellitus without complications: Secondary | ICD-10-CM

## 2012-08-20 MED ORDER — "INSULIN SYRINGE-NEEDLE U-100 31G X 5/16"" 1 ML MISC": Status: DC

## 2012-08-24 ENCOUNTER — Telehealth: Payer: Self-pay | Admitting: Family Medicine

## 2012-08-24 ENCOUNTER — Emergency Department (HOSPITAL_COMMUNITY)
Admission: EM | Admit: 2012-08-24 | Discharge: 2012-08-24 | Disposition: A | Discharge status: Home / Self Care | Payer: Medicare Other | Attending: Emergency Medicine | Admitting: Emergency Medicine

## 2012-08-24 ENCOUNTER — Encounter (HOSPITAL_COMMUNITY): Payer: Self-pay | Admitting: *Deleted

## 2012-08-24 ENCOUNTER — Emergency Department (HOSPITAL_COMMUNITY): Payer: Medicare Other

## 2012-08-24 DIAGNOSIS — I10 Essential (primary) hypertension: Secondary | ICD-10-CM | POA: Insufficient documentation

## 2012-08-24 DIAGNOSIS — Z8669 Personal history of other diseases of the nervous system and sense organs: Secondary | ICD-10-CM | POA: Insufficient documentation

## 2012-08-24 DIAGNOSIS — R61 Generalized hyperhidrosis: Secondary | ICD-10-CM | POA: Insufficient documentation

## 2012-08-24 DIAGNOSIS — N390 Urinary tract infection, site not specified: Secondary | ICD-10-CM | POA: Insufficient documentation

## 2012-08-24 DIAGNOSIS — E785 Hyperlipidemia, unspecified: Secondary | ICD-10-CM | POA: Insufficient documentation

## 2012-08-24 DIAGNOSIS — E669 Obesity, unspecified: Secondary | ICD-10-CM | POA: Insufficient documentation

## 2012-08-24 DIAGNOSIS — Z794 Long term (current) use of insulin: Secondary | ICD-10-CM | POA: Insufficient documentation

## 2012-08-24 DIAGNOSIS — E860 Dehydration: Secondary | ICD-10-CM

## 2012-08-24 DIAGNOSIS — R42 Dizziness and giddiness: Secondary | ICD-10-CM | POA: Insufficient documentation

## 2012-08-24 DIAGNOSIS — E039 Hypothyroidism, unspecified: Secondary | ICD-10-CM | POA: Insufficient documentation

## 2012-08-24 DIAGNOSIS — R55 Syncope and collapse: Secondary | ICD-10-CM

## 2012-08-24 DIAGNOSIS — Z8739 Personal history of other diseases of the musculoskeletal system and connective tissue: Secondary | ICD-10-CM | POA: Insufficient documentation

## 2012-08-24 DIAGNOSIS — Z7982 Long term (current) use of aspirin: Secondary | ICD-10-CM | POA: Insufficient documentation

## 2012-08-24 DIAGNOSIS — R6883 Chills (without fever): Secondary | ICD-10-CM | POA: Insufficient documentation

## 2012-08-24 DIAGNOSIS — E119 Type 2 diabetes mellitus without complications: Secondary | ICD-10-CM | POA: Insufficient documentation

## 2012-08-24 DIAGNOSIS — M129 Arthropathy, unspecified: Secondary | ICD-10-CM | POA: Insufficient documentation

## 2012-08-24 DIAGNOSIS — Z79899 Other long term (current) drug therapy: Secondary | ICD-10-CM | POA: Insufficient documentation

## 2012-08-24 LAB — CBC WITH DIFFERENTIAL/PLATELET
Basophils Absolute: 0 10*3/uL (ref 0.0–0.1)
Basophils Relative: 0 % (ref 0–1)
Eosinophils Absolute: 0.1 10*3/uL (ref 0.0–0.7)
Eosinophils Relative: 1 % (ref 0–5)
HCT: 41.1 % (ref 36.0–46.0)
Hemoglobin: 13.5 g/dL (ref 12.0–15.0)
Lymphocytes Relative: 10 % — ABNORMAL LOW (ref 12–46)
Lymphs Abs: 1.5 10*3/uL (ref 0.7–4.0)
MCH: 26 pg (ref 26.0–34.0)
MCHC: 32.8 g/dL (ref 30.0–36.0)
MCV: 79.2 fL (ref 78.0–100.0)
Monocytes Absolute: 1.3 10*3/uL — ABNORMAL HIGH (ref 0.1–1.0)
Monocytes Relative: 9 % (ref 3–12)
Neutro Abs: 12 10*3/uL — ABNORMAL HIGH (ref 1.7–7.7)
Neutrophils Relative %: 80 % — ABNORMAL HIGH (ref 43–77)
Platelets: 240 10*3/uL (ref 150–400)
RBC: 5.19 MIL/uL — ABNORMAL HIGH (ref 3.87–5.11)
RDW: 14.3 % (ref 11.5–15.5)
WBC: 15 10*3/uL — ABNORMAL HIGH (ref 4.0–10.5)

## 2012-08-24 LAB — URINE MICROSCOPIC-ADD ON

## 2012-08-24 LAB — URINALYSIS, ROUTINE W REFLEX MICROSCOPIC
Bilirubin Urine: NEGATIVE
Bilirubin Urine: NEGATIVE
Glucose, UA: NEGATIVE mg/dL
Glucose, UA: NEGATIVE mg/dL
Hgb urine dipstick: NEGATIVE
Hgb urine dipstick: NEGATIVE
Ketones, ur: NEGATIVE mg/dL
Ketones, ur: NEGATIVE mg/dL
Nitrite: NEGATIVE
Nitrite: NEGATIVE
Protein, ur: NEGATIVE mg/dL
Protein, ur: NEGATIVE mg/dL
Specific Gravity, Urine: 1.022 (ref 1.005–1.030)
Specific Gravity, Urine: 1.02 (ref 1.005–1.030)
Urobilinogen, UA: 0.2 mg/dL (ref 0.0–1.0)
Urobilinogen, UA: 0.2 mg/dL (ref 0.0–1.0)
pH: 5 (ref 5.0–8.0)
pH: 5 (ref 5.0–8.0)

## 2012-08-24 LAB — COMPREHENSIVE METABOLIC PANEL
ALT: 13 U/L (ref 0–35)
AST: 16 U/L (ref 0–37)
Albumin: 3.9 g/dL (ref 3.5–5.2)
Alkaline Phosphatase: 90 U/L (ref 39–117)
BUN: 32 mg/dL — ABNORMAL HIGH (ref 6–23)
CO2: 28 mEq/L (ref 19–32)
Calcium: 10.1 mg/dL (ref 8.4–10.5)
Chloride: 97 mEq/L (ref 96–112)
Creatinine, Ser: 1.03 mg/dL (ref 0.50–1.10)
GFR calc Af Amer: 64 mL/min — ABNORMAL LOW (ref >90)
GFR calc non Af Amer: 55 mL/min — ABNORMAL LOW (ref >90)
Glucose, Bld: 137 mg/dL — ABNORMAL HIGH (ref 70–99)
Potassium: 3.7 mEq/L (ref 3.5–5.1)
Sodium: 138 mEq/L (ref 135–145)
Total Bilirubin: 0.4 mg/dL (ref 0.3–1.2)
Total Protein: 7 g/dL (ref 6.0–8.3)

## 2012-08-24 LAB — TROPONIN I
Troponin I: <0.3 ng/mL (ref <0.30)
Troponin I: <0.3 ng/mL (ref <0.30)

## 2012-08-24 LAB — LIPASE, BLOOD: Lipase: 68 U/L — ABNORMAL HIGH (ref 11–59)

## 2012-08-24 LAB — GLUCOSE, CAPILLARY: Glucose-Capillary: 149 mg/dL — ABNORMAL HIGH (ref 70–99)

## 2012-08-24 MED ORDER — MORPHINE SULFATE 4 MG/ML IJ SOLN
4.0000 mg | Freq: Once | INTRAMUSCULAR | Status: AC
  Administered 2012-08-24: 4 mg via INTRAVENOUS
  Filled 2012-08-24: qty 1

## 2012-08-24 MED ORDER — CEPHALEXIN 500 MG PO CAPS: 500.0000 mg | ORAL_CAPSULE | Freq: Two times a day (BID) | ORAL | Status: DC

## 2012-08-24 MED ORDER — ONDANSETRON HCL 4 MG PO TABS: 4.0000 mg | ORAL_TABLET | Freq: Three times a day (TID) | ORAL | Status: DC | PRN

## 2012-08-24 MED ORDER — ONDANSETRON HCL 4 MG/2ML IJ SOLN
4.0000 mg | Freq: Once | INTRAMUSCULAR | Status: DC
  Filled 2012-08-24: qty 2

## 2012-08-24 MED ORDER — DEXTROSE 5 % IV SOLN
1.0000 g | Freq: Once | INTRAVENOUS | Status: AC
  Administered 2012-08-24: 1 g via INTRAVENOUS
  Filled 2012-08-24: qty 10

## 2012-08-24 MED ORDER — SODIUM CHLORIDE 0.9 % IV BOLUS (SEPSIS)
1000.0000 mL | Freq: Once | INTRAVENOUS | Status: AC
  Administered 2012-08-24: 1000 mL via INTRAVENOUS

## 2012-08-24 NOTE — ED Notes (Signed)
Pt ambulated to the bathroom without difficulty.  Patient stated she felt better than on arrival.

## 2012-08-24 NOTE — ED Provider Notes (Signed)
I have placed disposition to discharge. [facility] has seen and have follow-up arranged. I have not evaluated the patient.   Renne Crigler, [place] 08/24/12 2100

## 2012-08-24 NOTE — ED Notes (Signed)
Pt has a specimen cup at the bedside; instructed of need for sample. Pt sts she is unable to go at this time.

## 2012-08-24 NOTE — ED Notes (Signed)
Held Zofran d/t pt having medication enroute to hospital and pt stated that she was not nauseated at this time.

## 2012-08-24 NOTE — Consult Note (Signed)
FMTS Attending Consult Note Patient seen and examined by me in the ED, discussed extensively with resident team and I agree with their plan for management. Briefly, patient is a 66yoF who was brought to ED by EMS today for episode of syncope while in [employer] program; no premonition or palpitations, no chest pain, no observed seizure activity. This is the first such episode for this patient.  She reports having nausea prior to the syncopal event, and emesis after awakening. At time of my interview around 4pm the patient reports feeling in her usual good state of health, without any complaints.   Patient had eaten her usual breakfast of fried egg, two pieces of toast and cup and a half of coffee prior to the class. The class had been ongoing for 45 min at the time of syncopal episode.   PMHx significant for HTN and diabetes; no known structural or atherosclerotic heart disease.   Exam Well appearing, no apparent distress. Neck supple. No carotid bruits. COR regular S1S2, perhaps trace to 1/6 systolic M at base of heart.  PULM Clear bilat. ABD soft, nontender.  No suprapubic tenderness. No CVA tenderness. Normal audible bowel sounds.   A/P: Single syncopal episode in patient during mild exertion; had been feeling ill (R flank pain and had taken 2 tabs OTC Aleve before class). Orthostatic BP measurements in ED are unremarkable.  I agree with plan for ECHO and close outpatient follow-up; she is instructed clearly not to engage in exertional activity before getting further instructions to do so (upon reading of ECHO).   Will resend clean-catch UA, given large quantities of squams in first specimen.  Culture is pending from first specimen.  Received initial dose Ceftriaxone in ED.  Paula Compton, MD

## 2012-08-24 NOTE — ED Notes (Addendum)
Per EMS- pt began having nausea on Thursday. Pt began feeling dizzy and became diaphoretic while at the gym today. Pt also vomitted today. CBG 155. BP 93/65. Pt received 4mg  of zofran en route and approx 150mg  of NS.

## 2012-08-24 NOTE — Consult Note (Signed)
Family Medicine Teaching [facility] Admission History and Physical Service Pager: [phone]  Patient name: Kaitlyn Ferguson Medical record number: [id number] Date of birth: 1945/09/01 Age: 66 y.o. Gender: female  Primary Care Provider: DE LA CRUZ,IVY, DO  Chief Complaint: Syncope, weakness, nausea, vomiting  History of Present Illness: Kaitlyn Ferguson is a 66 y.o. year old female presenting after a syncopal episode while she was working out today. States that she was dizzy and sweaty during an aerobics class and then went and got on a bike when she passed out. She states she got on the bike pedaled once and then woke up to find her friend at her side who said she was out for a few seconds. She fell to the ground without hitting her head.  She has been having nausea for the last year but more prominently over the 5 days. She states that for the last 5 days she hasnt felt like doing anything and has been mostly resting in bed through the weekend. She also had one epsiode of emesis this morning which she describes as water like. She was not doing more work than usual but it had been 3 days since she worked out last. She ate this morning before working out just like usual.  Patient also endorses low back pain, which is a chronic issue.  She denies dysuria, flank pain, and foul odor to her urine, but does have some polyuria. She also has been having some low back pain which is chronic and generally attributed to muscle spasms. She can stay with her friend for the night so that she will not be alone.   She denies chest pain, shortness of breath, and palpitations.  She denies any history of previous MI or cardiac arrhythmias.  She has been tolerating PO fluids and food now in the ED.  She was given 1 dose of IV Rocephin for possible UTI.  Patient Active Problem List  Diagnosis  . HYPOTHYROIDISM  . DIABETES MELLITUS, TYPE II, CONTROLLED  . HYPERLIPIDEMIA  . OBESITY, NOS  . HYPERTENSION, BENIGN  SYSTEMIC  . CHOLELITHIASIS  . ARTHRITIS, DEGENERATIVE  . Cervical radiculopathy  . Murmur  . Viral illness  . GERD (gastroesophageal reflux disease)  . Fatigue  . Sciatica of left side  . Altered mental status  . Hypokalemia   Past Medical History: Past Medical History  Diagnosis Date  . Arthritis   . Diabetes mellitus   . Cholelithiasis   . Hyperlipidemia   . Hypothyroidism   . Obesity   . DJD (degenerative joint disease) 10/07/2008  . Osteomyelitis of ankle     Complications of Right ankle injury  . DJD (degenerative joint disease)   . Hypertension   . Neuropathy    Past Surgical History: Past Surgical History  Procedure Date  . Abdominal hysterectomy   . Spine surgery   . Tonsillectomy     Childhood  . Neck mass excision 12/29/2001  . Ankle fusion 02/1996    Right ankle tib/talar  . Carpal tunnel release 1993    Right arm   Social History: History  Substance Use Topics  . Smoking status: Never Smoker   . Smokeless tobacco: Never Used     Comment: No tobacco currently  . Alcohol Use: No   For any additional social history documentation, please refer to relevant sections of EMR.  Family History: Family History  Problem Relation Age of Onset  . Diabetes Brother   . Diabetes Sister  DM2-lost weight-now okay  . Heart attack Father     First MI- early 47's  . Stroke Mother   . Heart failure Mother   . Multiple myeloma Mother     Deceased at 85 due to same   Allergies: Allergies  Allergen Reactions  . Vancomycin Itching    Pre-medicated with benadryl and tylenol 1 hr before administration of vanc   No current facility-administered medications on file prior to encounter.   Current Outpatient Prescriptions on File Prior to Encounter  Medication Sig Dispense Refill  . aspirin 81 MG tablet Take 81 mg by mouth at bedtime.       . gabapentin (NEURONTIN) 800 MG tablet Take 800 mg by mouth 2 (two) times daily.       Marland Kitchen levothyroxine (SYNTHROID,  LEVOTHROID) 112 MCG tablet Take 1 tablet (112 mcg total) by mouth daily.  90 tablet  4  . Liraglutide (VICTOZA) 18 MG/3ML SOLN Inject 0.3 mLs (1.8 mg total) into the skin daily.  9 mL  11  . lisinopril-hydrochlorothiazide (PRINZIDE,ZESTORETIC) 20-25 MG per tablet take 1 tablet by mouth once daily  90 tablet  3  . omeprazole (PRILOSEC) 40 MG capsule Take 40 mg by mouth daily.      . rosuvastatin (CRESTOR) 20 MG tablet Take 20 mg by mouth daily.      . B-D ULTRA-FINE 33 LANCETS MISC Use as directed  100 each  11  . Insulin Syringe-Needle U-100 31G X 5/16" 1 ML MISC Use with insulin as directed.  100 each  11   Review Of Systems: Per HPI, otherwise 12 point review of systems was performed and was unremarkable.  Physical Exam: BP 105/61  Pulse 80  Temp 97.8 F (36.6 C) (Oral)  Resp 16  SpO2 96% Exam:  Gen: NAD, alert, cooperative with exam HEENT: NCAT, EOMI, sticky mucous membranes CV: RRR, good S1/S2, 1/6 SEM Resp: CTABL, no wheezes, non-labored Abd: SNTND, BS present, no guarding or organomegaly Ext: No edema, warm, 2+ DP pulses.  Neuro: Alert and oriented,  5/5 strength on BL upper extremities, 5/5 strength on L LE and 4/5 strength on R LE. No Romberg sign; able to stand and ambulate around room without difficulty  Labs and Imaging: CBC BMET   Lab 08/24/12 1048  WBC 15.0*  HGB 13.5  HCT 41.1  PLT 240    Lab 08/24/12 1048  NA 138  K 3.7  CL 97  CO2 28  BUN 32*  CREATININE 1.03  GLUCOSE 137*  CALCIUM 10.1    CXR 08/24/2012 IMPRESSION:  No acute cardiopulmonary process.  Troponin I <0.30 X2 UA: SG 1.022, Large LE, Negative Nitrites, many squamous cells, many epithelials, hyaline casts  Assessment and Plan: Kaitlyn Ferguson is a 66 y.o. year old female with DM2 presenting with a syncopal episode today that occurred while she was exercising.   # Exertional Syncope The most likely etiology of her syncope is dehydration from a possible recent viral gastritis vs.  Possible UTI. We considered structural heart disease or arryhthmia but without chest pain, dyspnea, heart palpitations or other prodromal symptoms, this seems less likely. We also considered orthostatic hypotension, but orthostatic vitals were normal. We also checked two troponins and obtained an EKG to rule out a myocardial infarction as a possible etiology.  Troponin negative x 2.  EKG NSR, unchanged from previous.  Her nausea has improved with fluids and antibiotics, addressed below, and she is able to stand and walk easily.  We  will order 2D Echo w/ contrast today and follow up results outpatient. Considering her improvement we will discharge her home with close follow up at her PCP's office who will follow up on the [facility] results.    # UTI In the ED a UA was checked, as she was having nausea and back pain, which showed large LE and many squamous cells and bacteria. She was treated with 1 gram of rocephin IV for that. We repeated the UA which is still pending and will plan to send her a prescription for keflex if it's consistent with infection.   # DM2 Her diabetes is under good control with her last A1C being 6.8 on 08/06/12. Additionally she confirms that her CBG was in the 130's this morning and it was 149 on presentation to the ED. We will not plan any medication adjustments but will recognize the risk of a more serious infection with her current UTI.  In the case of a more serious infection we feel that the 1 gram of IV rocephin plus seven days of PO keflex should cover her well.   Dispo: Home with physical activity restrictions until seen by PCP.  Follow up appointment Friday 08/28/12 @ 2:30 PM.  Kevin Fenton, MD 08/24/2012, 2:42 PM  PGY-3 ADDENDUM Patient seen, examined independently.  Available data reviewed.  Agree with findings, assessment, and plan as outlined by Dr. Ermalinda Memos.  My additional findings are documented and highlighted above in blue.  [employer],  DO 08/24/2012 5:20 PM

## 2012-08-24 NOTE — ED Notes (Signed)
Pt tolerating Sprite Zero and sandwich well.  No complaints of nausea

## 2012-08-24 NOTE — ED Provider Notes (Signed)
Medical screening examination/treatment/procedure(s) were conducted as a shared visit with non-physician practitioner(s) and myself.  I personally evaluated the patient during the encounter  Episode of nausea, dizziness, diaphoresis, weakness after exercising.  No chest pain or SOB.  LOC per trainer at gym.  Now back to baseline. No focal deficits.  Orthostatics negative, sugar normal. EKG nonischemic, no brugada or prolonged QT. Syncope likely vasovagal from dehydration and possible UTI.  Structural heart disease considered but less likely without SOB, CP and normal EKGs and serial troponins.  Tolerating PO and ambulator in ED.  D.w [facility] who have seen patietn and weill arrange for echo.  Glynn Octave, MD 08/24/12 [phone]

## 2012-08-24 NOTE — ED Provider Notes (Signed)
History     CSN: [id number]  Arrival date & time 08/24/12  4098   First MD Initiated Contact with Patient 08/24/12 1001      Chief Complaint  Patient presents with  . Emesis    (Consider location/radiation/quality/duration/timing/severity/associated sxs/prior treatment) HPI  66 year old female with history of diabetes, HTN, and history of hyperlipidemia presents complaining of nausea and vomiting. Patient reports she has been having intermittent sensation of nausea for the past 4 or 5 days. This morning patient went to the gym to work out, after she finished her exercise about to start her elliptical exercise she felt increased nausea, was diaphoretic, with dizziness, and feeling like she has no energy. Symptom lasting 20-30 minutes. Her friends noticed that she was clammy and disoriented and called EMS to bring her to ER for further evaluation. Pt reports she felt as if she did not remembered what happened for a few minutes.  She denies falling or hitting her head.  Patient states she felt better after receiving antinausea medication via EMS. She reports she breakfast as usual, and a blood sugar was 155 per EMS.  The onset is gradual, intermittent, moderate in severity, improved.  She denies fever, chills, chest pain, shortness of breath, sneezing, running nose, cough and, abdominal pain, numbness or weakness, or rash. She is a nonsmoker with no prior cardiac history.   Past Medical History  Diagnosis Date  . Arthritis   . Diabetes mellitus   . Cholelithiasis   . Hyperlipidemia   . Hypothyroidism   . Obesity   . DJD (degenerative joint disease) 10/07/2008  . Osteomyelitis of ankle     Complications of Right ankle injury  . DJD (degenerative joint disease)   . Hypertension   . Neuropathy     Past Surgical History  Procedure Date  . Abdominal hysterectomy   . Spine surgery   . Tonsillectomy     Childhood  . Neck mass excision 12/29/2001  . Ankle fusion 02/1996    Right  ankle tib/talar  . Carpal tunnel release 1993    Right arm    Family History  Problem Relation Age of Onset  . Diabetes Brother   . Diabetes Sister     DM2-lost weight-now okay  . Heart attack Father     First MI- early 24's  . Stroke Mother   . Heart failure Mother   . Multiple myeloma Mother     Deceased at 4 due to same    History  Substance Use Topics  . Smoking status: Never Smoker   . Smokeless tobacco: Never Used     Comment: No tobacco currently  . Alcohol Use: No    OB History    Grav Para Term Preterm Abortions TAB SAB Ect Mult Living                  Review of Systems  Constitutional: Positive for chills. Negative for fever.  HENT: Negative for congestion.   Eyes: Negative for discharge and redness.  Respiratory: Negative for cough and shortness of breath.   Cardiovascular: Negative for chest pain.  Gastrointestinal: Positive for nausea. Negative for vomiting, abdominal pain and diarrhea.  Genitourinary: Negative for dysuria.  Musculoskeletal: Negative for back pain.  Skin: Negative for rash.  Neurological: Negative for syncope, numbness and headaches.    Allergies  Vancomycin  Home Medications   Current Outpatient Rx  Name  Route  Sig  Dispense  Refill  . ASPIRIN 81 MG PO TABS  Oral   Take 81 mg by mouth at bedtime.          . BD LANCET ULTRAFINE 33G MISC      Use as directed   100 each   11   . DICLOFENAC SODIUM 1 % TD GEL   Topical   Apply 2 g topically 4 (four) times daily.   100 g   1   . GABAPENTIN 800 MG PO TABS               . INSULIN SYRINGE-NEEDLE U-100 31G X 5/16" 1 ML MISC      Use with insulin as directed.   100 each   11   . LANTUS SOLOSTAR 100 UNIT/ML [place] SOLN      inject 72 units subcutaneously daily   22 mL   11   . LEVOTHYROXINE SODIUM 112 MCG PO TABS   Oral   Take 1 tablet (112 mcg total) by mouth daily.   90 tablet   4   . LIRAGLUTIDE 18 MG/3ML [place] SOLN   Subcutaneous   Inject 0.3 mLs (1.8  mg total) into the skin daily.   9 mL   11   . LISINOPRIL-HYDROCHLOROTHIAZIDE 20-25 MG PO TABS      take 1 tablet by mouth once daily   90 tablet   3   . OMEPRAZOLE 40 MG PO CPDR   Oral   Take 40 mg by mouth daily.         Marland Kitchen ROSUVASTATIN CALCIUM 20 MG PO TABS   Oral   Take 20 mg by mouth daily.           There were no vitals taken for this visit.  Physical Exam  Nursing note and vitals reviewed. Constitutional: She is oriented to person, place, and time. She appears well-developed and well-nourished. No distress.       Awake, alert, nontoxic appearance  HENT:  Head: Atraumatic.  Left Ear: External ear normal.  Eyes: Conjunctivae normal and EOM are normal. Pupils are equal, round, and reactive to light. Right eye exhibits no discharge. Left eye exhibits no discharge.  Neck: Neck supple.  Cardiovascular: Normal rate and regular rhythm.  Exam reveals no gallop and no friction rub.   No murmur heard. Pulmonary/Chest: Effort normal. No respiratory distress. She exhibits no tenderness.  Abdominal: Soft. There is no tenderness. There is no rebound.       No CVA tenderness  Musculoskeletal: She exhibits no tenderness.       ROM appears intact, no obvious focal weakness.  5 out of 5 strength to all 4 extremities, neurovascular intact.  Neurological: She is alert and oriented to person, place, and time.       Mental status and motor strength appears intact  Skin: No rash noted.  Psychiatric: She has a normal mood and affect.    ED Course  Procedures (including critical care time)  Labs Reviewed - No data to display No results found.   No diagnosis found.   Date: 08/24/2012  Rate: 82  Rhythm: normal sinus rhythm  QRS Axis: normal  Intervals: normal  ST/T Wave abnormalities: nonspecific ST/T changes  Conduction Disutrbances:nonspecific intraventricular conduction delay  Narrative Interpretation:   Old EKG Reviewed: unchanged.    Results for orders placed  during the hospital encounter of 08/24/12  CBC WITH DIFFERENTIAL      Component Value Range   WBC 15.0 (*) 4.0 - 10.5 K/uL   RBC 5.19 (*)  3.87 - 5.11 MIL/uL   Hemoglobin 13.5  12.0 - 15.0 g/dL   HCT 27.2  53.6 - 64.4 %   MCV 79.2  78.0 - 100.0 fL   MCH 26.0  26.0 - 34.0 pg   MCHC 32.8  30.0 - 36.0 g/dL   RDW 03.4  74.2 - 59.5 %   Platelets 240  150 - 400 K/uL   Neutrophils Relative 80 (*) 43 - 77 %   Neutro Abs 12.0 (*) 1.7 - 7.7 K/uL   Lymphocytes Relative 10 (*) 12 - 46 %   Lymphs Abs 1.5  0.7 - 4.0 K/uL   Monocytes Relative 9  3 - 12 %   Monocytes Absolute 1.3 (*) 0.1 - 1.0 K/uL   Eosinophils Relative 1  0 - 5 %   Eosinophils Absolute 0.1  0.0 - 0.7 K/uL   Basophils Relative 0  0 - 1 %   Basophils Absolute 0.0  0.0 - 0.1 K/uL  COMPREHENSIVE METABOLIC PANEL      Component Value Range   Sodium 138  135 - 145 mEq/L   Potassium 3.7  3.5 - 5.1 mEq/L   Chloride 97  96 - 112 mEq/L   CO2 28  19 - 32 mEq/L   Glucose, Bld 137 (*) 70 - 99 mg/dL   BUN 32 (*) 6 - 23 mg/dL   Creatinine, Ser 6.38  0.50 - 1.10 mg/dL   Calcium 75.6  8.4 - 43.3 mg/dL   Total Protein 7.0  6.0 - 8.3 g/dL   Albumin 3.9  3.5 - 5.2 g/dL   AST 16  0 - 37 U/L   ALT 13  0 - 35 U/L   Alkaline Phosphatase 90  39 - 117 U/L   Total Bilirubin 0.4  0.3 - 1.2 mg/dL   GFR calc non Af Amer 55 (*) >90 mL/min   GFR calc Af Amer 64 (*) >90 mL/min  LIPASE, BLOOD      Component Value Range   Lipase 68 (*) 11 - 59 U/L  URINALYSIS, ROUTINE W REFLEX MICROSCOPIC      Component Value Range   Color, Urine YELLOW  YELLOW   APPearance HAZY (*) CLEAR   Specific Gravity, Urine 1.022  1.005 - 1.030   pH 5.0  5.0 - 8.0   Glucose, UA NEGATIVE  NEGATIVE mg/dL   Hgb urine dipstick NEGATIVE  NEGATIVE   Bilirubin Urine NEGATIVE  NEGATIVE   Ketones, ur NEGATIVE  NEGATIVE mg/dL   Protein, ur NEGATIVE  NEGATIVE mg/dL   Urobilinogen, UA 0.2  0.0 - 1.0 mg/dL   Nitrite NEGATIVE  NEGATIVE   Leukocytes, UA LARGE (*) NEGATIVE  TROPONIN I        Component Value Range   Troponin I <0.30  <0.30 ng/mL  GLUCOSE, CAPILLARY      Component Value Range   Glucose-Capillary 149 (*) 70 - 99 mg/dL   Comment 1 Documented in Chart     Comment 2 Notify RN    URINE MICROSCOPIC-ADD ON      Component Value Range   Squamous Epithelial / LPF MANY (*) RARE   WBC, UA 11-20  <3 WBC/hpf   RBC / HPF 0-2  <3 RBC/hpf   Bacteria, UA MANY (*) RARE   Casts HYALINE CASTS (*) NEGATIVE   Dg Chest 2 View  08/24/2012  *RADIOLOGY REPORT*  Clinical Data: Syncope, weakness  CHEST - 2 VIEW  Comparison: Non chest radiograph 11/18/2011  Findings: Stable cardiac silhouette  with ectatic aorta.  There is chronic bronchitic markings centrally.  Rib deformities on the right are chronic.  No effusion, infiltrate, or pneumothorax.  IMPRESSION: No acute cardiopulmonary process.   Original Report Authenticated By: Genevive Bi, M.D.      1. Exercise induce syncope 2. UTI 3. dehydration    MDM  Pt presents with nausea, dizziness, diaphoretic.  Her sxs has improved.  She has risk factor for CAD.  Work up initiated.  Care discussed with my attending.    12:34 PM Pt sts she feels much better after receiving antiemetic and pain medication.  Has appetite, but has been belching.  She has normal orthostatic VS, unremarkable ECG, No clinical finding to suggest CHF, no SOB, normal H&H, and no family of sudden cardiac death.    Labs with elevated WBC of 15, and some evidence of dehydration considering BUN 32, Cr 1.3, which suggest prerenal azotemia.  Will continue IVF.  Will have pt eat and ambulate and will reassess.     2:03 PM UA shows evidence suggestive of UTI.  Urine culture sent, rocephin via IV started.  Pt did endorse back pain with no significant CVA tenderness, however may suggest pyelonephritis.  She is afebrile.    Since pt did have exercise induce near syncope (questionable syncope) and never had echo before, i will consult with [facility] for further management.   Pt is near baseline.    2:31 PM I have consulted with [facility] Dr. Ermalinda Memos, who agrees to see pt in ER and will determine dispo.  Pt may benefit outpt echo if deem appropriate.    4:08 PM [facility] will continue current management and will discharge as appropriate.   BP 115/51  Pulse 68  Temp 97.8 F (36.6 C) (Oral)  Resp 18  SpO2 95%  I have reviewed nursing notes and vital signs. I personally reviewed the imaging tests through PACS system  I reviewed available ER/hospitalization records thought the EMR   Fayrene Helper, [place] 08/24/12 1609

## 2012-08-24 NOTE — ED Notes (Signed)
Pt given Sprite Zero for fluid challenge. 

## 2012-08-24 NOTE — Telephone Encounter (Signed)
Please see routing note 

## 2012-08-24 NOTE — ED Notes (Signed)
While sleeping, patient's O2 sats at 86%. Patient placed on 2L via [place], sats at 94%

## 2012-08-25 LAB — URINE CULTURE
Colony Count: 7000
Colony Count: NO GROWTH
Culture: NO GROWTH

## 2012-08-25 NOTE — ED Provider Notes (Signed)
Medical screening examination/treatment/procedure(s) were performed by non-physician practitioner and as supervising physician I was immediately available for consultation/collaboration.   Glynn Octave, MD 08/25/12 [phone]

## 2012-08-27 NOTE — Telephone Encounter (Signed)
LVM for patient to call back. Appointment set for 1/3 @ 10 am patient to arrive at 9:45 @ 1st floor admitting. This is for the 2D ECHO that dr Tommi Rumps Lawson Radar requested that patient get before her appointment here @ 2:30pm friday

## 2012-08-27 NOTE — Telephone Encounter (Signed)
Patient called and notified of appointment.

## 2012-08-27 NOTE — Telephone Encounter (Signed)
Appointment set for

## 2012-08-28 ENCOUNTER — Encounter: Payer: Self-pay | Admitting: Family Medicine

## 2012-08-28 ENCOUNTER — Ambulatory Visit (HOSPITAL_COMMUNITY)
Admission: RE | Admit: 2012-08-28 | Discharge: 2012-08-28 | Disposition: A | Discharge status: Home / Self Care | Payer: Medicare Other | Source: Ambulatory Visit | Attending: Family Medicine | Admitting: Family Medicine

## 2012-08-28 ENCOUNTER — Ambulatory Visit (INDEPENDENT_AMBULATORY_CARE_PROVIDER_SITE_OTHER): Payer: Medicare Other | Admitting: Family Medicine

## 2012-08-28 VITALS — BP 126/61 | HR 66 | Temp 98.0°F | Wt 231.0 lb

## 2012-08-28 DIAGNOSIS — M545 Low back pain, unspecified: Secondary | ICD-10-CM

## 2012-08-28 DIAGNOSIS — I517 Cardiomegaly: Secondary | ICD-10-CM | POA: Insufficient documentation

## 2012-08-28 DIAGNOSIS — R55 Syncope and collapse: Secondary | ICD-10-CM | POA: Insufficient documentation

## 2012-08-28 DIAGNOSIS — E119 Type 2 diabetes mellitus without complications: Secondary | ICD-10-CM | POA: Insufficient documentation

## 2012-08-28 NOTE — Assessment & Plan Note (Signed)
Resolved.  Patient thinks it was due to UTI and now better with Keflex.  Finish course of antibiotics.  UTI in ED was concerning for infection, however BC negative.  Encouraged hydration.

## 2012-08-28 NOTE — Progress Notes (Signed)
  Subjective:    Patient ID: Kaitlyn Ferguson, female    DOB: 1945-10-07, 67 y.o.   MRN: [id number]  HPI  Patient here for follow ED visit after she had syncopal episode at the gym after an exercise class.  Patient was discharged from ED with close up follow up and 2D Echo to rule out valvular disease.  Patient has been resting and increasing physical activity slowly.  She is eager to go back to the gym.  Denies any dizziness, lightheadedness.  Denies any heart palpitations or chest pain or SOB.  Here to discuss results of 2D Echo.  Patient found to have UTI in ED - was started on Keflex.  BC negative.  Since starting Keflex, back pain that started in mid-December has improved.  Denies any dysuria or burning with urination.  Denies any nausea/vomiting.  Review of Systems  Per HPI    Objective:   Physical Exam  Constitutional: She appears well-nourished. No distress.  Cardiovascular: Normal rate and normal heart sounds.   No murmur heard. Pulmonary/Chest: Effort normal.  Neurological: She has normal strength. No cranial nerve deficit or sensory deficit. She displays a negative Romberg sign. Gait normal.       Assessment & Plan:

## 2012-08-28 NOTE — Assessment & Plan Note (Signed)
Syncope likely secondary to dehydration vs. Exertional component at the gym.  No hx of cardiac dysthymia.or heart disease.  Normal orthostatic vitals in ED.  2D Echo performed to rule out valvular disease.  Echo within normal limits.  Okay for patient to resume exercise as long as she stays hydrated and knows her limits.  Patient understands.  Red flags reviewed.

## 2012-08-28 NOTE — Progress Notes (Signed)
  Echocardiogram 2D Echocardiogram has been performed.  Mirl Hillery 08/28/2012, 10:38 AM

## 2012-08-31 ENCOUNTER — Other Ambulatory Visit: Payer: Self-pay | Admitting: Sports Medicine

## 2012-09-03 ENCOUNTER — Ambulatory Visit (INDEPENDENT_AMBULATORY_CARE_PROVIDER_SITE_OTHER): Payer: Medicare Other | Admitting: Pharmacist

## 2012-09-03 ENCOUNTER — Encounter: Payer: Self-pay | Admitting: Pharmacist

## 2012-09-03 VITALS — BP 121/65 | HR 66 | Ht 67.0 in | Wt 236.0 lb

## 2012-09-03 DIAGNOSIS — E119 Type 2 diabetes mellitus without complications: Secondary | ICD-10-CM

## 2012-09-03 DIAGNOSIS — I10 Essential (primary) hypertension: Secondary | ICD-10-CM

## 2012-09-03 NOTE — Progress Notes (Signed)
  Subjective:    Patient ID: Kaitlyn Ferguson, female    DOB: 02-07-1946, 67 y.o.   MRN: [id number]  HPI Pt arrives to clinic in good spirits. She brought her blood glucose log with her sugars ranging from 100-207.  Most fasting am readings were in the 140s range, trending down to 130s in the past week. Denies hypoglycemic episodes.  She continues her diet and exercise regimen in effort to lose weight.  She achieved her goal to maintain her weight through the holidays (she was 240 before Thanksgiving).  Reports she was working out at [employer] when she passed out and was found to be dehydrated and had a UTI.  She reports her glucose was 155 at that time. She started returning to the gym on Saturday.  Her next short term goal is to lose 20 lbs (goal weight 215 lbs) by her grandson's wedding on April 26th.  She states her long term goal weight is 170 lbs.    Pt also complaining of increased dizziness on positional change with low blood pressure in hospital and at neighborhood recreational center.   Review of Systems     Objective:   Physical Exam        Assessment & Plan:  Diabetes: Long-standing diabetes currently under good control of blood glucose based on most recent A1c of 6.8, home fasting CBG readings mostly in 130s-150s. Control is stable due to continuing to lose weight with lower Lantus doses. Denies hypoglycemic events.  Able to verbalize appropriate hypoglycemia management plan. Decreased dose of basal insulin Lantus (insulin glargine) to 25 units daily and to continue this dose if blood sugars remain less than 155 in the morning. If morning blood sugar more than 155 multiple times in one week, increase to 30 units Lantus. Written patient instructions provided.  Follow up with Dr. Tye Savoy later this month and follow up in Pharmacist Clinic Visit in February.   Total time in face to face counseling 30 minutes.  Patient seen with Doris Cheadle PharmD, Pharmacy Resident.  HTN: Long  standing HTN currently at goal in patient who is currently experiencing orthostatic hypotension with "low" blood pressure readings at multiple locations.  Lowered dose of lisinopril/hctz to 1/2 tablet (10/12.5 mg) daily. Once this prescription runs out, pt to get new prescription.  Will consider use of lisinopril monotherapy as an option. Pt verbalized understanding. Written instructions provided.

## 2012-09-03 NOTE — Patient Instructions (Addendum)
You did great over the holidays!  Decrease your Lantus to 25 units daily. Continue this dose if blood sugars remain less than 155 in the morning.  If morning blood sugar more than 155 multiple times in one week, increase to 30 units Lantus.   Start taking 1/2 tablet of your lisinopril/hydrochlorothiazide (HCTZ). When you run out of these, we can prescribe you a different size pill so you do not have to continuing cutting them. Your blood pressure looks great with the weight you've lost!  Follow up with Dr. Tye Savoy later this month.  At this appointment, schedule follow up with Dr. Raymondo Band in February.

## 2012-09-03 NOTE — Assessment & Plan Note (Signed)
Long-standing diabetes currently under good control of blood glucose based on most recent A1c of 6.8, home fasting CBG readings mostly in 130s-150s. Control is stable due to continuing to lose weight with lower Lantus doses. Denies hypoglycemic events.  Able to verbalize appropriate hypoglycemia management plan. Decreased dose of basal insulin Lantus (insulin glargine) to 25 units daily and to continue this dose if blood sugars remain less than 155 in the morning. If morning blood sugar more than 155 multiple times in one week, increase to 30 units Lantus. Written patient instructions provided.  Follow up with Dr. Tye Savoy later this month and follow up in Pharmacist Clinic Visit in February.   Total time in face to face counseling 30 minutes.  Patient seen with Doris Cheadle PharmD, Pharmacy Resident.

## 2012-09-03 NOTE — Assessment & Plan Note (Signed)
Long standing HTN currently at goal in patient who is currently experiencing orthostatic hypotension with "low" blood pressure readings at multiple locations.  Lowered dose of lisinopril/hctz to 1/2 tablet (10/12.5 mg) daily. Once this prescription runs out, pt to get new prescription.  Will consider use of lisinopril monotherapy as an option. Pt verbalized understanding. Written instructions provided.

## 2012-09-04 NOTE — Progress Notes (Signed)
Patient ID: Kaitlyn Ferguson, female   DOB: 1946/07/19, 68 y.o.   MRN: [id number] Reviewed: Agree with Dr. Macky Lower documentation and management.

## 2012-09-14 ENCOUNTER — Encounter: Payer: Self-pay | Admitting: Family Medicine

## 2012-09-14 ENCOUNTER — Ambulatory Visit (INDEPENDENT_AMBULATORY_CARE_PROVIDER_SITE_OTHER): Payer: Medicare Other | Admitting: Family Medicine

## 2012-09-14 VITALS — BP 137/62 | HR 67 | Temp 98.0°F | Wt 235.0 lb

## 2012-09-14 DIAGNOSIS — L918 Other hypertrophic disorders of the skin: Secondary | ICD-10-CM

## 2012-09-14 DIAGNOSIS — L909 Atrophic disorder of skin, unspecified: Secondary | ICD-10-CM

## 2012-09-14 MED ORDER — HYDROCHLOROTHIAZIDE 12.5 MG PO TABS: 12.5000 mg | ORAL_TABLET | Freq: Every day | ORAL | Status: DC

## 2012-09-14 NOTE — Progress Notes (Signed)
  Subjective:    Patient ID: Kaitlyn Ferguson, female    DOB: 1946/08/13, 67 y.o.   MRN: [id number]  HPI  Patient presents to clinic for removal of skin tags.  She has two tags that are bothersome.  She had several skin tags on her neck that were removed in the past.  Discussed risks/benefits of procedure and discussed other treatment options if liquid nitrogen did not work this time.    Procedure note:  Written onsent obtained.  Patient understood and agreed to risks and benefits of procedure.  Two skin tags were cleaned with alcohol swabs.  Liquid nitrogen obtained.  Cotton swabs were dipped in liquid nitrogen and applied to skin tags until a dry, white halo formed.  Patient tolerated procedure well.    Review of Systems  Per HPI    Objective:   Physical Exam  Constitutional: She appears well-nourished. No distress.  HENT:  Head: Normocephalic and atraumatic.  Skin:       Face: two skin tags - one located lateral to RT eye and the other on LT side of chin below ear.  RT skin tag had a thick stalk and larger than LT skin tag.  No redness or irritation.        Assessment & Plan:

## 2012-09-14 NOTE — Patient Instructions (Addendum)
It was good to see you again, Kaitlyn Ferguson. If you develop bleeding that does not resolve or swelling or pus drainage, please call me. Give it about 1-2 weeks to let the tags fall off.  If they don't fall off, please return to clinic.

## 2012-09-16 ENCOUNTER — Other Ambulatory Visit: Payer: Self-pay | Admitting: Family Medicine

## 2012-09-18 ENCOUNTER — Encounter: Payer: Self-pay | Admitting: Family Medicine

## 2012-09-18 DIAGNOSIS — L918 Other hypertrophic disorders of the skin: Secondary | ICD-10-CM | POA: Insufficient documentation

## 2012-09-20 ENCOUNTER — Other Ambulatory Visit: Payer: Self-pay | Admitting: Family Medicine

## 2012-10-12 ENCOUNTER — Other Ambulatory Visit: Payer: Self-pay | Admitting: *Deleted

## 2012-10-13 ENCOUNTER — Ambulatory Visit (INDEPENDENT_AMBULATORY_CARE_PROVIDER_SITE_OTHER): Payer: Medicare Other | Admitting: Pharmacist

## 2012-10-13 ENCOUNTER — Encounter: Payer: Self-pay | Admitting: Pharmacist

## 2012-10-13 VITALS — Ht 67.0 in | Wt 232.0 lb

## 2012-10-13 DIAGNOSIS — E119 Type 2 diabetes mellitus without complications: Secondary | ICD-10-CM

## 2012-10-13 DIAGNOSIS — E669 Obesity, unspecified: Secondary | ICD-10-CM

## 2012-10-13 MED ORDER — OMEPRAZOLE 40 MG PO CPDR: 40.0000 mg | DELAYED_RELEASE_CAPSULE | Freq: Every day | ORAL | Status: DC

## 2012-10-13 NOTE — Assessment & Plan Note (Signed)
Continues to work on weight loss plan with exercise and diet plan.  She has a goal to lose 10 pounds by April 2014

## 2012-10-13 NOTE — Assessment & Plan Note (Signed)
Long-standing diabetes currently under good control of blood glucose based on most recent A1c of 6.8 in December, home fasting CBG readings mostly in 130s-150s. Control is stable due to continuing to lose weight with lower Lantus doses. Denies hypoglycemic events. Able to verbalize appropriate hypoglycemia management plan. Will continue dose of basal insulin Lantus (insulin glargine) at 30 units daily. Will continue diet and more frequent exercise with goal weight loss of 10 lbs by next visit.  Written patient instructions provided. Follow up with Dr. Tye Savoy early next month and follow up in Pharmacist Clinic in 4-6 weeks. Total time in face to face counseling 30 minutes. Patient seen with Drue Stager PharmD, Pharmacy Resident.

## 2012-10-13 NOTE — Patient Instructions (Addendum)
It was nice seeing you today, you have made some great progress  1. Continue the good work with your weight loss with a goal of losing 10 lbs by the next visit 2. Continue your current diabetes medications and keep checking your blood sugar 3. Start your exercise classes (tai chi and zumba) 4. We will see you back in the beginning of April

## 2012-10-13 NOTE — Progress Notes (Signed)
HPI  Pt arrives to clinic in good spirits. She brought her blood glucose log with her sugars ranging from 100-180. Most fasting am readings were in the 130-140 range. Denies hypoglycemic episodes. At last visit her Lantus was reduced to 25 units daily, but she felt her BG went too high when she did this and went back to 30 units daily. She continues her diet and exercise regimen in effort to lose weight. She has sucessfully lost a 3 pounds since her last visit despite difficulty getting to the gym with the recent weather. Her next short term goal is to lose 10 lbs (goal weight 222 lbs) by her grandson's wedding on April 26th. She states her long term goal weight is 170 lbs.    Assessment & Plan:    Long-standing diabetes currently under good control of blood glucose based on most recent A1c of 6.8 in December, home fasting CBG readings mostly in 130s-150s. Control is stable due to continuing to lose weight with lower Lantus doses. Denies hypoglycemic events. Able to verbalize appropriate hypoglycemia management plan. Will continue dose of basal insulin Lantus (insulin glargine) at 30 units daily. Will continue diet with increased exercise level. Written patient instructions provided. Follow up with Dr. Tye Savoy early next month and follow up in Pharmacist Clinic in 4-6 weeks. Total time in face to face counseling 30 minutes. Patient seen with Drue Stager PharmD, Pharmacy Resident.

## 2012-10-14 NOTE — Progress Notes (Signed)
Patient ID: Kaitlyn Ferguson, female   DOB: 12/27/1945, 66 y.o.   MRN: [id number] Reviewed:  Agree with Dr. Koval's documentation and management. 

## 2012-10-28 ENCOUNTER — Encounter: Payer: Self-pay | Admitting: Family Medicine

## 2012-10-28 ENCOUNTER — Ambulatory Visit (INDEPENDENT_AMBULATORY_CARE_PROVIDER_SITE_OTHER): Payer: Medicare Other | Admitting: Family Medicine

## 2012-10-28 VITALS — BP 179/66 | HR 68 | Ht 67.0 in | Wt 232.4 lb

## 2012-10-28 DIAGNOSIS — B351 Tinea unguium: Secondary | ICD-10-CM

## 2012-10-28 DIAGNOSIS — E119 Type 2 diabetes mellitus without complications: Secondary | ICD-10-CM

## 2012-10-28 LAB — POCT GLYCOSYLATED HEMOGLOBIN (HGB A1C): Hemoglobin A1C: 7.3

## 2012-10-28 MED ORDER — PREGABALIN 50 MG PO CAPS: 50.0000 mg | ORAL_CAPSULE | Freq: Three times a day (TID) | ORAL | Status: DC

## 2012-10-28 NOTE — Patient Instructions (Addendum)
Good to see you today, Kaitlyn Ferguson. Please stop taking Gabapentin and start taking Lyrica 50 mg three times per day. Schedule follow up appointment with Dr. Raymondo Band, then schedule appointment with me in 3 months. I will refer you to Podiatry to evaluate your toenails.

## 2012-10-28 NOTE — Progress Notes (Signed)
  Subjective:     Kaitlyn Ferguson is a 67 y.o. female who presents for follow up of diabetes.. Current symptoms include: hyperglycemia and paresthesia of the feet.  Take Gabapentin 800 mg BID, without any relief.  Patient denies hypoglycemia , nausea, visual disturbances and vomiting. Evaluation to date has been: hemoglobin A1C and checks CBG twice per day. Home sugars: BGs range between 100 and 204. Current treatments: Lantus 30 units daily in AM and Victoza 1.8 in AM. Last dilated eye exam: June 2013.  Has an appointment this June.  Toenail fungus: Patient says this has been going on for several months, but now toenails are become more thickened and discolored.  She denies any toe pain.  Denies any diminished sensation.  She takes Lyrica for diabetic neuropathy.  She denies any pus drainage from toe nails or swelling or redness.  No associated fevers.  Review of Systems Per HPI   Objective:    BP 179/66  Pulse 68  Ht 5\' 7"  (1.702 m)  Wt 232 lb 6.4 oz (105.416 kg)  BMI 36.39 kg/m2 General appearance: alert and cooperative Lungs: clear to auscultation bilaterally Heart: regular rate and rhythm, S1, S2 normal, no murmur, click, rub or gallop Extremities: extremities normal, atraumatic, no cyanosis or edema Pulses: 2+ and symmetric Skin: toenails are rigid and thick; second toenails bilaterally are greyish in color; no signs of infection; normal sensation; normal microfilament testing    Assessment:    Diabetes mellitus Type II, under excellent control.    Plan:

## 2012-10-29 ENCOUNTER — Encounter: Payer: Self-pay | Admitting: Family Medicine

## 2012-10-29 DIAGNOSIS — B351 Tinea unguium: Secondary | ICD-10-CM | POA: Insufficient documentation

## 2012-10-29 NOTE — Assessment & Plan Note (Signed)
Multiple toenails appear thick and hyperpigmented.  Due to hx of diabetic neuropathy and possible onychomycosis, will refer to Podiatry.  Patient did not seem interested in taking PO terbinafine x 12 weeks.  Will follow up Podiatry consult.  Appreciate recommendations.

## 2012-10-29 NOTE — Assessment & Plan Note (Signed)
Alc increased to 7.3 from 6.8.  Per patient, CBG range from 120 to 204 likely due to diet.  She is compliant with medications.  She continues to exercise, weight is stable.  Will continue current regimen and discussed CHO modified diet and weight loss to help decrease BG.  Patient will follow up with Dr. Raymondo Band next month and me in 3 months.  Will also refer to Podiatry.

## 2012-12-03 ENCOUNTER — Encounter: Payer: Self-pay | Admitting: Pharmacist

## 2012-12-03 ENCOUNTER — Ambulatory Visit (INDEPENDENT_AMBULATORY_CARE_PROVIDER_SITE_OTHER): Payer: Medicare Other | Admitting: Pharmacist

## 2012-12-03 VITALS — BP 162/83 | HR 65 | Ht 68.0 in | Wt 230.0 lb

## 2012-12-03 DIAGNOSIS — I10 Essential (primary) hypertension: Secondary | ICD-10-CM

## 2012-12-03 DIAGNOSIS — E119 Type 2 diabetes mellitus without complications: Secondary | ICD-10-CM

## 2012-12-03 MED ORDER — LISINOPRIL-HYDROCHLOROTHIAZIDE 10-12.5 MG PO TABS: 1.0000 | ORAL_TABLET | Freq: Every day | ORAL | Status: DC

## 2012-12-03 NOTE — Progress Notes (Signed)
  Subjective:    Patient ID: Kaitlyn Ferguson, female    DOB: 06-07-1946, 67 y.o.   MRN: [id number]  HPI Patient arrives in good spirits.   States she has been walking 3-4 days per week, working out at [employer] 4 days per week AND is also doing a work-out class in her neighborhood.   She was happy to lose an additional 2 pounds since her last visit.   She has now lost > 25lbs!  CBGs at home 100-180 fasting and post prandials with rare > 200 readings.   She has minimal # readings < 100.  Review of Systems     Objective:   Physical Exam        Assessment & Plan:  Continued excellent control of CBGs in patient motivated to lose weight who continues to perform routine exercise.    She is continuing to take victoza and lantus at this time. No changes in med regimen for CBGs at this time.  Continue to support weight loss plan.   BP elevated likely related to s/c of lisinopril and lowering of dose of HCTZ.   Reinitiate combination of lisinopril/HCTZ at lower dose 10/12.5mg  daily.   Anticipate this will provide adequate control - Follow up in 1 month in Rx Clinic. TTFFC - 20 minutes.   See with Juanita Craver, PharmD candidate.

## 2012-12-03 NOTE — Assessment & Plan Note (Signed)
Continued excellent control of CBGs in patient motivated to lose weight who continues to perform routine exercise.    She is continuing to take victoza and lantus at this time. No changes in med regimen for CBGs at this time.  Continue to support weight loss plan.   BP elevated likely related to s/c of lisinopril and lowering of dose of HCTZ.   Reinitiate combination of lisinopril/HCTZ at lower dose 10/12.5mg  daily.   Anticipate this will provide adequate control - Follow up in 1 month in Rx Clinic. TTFFC - 20 minutes.   See with Juanita Craver, PharmD candidate

## 2012-12-03 NOTE — Assessment & Plan Note (Signed)
BP elevated likely related to s/c of lisinopril and lowering of dose of HCTZ.   Reinitiate combination of lisinopril/HCTZ at lower dose 10/12.5mg  daily.   Anticipate this will provide adequate control - Follow up in 1 month in Rx Clinic. TTFFC - 20 minutes.   See with Juanita Craver, PharmD candidate

## 2012-12-03 NOTE — Patient Instructions (Addendum)
Keep up your exercise and weight loss plan.   You are doing great!!!  No change in your insulin regimen today.   Same dose of Victoza.   Take ONE of the new combination Blood pressure pills every day.   Goal for you weight prior to the wedding is 5 more pounds.   Next appointment in Rx clinic with Dr. Raymondo Band in May.

## 2012-12-04 NOTE — Progress Notes (Signed)
Patient ID: Kaitlyn Ferguson, female   DOB: 04/09/1946, 66 y.o.   MRN: [id number] Reviewed:  Agree with Dr. Koval's documentation and management. 

## 2012-12-31 ENCOUNTER — Ambulatory Visit (INDEPENDENT_AMBULATORY_CARE_PROVIDER_SITE_OTHER): Payer: Medicare Other | Admitting: Pharmacist

## 2012-12-31 VITALS — BP 147/68 | HR 67 | Wt 231.8 lb

## 2012-12-31 DIAGNOSIS — E119 Type 2 diabetes mellitus without complications: Secondary | ICD-10-CM

## 2012-12-31 NOTE — Progress Notes (Signed)
  Subjective:    Patient ID: Kaitlyn Ferguson, female    DOB: Jan 14, 1946, 67 y.o.   MRN: [id number]  HPI Patient arrives in good spirits, been working out at [employer].  CBGs at home 100-180 fasting and post prandials. Minimal readings <100.  Review of Systems     Objective:   Physical Exam        Assessment & Plan:  Longstanding Diabetes with continued good control of CBGs.  She continues to perform routine exercise. She is continuing to take victoza and lantus at this time. No changes in med regimen for CBGs at this time. Continue to support weight loss plan. Patient is motivated to lose weight (her goal 229lbs or less by next visit with Dr. Tye Savoy and 225 lbs or less by early July Rx Clinic follow up).  Total time in face to face counseling 10 minutes.  Patient seen with Richrd Humbles, PharmD candidate.

## 2012-12-31 NOTE — Assessment & Plan Note (Signed)
Longstanding Diabetes with continued good control of CBGs.  She continues to perform routine exercise. She is continuing to take victoza and lantus at this time. No changes in med regimen for CBGs at this time. Continue to support weight loss plan. Patient is motivated to lose weight (her goal 229lbs or less by next visit with Dr. Tye Savoy and 225 lbs or less by early July Rx Clinic follow up).  Total time in face to face counseling 10 minutes.  Patient seen with Richrd Humbles, PharmD candidate.

## 2012-12-31 NOTE — Patient Instructions (Addendum)
Thank you for coming in.  Great job on your weight loss. Continue diabetes medication, diet, and exercise.  Follow-up in pharmacy clinic in early July.

## 2013-01-01 ENCOUNTER — Encounter: Payer: Self-pay | Admitting: Pharmacist

## 2013-01-01 NOTE — Progress Notes (Signed)
Patient ID: Kaitlyn Ferguson, female   DOB: 02-17-1946, 67 y.o.   MRN: [id number] Reviewed:  Agree with Dr. Macky Lower documentation and management.

## 2013-01-07 ENCOUNTER — Other Ambulatory Visit: Payer: Self-pay | Admitting: Sports Medicine

## 2013-01-19 ENCOUNTER — Other Ambulatory Visit: Payer: Self-pay | Admitting: *Deleted

## 2013-01-19 MED ORDER — OMEPRAZOLE 40 MG PO CPDR: 40.0000 mg | DELAYED_RELEASE_CAPSULE | Freq: Every day | ORAL | Status: DC

## 2013-01-25 ENCOUNTER — Encounter: Payer: Self-pay | Admitting: Family Medicine

## 2013-01-25 ENCOUNTER — Ambulatory Visit (INDEPENDENT_AMBULATORY_CARE_PROVIDER_SITE_OTHER): Payer: Medicare Other | Admitting: Family Medicine

## 2013-01-25 VITALS — BP 149/57 | HR 70 | Temp 99.2°F | Ht 67.0 in | Wt 219.0 lb

## 2013-01-25 DIAGNOSIS — H8110 Benign paroxysmal vertigo, unspecified ear: Secondary | ICD-10-CM

## 2013-01-25 DIAGNOSIS — E119 Type 2 diabetes mellitus without complications: Secondary | ICD-10-CM

## 2013-01-25 LAB — TSH: TSH: 0.602 u[IU]/mL (ref 0.350–4.500)

## 2013-01-25 LAB — POCT GLYCOSYLATED HEMOGLOBIN (HGB A1C): Hemoglobin A1C: 6.5

## 2013-01-25 NOTE — Patient Instructions (Addendum)
You have lost 12 lb in the last month. Decrease Lantus to 20 units daily and Victoza 1.2 daily. Schedule follow up appointment with Dr. Raymondo Band in early July. If your dizziness or nausea worsens, please return to clinic. Keep up the good work!

## 2013-01-25 NOTE — Progress Notes (Signed)
  Subjective:    Patient ID: Kaitlyn Ferguson, female    DOB: 11/11/1945, 67 y.o.   MRN: [id number]  HPI  Patient presents to clinic for diabetes follow up.  She has lost about 12 lb in one month.  She says it is intentional.  Patient says she goes to gym 5 days per week.  She has cut back on portion size.  She has cut out all desserts and sweets.  She does eat fresh fruits and increase fresh vegetables.  She still eats white rice and wheat bread, but she eats small portions.  CBG lowest 78 (in May) and highest 178 (in May).  She continues to take Lantus 25 units daily and Victoza 1.8 daily.    She has been getting dizzy and complains of HA this month.  Mostly happens when changing positions.  She says the room is spinning, but no ringing in ears.  This has been an ongoing issue for about 1 year.  No hx of recent viral illness.   We reviewed medications, she is no longer taking Gabapentin or Lyrica.  I offered starting Antivert or PT for vertigo, but she does not want to take another medication right now.  No falls, no syncopal or near syncopal episodes.   Review of Systems Per HPI    Objective:   Physical Exam  Constitutional: She appears well-nourished. No distress.  Cardiovascular: Normal rate.   No murmur heard. Pulmonary/Chest: Effort normal and breath sounds normal.  Neurological:  Grossly normal  Skin: No rash noted.  Feet: no open wounds or ulcers; normal sensation and strong pulses B/L      Assessment & Plan:

## 2013-01-26 ENCOUNTER — Encounter: Payer: Self-pay | Admitting: Family Medicine

## 2013-01-26 DIAGNOSIS — H8110 Benign paroxysmal vertigo, unspecified ear: Secondary | ICD-10-CM | POA: Insufficient documentation

## 2013-01-26 NOTE — Assessment & Plan Note (Addendum)
Diabetes, CBG well-controlled.  HA1c today 6.5.  She has lost about 10 lb in the last month intentionally.  Lowest CBG this month was 7.8.  Plan to cut back on Victoza to 1.2 daily and Lantus to 20 units daily.  Patient has a follow up appointment with Dr. Raymondo Band.  Follow up with new PCP in 3 months for diabetes check up.

## 2013-01-26 NOTE — Assessment & Plan Note (Signed)
Unsure why she feels dizzy and nauseated.  Could be BPPV given hx of worsening symptoms with positional changes.  BP elevated today.  Will check TSH since it has been awhile since it was done and she continues to take Synthroid.  Offered a trial of Antivert, but patient declined and wants to see if it will get better on its own.  Patient to follow up with me if symptoms worsen.  Could consider referral to ENT or PT for vestibular therapy.

## 2013-02-08 ENCOUNTER — Other Ambulatory Visit: Payer: Self-pay | Admitting: *Deleted

## 2013-02-08 MED ORDER — ROSUVASTATIN CALCIUM 20 MG PO TABS: 20.0000 mg | ORAL_TABLET | Freq: Every day | ORAL | Status: DC

## 2013-02-22 ENCOUNTER — Other Ambulatory Visit: Payer: Self-pay | Admitting: Family Medicine

## 2013-03-02 ENCOUNTER — Ambulatory Visit: Payer: Medicare Other | Admitting: Pharmacist

## 2013-03-04 ENCOUNTER — Other Ambulatory Visit: Payer: Self-pay

## 2013-03-18 ENCOUNTER — Ambulatory Visit (INDEPENDENT_AMBULATORY_CARE_PROVIDER_SITE_OTHER): Payer: Medicare Other | Admitting: Pharmacist

## 2013-03-18 ENCOUNTER — Encounter: Payer: Self-pay | Admitting: Pharmacist

## 2013-03-18 VITALS — BP 165/85 | HR 61 | Ht 67.0 in | Wt 224.0 lb

## 2013-03-18 DIAGNOSIS — E119 Type 2 diabetes mellitus without complications: Secondary | ICD-10-CM

## 2013-03-18 NOTE — Assessment & Plan Note (Signed)
Diabetes currently with . Lab Results  Component Value Date   HGBA1C 6.5 01/25/2013   AND home fasting CBG readings of 110-165 and 2 hour post-prandial/random CBG readings of 130-170.  Denies hypoglycemic events and is able to verbalize appropriate hypoglycemia management plan.  Reports adherence with medication. Control is suboptimal due to recent dietary indiscretion including eating watermelon with salt.  Continued same dose Lantus (20 units QAM)  Increased dose of incretin GLP agonist, Victoza (liraglutide) to 1.8mg  daily QAM as this was helping with weight loss.   Goal for weight loss is 200lbs by the end of the year.   Blood pressure higher than goal likely due to recent high intake of salt.  Reevaluate need to increase BP med at next visit. Written patient instructions provided.  Follow up in Pharmacist Clinic Visit 1 month AFTER visit with Dr. Paulina Fusi (new provider).   Total time in face to face counseling 25 minutes.

## 2013-03-18 NOTE — Progress Notes (Signed)
S:    Patient arrives in good spirits.  She reports today would have been her 50th wedding anniversary and her husband passed away about 4 years ago.  She also notes that her mother passed way 14 years ago today.  She is handling today very well give the emotional issues involved.   She presents to the clinic for diabetes for follow up of her weight and DM control.   At her last visit Dr. Tye Savoy decreased her Vicotza from 1.2 to 1.8 AND decreased her Lantus from 30 to 20 units daily.    She was unhappy to hear that her weight had increased since her last visit.   She continues to work on a weight goal of 170lbs.  She reports her lifetime maximal weight to be near 300lbs.     O:  A1c Due September  A/P: Diabetes currently with . Lab Results  Component Value Date   HGBA1C 6.5 01/25/2013   AND home fasting CBG readings of 110-165 and 2 hour post-prandial/random CBG readings of 130-170.  Denies hypoglycemic events and is able to verbalize appropriate hypoglycemia management plan.  Reports adherence with medication. Control is suboptimal due to recent dietary indiscretion including eating watermelon with salt.  Continued same dose Lantus (20 units QAM)  Increased dose of incretin GLP agonist, Victoza (liraglutide) to 1.8mg  daily QAM as this was helping with weight loss.   Goal for weight loss is 200lbs by the end of the year.   Blood pressure higher than goal likely due to recent high intake of salt.  Reevaluate need to increase BP med at next visit. Written patient instructions provided.  Follow up in Pharmacist Clinic Visit 1 month AFTER visit with Dr. Paulina Fusi (new provider).   Total time in face to face counseling 25 minutes.

## 2013-03-18 NOTE — Patient Instructions (Addendum)
Continue same dose Lantus (20 units) in the morning.   Increase Victoza to 1.8mg  once daily in the morning.   Try to cut down on your salt.   Continue to exercise.   Next visit with Dr. Paulina Fusi.   Back to Rx clinic the following month.

## 2013-03-19 NOTE — Progress Notes (Signed)
Patient ID: Kaitlyn Ferguson, female   DOB: Jan 10, 1946, 67 y.o.   MRN: [id number] Reviewed: Agree with Dr. Macky Lower management and documentation.

## 2013-03-27 ENCOUNTER — Other Ambulatory Visit: Payer: Self-pay | Admitting: Family Medicine

## 2013-04-04 ENCOUNTER — Other Ambulatory Visit: Payer: Self-pay | Admitting: Family Medicine

## 2013-04-05 ENCOUNTER — Other Ambulatory Visit: Payer: Self-pay | Admitting: Family Medicine

## 2013-04-05 MED ORDER — ROSUVASTATIN CALCIUM 20 MG PO TABS: ORAL_TABLET | ORAL | Status: DC

## 2013-04-08 ENCOUNTER — Other Ambulatory Visit: Payer: Self-pay

## 2013-04-08 DIAGNOSIS — Z1231 Encounter for screening mammogram for malignant neoplasm of breast: Secondary | ICD-10-CM

## 2013-04-30 ENCOUNTER — Ambulatory Visit (INDEPENDENT_AMBULATORY_CARE_PROVIDER_SITE_OTHER): Payer: Medicare Other | Admitting: Family Medicine

## 2013-04-30 ENCOUNTER — Encounter: Payer: Self-pay | Admitting: Family Medicine

## 2013-04-30 VITALS — BP 151/49 | HR 65 | Temp 99.0°F | Ht 67.0 in | Wt 226.0 lb

## 2013-04-30 DIAGNOSIS — E119 Type 2 diabetes mellitus without complications: Secondary | ICD-10-CM

## 2013-04-30 DIAGNOSIS — M159 Polyosteoarthritis, unspecified: Secondary | ICD-10-CM

## 2013-04-30 LAB — POCT GLYCOSYLATED HEMOGLOBIN (HGB A1C): Hemoglobin A1C: 6.9

## 2013-04-30 MED ORDER — MELOXICAM 15 MG PO TABS: 15.0000 mg | ORAL_TABLET | Freq: Every day | ORAL | Status: DC

## 2013-04-30 NOTE — Assessment & Plan Note (Signed)
A1C stable today at 6.9, continue current regimen.  Denies hypoglycemic episodes.

## 2013-04-30 NOTE — Patient Instructions (Signed)
Meloxicam tablets What is this medicine? MELOXICAM (mel OX i cam) is a non-steroidal anti-inflammatory drug (NSAID). It is used to reduce swelling and to treat pain. It may be used for osteoarthritis, rheumatoid arthritis, or juvenile rheumatoid arthritis. This medicine may be used for other purposes; ask your health care provider or pharmacist if you have questions. What should I tell my health care provider before I take this medicine? They need to know if you have any of these conditions: -asthma -cigarette smoker -coronary artery bypass graft (CABG) surgery within the past 2 weeks -drink more than 3 alcohol-containing drinks a day -heart disease or circulation problems such as heart failure or leg edema (fluid retention) -hemophilia or bleeding problems -high blood pressure -kidney disease -liver disease -stomach bleeding or ulcers -an unusual or allergic reaction to meloxicam, aspirin, other NSAIDs, other medicines, foods, dyes, or preservatives -pregnant or trying to get pregnant -breast-feeding How should I use this medicine? Take this medicine by mouth with a full glass of water. Follow the directions on the prescription label. Take this medicine in an upright or sitting position. If possible take bedtime doses at least 10 minutes before lying down. You can take it with or without food. If it upsets your stomach, take it with food. Take your medicine at regular intervals. Do not take it more often than directed. A special MedGuide will be given to you by the pharmacist with each prescription and refill. Be sure to read this information carefully each time. Talk to your pediatrician regarding the use of this medicine in children. Special care may be needed. Elderly patients over 62 years old may have a stronger reaction to this medicine and need smaller doses. Overdosage: If you think you have taken too much of this medicine contact a poison control center or emergency room at  once. NOTE: This medicine is only for you. Do not share this medicine with others. What if I miss a dose? If you miss a dose, take it as soon as you can. If it is almost time for your next dose, take only that dose. Do not take double or extra doses. What may interact with this medicine? -alcohol -aspirin -cidofovir -diuretics -lithium -medicines for high blood pressure -methotrexate -other drugs for inflammation like ketorolac, ibuprofen, and prednisone -pemetrexed -warfarin This list may not describe all possible interactions. Give your health care provider a list of all the medicines, herbs, non-prescription drugs, or dietary supplements you use. Also tell them if you smoke, drink alcohol, or use illegal drugs. Some items may interact with your medicine. What should I watch for while using this medicine? Tell your doctor or healthcare professional if your pain does not get better. Talk to your doctor before taking another medicine for pain. Do not treat yourself. This medicine does not prevent heart attack or stroke. If you take aspirin to prevent heart attack or stroke, talk with your doctor or health care professional. Do not take medicines such as ibuprofen and naproxen with this medicine. Side effects such as stomach upset, nausea, or ulcers may be more likely to occur. Many medicines available without a prescription should not be taken with this medicine. What side effects may I notice from receiving this medicine? Side effects that you should report to your doctor or health care professional as soon as possible: -black or bloody stools, blood in the urine or vomit -blurred vision -chest pain -difficulty breathing or wheezing -nausea or vomiting -skin rash, skin redness, blistering or peeling skin,  hives, or itching -slurred speech or weakness on one side of the body -swelling of eyelids, throat, lips -unexplained weight gain or swelling -unusually weak or tired -yellowing of  eyes or skin Side effects that usually do not require medical attention (report to your doctor or health care professional if they continue or are bothersome): -constipation or diarrhea -dizziness -gas or heartburn -stomach pain This list may not describe all possible side effects. Call your doctor for medical advice about side effects. You may report side effects to FDA at 1-800-FDA-1088. Where should I keep my medicine? Keep out of the reach of children. Store at room temperature between 15 and 30 degrees C (59 and 86 degrees F). Protect from moisture. Keep container tightly closed. Throw away any unused medicine after the expiration date. NOTE: This sheet is a summary. It may not cover all possible information. If you have questions about this medicine, talk to your doctor, pharmacist, or health care provider.  2013, [employer]. (12/04/2009 9:15:42 PM)

## 2013-04-30 NOTE — Assessment & Plan Note (Signed)
Pt with previous History of L knee OA.  Will get B/L weight bearing, 30 degree flexion, lateral, sunrise views and see back in 3-4 weeks.  Options include and detailed for her include PT/Medication/Injection/Knee brace (unloading which she is asking about)/and referral for TKA.  Will try Mobic today and continue with exercise as tolerated.

## 2013-04-30 NOTE — Progress Notes (Signed)
Kaitlyn Ferguson is a 67 y.o. female who presents today for DM II, controlled and L knee pain.  L Knee pain - Told she had arthritis a long time ago, no x-rays taken.  Continues to work out at the way, pain worse after activity and at night and in the morning.  She tried OTC aleve, QID, w/ minimal relief.  Pain has worsened, especially with activity, up and down stairs, over the last week.  Denies any recent trauma, injury, or previous surgery to either knees.   Dm II Controlled - Stable, on Lantus, Victoza.  Denies hypoglycemic events.    Past Medical History  Diagnosis Date  . Arthritis   . Diabetes mellitus   . Cholelithiasis   . Hyperlipidemia   . Hypothyroidism   . Obesity   . DJD (degenerative joint disease) 10/07/2008  . Osteomyelitis of ankle     Complications of Right ankle injury  . DJD (degenerative joint disease)   . Hypertension   . Neuropathy     History  Smoking status  . Never Smoker   Smokeless tobacco  . Never Used    Comment: No tobacco currently    Family History  Problem Relation Age of Onset  . Diabetes Brother   . Diabetes Sister     DM2-lost weight-now okay  . Heart attack Father     First MI- early 18's  . Stroke Mother   . Heart failure Mother   . Multiple myeloma Mother     Deceased at 31 due to same    Current Outpatient Prescriptions on File Prior to Visit  Medication Sig Dispense Refill  . aspirin 81 MG tablet Take 81 mg by mouth at bedtime.       . B-D ULTRA-FINE 33 LANCETS MISC Use as directed  100 each  11  . insulin glargine (LANTUS SOLOSTAR) 100 UNIT/ML injection Inject 20 Units into the skin every morning.       . Insulin Syringe-Needle U-100 31G X 5/16" 1 ML MISC Use with insulin as directed.  100 each  11  . levothyroxine (SYNTHROID, LEVOTHROID) 112 MCG tablet Take 1 tablet (112 mcg total) by mouth daily.  90 tablet  4  . Liraglutide (VICTOZA) 18 MG/3ML SOLN injection Inject 1.8 mg into the skin daily.       Marland Kitchen  lisinopril-hydrochlorothiazide (PRINZIDE,ZESTORETIC) 10-12.5 MG per tablet Take 1 tablet by mouth daily.  90 tablet  3  . omeprazole (PRILOSEC) 40 MG capsule take 1 capsule by mouth once daily  30 capsule  2  . ondansetron (ZOFRAN) 4 MG tablet Take 1 tablet (4 mg total) by mouth every 8 (eight) hours as needed for nausea.  20 tablet  0  . rosuvastatin (CRESTOR) 20 MG tablet take 1 tablet by mouth once daily  30 tablet  1   No current facility-administered medications on file prior to visit.    ROS: Per HPI.  All other systems reviewed and are negative.   Physical Exam Filed Vitals:   04/30/13 1357  BP: 151/49  Pulse: 65  Temp: 99 F (37.2 C)    Physical Examination:  L Knee - + Valgus, + crepitus, Flexion 110-120, Extension 0-5, + medial/lateral joint line tenderness.     Chemistry      Component Value Date/Time   NA 138 08/24/2012 1048   K 3.7 08/24/2012 1048   CL 97 08/24/2012 1048   CO2 28 08/24/2012 1048   BUN 32* 08/24/2012 1048  CREATININE 1.03 08/24/2012 1048   CREATININE 1.02 05/08/2012 1122      Component Value Date/Time   CALCIUM 10.1 08/24/2012 1048   ALKPHOS 90 08/24/2012 1048   AST 16 08/24/2012 1048   ALT 13 08/24/2012 1048   BILITOT 0.4 08/24/2012 1048      Lab Results  Component Value Date   HGBA1C 6.9 04/30/2013     Face to face encounter > 25 minutes including > 50% of the visit going over counseling of options and summarization of options.

## 2013-05-10 ENCOUNTER — Ambulatory Visit
Admission: RE | Admit: 2013-05-10 | Discharge: 2013-05-10 | Disposition: A | Discharge status: Home / Self Care | Payer: Medicare Other | Source: Ambulatory Visit

## 2013-05-10 DIAGNOSIS — Z1231 Encounter for screening mammogram for malignant neoplasm of breast: Secondary | ICD-10-CM

## 2013-05-20 ENCOUNTER — Ambulatory Visit (INDEPENDENT_AMBULATORY_CARE_PROVIDER_SITE_OTHER): Payer: Medicare Other | Admitting: Pharmacist

## 2013-05-20 ENCOUNTER — Encounter: Payer: Self-pay | Admitting: Pharmacist

## 2013-05-20 VITALS — BP 164/86 | HR 66 | Ht 67.0 in | Wt 227.7 lb

## 2013-05-20 DIAGNOSIS — E119 Type 2 diabetes mellitus without complications: Secondary | ICD-10-CM

## 2013-05-20 DIAGNOSIS — I10 Essential (primary) hypertension: Secondary | ICD-10-CM

## 2013-05-20 MED ORDER — LISINOPRIL-HYDROCHLOROTHIAZIDE 20-12.5 MG PO TABS: 1.0000 | ORAL_TABLET | Freq: Every day | ORAL | Status: DC

## 2013-05-20 NOTE — Assessment & Plan Note (Signed)
Patient's BP has been elevated for the last three visits. Currently on Lisinopril 10mg /HCTZ 12.5mg  once daily. We will change this to Lisinopril 20mg /HCTZ 12.5mg  once daily for the time being to assess tolerability. May need to increase in the future due to patient's BP goal of <140/90.

## 2013-05-20 NOTE — Progress Notes (Signed)
S:    Patient arrives well-appearing, in good spirits. Presents for diabetes follow up today. Patient reports having history of Diabetes since the year of 2010, with an A1C that has gotten as high as 10.9 in April of 2012. However, since then, patient has made significant improvement in glucose control, diet, and lifestyle modifications. Exercises about 4 times a week at the [facility] and does Zumba.  Patient is currently on Lantus 20 units SQ every morning and Victoza 1.8mg  once daily.   She is slightly disappointed that her weight is up from her last visit, though it has been on a steady decline for the last two years. Her stated goal at last visit was to be <200 lbs by the end of the year.   O:  Wt 227.7 lb 164/86 HR 66 Last three BP 164/86, 151/49, 165/85 on Lisinopril 10 mg/HCTZ 12.5mg  once daily . Lab Results  Component Value Date   HGBA1C 6.9 04/30/2013  home fasting CBG readings of 130s-140s. Lowest 96  A/P: Diabetes diagnosed in 2010 currently on Lantus 20 units daily and Victoza 1.8mg  once daily. Denies hypoglycemic events and is able to verbalize appropriate hypoglycemia management plan. CBGs in the 130s-140s. Reports adherence with medication. Patient is meeting goals for blood glucose control at this time. No change at this time. Encouraged patient to continue exercising and maintaining healthy lifestyle habits. Goal of <220 lbs by the end of November voiced by patient.  Patient's BP has been elevated for the last three visits. Currently on Lisinopril 10mg /HCTZ 12.5mg  once daily. We will change this to Lisinopril 20mg /HCTZ 12.5mg  once daily for the time being to assess tolerability. May need to increase in the future due to patient's BP goal of <140/90.   Written patient instructions provided.  Follow up in Pharmacist Clinic in November. Total time in face to face counseling 35 minutes.  Patient seen with Valene Bors, PharmD Candidate and Anthony Sar, PharmD Resident.

## 2013-05-20 NOTE — Patient Instructions (Addendum)
Keep up the good work with your diet and exercise plan.   Change BP lisinopril 20mg  / HCTZ 12.5mg   Once daily in the morning.   Weight goal of < 220 lbs by END of November.   Next visit with Dr. Raymondo Band in November.

## 2013-05-20 NOTE — Assessment & Plan Note (Signed)
Diabetes diagnosed in 2010 currently on Lantus 20 units daily and Victoza 1.8mg  once daily. Denies hypoglycemic events and is able to verbalize appropriate hypoglycemia management plan. CBGs in the 130s-140s. Reports adherence with medication. Patient is meeting goals for blood glucose control at this time. No change at this time. Encouraged patient to continue exercising and maintaining healthy lifestyle habits. Goal of <220 lbs by the end of November voiced by patient. Written patient instructions provided.  Follow up in Pharmacist Clinic in November. Total time in face to face counseling 35 minutes.  Patient seen with Valene Bors, PharmD Candidate and Anthony Sar, PharmD Resident.

## 2013-05-26 ENCOUNTER — Ambulatory Visit: Payer: Medicare Other | Admitting: Family Medicine

## 2013-05-27 NOTE — Progress Notes (Signed)
Patient ID: Kaitlyn Ferguson, female   DOB: 01/17/1946, 67 y.o.   MRN: [id number] Reviewed: Agree with Dr. Koval's documentation and management. 

## 2013-05-28 ENCOUNTER — Other Ambulatory Visit: Payer: Self-pay | Admitting: Family Medicine

## 2013-06-01 ENCOUNTER — Other Ambulatory Visit: Payer: Self-pay | Admitting: Family Medicine

## 2013-06-01 MED ORDER — ROSUVASTATIN CALCIUM 20 MG PO TABS: ORAL_TABLET | ORAL | Status: DC

## 2013-06-28 ENCOUNTER — Other Ambulatory Visit: Payer: Self-pay | Admitting: Family Medicine

## 2013-06-28 MED ORDER — OMEPRAZOLE 40 MG PO CPDR: 40.0000 mg | DELAYED_RELEASE_CAPSULE | Freq: Every day | ORAL | Status: DC

## 2013-06-29 ENCOUNTER — Other Ambulatory Visit: Payer: Self-pay | Admitting: Family Medicine

## 2013-08-04 ENCOUNTER — Ambulatory Visit (INDEPENDENT_AMBULATORY_CARE_PROVIDER_SITE_OTHER): Payer: Medicare Other | Admitting: Family Medicine

## 2013-08-04 ENCOUNTER — Encounter: Payer: Self-pay | Admitting: Family Medicine

## 2013-08-04 VITALS — BP 130/60 | HR 66 | Temp 98.9°F | Ht 67.0 in | Wt 225.0 lb

## 2013-08-04 DIAGNOSIS — Z23 Encounter for immunization: Secondary | ICD-10-CM

## 2013-08-04 DIAGNOSIS — E119 Type 2 diabetes mellitus without complications: Secondary | ICD-10-CM

## 2013-08-04 DIAGNOSIS — E785 Hyperlipidemia, unspecified: Secondary | ICD-10-CM

## 2013-08-04 DIAGNOSIS — M159 Polyosteoarthritis, unspecified: Secondary | ICD-10-CM

## 2013-08-04 DIAGNOSIS — I10 Essential (primary) hypertension: Secondary | ICD-10-CM

## 2013-08-04 LAB — BASIC METABOLIC PANEL
BUN: 26 mg/dL — ABNORMAL HIGH (ref 6–23)
CO2: 31 mEq/L (ref 19–32)
Calcium: 9.7 mg/dL (ref 8.4–10.5)
Chloride: 102 mEq/L (ref 96–112)
Creat: 0.87 mg/dL (ref 0.50–1.10)
Glucose, Bld: 162 mg/dL — ABNORMAL HIGH (ref 70–99)
Potassium: 4.1 mEq/L (ref 3.5–5.3)
Sodium: 141 mEq/L (ref 135–145)

## 2013-08-04 LAB — CBC WITH DIFFERENTIAL/PLATELET
Basophils Absolute: 0 10*3/uL (ref 0.0–0.1)
Basophils Relative: 1 % (ref 0–1)
Eosinophils Absolute: 0.1 10*3/uL (ref 0.0–0.7)
Eosinophils Relative: 2 % (ref 0–5)
HCT: 40.6 % (ref 36.0–46.0)
Hemoglobin: 13.4 g/dL (ref 12.0–15.0)
Lymphocytes Relative: 31 % (ref 12–46)
Lymphs Abs: 2.4 10*3/uL (ref 0.7–4.0)
MCH: 25.8 pg — ABNORMAL LOW (ref 26.0–34.0)
MCHC: 33 g/dL (ref 30.0–36.0)
MCV: 78.1 fL (ref 78.0–100.0)
Monocytes Absolute: 0.8 10*3/uL (ref 0.1–1.0)
Monocytes Relative: 11 % (ref 3–12)
Neutro Abs: 4.4 10*3/uL (ref 1.7–7.7)
Neutrophils Relative %: 55 % (ref 43–77)
Platelets: 253 10*3/uL (ref 150–400)
RBC: 5.2 MIL/uL — ABNORMAL HIGH (ref 3.87–5.11)
RDW: 15.3 % (ref 11.5–15.5)
WBC: 7.8 10*3/uL (ref 4.0–10.5)

## 2013-08-04 LAB — POCT GLYCOSYLATED HEMOGLOBIN (HGB A1C): Hemoglobin A1C: 7.2

## 2013-08-04 LAB — LDL CHOLESTEROL, DIRECT: Direct LDL: 56 mg/dL

## 2013-08-04 MED ORDER — MELOXICAM 15 MG PO TABS: 15.0000 mg | ORAL_TABLET | Freq: Every day | ORAL | Status: DC

## 2013-08-04 NOTE — Patient Instructions (Signed)
Kaitlyn Ferguson, it was nice seeing you today.  Please continue to work on your diet and exercise in regards to your diabetes.  For your Knee, please get the x-rays done and continue with the mobic.  Have a good holiday!  Thanks, Dr. Paulina Fusi

## 2013-08-04 NOTE — Progress Notes (Signed)
Kaitlyn Ferguson is a 67 y.o. female who presents today for DM, HTN, and HLD, and L Knee OA   L Knee OA - Pt has been unable to get her x-rays done of her knee, however, she has been able to continue to exercise when taking mobic daily.  Denies any GI upset with the medication.  DM II - Kaitlyn Ferguson's sugars have been slightly elevated in the morning, up from 120s-130s to 150s-160s over the last month.  She continues to be compliant with her Lantus and Vyctoza daily, and denies any hypoglycemic events including any nausea, dizziness, blurred vision, diplopia, nightmares.  HTN - Kaitlyn Ferguson's repeat blood pressure by manual cuff was 130/60, which is at goal for her on her lisinopril/HCTZ.  She is compliant with this medication and she denies any lightheadedness, dizziness, blurred vision, edema, palpitations.   Hyperlipidemia - Compliant with crestor, denies myalgias.   Past Medical History  Diagnosis Date  . Arthritis   . Diabetes mellitus   . Cholelithiasis   . Hyperlipidemia   . Hypothyroidism   . Obesity   . DJD (degenerative joint disease) 10/07/2008  . Osteomyelitis of ankle     Complications of Right ankle injury  . DJD (degenerative joint disease)   . Hypertension   . Neuropathy     History  Smoking status  . Never Smoker   Smokeless tobacco  . Never Used    Comment: No tobacco currently    Family History  Problem Relation Age of Onset  . Diabetes Brother   . Diabetes Sister     DM2-lost weight-now okay  . Heart attack Father     First MI- early 50's  . Stroke Mother   . Heart failure Mother   . Multiple myeloma Mother     Deceased at 28 due to same    Current Outpatient Prescriptions on File Prior to Visit  Medication Sig Dispense Refill  . aspirin 81 MG tablet Take 81 mg by mouth at bedtime.       . B-D ULTRA-FINE 33 LANCETS MISC Use as directed  100 each  11  . insulin glargine (LANTUS SOLOSTAR) 100 UNIT/ML injection Inject 20 Units into the skin every morning.        . Insulin Syringe-Needle U-100 31G X 5/16" 1 ML MISC Use with insulin as directed.  100 each  11  . levothyroxine (SYNTHROID, LEVOTHROID) 112 MCG tablet take 1 tablet by mouth once daily  90 tablet  3  . Liraglutide (VICTOZA) 18 MG/3ML SOLN injection Inject 1.8 mg into the skin daily.       Marland Kitchen lisinopril-hydrochlorothiazide (ZESTORETIC) 20-12.5 MG per tablet Take 1 tablet by mouth daily.  90 tablet  3  . omeprazole (PRILOSEC) 40 MG capsule Take 1 capsule (40 mg total) by mouth daily.  30 capsule  5  . ondansetron (ZOFRAN) 4 MG tablet Take 1 tablet (4 mg total) by mouth every 8 (eight) hours as needed for nausea.  20 tablet  0  . rosuvastatin (CRESTOR) 20 MG tablet take 1 tablet by mouth once daily  30 tablet  5  . VICTOZA 18 MG/3ML SOPN inject 1.8 milligram subcutaneously daily  3 mL  11   No current facility-administered medications on file prior to visit.    ROS: Per HPI.  All other systems reviewed and are negative.   Physical Exam Filed Vitals:   08/04/13 0909  BP: 130/60  Pulse:   Temp:     Physical Examination: General  appearance - alert, well appearing, and in no distress Heart - normal rate and regular rhythm, no murmurs noted L Knee - + Valgus, + crepitus, Flexion 110-120, Extension 0-5, + medial/lateral joint line tenderness.    Lab Results  Component Value Date   HGBA1C 7.2 08/04/2013

## 2013-08-04 NOTE — Assessment & Plan Note (Signed)
Pt with previous History of L knee OA. Has not been able to get her X-rays so recommend getting B/L weight bearing, 30 degree flexion, lateral, sunrise views and see back in 2-3 months. Options include and detailed for her include PT/Medication/Injection/Knee brace and referral for TKA. mobic as worked well for her, will refill this today and continue for now as this has allowed her to exercise.

## 2013-08-04 NOTE — Assessment & Plan Note (Addendum)
Amanda's sugars have been slightly elevated in the morning, up from 120s-130s to 150s-160s over the last month.  She continues to be compliant with her Lantus and Vyctoza daily, and denies any hypoglycemic events including any nausea, dizziness, blurred vision, diplopia, nightmares.  Will see her back in three months and recheck another A1C.  If continues to be elevated I would consider restarting metformin at low dose again as I cannot find a reason she is not on this and she does not state any reason why she can't take it.  Discussed options with her including 1) exercise/diet, 2) Metformin, 3) increasing Lantus to get her fasting sugars under 120, she would like to do the exercise and check in three months.

## 2013-08-04 NOTE — Assessment & Plan Note (Signed)
Kaitlyn Ferguson's repeat blood pressure by manual cuff was 130/60, which is at goal for her on her lisinopril/HCTZ.  She is compliant with this medication and she denies any lightheadedness, dizziness, blurred vision, edema, palpitations.  Could consider adding norvasc in future if elevated, we will get two labs on her today including BMET and CBC.

## 2013-08-04 NOTE — Assessment & Plan Note (Signed)
Continues on Crestor, will check LDL today.

## 2013-08-05 ENCOUNTER — Encounter: Payer: Self-pay | Admitting: Family Medicine

## 2013-08-12 ENCOUNTER — Ambulatory Visit (INDEPENDENT_AMBULATORY_CARE_PROVIDER_SITE_OTHER): Payer: Medicare Other | Admitting: Pharmacist

## 2013-08-12 ENCOUNTER — Encounter: Payer: Self-pay | Admitting: Pharmacist

## 2013-08-12 VITALS — BP 174/77 | HR 66 | Wt 230.0 lb

## 2013-08-12 DIAGNOSIS — I10 Essential (primary) hypertension: Secondary | ICD-10-CM

## 2013-08-12 DIAGNOSIS — E119 Type 2 diabetes mellitus without complications: Secondary | ICD-10-CM

## 2013-08-12 MED ORDER — AMLODIPINE BESYLATE 5 MG PO TABS: 5.0000 mg | ORAL_TABLET | Freq: Every day | ORAL | Status: DC

## 2013-08-12 NOTE — Patient Instructions (Addendum)
Check blood sugars first thing in AM  AND  2 hours after meals on a rotating schedule  Increase Lantus to 25 units in the AM  Start amlodipine (Norvasc)  5mg  once daily in the PM at the same time as Crestor and Aspirin  Next visit in January in Rx Clinic

## 2013-08-12 NOTE — Progress Notes (Signed)
S:    Patient arrives in good spirits.    Presents for diabetes follow up noting worsening control recently.   She is mildly frustrated with her blood glucose and Blood Pressure readings.  Patient reports adherence with all prescribed therapy.   O:  . Lab Results  Component Value Date   HGBA1C 7.2 08/04/2013     home fasting CBG readings of 140-160 2 hour post-prandial/random CBG readings of 140 - 220.  A/P: Diabetes longstanding with modestly worsening control likely related to dietary indiscretion and diminished activity related to seasonal weather changes.   Denies hypoglycemic events and is able to verbalize appropriate hypoglycemia management plan.  Reports adherence with medication.  Increased dose of basal insulin Lantus (insulin glargine) from 20 units to 25 units each AM. Patient is NOT interested in restarting NOVOLOG or any prandial insulin as she had a hypoglycemic episode in the past due to taking an overdose of Novolog.   She is willing to test post-prandials to assess for the need to use prandial insulin.  May consider Basal plus 1 strategy at next visit.   Blood pressure remains elevated today.   She is willing to reinitiate amlodipine 5mg  once daily in the PM  which she has needed and tolerated in teh past.  Reevaluate BP at next visit.   Written patient instructions provided.  Follow up in Pharmacist Clinic Visit in January.   Total time in face to face counseling 25 minutes.

## 2013-08-12 NOTE — Progress Notes (Signed)
Patient ID: Kaitlyn Ferguson, female   DOB: 01/27/1946, 67 y.o.   MRN: [id number] Reviewed: Agree with Dr. Koval's documentation and management. 

## 2013-08-12 NOTE — Assessment & Plan Note (Signed)
Diabetes longstanding with modestly worsening control likely related to dietary indiscretion and diminished activity related to seasonal weather changes.   Denies hypoglycemic events and is able to verbalize appropriate hypoglycemia management plan.  Reports adherence with medication.  Increased dose of basal insulin Lantus (insulin glargine) from 20 units to 25 units each AM. Patient is NOT interested in restarting NOVOLOG or any prandial insulin as she had a hypoglycemic episode in the past due to taking an overdose of Novolog.   She is willing to test post-prandials to assess for the need to use prandial insulin.  May consider Basal plus 1 strategy at next visit.   Blood pressure remains elevated today.   She is willing to reinitiate amlodipine 5mg  once daily in the PM  which she has needed and tolerated in teh past.  Reevaluate BP at next visit.

## 2013-08-12 NOTE — Assessment & Plan Note (Signed)
Blood pressure remains elevated today.   She is willing to reinitiate amlodipine 5mg  once daily in the PM  which she has needed and tolerated in the past.  Reevaluate BP at next visit.

## 2013-09-07 ENCOUNTER — Other Ambulatory Visit: Payer: Self-pay | Admitting: Family Medicine

## 2013-09-10 ENCOUNTER — Ambulatory Visit: Payer: Medicare Other | Admitting: Pharmacist

## 2013-09-16 ENCOUNTER — Encounter: Payer: Self-pay | Admitting: Pharmacist

## 2013-09-16 ENCOUNTER — Other Ambulatory Visit: Payer: Self-pay | Admitting: Ophthalmology

## 2013-09-16 ENCOUNTER — Ambulatory Visit
Admission: RE | Admit: 2013-09-16 | Discharge: 2013-09-16 | Disposition: A | Discharge status: Home / Self Care | Payer: Medicare HMO | Source: Ambulatory Visit | Attending: Ophthalmology | Admitting: Ophthalmology

## 2013-09-16 ENCOUNTER — Ambulatory Visit (INDEPENDENT_AMBULATORY_CARE_PROVIDER_SITE_OTHER): Payer: Medicare HMO | Admitting: Pharmacist

## 2013-09-16 VITALS — BP 163/88 | HR 75 | Ht 67.0 in | Wt 232.0 lb

## 2013-09-16 DIAGNOSIS — Z01811 Encounter for preprocedural respiratory examination: Secondary | ICD-10-CM

## 2013-09-16 DIAGNOSIS — E119 Type 2 diabetes mellitus without complications: Secondary | ICD-10-CM

## 2013-09-16 DIAGNOSIS — I10 Essential (primary) hypertension: Secondary | ICD-10-CM

## 2013-09-16 MED ORDER — INSULIN PEN NEEDLE 31G X 5 MM MISC: Status: DC

## 2013-09-16 NOTE — Patient Instructions (Addendum)
Thanks for coming to see Kaitlyn Ferguson today!  We will increase your lisinopril/HCTz.  Please take your lisinopril/HCTz 20/12.5mg  in addition to the lisinopril 10/12.5 mg at home.  Please keep up the great work on going to [employer] and working out with your friend!  Remember what we discuss about your meal portion control.  Remember to continue to check your blood sugars regularly.  Please come back to see Kaitlyn Ferguson in Pharmacy Clinic in about a month.

## 2013-09-16 NOTE — Assessment & Plan Note (Signed)
Diabetes currently under very good control despite small amount of weight gain over the holidays.    Denies hypoglycemic events and is able to verbalize appropriate hypoglycemia management plan.  Reports adherence with medication.  Patient is meeting goals for blood glucose control at this time. No change required.  Patient will work hard to lose 2 pounds in the next month.

## 2013-09-16 NOTE — Progress Notes (Signed)
S:    Patient arrives in great spirits.    Presents for diabetes and blood pressure follow up.   She reports plans to have eye deviation correction surgery in early February.  She admits dietary indiscretion however reports her activity level has been busy.   O:  . Lab Results  Component Value Date   HGBA1C 7.2 08/04/2013     home fasting CBG readings of 100-low 200s 2 hour post-prandial/random CBG readings of < 200 consistently   A/P: Diabetes currently under very good control despite small amount of weight gain over the holidays.    Denies hypoglycemic events and is able to verbalize appropriate hypoglycemia management plan.  Reports adherence with medication.  Patient is meeting goals for blood glucose control at this time. No change required.  Patient will work hard to lose 2 pounds in the next month.   Blood pressure remains elevated greater than goal in patient who reports adherence to BP medications.   She understands the need to increase therapy and would be willing to increase her lisinopril HCTZ dose by taking a supply of her 10/12.5 lisinopril HCTZ in combination with her 20/12.5mg  supply.   She was encourage to hear that if she is able to lose weight back to her 220lb level she may be able to cut back on medication again.   Written patient instructions provided.  Follow up in Pharmacist Clinic Visit in 1 month.    Total time in face to face counseling 23minutes.  Patient seen with Jeronimo Norma, PhamD - Resident.

## 2013-09-16 NOTE — Assessment & Plan Note (Signed)
Blood pressure remains elevated greater than goal in patient who reports adherence to BP medications.   She understands the need to increase therapy and would be willing to increase her lisinopril HCTZ dose by taking a supply of her 10/12.5 lisinopril HCTZ in combination with her 20/12.5mg  supply.   She was encourage to hear that if she is able to lose weight back to her 220lb level she may be able to cut back on medication again.   Written patient instructions provided.  Follow up in Pharmacist Clinic Visit in 1 month.    Total time in face to face counseling 61minutes.  Patient seen with Jeronimo Norma, PhamD - Resident.

## 2013-09-17 NOTE — Progress Notes (Signed)
Patient ID: Kaitlyn Ferguson, female   DOB: 02/03/1946, 67 y.o.   MRN: [id number] Reviewed: Agree with Dr. Koval's documentation and management. 

## 2013-09-28 ENCOUNTER — Other Ambulatory Visit: Payer: Self-pay | Admitting: Family Medicine

## 2013-10-07 ENCOUNTER — Telehealth: Payer: Self-pay | Admitting: *Deleted

## 2013-10-07 NOTE — Telephone Encounter (Signed)
Julien Nordmann from [place] called requesting pt's most recent CMP/BMP be faxed to [phone].  Derl Barrow, RN

## 2013-10-07 NOTE — Telephone Encounter (Signed)
Printed and ready to be faxed back per nursing.  Thanks, Tamela Oddi. Awanda Mink, DO of [facility] 10/07/2013, 4:51 PM

## 2013-10-21 ENCOUNTER — Ambulatory Visit: Payer: Medicare HMO | Admitting: Pharmacist

## 2013-10-26 ENCOUNTER — Ambulatory Visit (INDEPENDENT_AMBULATORY_CARE_PROVIDER_SITE_OTHER): Payer: Medicare HMO | Admitting: Pharmacist

## 2013-10-26 ENCOUNTER — Encounter: Payer: Self-pay | Admitting: Pharmacist

## 2013-10-26 VITALS — BP 159/69 | HR 72 | Wt 231.2 lb

## 2013-10-26 DIAGNOSIS — I10 Essential (primary) hypertension: Secondary | ICD-10-CM

## 2013-10-26 DIAGNOSIS — E119 Type 2 diabetes mellitus without complications: Secondary | ICD-10-CM

## 2013-10-26 MED ORDER — LISINOPRIL-HYDROCHLOROTHIAZIDE 20-12.5 MG PO TABS: 2.0000 | ORAL_TABLET | Freq: Every day | ORAL | Status: DC

## 2013-10-26 MED ORDER — INSULIN GLARGINE 100 UNIT/ML SOLOSTAR PEN: 30.0000 [IU] | PEN_INJECTOR | SUBCUTANEOUS | Status: DC

## 2013-10-26 MED ORDER — AMLODIPINE BESYLATE 10 MG PO TABS
10.0000 mg | ORAL_TABLET | Freq: Every evening | ORAL | Status: DC
Start: 2013-10-26 — End: 2014-10-18

## 2013-10-26 NOTE — Patient Instructions (Signed)
Time to get back to working out after your surgery!... Good luck with your weight loss goal of 5 pounds by the [facility].    Stop Lisinopril 10/12.5mg  tablets  Increase Lisinopril 20/12.5mg  tablets to TWO once daily in the morning.   Increase Amlodipine to 10mg  every evening.   Please take TWO of the 5mg  tablets until gone THEN start the 10mg  tablets ONE daily in the evening.    Increase your Lantus to 30 units each morning.  Return to Pharmacy Clinic in April.

## 2013-10-26 NOTE — Assessment & Plan Note (Signed)
Diabetes currently worsened control due to dietary indiscretion and physical immobility post eye surgery.   Denies hypoglycemic events and is able to verbalize appropriate hypoglycemia management plan.  Reports adherence with medication.  DM control is suboptimal likely due to recent eye surgery and lack of exercise. Increase Lantus to 30 units qAM.  Instructed patient to continue to exercise.  Written patient instructions provided.  Follow up in Pharmacist Clinic Visit in April.   Total time in face to face counseling 30 minutes.  Patient seen with Epimenio Sarin, PharmD Candidate.

## 2013-10-26 NOTE — Progress Notes (Signed)
S:    Patient arrives to Pharmacy Clinic today for DM follow up appointment. She recently had eye surgery to fix wandering eye after which she did not exercise for ~2 weeks. Pt started back exercising (aerobics and Zumba at [facility]) yesterday (03/02). Pt received eye ointment after surgery to apply to eye daily but cannot remember the name of the medication. She is planning to go to the [place] in May and wants to lose ~5 pounds by then.   O:   BP: 159/69 today  BGs: ~150s-low 200s today, with only a few values in 200s  . Lab Results  Component Value Date   HGBA1C 7.2 08/04/2013     A/P: Diabetes currently worsened control due to dietary indiscretion and physical immobility post eye surgery.   Denies hypoglycemic events and is able to verbalize appropriate hypoglycemia management plan.  Reports adherence with medication.   DM control is suboptimal likely due to recent eye surgery and lack of exercise. Increase Lantus to 30 units qAM.   BP control is suboptimal also likely due to lack of exercise. D/c lisinopril/HCTZ 10/12.5 mg tablets. Increase lisinopril/HCTZ 20/12.5 mg tables to two tablets once daily (40/25 mg daily). Increase amlodipine to 10 mg daily in the evening.   Instructed patient to continue to exercise.   Written patient instructions provided.  Follow up in Pharmacist Clinic Visit in April.   Total time in face to face counseling 30 minutes.  Patient seen with Epimenio Sarin, PharmD Candidate.

## 2013-10-26 NOTE — Progress Notes (Signed)
   Subjective:    Patient ID: Kaitlyn Ferguson, female    DOB: Jun 11, 1946, 68 y.o.   MRN: [id number]  HPI Reviewed: Agree with Dr. Graylin Shiver documentation and management.     Review of Systems     Objective:   Physical Exam        Assessment & Plan:

## 2013-10-26 NOTE — Assessment & Plan Note (Signed)
BP control is suboptimal also likely due to lack of exercise. D/c lisinopril/HCTZ 10/12.5 mg tablets. Increase lisinopril/HCTZ 20/12.5 mg tables to two tablets once daily (40/25 mg daily). Increase amlodipine to 10 mg daily in the evening.

## 2013-11-01 ENCOUNTER — Ambulatory Visit: Payer: Medicare HMO | Admitting: Family Medicine

## 2013-11-16 ENCOUNTER — Encounter: Payer: Self-pay | Admitting: Family Medicine

## 2013-11-16 ENCOUNTER — Ambulatory Visit (INDEPENDENT_AMBULATORY_CARE_PROVIDER_SITE_OTHER): Payer: Medicare HMO | Admitting: Family Medicine

## 2013-11-16 VITALS — BP 143/61 | HR 66 | Temp 98.8°F | Ht 67.0 in | Wt 233.0 lb

## 2013-11-16 DIAGNOSIS — E119 Type 2 diabetes mellitus without complications: Secondary | ICD-10-CM

## 2013-11-16 DIAGNOSIS — M159 Polyosteoarthritis, unspecified: Secondary | ICD-10-CM

## 2013-11-16 DIAGNOSIS — I10 Essential (primary) hypertension: Secondary | ICD-10-CM

## 2013-11-16 LAB — POCT GLYCOSYLATED HEMOGLOBIN (HGB A1C): Hemoglobin A1C: 7.4

## 2013-11-16 NOTE — Progress Notes (Signed)
Kaitlyn Ferguson is a 68 y.o. female who presents today for DM, HTN, and HLD, and L Knee OA   L Knee OA - Pt has been unable to get her x-rays done of her knee, however, she has been able to continue to exercise when taking mobic daily.  Denies any GI upset with the medication.  DM II - Fasting sugars have been elevated (150-210) in the AM.  She continues to be compliant with her Lantus and Vyctoza daily, and denies any hypoglycemic events including any nausea, dizziness, blurred vision, diplopia, nightmares.  HTN - At goal, now on HCTZ/Lisinopril 20/12.5 mg w/o SE.  As well on Norvasc 10 mg qhs w/o SE.  She is compliant with this medication and she denies any lightheadedness, dizziness, blurred vision, edema, palpitations.   Hyperlipidemia - Compliant with crestor, denies myalgias.   Past Medical History  Diagnosis Date  . Arthritis   . Diabetes mellitus   . Cholelithiasis   . Hyperlipidemia   . Hypothyroidism   . Obesity   . DJD (degenerative joint disease) 10/07/2008  . Osteomyelitis of ankle     Complications of Right ankle injury  . DJD (degenerative joint disease)   . Hypertension   . Neuropathy     History  Smoking status  . Never Smoker   Smokeless tobacco  . Never Used    Comment: No tobacco currently    Family History  Problem Relation Age of Onset  . Diabetes Brother   . Diabetes Sister     DM2-lost weight-now okay  . Heart attack Father     First MI- early 77's  . Stroke Mother   . Heart failure Mother   . Multiple myeloma Mother     Deceased at 32 due to same    Current Outpatient Prescriptions on File Prior to Visit  Medication Sig Dispense Refill  . amLODipine (NORVASC) 10 MG tablet Take 1 tablet (10 mg total) by mouth every evening.  90 tablet  3  . aspirin 81 MG tablet Take 81 mg by mouth at bedtime.       . B-D ULTRA-FINE 33 LANCETS MISC Use as directed  100 each  11  . Insulin Glargine (LANTUS SOLOSTAR) 100 UNIT/ML Solostar Pen Inject 30 Units  into the skin every morning. Dispense 3 month supply  10 mL  prn  . Insulin Pen Needle (B-D UF III MINI PEN NEEDLES) 31G X 5 MM MISC Dispense qt sufficient for two shots daily  100 each  11  . levothyroxine (SYNTHROID, LEVOTHROID) 112 MCG tablet take 1 tablet by mouth once daily  90 tablet  3  . Liraglutide (VICTOZA) 18 MG/3ML SOLN injection Inject 1.8 mg into the skin daily.       Marland Kitchen lisinopril-hydrochlorothiazide (ZESTORETIC) 20-12.5 MG per tablet Take 2 tablets by mouth daily.  180 tablet  3  . meloxicam (MOBIC) 15 MG tablet Take 1 tablet (15 mg total) by mouth daily.  90 tablet  2  . omeprazole (PRILOSEC) 40 MG capsule Take 1 capsule (40 mg total) by mouth daily.  30 capsule  5  . rosuvastatin (CRESTOR) 20 MG tablet take 1 tablet by mouth once daily  30 tablet  5  . [DISCONTINUED] Insulin Syringe-Needle U-100 31G X 5/16" 1 ML MISC Use with insulin as directed.  100 each  11   No current facility-administered medications on file prior to visit.    ROS: Per HPI.  All other systems reviewed and are negative.  Physical Exam Filed Vitals:   11/16/13 1556  BP: 143/61  Pulse: 66  Temp: 98.8 F (37.1 C)    Physical Examination: General appearance - alert, well appearing, and in no distress Heart - normal rate and regular rhythm, no murmurs noted L Knee - + Valgus, + crepitus, Flexion 110-120, Extension 0-5, + medial/lateral joint line tenderness.    Lab Results  Component Value Date   HGBA1C 7.2 08/04/2013

## 2013-11-16 NOTE — Patient Instructions (Signed)
Seeing you today Kaitlyn Ferguson.  Please increase your Lantus by 1 to 2 U in the AM until your fasting sugars are less than 130 for 2 consecutive days.  If you start to have hypoglycemic symptoms or your sugars are less than 70 for 2 consecutive mornings, please decrease your Lantus by 2 U in the morning until you are no longer having Sx.  Thanks, Dr. Awanda Mink

## 2013-11-16 NOTE — Assessment & Plan Note (Addendum)
Continues on Lantus 30 U Am/Victoza, poor control recently secondary to cataract surgery and increased mobility.  Denies any hypoglycemic episodes.  Increase Lantus 1-2 U qam until fasting sugars 100-130 x 2 days.  Decrease by 2 U in the AM if <70 w/ Sx in the AM.  To f/u w/ pharmacy clinic in April.

## 2013-11-16 NOTE — Assessment & Plan Note (Addendum)
Pt with previous History of L knee OA. Has not been able to get her X-rays so recommend getting B/L weight bearing, 30 degree flexion, lateral, sunrise views and see back in 2-3 months. Options include and detailed for her include PT/Medication/Injection/Knee brace and referral for TKA. mobic as worked well for her, continue for now as this has allowed her to exercise.

## 2013-11-16 NOTE — Assessment & Plan Note (Signed)
BP .143/61 on initial check today, compliant with medication.  Continue with Zestoretic 20/12.5 mg BID and norvasc 10 mg qhs.  F/U in 3-6 months, consider repeat BMET at that time

## 2013-11-29 ENCOUNTER — Other Ambulatory Visit: Payer: Self-pay | Admitting: *Deleted

## 2013-11-29 MED ORDER — ROSUVASTATIN CALCIUM 20 MG PO TABS
ORAL_TABLET | ORAL | Status: DC
Start: 2013-11-29 — End: 2013-12-15

## 2013-12-14 ENCOUNTER — Telehealth: Payer: Self-pay | Admitting: Family Medicine

## 2013-12-14 NOTE — Telephone Encounter (Signed)
Prior Authorization for Victoza 3-Pak received from [employer].  Prior authorization form placed in provider box for review.  Derl Barrow, RN

## 2013-12-14 NOTE — Telephone Encounter (Signed)
Patient states that the RX for VICTOZA will need a prior auth before the pharmacy will fill it. Please advise.

## 2013-12-15 ENCOUNTER — Other Ambulatory Visit: Payer: Self-pay | Admitting: *Deleted

## 2013-12-15 MED ORDER — ROSUVASTATIN CALCIUM 20 MG PO TABS: ORAL_TABLET | ORAL | Status: DC

## 2013-12-15 NOTE — Telephone Encounter (Signed)
Prior Authorization for Victoza has been approved until 12/16/2014.  [facility] pharmacy informed of the approval. Derl Barrow, RN

## 2013-12-15 NOTE — Telephone Encounter (Signed)
Prior Authorization for Victoza  Faxed to [facility] for review. Derl Barrow, RN

## 2013-12-15 NOTE — Telephone Encounter (Signed)
Completed.  Thanks, Tamela Oddi. Awanda Mink, DO of [facility] 12/15/2013, 9:02 AM

## 2014-01-05 ENCOUNTER — Telehealth: Payer: Self-pay | Admitting: Family Medicine

## 2014-01-05 NOTE — Telephone Encounter (Signed)
Needs refill on prilosec. Would like for it to be a 90 day supply Please advise

## 2014-01-06 ENCOUNTER — Other Ambulatory Visit: Payer: Self-pay | Admitting: *Deleted

## 2014-01-06 MED ORDER — OMEPRAZOLE 40 MG PO CPDR: 40.0000 mg | DELAYED_RELEASE_CAPSULE | Freq: Every day | ORAL | Status: DC

## 2014-01-06 NOTE — Telephone Encounter (Signed)
3rd request.  Derl Barrow, RN

## 2014-02-14 ENCOUNTER — Ambulatory Visit (INDEPENDENT_AMBULATORY_CARE_PROVIDER_SITE_OTHER): Payer: Medicare HMO | Admitting: Family Medicine

## 2014-02-14 ENCOUNTER — Encounter: Payer: Self-pay | Admitting: Family Medicine

## 2014-02-14 VITALS — BP 141/62 | HR 78 | Temp 99.7°F | Ht 67.0 in | Wt 190.0 lb

## 2014-02-14 DIAGNOSIS — I10 Essential (primary) hypertension: Secondary | ICD-10-CM

## 2014-02-14 DIAGNOSIS — M76899 Other specified enthesopathies of unspecified lower limb, excluding foot: Secondary | ICD-10-CM

## 2014-02-14 DIAGNOSIS — E119 Type 2 diabetes mellitus without complications: Secondary | ICD-10-CM

## 2014-02-14 DIAGNOSIS — M7061 Trochanteric bursitis, right hip: Secondary | ICD-10-CM | POA: Insufficient documentation

## 2014-02-14 DIAGNOSIS — M159 Polyosteoarthritis, unspecified: Secondary | ICD-10-CM

## 2014-02-14 LAB — POCT GLYCOSYLATED HEMOGLOBIN (HGB A1C): Hemoglobin A1C: 7.4

## 2014-02-14 NOTE — Assessment & Plan Note (Signed)
Does not want injection today, recommend continue mobic, exercises given.  If no improvement in 1-2 months, do recommend injection.  No red flags today including paresthesias/weakness/weight loss.

## 2014-02-14 NOTE — Assessment & Plan Note (Signed)
Pt with previous History of L knee OA. Has not gotten X-rays so recommend getting B/L weight bearing, 30 degree flexion, lateral, sunrise views if starts to have unacceptable pain. Options include and detailed for her include PT/Medication/Injection/Hyuloronic Acid/Knee brace and referral for TKA. Mobic as worked well for her, continue for now as this has allowed her to exercise.

## 2014-02-14 NOTE — Assessment & Plan Note (Signed)
Continues on Lantus 45 U Am/Victoza, good fasting AM control around 130 usually.  Denies any hypoglycemic episodes.  To f/u w/ pharmacy clinic in the next 2-3 weeks, emphasized importance of f/u.

## 2014-02-14 NOTE — Patient Instructions (Signed)
Please follow up with Dr. Valentina Lucks in 1-2 weeks.  We will see you back in 3 months.  Thanks, Dr. Awanda Mink

## 2014-02-14 NOTE — Assessment & Plan Note (Signed)
BP 141/62 on initial check today, compliant with medication.  Continue with Zestoretic 20/12.5 mg BID and norvasc 10 mg qhs.  Will get BMET today and if normal renal function, consider f/u BMET in 6-12 months.

## 2014-02-14 NOTE — Progress Notes (Signed)
Kaitlyn Ferguson is a 68 y.o. female who presents today for DM, HTN, and HLD, and L Knee OA   L Knee OA - Pt has not obtained her x-rays done of her knee, however, she has been able to continue to exercise when taking mobic daily.  Denies any GI upset with the medication.  DM II - Fasting sugars have been well controlled (130-150) in the AM.  She continues to be compliant with her Lantus (45 U currently) and Vyctoza (1.8) daily, and denies any hypoglycemic events including any nausea, dizziness, blurred vision, diplopia, nightmares.  HTN - At goal, on HCTZ/Lisinopril 20/12.5 mg w/o SE.  As well on Norvasc 10 mg qhs w/o SE.  She is compliant with this medication and she denies any lightheadedness, dizziness, blurred vision, edema, palpitations.   Hyperlipidemia - Compliant with crestor, denies myalgias.   R hip pain - Began about 2-3 weeks ago, no injury to the area, never had before, located on lateral hip region, worse when sleeping on it or standing for long time.  Denies weakness/paresthesias/dyesthesias down her R leg, no low back pain.  Mobic helps sometimes with this but rest seems to improve the most.    Past Medical History  Diagnosis Date  . Arthritis   . Diabetes mellitus   . Cholelithiasis   . Hyperlipidemia   . Hypothyroidism   . Obesity   . DJD (degenerative joint disease) 10/07/2008  . Osteomyelitis of ankle     Complications of Right ankle injury  . DJD (degenerative joint disease)   . Hypertension   . Neuropathy     History  Smoking status  . Never Smoker   Smokeless tobacco  . Never Used    Comment: No tobacco currently    Family History  Problem Relation Age of Onset  . Diabetes Brother   . Diabetes Sister     DM2-lost weight-now okay  . Heart attack Father     First MI- early 70's  . Stroke Mother   . Heart failure Mother   . Multiple myeloma Mother     Deceased at 58 due to same    Current Outpatient Prescriptions on File Prior to Visit   Medication Sig Dispense Refill  . amLODipine (NORVASC) 10 MG tablet Take 1 tablet (10 mg total) by mouth every evening.  90 tablet  3  . aspirin 81 MG tablet Take 81 mg by mouth at bedtime.       . B-D ULTRA-FINE 33 LANCETS MISC Use as directed  100 each  11  . Insulin Glargine (LANTUS SOLOSTAR) 100 UNIT/ML Solostar Pen Inject 30 Units into the skin every morning. Dispense 3 month supply  10 mL  prn  . Insulin Pen Needle (B-D UF III MINI PEN NEEDLES) 31G X 5 MM MISC Dispense qt sufficient for two shots daily  100 each  11  . levothyroxine (SYNTHROID, LEVOTHROID) 112 MCG tablet take 1 tablet by mouth once daily  90 tablet  3  . Liraglutide (VICTOZA) 18 MG/3ML SOLN injection Inject 1.8 mg into the skin daily.       Marland Kitchen lisinopril-hydrochlorothiazide (ZESTORETIC) 20-12.5 MG per tablet Take 2 tablets by mouth daily.  180 tablet  3  . meloxicam (MOBIC) 15 MG tablet Take 1 tablet (15 mg total) by mouth daily.  90 tablet  2  . omeprazole (PRILOSEC) 40 MG capsule Take 1 capsule (40 mg total) by mouth daily.  90 capsule  3  . rosuvastatin (CRESTOR) 20  MG tablet take 1 tablet by mouth once daily  30 tablet  11  . [DISCONTINUED] Insulin Syringe-Needle U-100 31G X 5/16" 1 ML MISC Use with insulin as directed.  100 each  11   No current facility-administered medications on file prior to visit.    ROS: Per HPI.  All other systems reviewed and are negative.   Physical Exam Filed Vitals:   02/14/14 1506  BP: 141/62  Pulse: 78  Temp: 99.7 F (37.6 C)    Physical Examination: General appearance - alert, well appearing, and in no distress Heart - normal rate and regular rhythm, no murmurs noted L Knee - + Valgus, + crepitus, Flexion 110-120, Extension 0-5, + medial/lateral joint line tenderness.  Hip: ROM IR: 80 Deg, ER: 80 Deg, Flexion: 120 Deg, Extension: 100 Deg, Abduction: 45 Deg, Adduction: 45 Deg Strength IR: 5/5, ER: 5/5, Flexion: 5/5, Extension: 5/5, Abduction: 5/5, Adduction: 5/5 Pelvic  alignment unremarkable to inspection and palpation. Standing hip rotation and gait without trendelenburg / unsteadiness. + TTP at greater trochanter  No tenderness over piriformis No SI joint tenderness and normal minimal SI movement. Negative Ober test, negative seated flexion    Lab Results  Component Value Date   HGBA1C 7.4 02/14/2014

## 2014-03-08 ENCOUNTER — Ambulatory Visit (INDEPENDENT_AMBULATORY_CARE_PROVIDER_SITE_OTHER): Payer: Commercial Managed Care - HMO | Admitting: Pharmacist

## 2014-03-08 ENCOUNTER — Encounter: Payer: Self-pay | Admitting: Pharmacist

## 2014-03-08 VITALS — Ht 67.75 in | Wt 246.0 lb

## 2014-03-08 DIAGNOSIS — E119 Type 2 diabetes mellitus without complications: Secondary | ICD-10-CM

## 2014-03-08 DIAGNOSIS — E039 Hypothyroidism, unspecified: Secondary | ICD-10-CM

## 2014-03-08 LAB — TSH: TSH: 0.932 u[IU]/mL (ref 0.350–4.500)

## 2014-03-08 NOTE — Assessment & Plan Note (Addendum)
Concerned for symptomatic hypothyroidism given increased lethargy, increased appetite and dry hair.  Check TSH today.   TSH - normal - communicated via phone. Patient verbalized understanding that lab was normal.

## 2014-03-08 NOTE — Assessment & Plan Note (Signed)
Diabetes of longstanding duration under fair control but with worsening control over the last month.  Denies hypoglycemic events and is able to verbalize appropriate hypoglycemia management plan.  Reports adherence with medication.  She has self titrated her lantus from 30 to 45 units due to elevated readings.  Control is suboptimal due to increased appetite. Continued basal insulin Lantus (insulin glargine) at 45 units daily.   Continued Victoza (liraglutide) at same dose.   Patient is reluctant to try prandial insulin as she has a hypoglycemic.   Concerned for symptomatic hypothyroidism given increased lethargy, increased appetite and dry hair.  Check TSH today.   Written patient instructions provided.  Follow up in Pharmacist Clinic Visit following next visit with Dr. Awanda Mink in 1-2 weeks.   Patient seen with Nicoletta Ba, PharmD Resident.

## 2014-03-08 NOTE — Progress Notes (Signed)
Patient ID: Kaitlyn Ferguson, female   DOB: 25-Nov-1945, 68 y.o.   MRN: [id number] Reviewed: Agree with Dr. Graylin Shiver documentation and management.

## 2014-03-08 NOTE — Patient Instructions (Signed)
Continue with current exercise and diet plan.    Please know that your lab work will help [place] to decide how your thyroid is working.   Follow up with Dr. Awanda Mink in 1-2 weeks.   Thanks for coming in today.

## 2014-03-08 NOTE — Progress Notes (Signed)
S:    Patient arrives stating she has been eating more and is worried about her weight gain. Presents for diabetes follow up.   Patient has taken all of her medication without any significant changes since last visit.  She reports she feels like she has been gaining weight and is unsure why.  She reports feeling tired and states her hair is dry.    O:   Lab Results  Component Value Date   HGBA1C 7.4 02/14/2014     home fasting CBG readings of 150-200 2 hour post-prandial/random CBG readings of 150-225.  A/P: Diabetes of longstanding duration under fair control but with worsening control over the last month.  Denies hypoglycemic events and is able to verbalize appropriate hypoglycemia management plan.  Reports adherence with medication.  She has self titrated her lantus from 30 to 45 units due to elevated readings.  Control is suboptimal due to increased appetite. Continued basal insulin Lantus (insulin glargine) at 45 units daily.   Continued Victoza (liraglutide) at same dose.   Patient is reluctant to try prandial insulin as she has a hypoglycemic.   Concerned for symptomatic hypothyroidism given increased lethargy, increased appetite and dry hair.  Check TSH today.   Written patient instructions provided.  Follow up in Pharmacist Clinic Visit following next visit with Dr. Awanda Mink in 1-2 weeks.   Patient seen with Nicoletta Ba, PharmD Resident.

## 2014-03-28 ENCOUNTER — Ambulatory Visit: Payer: Commercial Managed Care - HMO | Admitting: Family Medicine

## 2014-04-22 ENCOUNTER — Other Ambulatory Visit: Payer: Self-pay

## 2014-04-22 DIAGNOSIS — Z1231 Encounter for screening mammogram for malignant neoplasm of breast: Secondary | ICD-10-CM

## 2014-04-29 ENCOUNTER — Ambulatory Visit (INDEPENDENT_AMBULATORY_CARE_PROVIDER_SITE_OTHER): Payer: Commercial Managed Care - HMO | Admitting: Pharmacist

## 2014-04-29 ENCOUNTER — Encounter: Payer: Self-pay | Admitting: Pharmacist

## 2014-04-29 VITALS — BP 119/74 | HR 87 | Ht 67.0 in | Wt 244.9 lb

## 2014-04-29 DIAGNOSIS — E119 Type 2 diabetes mellitus without complications: Secondary | ICD-10-CM

## 2014-04-29 NOTE — Assessment & Plan Note (Addendum)
Longstanding Diabetes remains uncontrolled despite tx with Lantus and Victoza.  Denies hypoglycemic events and is able to verbalize appropriate hypoglycemia management plan.  Reports adherence with medication. Control is suboptimal due to need for titration of Lantus. She is already taking maximum dose of Victoza and tolerating it well. Patient suggested to [place] that she wants to increase her Lantus from 45 units to 55 units daily. Given her average fasting readings >180, we agree with this plan. Informed patient to call us if she reports consistent BG readings < 90 or signs/symptoms of hypoglycemia. Patient agrees with and understands this plan. We discussed continually exercising and going to the gym, as well as portion control. Next A1C anticipated in October.  Could patient retry metformin in the future.  Written patient instructions provided.  Follow up in Pharmacist Clinic Visit in [place] October.   Total time in face to face counseling 30 minutes.  Patient seen with Gloriajean Dell, PharmD Candidate;  Fuller Canada,  PharmD Resident, and Nicoletta Ba, PharmD resident.

## 2014-04-29 NOTE — Patient Instructions (Addendum)
Increase lantus to 55 units once daily  If readings are consistently less than 90 please call the clinic Continue to monitor blood sugar and bring reading to clinics Continue going to the gym and walking Follow-up in 1 month, will recheck A1c at that time

## 2014-04-29 NOTE — Progress Notes (Signed)
S:    Patient arrives ambulating, cheerful, and in no apparent distress after working out this morning. Presents for diabetes follow-up. Patient reports having long-standing history of diabetes. She brings her home glucose log book with her today, with numbers ranging from 150s-230s. She would like to see her fasting readings lower, and states that she is willing at this time to increase her Lantus dose. She checks her BG at home an average of 2 to 3 times a day. Patient reports no signs or symptoms of hypoglycemia at this time. She admits to eating larger portions than she should.   O:  . Lab Results  Component Value Date   HGBA1C 7.4 02/14/2014     A/P: Longstanding Diabetes remains uncontrolled despite tx with Lantus and Victoza.  Denies hypoglycemic events and is able to verbalize appropriate hypoglycemia management plan.  Reports adherence with medication. Control is suboptimal due to need for titration of Lantus. She is already taking maximum dose of Victoza and tolerating it well. Patient suggested to [place] that she wants to increase her Lantus from 45 units to 55 units daily. Given her average fasting readings >180, we agree with this plan. Informed patient to call us if she reports consistent BG readings < 90 or signs/symptoms of hypoglycemia. Patient agrees with and understands this plan. We discussed continually exercising and going to the gym, as well as portion control. Next A1C anticipated in October.  Written patient instructions provided.  Follow up in Pharmacist Clinic Visit in [place] October.   Total time in face to face counseling 30 minutes.  Patient seen with Gloriajean Dell, PharmD Candidate;  Fuller Canada,  PharmD Resident, and Nicoletta Ba, PharmD resident.

## 2014-05-03 NOTE — Progress Notes (Signed)
Patient ID: Kaitlyn Ferguson, female   DOB: [birth year], 68 y.o.   MRN: [id number] Reviewed: Agree with Dr. Graylin Shiver documentation and management.

## 2014-05-12 ENCOUNTER — Ambulatory Visit
Admission: RE | Admit: 2014-05-12 | Discharge: 2014-05-12 | Disposition: A | Discharge status: Home / Self Care | Payer: Commercial Managed Care - HMO | Source: Ambulatory Visit

## 2014-05-12 DIAGNOSIS — Z1231 Encounter for screening mammogram for malignant neoplasm of breast: Secondary | ICD-10-CM

## 2014-05-23 ENCOUNTER — Ambulatory Visit: Payer: Commercial Managed Care - HMO | Admitting: Family Medicine

## 2014-06-01 ENCOUNTER — Encounter: Payer: Self-pay | Admitting: Family Medicine

## 2014-06-01 ENCOUNTER — Ambulatory Visit (INDEPENDENT_AMBULATORY_CARE_PROVIDER_SITE_OTHER): Payer: Commercial Managed Care - HMO | Admitting: Family Medicine

## 2014-06-01 VITALS — BP 147/72 | HR 68 | Temp 99.1°F | Wt 251.4 lb

## 2014-06-01 DIAGNOSIS — I10 Essential (primary) hypertension: Secondary | ICD-10-CM

## 2014-06-01 DIAGNOSIS — M7542 Impingement syndrome of left shoulder: Secondary | ICD-10-CM

## 2014-06-01 DIAGNOSIS — E119 Type 2 diabetes mellitus without complications: Secondary | ICD-10-CM

## 2014-06-01 DIAGNOSIS — Z23 Encounter for immunization: Secondary | ICD-10-CM

## 2014-06-01 DIAGNOSIS — M159 Polyosteoarthritis, unspecified: Secondary | ICD-10-CM

## 2014-06-01 LAB — POCT GLYCOSYLATED HEMOGLOBIN (HGB A1C): Hemoglobin A1C: 7.9

## 2014-06-01 MED ORDER — CELECOXIB 200 MG PO CAPS: 200.0000 mg | ORAL_CAPSULE | Freq: Every day | ORAL | Status: DC

## 2014-06-01 NOTE — Patient Instructions (Signed)
Please consider discussing options with our nutritionist.  We will see you back in 3 months and please get the x-rays of your knees.  Thanks, Dr. Awanda Mink

## 2014-06-01 NOTE — Progress Notes (Signed)
Kaitlyn Ferguson is a 68 y.o. female who presents today for DM, HTN, and HLD, and L Knee OA   L Knee OA - Pt has not obtained her x-rays done of her knee, however, she has been able to continue to exercise when taking mobic daily.  Starting to have GI discomfort with mobic, so stopped taking this recently.  This has led to decreased exercise.    DM II - Fasting sugars have been well controlled (130-150) in the AM.  She continues to be compliant with her Lantus (45 U currently) and Vyctoza (1.8) daily, and denies any hypoglycemic events including any nausea, dizziness, blurred vision, diplopia, nightmares.  HTN - At goal, on HCTZ/Lisinopril 20/12.5 mg w/o SE.  As well on Norvasc 10 mg qhs w/o SE.  She is compliant with this medication and she denies any lightheadedness, dizziness, blurred vision, edema, palpitations.   Hyperlipidemia - Compliant with crestor, denies myalgias.   L Shoulder pain - No new injuries, worse with overhead motion, denies frank weakness, paresthesias, instability, or waking up at night.  Has not tried anything for this at this time.   Past Medical History  Diagnosis Date  . Arthritis   . Diabetes mellitus   . Cholelithiasis   . Hyperlipidemia   . Hypothyroidism   . Obesity   . DJD (degenerative joint disease) 10/07/2008  . Osteomyelitis of ankle     Complications of Right ankle injury  . DJD (degenerative joint disease)   . Hypertension   . Neuropathy     History  Smoking status  . Never Smoker   Smokeless tobacco  . Never Used    Comment: No tobacco currently    Family History  Problem Relation Age of Onset  . Diabetes Brother   . Diabetes Sister     DM2-lost weight-now okay  . Heart attack Father     First MI- early 50's  . Stroke Mother   . Heart failure Mother   . Multiple myeloma Mother     Deceased at 73 due to same    Current Outpatient Prescriptions on File Prior to Visit  Medication Sig Dispense Refill  . amLODipine (NORVASC) 10  MG tablet Take 1 tablet (10 mg total) by mouth every evening.  90 tablet  3  . aspirin 81 MG tablet Take 81 mg by mouth at bedtime.       . B-D ULTRA-FINE 33 LANCETS MISC Use as directed  100 each  11  . Insulin Glargine (LANTUS) 100 UNIT/ML Solostar Pen Inject 55 Units into the skin every morning. Dispense 3 month supply      . Insulin Pen Needle (B-D UF III MINI PEN NEEDLES) 31G X 5 MM MISC Dispense qt sufficient for two shots daily  100 each  11  . levothyroxine (SYNTHROID, LEVOTHROID) 112 MCG tablet take 1 tablet by mouth once daily  90 tablet  3  . Liraglutide (VICTOZA) 18 MG/3ML SOLN injection Inject 1.8 mg into the skin daily.       . lisinopril-hydrochlorothiazide (ZESTORETIC) 20-12.5 MG per tablet Take 2 tablets by mouth daily.  180 tablet  3  . meloxicam (MOBIC) 15 MG tablet Take 1 tablet (15 mg total) by mouth daily.  90 tablet  2  . omeprazole (PRILOSEC) 40 MG capsule Take 1 capsule (40 mg total) by mouth daily.  90 capsule  3  . rosuvastatin (CRESTOR) 20 MG tablet take 1 tablet by mouth once daily  30 tablet  11  . [  DISCONTINUED] Insulin Syringe-Needle U-100 31G X 5/16" 1 ML MISC Use with insulin as directed.  100 each  11   No current facility-administered medications on file prior to visit.    ROS: Per HPI.  All other systems reviewed and are negative.   Physical Exam Filed Vitals:   06/01/14 1533  BP: 147/72  Pulse: 68  Temp: 99.1 F (37.3 C)    Physical Examination: General appearance - alert, well appearing, and in no distress Heart - normal rate and regular rhythm, no murmurs noted L Knee - + Valgus, + crepitus, Flexion 110-120, Extension 0-5, + medial/lateral joint line tenderness.  Shoulder: Inspection reveals no abnormalities, atrophy or asymmetry. Palpation is normal with no tenderness over AC joint or bicipital groove. ROM is full in all planes. Rotator cuff strength normal throughout. Positive impingement with Positive Neer and Hawkin's tests, empty  can. Speeds and Yergason's tests normal. No labral pathology noted with negative Obrien's, negative clunk, negative Crank  Normal scapular function observed. No painful arc and no drop arm sign. No apprehension sign    Lab Results  Component Value Date   HGBA1C 7.9 06/01/2014

## 2014-06-01 NOTE — Assessment & Plan Note (Signed)
Continue with Zestoretic 20/12.5 mg BID and norvasc 10 mg qhs.  Consider BMET in 3-6 months.

## 2014-06-01 NOTE — Assessment & Plan Note (Signed)
Pt with previous History of L knee OA. Has not gotten X-rays so recommend getting B/L weight bearing, 30 degree flexion, lateral, sunrise views if starts to have unacceptable pain. Options include and detailed for her include PT/Medication/Injection/Hyuloronic Acid/Knee brace and referral for TKA. Switch to Celebrex 200 mg daily as having GI upset with Mobic.

## 2014-06-01 NOTE — Assessment & Plan Note (Signed)
Does not want injection.  Start Celebrex 200 mg daily since mobic causing GI upset.  RTC exercises given, consider X-ray of shoulder if no improvement at next visit.

## 2014-06-02 ENCOUNTER — Other Ambulatory Visit: Payer: Self-pay | Admitting: Family Medicine

## 2014-06-07 ENCOUNTER — Telehealth: Payer: Self-pay | Admitting: Family Medicine

## 2014-06-07 DIAGNOSIS — M25562 Pain in left knee: Principal | ICD-10-CM

## 2014-06-07 DIAGNOSIS — M25561 Pain in right knee: Principal | ICD-10-CM

## 2014-06-07 DIAGNOSIS — G8929 Other chronic pain: Secondary | ICD-10-CM

## 2014-06-07 NOTE — Telephone Encounter (Signed)
Dr. Awanda Mink ordered xrays for pt on 04/30/13. Pt never got them done and now wants them re-ordered so she can go have them performed. Pls advise.

## 2014-06-07 NOTE — Telephone Encounter (Signed)
She can still get x-rays done, no need to reorder due to it being an active order still

## 2014-06-15 NOTE — Telephone Encounter (Signed)
Pt never contacted. She actually went to radiology and was told that the orders were expired and that they need to be re-ordered. Orders originally placed in 04/30/13.

## 2014-06-15 NOTE — Telephone Encounter (Signed)
New orders placed, she should be able to get these now.  Thanks [employer]. Kaitlyn Mink, DO of [facility] 06/15/2014, 4:45 PM

## 2014-06-16 NOTE — Telephone Encounter (Signed)
LVM for patient to call back to inoform her that xray has been reordered

## 2014-06-17 ENCOUNTER — Ambulatory Visit (HOSPITAL_COMMUNITY)
Admission: RE | Admit: 2014-06-17 | Discharge: 2014-06-17 | Disposition: A | Discharge status: Home / Self Care | Payer: Medicare HMO | Source: Ambulatory Visit | Attending: Family Medicine | Admitting: Family Medicine

## 2014-06-17 DIAGNOSIS — M170 Bilateral primary osteoarthritis of knee: Secondary | ICD-10-CM | POA: Diagnosis not present

## 2014-06-17 DIAGNOSIS — G8929 Other chronic pain: Secondary | ICD-10-CM

## 2014-06-17 DIAGNOSIS — M25562 Pain in left knee: Principal | ICD-10-CM

## 2014-06-17 DIAGNOSIS — M25561 Pain in right knee: Principal | ICD-10-CM

## 2014-06-17 NOTE — Telephone Encounter (Signed)
Pt informed. Judson Tsan Dawn  

## 2014-06-20 ENCOUNTER — Other Ambulatory Visit: Payer: Self-pay | Admitting: *Deleted

## 2014-06-20 MED ORDER — LEVOTHYROXINE SODIUM 112 MCG PO TABS: ORAL_TABLET | ORAL | Status: DC

## 2014-06-23 ENCOUNTER — Encounter: Payer: Self-pay | Admitting: Pharmacist

## 2014-06-23 ENCOUNTER — Ambulatory Visit (INDEPENDENT_AMBULATORY_CARE_PROVIDER_SITE_OTHER): Payer: Medicare HMO | Admitting: Pharmacist

## 2014-06-23 VITALS — BP 127/78 | HR 72 | Ht 67.0 in | Wt 250.0 lb

## 2014-06-23 DIAGNOSIS — I10 Essential (primary) hypertension: Secondary | ICD-10-CM

## 2014-06-23 DIAGNOSIS — E119 Type 2 diabetes mellitus without complications: Secondary | ICD-10-CM

## 2014-06-23 NOTE — Assessment & Plan Note (Signed)
Patient expressed goal weight of 240lbs by January with the ultimate goal of 170lbs. Patient encouraged to eat more vegetables and a slight increase in fruit. She is also to cut back on the amount of sweet tea she is drinking and to limit her carbohydrate intake. Patient has an appointment with the nutritionist at the end of November. Patient is to keep a food diary 3 days prior to that appointment. Patient verbalized understanding.

## 2014-06-23 NOTE — Patient Instructions (Signed)
It was great seeing you today. Continue Lantus 55 units and Victoza 1.8mg  daily. Ensure you are taking amlodipine 10mg  daily and lisinopril 20-12.5 two tablets daily for your blood pressure.  Follow up with the pharmacy clinic after the first of the year. Be sure to keep a food diary 3 days prior to your nutrition appointment. Have a good holiday season!

## 2014-06-23 NOTE — Progress Notes (Signed)
Patient ID: Kaitlyn Ferguson, female   DOB: 01/20/1946, 68 y.o.   MRN: [id number] Reviewed: Agree with Dr. Graylin Shiver documentation and management.

## 2014-06-23 NOTE — Assessment & Plan Note (Signed)
Longstanding history of Diabetes. Currently taking Lantus 55 units and Victoza 1.8mg  daily.   Denies hypoglycemic events and is able to verbalize appropriate hypoglycemia management plan.  Reports adherence with medication. Control is suboptimal due to dietary indiscretion and reluctance to meal time insulin.  Continued basal insulin Lantus (insulin glargine) and  Victoza (liraglutide) at the same dosages. Patient counseled to eat more vegetables and fruit and to cut down sweet tea to <1 glass a day and to limit carbohydrates. Encouraged weight loss. Next A1C anticipated January 2015.  Written patient instructions provided.    Patient expressed goal weight of 240lbs by January with the ultimate goal of 170lbs. Patient encouraged to eat more vegetables and a slight increase in fruit. She is also to cut back on the amount of sweet tea she is drinking and to limit her carbohydrate intake. Patient has an appointment with the nutritionist at the end of November. Patient is to keep a food diary 3 days prior to that appointment. Patient verbalized understanding.

## 2014-06-23 NOTE — Progress Notes (Signed)
S:    Patient arrives in good spirits. Presents for diabetes management follow up.   Patient reports having long history of Diabetes. Patient explains her readings haven't been that good lately and she has also been less active.  Reports taking two tablets of amlodipine daily and one tablet of lisinopril/HCTZ daily.   O:  . Lab Results  Component Value Date   HGBA1C 7.9 06/01/2014     home fasting CBG readings of 154 - 239  2 hour post-prandial/random CBG readings of 142 - 236.  A/P: DM: Longstanding history of Diabetes. Currently taking Lantus 55 units and Victoza 1.8mg  daily.   Denies hypoglycemic events and is able to verbalize appropriate hypoglycemia management plan.  Reports adherence with medication. Control is suboptimal due to dietary indiscretion and reluctance to meal time insulin.  Continued basal insulin Lantus (insulin glargine) and  Victoza (liraglutide) at the same dosages. Patient counseled to eat more vegetables and fruit and to cut down sweet tea to <1 glass a day and to limit carbohydrates. Encouraged weight loss. Next A1C anticipated January 2015.  Written patient instructions provided.    Obesity: Patient expressed goal weight of 240lbs by January with the ultimate goal of 170lbs. Patient encouraged to eat more vegetables and a slight increase in fruit. She is also to cut back on the amount of sweet tea she is drinking and to limit her carbohydrate intake. Patient has an appointment with the nutritionist at the end of November. Patient is to keep a food diary 3 days prior to that appointment. Patient verbalized understanding.   HTN: Dosing of amlodipine and lisinopril/HCTZ was clarified to 1 tablet of amlodipine 10mg  daily and 2 tablets of lisinopril/HCTZ 20/12.5mg  daily. Reassess at next appointment. Follow up in Pharmacist Clinic Visit after the first of the year.   Total time in face to face counseling 20 minutes.  Patient seen with Katherine Roan,  PharmD Resident. Marland Kitchen

## 2014-06-23 NOTE — Assessment & Plan Note (Signed)
Hypertension at goal however dosing of lisinopril and amlodipine was clarified to:   two  Lisinopril/HCTZ 20mg /12.5mg  and one amlodipine 10mg  daily.  Reevaluate BP at next visit.

## 2014-07-05 ENCOUNTER — Encounter: Payer: Self-pay | Admitting: Family Medicine

## 2014-07-05 ENCOUNTER — Ambulatory Visit (INDEPENDENT_AMBULATORY_CARE_PROVIDER_SITE_OTHER): Payer: Commercial Managed Care - HMO | Admitting: Family Medicine

## 2014-07-05 VITALS — BP 127/70 | HR 79 | Ht 67.0 in | Wt 250.0 lb

## 2014-07-05 DIAGNOSIS — M159 Polyosteoarthritis, unspecified: Secondary | ICD-10-CM

## 2014-07-05 NOTE — Progress Notes (Signed)
Kaitlyn Ferguson is a 68 y.o. female who presents today for X-ray review of her B/L knees   L/R Knee OA - Has obtained knee x-rays B/L, however, she has been able to continue to exercise when taking mobic daily.  Started on Celebrex recently due to GI upset of mobic and has been able to walk daily.  Pain worse in AM or after long bouts of exercise/night.  Does not want injection or discuss TKA at this time.   Past Medical History  Diagnosis Date  . Arthritis   . Diabetes mellitus   . Cholelithiasis   . Hyperlipidemia   . Hypothyroidism   . Obesity   . DJD (degenerative joint disease) 10/07/2008  . Osteomyelitis of ankle     Complications of Right ankle injury  . DJD (degenerative joint disease)   . Hypertension   . Neuropathy     History  Smoking status  . Never Smoker   Smokeless tobacco  . Never Used    Comment: No tobacco currently    Family History  Problem Relation Age of Onset  . Diabetes Brother   . Diabetes Sister     DM2-lost weight-now okay  . Heart attack Father     First MI- early 48's  . Stroke Mother   . Heart failure Mother   . Multiple myeloma Mother     Deceased at 85 due to same    Current Outpatient Prescriptions on File Prior to Visit  Medication Sig Dispense Refill  . amLODipine (NORVASC) 10 MG tablet Take 1 tablet (10 mg total) by mouth every evening. 90 tablet 3  . aspirin 81 MG tablet Take 81 mg by mouth at bedtime.     . B-D ULTRA-FINE 33 LANCETS MISC Use as directed 100 each 11  . celecoxib (CELEBREX) 200 MG capsule Take 1 capsule (200 mg total) by mouth daily. 90 capsule 3  . Insulin Glargine (LANTUS) 100 UNIT/ML Solostar Pen Inject 55 Units into the skin every morning. Dispense 3 month supply    . Insulin Pen Needle (B-D UF III MINI PEN NEEDLES) 31G X 5 MM MISC Dispense qt sufficient for two shots daily 100 each 11  . levothyroxine (SYNTHROID, LEVOTHROID) 112 MCG tablet take 1 tablet by mouth once daily 90 tablet 3  . Liraglutide  (VICTOZA) 18 MG/3ML SOLN injection Inject 1.8 mg into the skin daily.     Marland Kitchen lisinopril-hydrochlorothiazide (ZESTORETIC) 20-12.5 MG per tablet Take 2 tablets by mouth daily. 180 tablet 3  . Magnesium Oxide 250 MG TABS Take 2 tablets by mouth every morning.    Marland Kitchen omeprazole (PRILOSEC) 40 MG capsule Take 1 capsule (40 mg total) by mouth daily. 90 capsule 3  . rosuvastatin (CRESTOR) 20 MG tablet take 1 tablet by mouth once daily 30 tablet 11  . [DISCONTINUED] Insulin Syringe-Needle U-100 31G X 5/16" 1 ML MISC Use with insulin as directed. 100 each 11   No current facility-administered medications on file prior to visit.    ROS: Per HPI.  All other systems reviewed and are negative.   Physical Exam Filed Vitals:   07/05/14 1445  BP: 127/70  Pulse: 79    Physical Examination: General appearance - alert, well appearing, and in no distress Heart - normal rate and regular rhythm, no murmurs noted L Knee - + Valgus, + crepitus, Flexion 110-120, Extension 0-5, + medial/lateral joint line     Lab Results  Component Value Date   HGBA1C 7.9 06/01/2014  L/R Knee X-ray, 4 view, standing, interpreted by myself today - Tricompartmental DJD, L > R, osteophyte formation, JSN B/L L>R

## 2014-07-05 NOTE — Assessment & Plan Note (Signed)
Knee x-rays reviewed in depth with pt.  L Knee showing severe osteophytosis, JSN, sclerosis, and subchondral bone cyst.  R knee showing JSN, osteophytosis medial compartment - Continue with Celebrex 200 mg daily - Start Tylenol 500 mg-1000 mg TID  - Discussed other options again with Kaitlyn Ferguson including PT, medication (Cymbalta/Tramadol/Opiods), steroid injection, Hyuloronic Acid, knee hinge brace, PRP and eventual TKA.  No questions today, will call when interested in steroid injection.

## 2014-07-19 ENCOUNTER — Ambulatory Visit (INDEPENDENT_AMBULATORY_CARE_PROVIDER_SITE_OTHER): Payer: Commercial Managed Care - HMO | Admitting: Family Medicine

## 2014-07-19 ENCOUNTER — Encounter: Payer: Self-pay | Admitting: Family Medicine

## 2014-07-19 VITALS — Ht 67.0 in | Wt 251.5 lb

## 2014-07-19 DIAGNOSIS — E119 Type 2 diabetes mellitus without complications: Secondary | ICD-10-CM

## 2014-07-19 NOTE — Patient Instructions (Addendum)
  -   When you come in next time, please bring your BG logbook.    Diet Recommendations for Diabetes   Starchy (carb) foods include: Bread, rice, pasta, potatoes, corn, crackers, bagels, muffins, all baked goods.  (Fruits, milk, and yogurt also have carbohydrate, but most of these foods will not spike your blood sugar as the starchy foods will.)  A few fruits do cause high blood sugars; use small portions of bananas (limit to 1/2 at a time), grapes, watermelon, and most tropical fruits.    Protein foods include: Meat, fish, poultry, eggs, dairy foods, and beans such as pinto and kidney beans (beans also provide carbohydrate).   1. Eat at least 3 meals and 1-2 snacks per day. Never go more than 4-5 hours while awake without eating. Eat breakfast within the first hour of getting up.   2. Limit starchy foods to TWO per meal and ONE per snack. ONE portion of a starchy  food is equal to the following:   - ONE slice of bread (or its equivalent, such as half of a hamburger bun).   - 1/2 cup of a "scoopable" starchy food such as potatoes or rice.   - 15 grams of carbohydrate as shown on food label.  3. Include at every meal: a protein food, a carb food, and vegetables and/or fruit.   - Obtain twice as many veg's as protein or carbohydrate foods for both lunch and dinner.   - Fresh or frozen veg's are best.  (Taste preferences are learned.)  - Try to keep frozen veg's on hand for a quick vegetable serving.     - Thanksgiving dinner:  See handout provided for you today.

## 2014-07-19 NOTE — Progress Notes (Signed)
Medical Nutrition Therapy:  Appt start time: 1100 end time:  1200.  Assessment:  Primary concerns today: Weight management and Blood sugar control.   Learning Readiness: Change in progress  Barriers to learning/adherence to lifestyle change: attraction to sweets.   Usual eating pattern includes 3 meals and 1-3 snacks per day. Frequent foods and beverages include bread, coffee, [place] bacon, eggs, cheese.  Avoided foods include none.   Usual physical activity includes Zumba class 1 X wk and cardio class 3 X wk at [facility].  Sometimes also walks the track or TM or recumbent elliptical at the [facility]    FBG has been running 150s to 160s past couple of weeks, with occasional low-200s.    24-hr recall: (Up at 4:30 AM; could not sleep) B (6 AM)-   2 slc bread, 1 slc Amer cheese, 2 slc ham, 1 c blk coffee Snk ( AM)-   --- L (12:30 PM)-  6" stk & chs sub w/ let, pepper, onions, tom, lite mayo; 1 c sweet tea Snk (1 PM)-  2 choc Swiss rolls, 4 pcs hard candy Snk (4 PM)-  5 oz Yoplait f-f yogurt D (8 PM)-  2 c tater tots, water Snk ( PM)-  --- Typical day? Yes.    Progress Towards Goal(s):  In progress.   Nutritional Diagnosis:  NI-5.8.2 Excessive carbohydrate intake As related to BG control.  As evidenced by excessive carb intake at some meals.    Intervention:  Nutrition education.  Handouts given during visit include:  AVS  Handout on making good Thanksgiving choices.   Demonstrated degree of understanding via:  Teach Back   Monitoring/Evaluation:  Dietary intake, exercise, BG, and body weight in 6 week(s).  No appts available sooner.

## 2014-07-20 ENCOUNTER — Telehealth: Payer: Self-pay | Admitting: *Deleted

## 2014-07-20 DIAGNOSIS — E119 Type 2 diabetes mellitus without complications: Secondary | ICD-10-CM

## 2014-07-25 MED ORDER — INSULIN PEN NEEDLE 31G X 5 MM MISC: Status: DC

## 2014-07-25 MED ORDER — LIRAGLUTIDE 18 MG/3ML ~~LOC~~ SOPN: 1.8000 mg | PEN_INJECTOR | Freq: Every day | SUBCUTANEOUS | Status: DC

## 2014-07-25 MED ORDER — BD LANCET ULTRAFINE 33G MISC: Status: DC

## 2014-07-25 NOTE — Telephone Encounter (Signed)
Complete.  Ready for pick up.  Thanks [employer]. Madai Nuccio, DO of [facility] 07/25/2014, 1:12 PM

## 2014-09-13 ENCOUNTER — Ambulatory Visit: Payer: Commercial Managed Care - HMO | Admitting: Family Medicine

## 2014-09-28 ENCOUNTER — Ambulatory Visit: Payer: Commercial Managed Care - HMO | Admitting: Family Medicine

## 2014-10-05 ENCOUNTER — Ambulatory Visit (INDEPENDENT_AMBULATORY_CARE_PROVIDER_SITE_OTHER): Payer: Commercial Managed Care - HMO | Admitting: Family Medicine

## 2014-10-05 ENCOUNTER — Encounter: Payer: Self-pay | Admitting: Family Medicine

## 2014-10-05 VITALS — BP 128/68 | HR 80 | Temp 99.0°F | Resp 14 | Ht 67.0 in | Wt 252.5 lb

## 2014-10-05 DIAGNOSIS — E785 Hyperlipidemia, unspecified: Secondary | ICD-10-CM

## 2014-10-05 DIAGNOSIS — I10 Essential (primary) hypertension: Secondary | ICD-10-CM

## 2014-10-05 DIAGNOSIS — E119 Type 2 diabetes mellitus without complications: Secondary | ICD-10-CM

## 2014-10-05 LAB — LIPID PANEL
Cholesterol: 116 mg/dL (ref 0–200)
HDL: 34 mg/dL — ABNORMAL LOW (ref >39)
LDL Cholesterol: 35 mg/dL (ref 0–99)
Total CHOL/HDL Ratio: 3.4 Ratio
Triglycerides: 237 mg/dL — ABNORMAL HIGH (ref <150)
VLDL: 47 mg/dL — ABNORMAL HIGH (ref 0–40)

## 2014-10-05 LAB — CBC
HCT: 40 % (ref 36.0–46.0)
Hemoglobin: 13.2 g/dL (ref 12.0–15.0)
MCH: 25.9 pg — ABNORMAL LOW (ref 26.0–34.0)
MCHC: 33 g/dL (ref 30.0–36.0)
MCV: 78.6 fL (ref 78.0–100.0)
MPV: 11 fL (ref 8.6–12.4)
Platelets: 267 10*3/uL (ref 150–400)
RBC: 5.09 MIL/uL (ref 3.87–5.11)
RDW: 15 % (ref 11.5–15.5)
WBC: 9.2 10*3/uL (ref 4.0–10.5)

## 2014-10-05 LAB — COMPREHENSIVE METABOLIC PANEL
ALT: 24 U/L (ref 0–35)
AST: 25 U/L (ref 0–37)
Albumin: 4 g/dL (ref 3.5–5.2)
Alkaline Phosphatase: 78 U/L (ref 39–117)
BUN: 29 mg/dL — ABNORMAL HIGH (ref 6–23)
CO2: 28 mEq/L (ref 19–32)
Calcium: 9.9 mg/dL (ref 8.4–10.5)
Chloride: 101 mEq/L (ref 96–112)
Creat: 0.95 mg/dL (ref 0.50–1.10)
Glucose, Bld: 128 mg/dL — ABNORMAL HIGH (ref 70–99)
Potassium: 3.9 mEq/L (ref 3.5–5.3)
Sodium: 140 mEq/L (ref 135–145)
Total Bilirubin: 0.4 mg/dL (ref 0.2–1.2)
Total Protein: 6.6 g/dL (ref 6.0–8.3)

## 2014-10-05 LAB — POCT GLYCOSYLATED HEMOGLOBIN (HGB A1C): Hemoglobin A1C: 8.4

## 2014-10-05 NOTE — Progress Notes (Signed)
Kaitlyn Ferguson is a 69 y.o. female who presents today for DM, HTN, and HLD, and L Knee OA   B/L Knee OA - Pt with known tricompartmental DJD L>R knee.  Previously well controlled on Mobic and able to exercise on this but had some GI discomfort so had to stop the medication.  This has led to decreased exercise.  Switched to Celebrex 200 mg qd along with tylenol 1000 mg BID-TID.  Discussed other options with her including PT, medications, corticosteroid injections, Synvisc, PRP, TKA discussion with ortho.  She has denied wanting any of these options in the past.    DM II - Fasting sugars have been borderline controlled (130-190) in the AM.  She continues to be compliant with her Lantus (55 U currently) and Vyctoza (1.8) daily, and denies any hypoglycemic events including any nausea, dizziness, blurred vision, diplopia, nightmares.  HTN - At goal, on HCTZ/Lisinopril 20/12.5 mg w/o SE.  As well on Norvasc 10 mg qhs w/o SE.  She is compliant with this medication and she denies any lightheadedness, dizziness, blurred vision, edema, palpitations.   Hyperlipidemia - Compliant with crestor, denies myalgias.   Past Medical History  Diagnosis Date  . Arthritis   . Diabetes mellitus   . Cholelithiasis   . Hyperlipidemia   . Hypothyroidism   . Obesity   . DJD (degenerative joint disease) 10/07/2008  . Osteomyelitis of ankle     Complications of Right ankle injury  . DJD (degenerative joint disease)   . Hypertension   . Neuropathy     History  Smoking status  . Never Smoker   Smokeless tobacco  . Never Used    Comment: No tobacco currently    Family History  Problem Relation Age of Onset  . Diabetes Brother   . Diabetes Sister     DM2-lost weight-now okay  . Heart attack Father     First MI- early 28's  . Stroke Mother   . Heart failure Mother   . Multiple myeloma Mother     Deceased at 45 due to same    Current Outpatient Prescriptions on File Prior to Visit  Medication Sig  Dispense Refill  . acetaminophen (TYLENOL) 650 MG CR tablet Take 1,300 mg by mouth 1 day or 1 dose. For arthritis.    Marland Kitchen amLODipine (NORVASC) 10 MG tablet Take 1 tablet (10 mg total) by mouth every evening. 90 tablet 3  . aspirin 81 MG tablet Take 81 mg by mouth at bedtime.     . B-D ULTRA-FINE 33 LANCETS MISC Check BID.  Supplies sufficient for one month 100 each 11  . celecoxib (CELEBREX) 200 MG capsule Take 1 capsule (200 mg total) by mouth daily. 90 capsule 3  . Insulin Glargine (LANTUS) 100 UNIT/ML Solostar Pen Inject 55 Units into the skin every morning. Dispense 3 month supply    . Insulin Pen Needle (B-D UF III MINI PEN NEEDLES) 31G X 5 MM MISC Dispense qt sufficient for two shots daily.  Supplies sufficient for one month. 100 each 11  . levothyroxine (SYNTHROID, LEVOTHROID) 112 MCG tablet take 1 tablet by mouth once daily 90 tablet 3  . Liraglutide 18 MG/3ML SOPN Inject 1.8 mg into the skin daily. 3 mL 11  . lisinopril-hydrochlorothiazide (ZESTORETIC) 20-12.5 MG per tablet Take 2 tablets by mouth daily. 180 tablet 3  . Magnesium Oxide 250 MG TABS Take 2 tablets by mouth every morning.    Marland Kitchen omeprazole (PRILOSEC) 40 MG capsule Take  1 capsule (40 mg total) by mouth daily. 90 capsule 3  . rosuvastatin (CRESTOR) 20 MG tablet take 1 tablet by mouth once daily 30 tablet 11  . [DISCONTINUED] Insulin Syringe-Needle U-100 31G X 5/16" 1 ML MISC Use with insulin as directed. 100 each 11   No current facility-administered medications on file prior to visit.    ROS: Per HPI.  All other systems reviewed and are negative.   Physical Exam Filed Vitals:   10/05/14 1108  BP: 128/68  Pulse: 80  Temp: 99 F (37.2 C)  Resp: 14    Physical Examination: General appearance - alert, well appearing, and in no distress Heart - normal rate and regular rhythm, no murmurs noted L Knee - + Valgus, + crepitus, Flexion 110-120, Extension 0-5, + medial/lateral joint line tenderness.    Lab Results   Component Value Date   HGBA1C 8.4 10/05/2014

## 2014-10-05 NOTE — Assessment & Plan Note (Signed)
Continue Crestor 20 mg qd - Obtain Lipid Panel today

## 2014-10-05 NOTE — Assessment & Plan Note (Signed)
Well controlled, continue current regimen - Obtain CMET, CBC, and Lipid Panel today

## 2014-10-05 NOTE — Assessment & Plan Note (Addendum)
Continues on Lantus 55 U Am/Victoza, good fasting AM control around 130 usually.  Denies any hypoglycemic episodes.  - Continue current regimen, increase to 60 U qam.  Directed to increase by 1-2 u qAM if > 150 for 2 consecutive days until <130 for 2 consecutive days.  Also if < 80 for 2 consecutive days to decrease by 1-2 U.   - F/U with pharmacy prior to June 2016 - A1C today  - Goal < 8 in elderly pt, would consider addition of metformin 500 BID at next visit if still elevated as renal fxn will allow this.

## 2014-10-18 ENCOUNTER — Other Ambulatory Visit: Payer: Self-pay | Admitting: Family Medicine

## 2014-10-19 ENCOUNTER — Other Ambulatory Visit: Payer: Self-pay | Admitting: Family Medicine

## 2014-11-14 ENCOUNTER — Other Ambulatory Visit: Payer: Self-pay | Admitting: *Deleted

## 2014-11-14 DIAGNOSIS — E119 Type 2 diabetes mellitus without complications: Secondary | ICD-10-CM

## 2014-11-14 MED ORDER — INSULIN GLARGINE 100 UNIT/ML SOLOSTAR PEN: 55.0000 [IU] | PEN_INJECTOR | SUBCUTANEOUS | Status: DC

## 2014-12-07 ENCOUNTER — Other Ambulatory Visit: Payer: Self-pay | Admitting: *Deleted

## 2014-12-07 MED ORDER — OMEPRAZOLE 40 MG PO CPDR: 40.0000 mg | DELAYED_RELEASE_CAPSULE | Freq: Every day | ORAL | Status: DC

## 2014-12-08 ENCOUNTER — Other Ambulatory Visit: Payer: Self-pay | Admitting: *Deleted

## 2014-12-08 MED ORDER — ROSUVASTATIN CALCIUM 20 MG PO TABS: ORAL_TABLET | ORAL | Status: DC

## 2014-12-27 ENCOUNTER — Ambulatory Visit (INDEPENDENT_AMBULATORY_CARE_PROVIDER_SITE_OTHER): Payer: Commercial Managed Care - HMO | Admitting: Family Medicine

## 2014-12-27 ENCOUNTER — Encounter: Payer: Self-pay | Admitting: Family Medicine

## 2014-12-27 VITALS — BP 155/82 | HR 65 | Ht 67.0 in | Wt 255.0 lb

## 2014-12-27 DIAGNOSIS — Z0000 Encounter for general adult medical examination without abnormal findings: Secondary | ICD-10-CM

## 2014-12-27 DIAGNOSIS — M159 Polyosteoarthritis, unspecified: Secondary | ICD-10-CM | POA: Diagnosis not present

## 2014-12-27 MED ORDER — METHYLPREDNISOLONE ACETATE 40 MG/ML IJ SUSP
40.0000 mg | Freq: Once | INTRAMUSCULAR | Status: AC
  Administered 2014-12-27: 40 mg via INTRA_ARTICULAR

## 2014-12-27 NOTE — Progress Notes (Signed)
Subjective:    Kaitlyn Ferguson is a 69 y.o. female who presents for Medicare Annual/Subsequent preventive examination.  Preventive Screening-Counseling & Management  Tobacco History  Smoking status  . Never Smoker   Smokeless tobacco  . Never Used    Comment: No tobacco currently     Problems Prior to Visit 1. HTN 2. Hyperlipdemia 3. DM II   Current Problems (verified) Patient Active Problem List   Diagnosis Date Noted  . Subacromial impingement of left shoulder 06/01/2014  . Trochanteric bursitis of right hip 02/14/2014  . Cervical radiculopathy 11/06/2010  . Diabetes mellitus without complication 31/49/7026  . Hyperlipidemia 03/18/2007  . HYPOTHYROIDISM 10/23/2006  . OBESITY, NOS 10/23/2006  . HYPERTENSION, BENIGN SYSTEMIC 10/23/2006  . ARTHRITIS, DEGENERATIVE 10/23/2006    Medications Prior to Visit Current Outpatient Prescriptions on File Prior to Visit  Medication Sig Dispense Refill  . ACCU-CHEK AVIVA PLUS test strip     . acetaminophen (TYLENOL) 650 MG CR tablet Take 1,300 mg by mouth 1 day or 1 dose. For arthritis.    Marland Kitchen amLODipine (NORVASC) 10 MG tablet take 1 tablet by mouth every evening 90 tablet 3  . aspirin 81 MG tablet Take 81 mg by mouth at bedtime.     . B-D ULTRA-FINE 33 LANCETS MISC Check BID.  Supplies sufficient for one month 100 each 11  . celecoxib (CELEBREX) 200 MG capsule Take 1 capsule (200 mg total) by mouth daily. 90 capsule 3  . Insulin Glargine (LANTUS) 100 UNIT/ML Solostar Pen Inject 55 Units into the skin every morning. Dispense 3 month supply 15 mL 11  . Insulin Pen Needle (B-D UF III MINI PEN NEEDLES) 31G X 5 MM MISC Dispense qt sufficient for two shots daily.  Supplies sufficient for one month. 100 each 11  . levothyroxine (SYNTHROID, LEVOTHROID) 112 MCG tablet take 1 tablet by mouth once daily 90 tablet 3  . Liraglutide 18 MG/3ML SOPN Inject 1.8 mg into the skin daily. 3 mL 11  . lisinopril-hydrochlorothiazide  (PRINZIDE,ZESTORETIC) 20-12.5 MG per tablet take 2 tablets by mouth once daily 180 tablet 3  . Magnesium Oxide 250 MG TABS Take 2 tablets by mouth every morning.    Marland Kitchen omeprazole (PRILOSEC) 40 MG capsule Take 1 capsule (40 mg total) by mouth daily. 90 capsule 3  . rosuvastatin (CRESTOR) 20 MG tablet take 1 tablet by mouth once daily 30 tablet 11  . [DISCONTINUED] Insulin Syringe-Needle U-100 31G X 5/16" 1 ML MISC Use with insulin as directed. 100 each 11   No current facility-administered medications on file prior to visit.    Current Medications (verified) Current Outpatient Prescriptions  Medication Sig Dispense Refill  . ACCU-CHEK AVIVA PLUS test strip     . acetaminophen (TYLENOL) 650 MG CR tablet Take 1,300 mg by mouth 1 day or 1 dose. For arthritis.    Marland Kitchen amLODipine (NORVASC) 10 MG tablet take 1 tablet by mouth every evening 90 tablet 3  . aspirin 81 MG tablet Take 81 mg by mouth at bedtime.     . B-D ULTRA-FINE 33 LANCETS MISC Check BID.  Supplies sufficient for one month 100 each 11  . celecoxib (CELEBREX) 200 MG capsule Take 1 capsule (200 mg total) by mouth daily. 90 capsule 3  . Insulin Glargine (LANTUS) 100 UNIT/ML Solostar Pen Inject 55 Units into the skin every morning. Dispense 3 month supply 15 mL 11  . Insulin Pen Needle (B-D UF III MINI PEN NEEDLES) 31G X 5 MM MISC  Dispense qt sufficient for two shots daily.  Supplies sufficient for one month. 100 each 11  . levothyroxine (SYNTHROID, LEVOTHROID) 112 MCG tablet take 1 tablet by mouth once daily 90 tablet 3  . Liraglutide 18 MG/3ML SOPN Inject 1.8 mg into the skin daily. 3 mL 11  . lisinopril-hydrochlorothiazide (PRINZIDE,ZESTORETIC) 20-12.5 MG per tablet take 2 tablets by mouth once daily 180 tablet 3  . Magnesium Oxide 250 MG TABS Take 2 tablets by mouth every morning.    Marland Kitchen omeprazole (PRILOSEC) 40 MG capsule Take 1 capsule (40 mg total) by mouth daily. 90 capsule 3  . rosuvastatin (CRESTOR) 20 MG tablet take 1 tablet by  mouth once daily 30 tablet 11  . [DISCONTINUED] Insulin Syringe-Needle U-100 31G X 5/16" 1 ML MISC Use with insulin as directed. 100 each 11   No current facility-administered medications for this visit.     Allergies (verified) Vancomycin - Pruritis    PAST HISTORY  Family History Family History  Problem Relation Age of Onset  . Diabetes Brother   . Diabetes Sister     DM2-lost weight-now okay  . Heart attack Father     First MI- early 68's  . Stroke Mother   . Heart failure Mother   . Multiple myeloma Mother     Deceased at 41 due to same    Social History History  Substance Use Topics  . Smoking status: Never Smoker   . Smokeless tobacco: Never Used     Comment: No tobacco currently  . Alcohol Use: No     Are there smokers in your home (other than you)? No  Risk Factors Current exercise habits: Gym/ health club routine includes zumba, cardio.  Dietary issues discussed: None    Cardiac risk factors: advanced age (older than 35 for men, 56 for women), diabetes mellitus, dyslipidemia and hypertension.  Depression Screen (Note: if answer to either of the following is "Yes", a more complete depression screening is indicated)   Over the past two weeks, have you felt down, depressed or hopeless? No  Over the past two weeks, have you felt little interest or pleasure in doing things? No  Have you lost interest or pleasure in daily life? No  Do you often feel hopeless? No  Do you cry easily over simple problems? No  Activities of Daily Living In your present state of health, do you have any difficulty performing the following activities?:  Driving? No Managing money?  No Feeding yourself? No Getting from bed to chair? No Climbing a flight of stairs? No Preparing food and eating?: No Bathing or showering? No Getting dressed: No Getting to the toilet? No Using the toilet:No Moving around from place to place: No In the past year have you fallen or had a near  fall?:Yes - In front of apartment, tripping over feet, no injury    Are you sexually active?  No  Do you have more than one partner?  No  Hearing Difficulties: No Do you often ask people to speak up or repeat themselves? No Do you experience ringing or noises in your ears? No Do you have difficulty understanding soft or whispered voices? No   Do you feel that you have a problem with memory? No  Do you often misplace items? No  Do you feel safe at home?  Yes  Cognitive Testing  Alert? Yes  Normal Appearance?Yes  Oriented to person? Yes  Place? Yes   Time? Yes  Recall of three objects?  Yes  Can perform simple calculations? Yes  Displays appropriate judgment?Yes  Can read the correct time from a watch face?Yes   Advanced Directives have been discussed with the patient? Yes - Living Will.    List the Names of Other Physician/Practitioners you currently use: 1.  Dr. Zadie Rhine - Ophtho  2. Dr. Frederico Hamman - Eye surgeon   Indicate any recent Medical Services you may have received from other than [facility] providers in the past year (date may be approximate).  Immunization History  Administered Date(s) Administered  . Influenza Split 05/10/2011, 07/26/2011, 05/12/2012  . Influenza Whole 06/01/2008, 08/11/2009, 09/05/2010  . Influenza,inj,Quad PF,36+ Mos 08/04/2013, 06/01/2014  . Td 10/01/2004    Screening Tests Health Maintenance  Topic Date Due  . PNA vac Low Risk Adult (1 of 2 - PCV13) 04/07/2011  . OPHTHALMOLOGY EXAM  12/25/2014  . ZOSTAVAX  10/11/2015 (Originally 04/06/2006)  . TETANUS/TDAP  10/17/2015 (Originally 10/01/2014)  . INFLUENZA VACCINE  03/27/2015  . HEMOGLOBIN A1C  04/05/2015  . FOOT EXAM  06/02/2015  . LIPID PANEL  10/06/2015  . MAMMOGRAM  05/12/2016  . COLONOSCOPY  06/17/2021  . DEXA SCAN  Completed    All answers were reviewed with the patient and necessary referrals were made:  Kennith Maes, DO   12/27/2014   History reviewed: allergies, current medications, past  family history, past medical history, past social history, past surgical history and problem list  Review of Systems Pertinent items are noted in HPI.         Assessment:     Medicare Visit       Plan:     During the course of the visit the patient was educated and counseled about appropriate screening and preventive services including:    Pneumococcal vaccine   Diet review for nutrition referral? Yes ____  Not Indicated _x___   Patient Instructions (the written plan) was given to the patient.  Medicare Attestation I have personally reviewed: The patient's medical and social history Their use of alcohol, tobacco or illicit drugs Their current medications and supplements The patient's functional ability including ADLs,fall risks, home safety risks, cognitive, and hearing and visual impairment Diet and physical activities Evidence for depression or mood disorders  The patient's weight, height, BMI, and visual acuity have been recorded in the chart.  I have made referrals, counseling, and provided education to the patient based on review of the above and I have provided the patient with a written personalized care plan for preventive services.     Kennith Maes, DO   12/27/2014

## 2014-12-27 NOTE — Patient Instructions (Signed)
Knee Injection Joint injections are shots. Your caregiver will place a needle into your knee joint. The needle is used to put medicine into the joint. These shots can be used to help treat different painful knee conditions such as osteoarthritis, bursitis, local flare-ups of rheumatoid arthritis, and pseudogout. Anti-inflammatory medicines such as corticosteroids and anesthetics are the most common medicines used for joint and soft tissue injections.  PROCEDURE  The skin over the kneecap will be cleaned with an antiseptic solution.  Your caregiver will inject a small amount of a local anesthetic (a medicine like Novocaine) just under the skin in the area that was cleaned.  After the area becomes numb, a second injection is done. This second injection usually includes an anesthetic and an anti-inflammatory medicine called a steroid or cortisone. The needle is carefully placed in between the kneecap and the knee, and the medicine is injected into the joint space.  After the injection is done, the needle is removed. Your caregiver may place a bandage over the injection site. The whole procedure takes no more than a couple of minutes. BEFORE THE PROCEDURE  Wash all of the skin around the entire knee area. Try to remove any loose, scaling skin. There is no other specific preparation necessary unless advised otherwise by your caregiver. LET YOUR CAREGIVER KNOW ABOUT:   Allergies.  Medications taken including herbs, eye drops, over the counter medications, and creams.  Use of steroids (by mouth or creams).  Possible pregnancy, if applicable.  Previous problems with anesthetics or Novocaine.  History of blood clots (thrombophlebitis).  History of bleeding or blood problems.  Previous surgery.  Other health problems. RISKS AND COMPLICATIONS Side effects from cortisone shots are rare. They include:   Slight bruising of the skin.  Shrinkage of the normal fatty tissue under the skin where  the shot was given.  Increase in pain after the shot.  Infection.  Weakening of tendons or tendon rupture.  Allergic reaction to the medicine.  Diabetics may have a temporary increase in their blood sugar after a shot.  Cortisone can temporarily weaken the immune system. While receiving these shots, you should not get certain vaccines. Also, avoid contact with anyone who has chickenpox or measles. Especially if you have never had these diseases or have not been previously immunized. Your immune system may not be strong enough to fight off the infection while the cortisone is in your system. AFTER THE PROCEDURE   You can go home after the procedure.  You may need to put ice on the joint 15-20 minutes every 3 or 4 hours until the pain goes away.  You may need to put an elastic bandage on the joint. HOME CARE INSTRUCTIONS   Only take over-the-counter or prescription medicines for pain, discomfort, or fever as directed by your caregiver.  You should avoid stressing the joint. Unless advised otherwise, avoid activities that put a lot of pressure on a knee joint, such as:  Jogging.  Bicycling.  Recreational climbing.  Hiking.  Laying down and elevating the leg/knee above the level of your heart can help to minimize swelling. SEEK MEDICAL CARE IF:   You have repeated or worsening swelling.  There is drainage from the puncture area.  You develop red streaking that extends above or below the site where the needle was inserted. SEEK IMMEDIATE MEDICAL CARE IF:   You develop a fever.  You have pain that gets worse even though you are taking pain medicine.  The area is   red and warm, and you have trouble moving the joint. MAKE SURE YOU:   Understand these instructions.  Will watch your condition.  Will get help right away if you are not doing well or get worse. Document Released: 11/03/2006 Document Revised: 11/04/2011 Document Reviewed: 07/31/2007 [employer] Patient  Information 2015 [employer]. This information is not intended to replace advice given to you by your health care provider. Make sure you discuss any questions you have with your health care provider.  

## 2014-12-27 NOTE — Addendum Note (Signed)
Addended by: [place] Pink on: 12/27/2014 05:04 PM   Modules accepted: Orders

## 2014-12-27 NOTE — Assessment & Plan Note (Signed)
Knee x-rays reviewed in depth with pt.  L Knee showing severe osteophytosis, JSN, sclerosis, and subchondral bone cyst.  R knee showing JSN, osteophytosis medial compartment - Continue with Celebrex 200 mg daily - Start Tylenol 500 mg-1000 mg TID  - Discussed other options again with her including PT, medication (Cymbalta/Tramadol/Opiods), steroid injection, Hyuloronic Acid, knee hinge brace, PRP and eventual TKA.   - Agreeable to steroid injection today.    Knee Injection Procedure Note  Pre-operative Diagnosis: left knee OA   Post-operative Diagnosis: same  Indications: Pain   Anesthesia: Lidocaine 2% without epinephrine without added sodium bicarbonate  Procedure Details   Verbal consent was obtained for the procedure, time out performed. The joint was prepped with Betadine and cleaned with alcohol wipe. A 21 1.5 gauge needle was inserted into the medial joint line.. 4 ml 2% lidocaine and 1 ml of 40 mg of Depo was then injected into the joint through the same needle. The needle was removed and the area cleansed and dressed.  Complications:  None; patient tolerated the procedure well.

## 2015-01-17 ENCOUNTER — Encounter: Payer: Self-pay | Admitting: Family Medicine

## 2015-01-17 ENCOUNTER — Ambulatory Visit (INDEPENDENT_AMBULATORY_CARE_PROVIDER_SITE_OTHER): Payer: Commercial Managed Care - HMO | Admitting: Family Medicine

## 2015-01-17 VITALS — BP 159/65 | HR 71 | Temp 98.3°F | Ht 67.0 in | Wt 258.0 lb

## 2015-01-17 DIAGNOSIS — L918 Other hypertrophic disorders of the skin: Secondary | ICD-10-CM

## 2015-01-17 DIAGNOSIS — M159 Polyosteoarthritis, unspecified: Secondary | ICD-10-CM

## 2015-01-17 MED ORDER — METHYLPREDNISOLONE ACETATE 80 MG/ML IJ SUSP
80.0000 mg | Freq: Once | INTRAMUSCULAR | Status: AC
  Administered 2015-01-17: 80 mg via INTRA_ARTICULAR

## 2015-01-17 NOTE — Patient Instructions (Signed)
Knee Injection Joint injections are shots. Your caregiver will place a needle into your knee joint. The needle is used to put medicine into the joint. These shots can be used to help treat different painful knee conditions such as osteoarthritis, bursitis, local flare-ups of rheumatoid arthritis, and pseudogout. Anti-inflammatory medicines such as corticosteroids and anesthetics are the most common medicines used for joint and soft tissue injections.  PROCEDURE  The skin over the kneecap will be cleaned with an antiseptic solution.  Your caregiver will inject a small amount of a local anesthetic (a medicine like Novocaine) just under the skin in the area that was cleaned.  After the area becomes numb, a second injection is done. This second injection usually includes an anesthetic and an anti-inflammatory medicine called a steroid or cortisone. The needle is carefully placed in between the kneecap and the knee, and the medicine is injected into the joint space.  After the injection is done, the needle is removed. Your caregiver may place a bandage over the injection site. The whole procedure takes no more than a couple of minutes. BEFORE THE PROCEDURE  Wash all of the skin around the entire knee area. Try to remove any loose, scaling skin. There is no other specific preparation necessary unless advised otherwise by your caregiver. LET YOUR CAREGIVER KNOW ABOUT:   Allergies.  Medications taken including herbs, eye drops, over the counter medications, and creams.  Use of steroids (by mouth or creams).  Possible pregnancy, if applicable.  Previous problems with anesthetics or Novocaine.  History of blood clots (thrombophlebitis).  History of bleeding or blood problems.  Previous surgery.  Other health problems. RISKS AND COMPLICATIONS Side effects from cortisone shots are rare. They include:   Slight bruising of the skin.  Shrinkage of the normal fatty tissue under the skin where  the shot was given.  Increase in pain after the shot.  Infection.  Weakening of tendons or tendon rupture.  Allergic reaction to the medicine.  Diabetics may have a temporary increase in their blood sugar after a shot.  Cortisone can temporarily weaken the immune system. While receiving these shots, you should not get certain vaccines. Also, avoid contact with anyone who has chickenpox or measles. Especially if you have never had these diseases or have not been previously immunized. Your immune system may not be strong enough to fight off the infection while the cortisone is in your system. AFTER THE PROCEDURE   You can go home after the procedure.  You may need to put ice on the joint 15-20 minutes every 3 or 4 hours until the pain goes away.  You may need to put an elastic bandage on the joint. HOME CARE INSTRUCTIONS   Only take over-the-counter or prescription medicines for pain, discomfort, or fever as directed by your caregiver.  You should avoid stressing the joint. Unless advised otherwise, avoid activities that put a lot of pressure on a knee joint, such as:  Jogging.  Bicycling.  Recreational climbing.  Hiking.  Laying down and elevating the leg/knee above the level of your heart can help to minimize swelling. SEEK MEDICAL CARE IF:   You have repeated or worsening swelling.  There is drainage from the puncture area.  You develop red streaking that extends above or below the site where the needle was inserted. SEEK IMMEDIATE MEDICAL CARE IF:   You develop a fever.  You have pain that gets worse even though you are taking pain medicine.  The area is   red and warm, and you have trouble moving the joint. MAKE SURE YOU:   Understand these instructions.  Will watch your condition.  Will get help right away if you are not doing well or get worse. Document Released: 11/03/2006 Document Revised: 11/04/2011 Document Reviewed: 07/31/2007 [employer] Patient  Information 2015 [employer]. This information is not intended to replace advice given to you by your health care provider. Make sure you discuss any questions you have with your health care provider.  

## 2015-01-17 NOTE — Assessment & Plan Note (Signed)
-   Continue with Celebrex 200 mg daily - Start Tylenol 500 mg-1000 mg TID  - Discussed other options again with her including PT, medication (Cymbalta/Tramadol/Opiods), steroid injection, Hyuloronic Acid, knee hinge brace, PRP and eventual TKA.   - Agreeable to steroid injection today.    Knee Injection Procedure Note  Pre-operative Diagnosis: right knee OA   Post-operative Diagnosis: same  Indications: Pain   Anesthesia: Lidocaine 2% without epinephrine without added sodium bicarbonate  Procedure Details   Verbal consent was obtained for the procedure, time out performed. The joint was prepped with Betadine and cleaned with alcohol wipe. A 21 1.5 gauge needle was inserted into the medial joint line.. 3 ml 2% lidocaine and 1 ml of 80 mg of Depo was then injected into the joint through the same needle. The needle was removed and the area cleansed and dressed.  Complications:  None; patient tolerated the procedure well.

## 2015-01-17 NOTE — Progress Notes (Signed)
Kaitlyn Ferguson is a 69 y.o. female who presents today for B/L Knee OA and Skin Tag removal    B/L Knee OA - Pt with known tricompartmental DJD L>R knee.  Previously well controlled on Mobic and able to exercise on this but had some GI discomfort so had to stop the medication.  This has led to decreased exercise.  Switched to Celebrex 200 mg qd along with tylenol 1000 mg BID-TID.  Discussed other options with her including PT, medications, corticosteroid injections, Synvisc, PRP, TKA discussion with ortho.  She has denied wanting any of these options in the past.   Visit from 12/27/14, injection into L knee which worked well for her.  Skin Tags - Multiple, scattered on head and neck.  Previously had cryotherapy w/o success.  Irritating, mostly the one around her R eye.   Past Medical History  Diagnosis Date  . Arthritis   . Diabetes mellitus   . Cholelithiasis   . Hyperlipidemia   . Hypothyroidism   . Obesity   . DJD (degenerative joint disease) 10/07/2008  . Osteomyelitis of ankle     Complications of Right ankle injury  . DJD (degenerative joint disease)   . Hypertension   . Neuropathy     History  Smoking status  . Never Smoker   Smokeless tobacco  . Never Used    Comment: No tobacco currently    Family History  Problem Relation Age of Onset  . Diabetes Brother   . Diabetes Sister     DM2-lost weight-now okay  . Heart attack Father     First MI- early 63's  . Stroke Mother   . Heart failure Mother   . Multiple myeloma Mother     Deceased at 47 due to same    Current Outpatient Prescriptions on File Prior to Visit  Medication Sig Dispense Refill  . ACCU-CHEK AVIVA PLUS test strip     . acetaminophen (TYLENOL) 650 MG CR tablet Take 1,300 mg by mouth 1 day or 1 dose. For arthritis.    Marland Kitchen amLODipine (NORVASC) 10 MG tablet take 1 tablet by mouth every evening 90 tablet 3  . aspirin 81 MG tablet Take 81 mg by mouth at bedtime.     . B-D ULTRA-FINE 33 LANCETS MISC Check  BID.  Supplies sufficient for one month 100 each 11  . celecoxib (CELEBREX) 200 MG capsule Take 1 capsule (200 mg total) by mouth daily. 90 capsule 3  . Insulin Glargine (LANTUS) 100 UNIT/ML Solostar Pen Inject 55 Units into the skin every morning. Dispense 3 month supply 15 mL 11  . Insulin Pen Needle (B-D UF III MINI PEN NEEDLES) 31G X 5 MM MISC Dispense qt sufficient for two shots daily.  Supplies sufficient for one month. 100 each 11  . levothyroxine (SYNTHROID, LEVOTHROID) 112 MCG tablet take 1 tablet by mouth once daily 90 tablet 3  . Liraglutide 18 MG/3ML SOPN Inject 1.8 mg into the skin daily. 3 mL 11  . lisinopril-hydrochlorothiazide (PRINZIDE,ZESTORETIC) 20-12.5 MG per tablet take 2 tablets by mouth once daily 180 tablet 3  . Magnesium Oxide 250 MG TABS Take 2 tablets by mouth every morning.    Marland Kitchen omeprazole (PRILOSEC) 40 MG capsule Take 1 capsule (40 mg total) by mouth daily. 90 capsule 3  . rosuvastatin (CRESTOR) 20 MG tablet take 1 tablet by mouth once daily 30 tablet 11  . [DISCONTINUED] Insulin Syringe-Needle U-100 31G X 5/16" 1 ML MISC Use with insulin as  directed. 100 each 11   No current facility-administered medications on file prior to visit.    ROS: Per HPI.  All other systems reviewed and are negative.   Physical Exam Filed Vitals:   01/17/15 1403  BP: 159/65  Pulse: 71  Temp: 98.3 F (36.8 C)    Physical Examination: General appearance - alert, well appearing, and in no distress Heart - normal rate and regular rhythm, no murmurs noted L Knee - + Valgus, + crepitus, Flexion 110-120, Extension 0-5, + medial/lateral joint line tenderness.  Face - scattered Acrochordon, most prominent at R periorbital region.     Lab Results  Component Value Date   HGBA1C 8.4 10/05/2014

## 2015-01-17 NOTE — Addendum Note (Signed)
Addended by: Christen Bame D on: 01/17/2015 05:12 PM   Modules accepted: Orders

## 2015-01-17 NOTE — Assessment & Plan Note (Signed)
3 skin tag in the face region, were removed using scissors and forceps after alcohol prep; hemostasis is obtained with pressure and discarded.     1. The patient is instructed to watch for signs of infection including erythema, pain,      purulent discharge, or crusting.  2. Written patient instruction given. 3. Follow up as needed for acute illness.

## 2015-02-15 ENCOUNTER — Encounter: Payer: Self-pay | Admitting: Family Medicine

## 2015-02-15 ENCOUNTER — Ambulatory Visit (INDEPENDENT_AMBULATORY_CARE_PROVIDER_SITE_OTHER): Payer: Commercial Managed Care - HMO | Admitting: Family Medicine

## 2015-02-15 VITALS — BP 138/59 | HR 80 | Temp 98.6°F | Ht 67.0 in | Wt 255.0 lb

## 2015-02-15 DIAGNOSIS — E785 Hyperlipidemia, unspecified: Secondary | ICD-10-CM | POA: Diagnosis not present

## 2015-02-15 DIAGNOSIS — E119 Type 2 diabetes mellitus without complications: Secondary | ICD-10-CM

## 2015-02-15 DIAGNOSIS — I10 Essential (primary) hypertension: Secondary | ICD-10-CM | POA: Diagnosis not present

## 2015-02-15 LAB — BASIC METABOLIC PANEL
BUN: 27 mg/dL — ABNORMAL HIGH (ref 6–23)
CO2: 28 mEq/L (ref 19–32)
Calcium: 9 mg/dL (ref 8.4–10.5)
Chloride: 103 mEq/L (ref 96–112)
Creat: 1.04 mg/dL (ref 0.50–1.10)
Glucose, Bld: 243 mg/dL — ABNORMAL HIGH (ref 70–99)
Potassium: 3.8 mEq/L (ref 3.5–5.3)
Sodium: 140 mEq/L (ref 135–145)

## 2015-02-15 LAB — POCT GLYCOSYLATED HEMOGLOBIN (HGB A1C): Hemoglobin A1C: 8.6

## 2015-02-15 NOTE — Progress Notes (Signed)
Kaitlyn Ferguson is a 69 y.o. female who presents today for DM, HTN, and HLD.  DM II - Fasting sugars have been borderline controlled (130-190) in the AM.  She continues to be compliant with her Lantus (66 U currently) and Vyctoza (1.8) daily, and denies any hypoglycemic events including any nausea, dizziness, blurred vision, diplopia, nightmares.  HTN - At goal, on HCTZ/Lisinopril 20/12.5 mg w/o SE.  As well on Norvasc 10 mg qhs w/o SE.  She is compliant with this medication and she denies any lightheadedness, dizziness, blurred vision, edema, palpitations.   Hyperlipidemia - Compliant with crestor, denies myalgias.   Past Medical History  Diagnosis Date  . Arthritis   . Diabetes mellitus   . Cholelithiasis   . Hyperlipidemia   . Hypothyroidism   . Obesity   . DJD (degenerative joint disease) 10/07/2008  . Osteomyelitis of ankle     Complications of Right ankle injury  . DJD (degenerative joint disease)   . Hypertension   . Neuropathy     History  Smoking status  . Never Smoker   Smokeless tobacco  . Never Used    Comment: No tobacco currently    Family History  Problem Relation Age of Onset  . Diabetes Brother   . Diabetes Sister     DM2-lost weight-now okay  . Heart attack Father     First MI- early 27's  . Stroke Mother   . Heart failure Mother   . Multiple myeloma Mother     Deceased at 68 due to same    Current Outpatient Prescriptions on File Prior to Visit  Medication Sig Dispense Refill  . ACCU-CHEK AVIVA PLUS test strip     . acetaminophen (TYLENOL) 650 MG CR tablet Take 1,300 mg by mouth 1 day or 1 dose. For arthritis.    Marland Kitchen amLODipine (NORVASC) 10 MG tablet take 1 tablet by mouth every evening 90 tablet 3  . aspirin 81 MG tablet Take 81 mg by mouth at bedtime.     . B-D ULTRA-FINE 33 LANCETS MISC Check BID.  Supplies sufficient for one month 100 each 11  . celecoxib (CELEBREX) 200 MG capsule Take 1 capsule (200 mg total) by mouth daily. 90 capsule 3   . Insulin Glargine (LANTUS) 100 UNIT/ML Solostar Pen Inject 55 Units into the skin every morning. Dispense 3 month supply 15 mL 11  . Insulin Pen Needle (B-D UF III MINI PEN NEEDLES) 31G X 5 MM MISC Dispense qt sufficient for two shots daily.  Supplies sufficient for one month. 100 each 11  . levothyroxine (SYNTHROID, LEVOTHROID) 112 MCG tablet take 1 tablet by mouth once daily 90 tablet 3  . Liraglutide 18 MG/3ML SOPN Inject 1.8 mg into the skin daily. 3 mL 11  . lisinopril-hydrochlorothiazide (PRINZIDE,ZESTORETIC) 20-12.5 MG per tablet take 2 tablets by mouth once daily 180 tablet 3  . Magnesium Oxide 250 MG TABS Take 2 tablets by mouth every morning.    Marland Kitchen omeprazole (PRILOSEC) 40 MG capsule Take 1 capsule (40 mg total) by mouth daily. 90 capsule 3  . rosuvastatin (CRESTOR) 20 MG tablet take 1 tablet by mouth once daily 30 tablet 11  . [DISCONTINUED] Insulin Syringe-Needle U-100 31G X 5/16" 1 ML MISC Use with insulin as directed. 100 each 11   No current facility-administered medications on file prior to visit.    ROS: Per HPI.  All other systems reviewed and are negative.   Physical Exam Filed Vitals:   02/15/15  0915  BP: 138/59  Pulse: 80  Temp: 98.6 F (37 C)    Physical Examination: General appearance - alert, well appearing, and in no distress Heart - normal rate and regular rhythm, no murmurs noted L Knee - + Valgus, + crepitus, Flexion 110-120, Extension 0-5, + medial/lateral joint line tenderness.    Lab Results  Component Value Date   HGBA1C 8.6 02/15/2015

## 2015-02-15 NOTE — Assessment & Plan Note (Signed)
Continues on Lantus 66 U Am/Victoza, good fasting AM control around 130 usually.  Denies any hypoglycemic episodes.  - Continue current regimen, increase to 66 U qam.  Directed to increase by 1-2 u qAM if > 150 for 2 consecutive days until <130 for 2 consecutive days.  Also if < 80 for 2 consecutive days to decrease by 1-2 U.   - A1C today  - Goal < 8 in elderly pt, would consider addition of metformin 500 BID at next visit if still elevated as renal fxn will allow this.

## 2015-02-15 NOTE — Assessment & Plan Note (Signed)
Continue Crestor 20 mg qd - Lipid Panel reviewed with pt.  LDL 56 and HDL slightly low at 34.  No current changes

## 2015-02-15 NOTE — Patient Instructions (Signed)
Please continue to go to the gym as tolerates.

## 2015-02-15 NOTE — Assessment & Plan Note (Signed)
BP well controlled overall - Continue regimen as prescribed - Check BMET for K+

## 2015-02-16 ENCOUNTER — Telehealth: Payer: Self-pay | Admitting: Family Medicine

## 2015-02-16 NOTE — Telephone Encounter (Signed)
D/W pt the result of her BMET.  K+ looks ok but BUN/Cr elevated most likely 2/2 dehydration.  Recommended increased fluid intake.  Thanks [employer]. Awanda Mink, DO of [facility] 02/16/2015, 8:35 AM

## 2015-02-28 ENCOUNTER — Encounter: Payer: Self-pay | Admitting: Family Medicine

## 2015-02-28 ENCOUNTER — Ambulatory Visit (INDEPENDENT_AMBULATORY_CARE_PROVIDER_SITE_OTHER): Payer: Commercial Managed Care - HMO | Admitting: Family Medicine

## 2015-02-28 VITALS — BP 152/90 | HR 62 | Temp 98.4°F | Ht 67.0 in | Wt 257.0 lb

## 2015-02-28 DIAGNOSIS — S161XXA Strain of muscle, fascia and tendon at neck level, initial encounter: Secondary | ICD-10-CM

## 2015-02-28 HISTORY — DX: Strain of muscle, fascia and tendon at neck level, initial encounter: S16.1XXA

## 2015-02-28 MED ORDER — BACLOFEN 10 MG PO TABS: 10.0000 mg | ORAL_TABLET | Freq: Two times a day (BID) | ORAL | Status: DC

## 2015-02-28 NOTE — Assessment & Plan Note (Addendum)
-   Prescription sent to pharmacy for Baclofen. Discussed safety and recommended taking only as prescribed. - Recommended scheduled NSAIDs. Recommended Salonpas patches for pain. - Follow up in two weeks if not improved. Consider cervical collar for support at that time.

## 2015-02-28 NOTE — Patient Instructions (Signed)
Thank you so much for coming to visit me today!  I have sent a prescription for Baclofen for the pharmacy. This is a muscle relaxer. Please do not drive while on this medication. Please only take as prescribed, 10mg  twice a day. I also recommend taking Aleve or Ibuprofen on a schedule over the next few days instead of only when needed. You may try Salonpas patches to help with the pain as well. Please try to rest over the next few days and try not to strain your neck. Please return for follow up if not improved in 2 weeks.  Thanks again! Dr. Gerlean Ren  Cervical Sprain A cervical sprain is an injury in the neck in which the strong, fibrous tissues (ligaments) that connect your neck bones stretch or tear. Cervical sprains can range from mild to severe. Severe cervical sprains can cause the neck vertebrae to be unstable. This can lead to damage of the spinal cord and can result in serious nervous system problems. The amount of time it takes for a cervical sprain to get better depends on the cause and extent of the injury. Most cervical sprains heal in 1 to 3 weeks. CAUSES  Severe cervical sprains may be caused by:   Contact sport injuries (such as from football, rugby, wrestling, hockey, auto racing, gymnastics, diving, martial arts, or boxing).   Motor vehicle collisions.   Whiplash injuries. This is an injury from a sudden forward and backward whipping movement of the head and neck.  Falls.  Mild cervical sprains may be caused by:   Being in an awkward position, such as while cradling a telephone between your ear and shoulder.   Sitting in a chair that does not offer proper support.   Working at a poorly Landscape [employer] station.   Looking up or down for long periods of time.  SYMPTOMS   Pain, soreness, stiffness, or a burning sensation in the front, back, or sides of the neck. This discomfort may develop immediately after the injury or slowly, 24 hours or more after the injury.    Pain or tenderness directly in the middle of the back of the neck.   Shoulder or upper back pain.   Limited ability to move the neck.   Headache.   Dizziness.   Weakness, numbness, or tingling in the hands or arms.   Muscle spasms.   Difficulty swallowing or chewing.   Tenderness and swelling of the neck.  DIAGNOSIS  Most of the time your health care provider can diagnose a cervical sprain by taking your history and doing a physical exam. Your health care provider will ask about previous neck injuries and any known neck problems, such as arthritis in the neck. X-rays may be taken to find out if there are any other problems, such as with the bones of the neck. Other tests, such as a CT scan or MRI, may also be needed.  TREATMENT  Treatment depends on the severity of the cervical sprain. Mild sprains can be treated with rest, keeping the neck in place (immobilization), and pain medicines. Severe cervical sprains are immediately immobilized. Further treatment is done to help with pain, muscle spasms, and other symptoms and may include:  Medicines, such as pain relievers, numbing medicines, or muscle relaxants.   Physical therapy. This may involve stretching exercises, strengthening exercises, and posture training. Exercises and improved posture can help stabilize the neck, strengthen muscles, and help stop symptoms from returning.  HOME CARE INSTRUCTIONS   Put ice on  the injured area.   Put ice in a plastic bag.   Place a towel between your skin and the bag.   Leave the ice on for 15-20 minutes, 3-4 times a day.   If your injury was severe, you may have been given a cervical collar to wear. A cervical collar is a two-piece collar designed to keep your neck from moving while it heals.  Do not remove the collar unless instructed by your health care provider.  If you have long hair, keep it outside of the collar.  Ask your health care provider before making any  adjustments to your collar. Minor adjustments may be required over time to improve comfort and reduce pressure on your chin or on the back of your head.  Ifyou are allowed to remove the collar for cleaning or bathing, follow your health care provider's instructions on how to do so safely.  Keep your collar clean by wiping it with mild soap and water and drying it completely. If the collar you have been given includes removable pads, remove them every 1-2 days and hand wash them with soap and water. Allow them to air dry. They should be completely dry before you wear them in the collar.  If you are allowed to remove the collar for cleaning and bathing, wash and dry the skin of your neck. Check your skin for irritation or sores. If you see any, tell your health care provider.  Do not drive while wearing the collar.   Only take over-the-counter or prescription medicines for pain, discomfort, or fever as directed by your health care provider.   Keep all follow-up appointments as directed by your health care provider.   Keep all physical therapy appointments as directed by your health care provider.   Make any needed adjustments to your workstation to promote good posture.   Avoid positions and activities that make your symptoms worse.   Warm up and stretch before being active to help prevent problems.  SEEK MEDICAL CARE IF:   Your pain is not controlled with medicine.   You are unable to decrease your pain medicine over time as planned.   Your activity level is not improving as expected.  SEEK IMMEDIATE MEDICAL CARE IF:   You develop any bleeding.  You develop stomach upset.  You have signs of an allergic reaction to your medicine.   Your symptoms get worse.   You develop new, unexplained symptoms.   You have numbness, tingling, weakness, or paralysis in any part of your body.  MAKE SURE YOU:   Understand these instructions.  Will watch your condition.  Will  get help right away if you are not doing well or get worse. Document Released: 06/09/2007 Document Revised: 08/17/2013 Document Reviewed: 02/17/2013 [facility] Patient Information 2015 [place], [place]. This information is not intended to replace advice given to you by your health care provider. Make sure you discuss any questions you have with your health care provider.

## 2015-02-28 NOTE — Progress Notes (Signed)
Subjective:     Patient ID: Jamse Mead, female   DOB: 1946/02/07, 69 y.o.   MRN: [id number]  HPI Mrs. Polgar is a 69yo female presenting today for acute neck pain. - Has been present since Thursday, 02/23/15 - Located along trapezius on right side of neck - Never completely resolved, but has acute peaks and then improves to baseline pain - Does not recall any injuries, but did work out at gym on machines and with weights day symptoms started - Has started affecting quality of sleep - Has tried tylenol, heat (helped some), cold, pain gel - Right arm numb occasionally - No further complaints  Review of Systems  Musculoskeletal: Positive for myalgias and neck pain.       Objective:   Physical Exam  Constitutional: She appears well-developed and well-nourished. No distress.  Cardiovascular: Normal rate and regular rhythm.  Exam reveals no gallop and no friction rub.   No murmur heard. Pulmonary/Chest: No respiratory distress. She has no wheezes.  Abdominal: Soft. She exhibits no distension. There is no tenderness.  Musculoskeletal:  Tenderness along right trapezius, tight musculature along right neck noted, no midline tenderness, negative spurlings      Assessment:     Please refer to Problem List for Assessment.    Plan:     Please refer to Problem List for Plan.

## 2015-03-28 ENCOUNTER — Telehealth: Payer: Self-pay | Admitting: Family Medicine

## 2015-03-28 DIAGNOSIS — E119 Type 2 diabetes mellitus without complications: Secondary | ICD-10-CM

## 2015-03-28 MED ORDER — INSULIN GLARGINE 100 UNIT/ML SOLOSTAR PEN: 64.0000 [IU] | PEN_INJECTOR | SUBCUTANEOUS | Status: DC

## 2015-03-28 NOTE — Telephone Encounter (Signed)
Pt calling because Rx for Lantus was written for 55 units although she is taking 64 units per day (states that previous PCP, Dr. Awanda Mink, was aware of this). Rx is unable to be refilled until 8/8. Would like to be given enough of this insulin to last until then. Thank you, Fonda Kinder, ASA

## 2015-04-13 ENCOUNTER — Encounter: Payer: Self-pay | Admitting: Pharmacist

## 2015-04-13 ENCOUNTER — Ambulatory Visit (INDEPENDENT_AMBULATORY_CARE_PROVIDER_SITE_OTHER): Payer: Commercial Managed Care - HMO | Admitting: Pharmacist

## 2015-04-13 DIAGNOSIS — E119 Type 2 diabetes mellitus without complications: Secondary | ICD-10-CM | POA: Diagnosis not present

## 2015-04-13 DIAGNOSIS — I10 Essential (primary) hypertension: Secondary | ICD-10-CM | POA: Diagnosis not present

## 2015-04-13 MED ORDER — METFORMIN HCL 500 MG PO TABS: 500.0000 mg | ORAL_TABLET | Freq: Two times a day (BID) | ORAL | Status: DC

## 2015-04-13 MED ORDER — SPIRONOLACTONE 25 MG PO TABS: 12.5000 mg | ORAL_TABLET | Freq: Every day | ORAL | Status: DC

## 2015-04-13 NOTE — Patient Instructions (Addendum)
Thanks for coming today!  Start taking metformin 500mg  once daily with meals. If not causing any stomach issues, increase to twice daily with meals. Keep monitoring your blood sugar and get a few readings at random times during the day. Bring results to your next visit with [place].   Start taking spironolactone 12.5mg  daily. You will be splitting the tablet and taking half each day.  Continue to limit sugar intake, be mindful of portion sizes at meals, and continue to increase activity.   Follow up at pharmacy clinic in about 3 weeks.

## 2015-04-13 NOTE — Assessment & Plan Note (Signed)
Uncontrolled BP (today 147/67) on amlodipine 10mg  daily and lisinopril/HCTZ 20/12.5mg  daily. Complaining of cramping with potassium hovering relatively low on recent BMPs (<4.0). Start spironolactone 25mg  one-half tablet daily. Will follow up at next visit to see if cramping improves.  Recheck BET at next visit.

## 2015-04-13 NOTE — Progress Notes (Signed)
S:    Patient arrives in a pleasant mood for diabetes follow-up.  Patient reports having history of Diabetes since the year of 2010.   Patient reports adherence with medications. Current diabetes medications include lantus 64 units daily and liraglutide 1.8mg  daily  Patient denies hypoglycemic events.  Patient reported dietary habits: "my eating is crazy." Patient states she has reduced her sugar intake by using sugar substitutes. Admits having difficulty giving up sweets. Has also limited the amount of fried foods she is eating.   Patient reported exercise habits: Has remained somewhat active but, has some trouble with her knee limiting activity. Also reports having cramps for an extended period of time further limiting her motivation to exercise.   Patient denies nocturia. Patient denies neuropathy. Patient denies visual changes. Reports having cramps for an extended period of time   O:  . Lab Results  Component Value Date   HGBA1C 8.6 02/15/2015     Home fasting CBG ranges 140-220. Also generally checks in the evening with similar values.   A/P: Diabetes diagnosed in 2010 currently uncontrolled. Denies hypoglycemic events and is able to verbalize appropriate hypoglycemia management plan.  reports adherence with medication. Control is suboptimal due to poor diet/inadequate activity level. Patient could likely benefit from metformin and has no contraindications to therapy.Start metformin 500mg  once daily with meals. If intolerance not an issue, increase to twice daily with meals in 5-7 days. No changes to basal insulin at this time. Monitor blood glucose as normal but try to get some values at random times throughout the day. Continue to limit sugar intake, be mindful of portion sizes, and increase activity level.  Additionally, patient has uncontrolled BP (today 147/67) on amlodipine 10mg  daily and lisinopril/HCTZ 20/12.5mg  daily. Complaining of cramping with potassium hovering  relatively low on recent BMPs (<4.0). Start spironolactone 25mg  one-half tablet daily. Will follow up at next visit to see if cramping improves.  Next A1C anticipated 04/2015.  Written patient instructions provided.  Follow up in Pharmacist Clinic Visit in 3 weeks.   Total time in face to face counseling 30 minutes.  Patient seen with Stephens November, PharmD Resident and Wendee Beavers MD, Medical Resident. Marland Kitchen

## 2015-04-13 NOTE — Progress Notes (Signed)
   Subjective:    Patient ID: Kaitlyn Ferguson, female    DOB: [birth year], 69 y.o.   MRN: [id number]  HPI Reviewed: Agree with Dr. Graylin Shiver documentation and management.    Review of Systems     Objective:   Physical Exam        Assessment & Plan:

## 2015-04-13 NOTE — Assessment & Plan Note (Signed)
Diabetes diagnosed in 2010 currently uncontrolled. Denies hypoglycemic events and is able to verbalize appropriate hypoglycemia management plan.  reports adherence with medication. Control is suboptimal due to poor diet/inadequate activity level. Patient could likely benefit from metformin and has no contraindications to therapy.Start metformin 500mg  once daily with meals. If intolerance not an issue, increase to twice daily with meals in 5-7 days. No changes to basal insulin at this time. Monitor blood glucose as normal but try to get some values at random times throughout the day. Continue to limit sugar intake, be mindful of portion sizes, and increase activity level.

## 2015-04-17 ENCOUNTER — Other Ambulatory Visit: Payer: Self-pay

## 2015-04-17 DIAGNOSIS — Z1231 Encounter for screening mammogram for malignant neoplasm of breast: Secondary | ICD-10-CM

## 2015-04-24 ENCOUNTER — Other Ambulatory Visit: Payer: Self-pay | Admitting: Family Medicine

## 2015-04-24 MED ORDER — LIRAGLUTIDE 18 MG/3ML ~~LOC~~ SOPN: 1.8000 mg | PEN_INJECTOR | Freq: Every day | SUBCUTANEOUS | Status: DC

## 2015-04-24 NOTE — Telephone Encounter (Signed)
Spoke with patient about needing diabetic supplies but she only needs a refill on her victoza.   Rosemarie Ax, MD PGY-3, [facility] 04/24/2015, 4:38 PM

## 2015-05-16 ENCOUNTER — Ambulatory Visit (INDEPENDENT_AMBULATORY_CARE_PROVIDER_SITE_OTHER): Payer: Commercial Managed Care - HMO | Admitting: Family Medicine

## 2015-05-16 ENCOUNTER — Encounter: Payer: Self-pay | Admitting: Pharmacist

## 2015-05-16 ENCOUNTER — Ambulatory Visit (INDEPENDENT_AMBULATORY_CARE_PROVIDER_SITE_OTHER): Payer: Commercial Managed Care - HMO | Admitting: Pharmacist

## 2015-05-16 VITALS — BP 139/61 | HR 83 | Temp 98.8°F | Ht 68.0 in | Wt 246.0 lb

## 2015-05-16 DIAGNOSIS — E039 Hypothyroidism, unspecified: Secondary | ICD-10-CM

## 2015-05-16 DIAGNOSIS — Z23 Encounter for immunization: Secondary | ICD-10-CM

## 2015-05-16 DIAGNOSIS — Z1159 Encounter for screening for other viral diseases: Secondary | ICD-10-CM | POA: Diagnosis not present

## 2015-05-16 DIAGNOSIS — E119 Type 2 diabetes mellitus without complications: Secondary | ICD-10-CM

## 2015-05-16 DIAGNOSIS — R42 Dizziness and giddiness: Secondary | ICD-10-CM

## 2015-05-16 DIAGNOSIS — I10 Essential (primary) hypertension: Secondary | ICD-10-CM

## 2015-05-16 LAB — POCT GLYCOSYLATED HEMOGLOBIN (HGB A1C): Hemoglobin A1C: 8

## 2015-05-16 LAB — BASIC METABOLIC PANEL
BUN: 26 mg/dL — ABNORMAL HIGH (ref 7–25)
CO2: 28 mmol/L (ref 20–31)
Calcium: 9.8 mg/dL (ref 8.6–10.4)
Chloride: 100 mmol/L (ref 98–110)
Creat: 1.02 mg/dL — ABNORMAL HIGH (ref 0.50–0.99)
Glucose, Bld: 87 mg/dL (ref 65–99)
Potassium: 3.8 mmol/L (ref 3.5–5.3)
Sodium: 141 mmol/L (ref 135–146)

## 2015-05-16 LAB — TSH: TSH: 0.994 u[IU]/mL (ref 0.350–4.500)

## 2015-05-16 MED ORDER — METFORMIN HCL 1000 MG PO TABS: 1000.0000 mg | ORAL_TABLET | Freq: Two times a day (BID) | ORAL | Status: DC

## 2015-05-16 NOTE — Assessment & Plan Note (Signed)
Hypertension: blood pressure at goal today in clinic; however patient reports some dizziness at home.  Will do a 24-hour blood pressure monitor.  Will check BMET today.

## 2015-05-16 NOTE — Progress Notes (Signed)
S:    Patient arrives in good spirits.  Presents for diabetes follow up.   Patient reports having history of Diabetes since the year of 2010.   Patient reports adherence with medications. Current diabetes medications include Lantus 62 units QAM, Victoza 1.8mg  daily, metformin 500mg  BID.   Patient denies hypoglycemic events.  Patient reports an intentional 12 lb weight loss.  She reports increased exercise and is taking classes at the [facility].   Current hypertension medications include amlodipine 10mg  daily, lisinopril/HCTZ 20/12.5mg  daily, spironolactone 12.5mg  daily.  Patient reports experiencing some dizziness recently.  Patient reports cramping has improved after the initiation of spironolactone at the last visit.     O:  . Lab Results  Component Value Date   HGBA1C 8.0 05/16/2015    Home fasting CBG: 160-170s  A/P: Diabetes diagnosed in 2010 currently improved.   Denies hypoglycemic events.  Reports adherence with medication. Increase metformin to 1000mg  BID. Continued basal insulin Lantus (insulin glargine) 62 units QAM.  Continued Victoza (liraglutide) to 1.8mg  daily.  If blood glucose drops below 100mg /dL with the increase in metformin, patient was instructed to decrease lantus to 52 units daily.   Hypertension: blood pressure at goal today in clinic; however patient reports some dizziness at home. Scheduled a 24-hour blood pressure monitor evaluation.  Will check BMET today.    Written patient instructions provided.  Follow up in Pharmacist Clinic Visit on 05/29/15 for blood pressure monitor.   Total time in face to face counseling 30 minutes.  Patient seen with Roxy Manns, PharmD Candidate and Elisabeth Most, PharmD, resident.

## 2015-05-16 NOTE — Patient Instructions (Signed)
Increase metformin to 1000mg  (2 tablets) twice daily.  If blood sugar drops to less than 100, decrease Lantus to 52 units.    Blood pressure monitor on Monday, October 3rd.

## 2015-05-16 NOTE — Assessment & Plan Note (Signed)
Most likely related to orthostatic change. Possible for vertigo  No edema, SOB or crackles on exam  - advised for 24 hour BP monitoring with Dr. Valentina Lucks

## 2015-05-16 NOTE — Assessment & Plan Note (Addendum)
Diabetes diagnosed in 2010 currently improved.   Denies hypoglycemic events.  Reports adherence with medication. Increase metformin to 1000mg  BID. Continued basal insulin Lantus (insulin glargine) 62 units QAM.  Continued Victoza (liraglutide) to 1.8mg  daily.  If blood glucose drops below 100mg /dL with the increase in metformin, patient was instructed to decrease lantus to 52 units daily.

## 2015-05-16 NOTE — Assessment & Plan Note (Signed)
Stable.  TSH today. °

## 2015-05-16 NOTE — Patient Instructions (Signed)
Thank you for coming in,   Ask Dr. Valentina Lucks to place a 24 hour blood pressure monitoring.   Please bring all of your medications with you to each visit.   Sign up for My Chart to have easy access to your labs results, and communication with your Primary care physician   Please feel free to call with any questions or concerns at any time, at [phone]. --Dr. Raeford Razor  Diet Recommendations for Diabetes   Starchy (carb) foods include: Bread, rice, pasta, potatoes, corn, crackers, bagels, muffins, all baked goods.   Protein foods include: Meat, fish, poultry, eggs, dairy foods, and beans such as pinto and kidney beans (beans also provide carbohydrate).   1. Eat at least 3 meals and 1-2 snacks per day. Never go more than 4-5 hours while awake without eating.  2. Limit starchy foods to TWO per meal and ONE per snack. ONE portion of a starchy  food is equal to the following:   - ONE slice of bread (or its equivalent, such as half of a hamburger bun).   - 1/2 cup of a "scoopable" starchy food such as potatoes or rice.   - 1 OUNCE (28 grams) of starchy snack foods such as crackers or pretzels (look on label).   - 15 grams of carbohydrate as shown on food label.  3. Both lunch and dinner should include a protein food, a carb food, and vegetables.   - Obtain twice as many veg's as protein or carbohydrate foods for both lunch and dinner.   - Try to keep frozen veg's on hand for a quick vegetable serving.     - Fresh or frozen veg's are best.  4. Breakfast should always include protein.

## 2015-05-16 NOTE — Assessment & Plan Note (Signed)
Improvement in A1c.  optha appt next month  - f/u with Dr. Valentina Lucks after this visit  - f/u in three months.

## 2015-05-16 NOTE — Progress Notes (Signed)
Subjective:    Kaitlyn Ferguson - 69 y.o. female MRN [id number]  Date of birth: 1946-06-22  HPI  Kaitlyn Ferguson is here for Dm2 and dizziness.  CHRONIC DIABETES  Disease Monitoring  Blood Sugar Ranges: 122-227  Polyuria: no   Visual problems: no   Medication Compliance: yes  Medication Side Effects  Hypoglycemia: no   Preventitive Health Care  Eye Exam: Oct 6   Foot Exam: 2015  Diet pattern: staying away potatoes chips and sweets   Exercise: goes to the [employer]. 4 days per week   DIZZINESS  She feels like room is spinning.  Has started for the last week  Getting worse. Everytime she stands up she feels like she is going to lose her balance  Resolves when she stands there. Lasts for 1 minute  Feels like room spins: yes  Lightheadedness when stands: yes  Palpitations or heart racing: no Prior dizziness: yes. Resolved on its own  Medications tried: no  Taking blood thinners: no  Symptoms Hearing Loss: no Ear Pain or fullness: no Nausea or vomiting: no Vision difficulty or double vision: no Falls: no Head trauma: no Weakness in arm or leg: no Speaking problems: no Headache: no  Hypothyroidism:  Synthroid and is compliant  Takes it in the AM with all of her medications and then eats her breakfast  She sweats a lot  Doesn't report any changes recently.   Health Maintenance:  Health Maintenance Due  Topic Date Due  . Hepatitis C Screening  03-Jul-1946  . PNA vac Low Risk Adult (1 of 2 - PCV13) 04/07/2011  . OPHTHALMOLOGY EXAM  12/25/2014  . INFLUENZA VACCINE  03/27/2015    -  reports that she has never smoked. She has never used smokeless tobacco. - Review of Systems: Per HPI. - Past Medical History: Patient Active Problem List   Diagnosis Date Noted  . Dizziness 05/16/2015  . Cervical strain 02/28/2015  . Diabetes mellitus without complication 82/99/3716  . Hyperlipidemia 03/18/2007  . Hypothyroidism 10/23/2006  . OBESITY, NOS 10/23/2006  .  HYPERTENSION, BENIGN SYSTEMIC 10/23/2006  . ARTHRITIS, DEGENERATIVE 10/23/2006   - Medications: reviewed and updated Current Outpatient Prescriptions  Medication Sig Dispense Refill  . ACCU-CHEK AVIVA PLUS test strip     . acetaminophen (TYLENOL) 650 MG CR tablet Take 1,300 mg by mouth 1 day or 1 dose. For arthritis.    Marland Kitchen amLODipine (NORVASC) 10 MG tablet take 1 tablet by mouth every evening 90 tablet 3  . aspirin 81 MG tablet Take 81 mg by mouth at bedtime.     . B-D ULTRA-FINE 33 LANCETS MISC Check BID.  Supplies sufficient for one month 100 each 11  . Insulin Glargine (LANTUS) 100 UNIT/ML Solostar Pen Inject 64 Units into the skin every morning. Dispense 3 month supply 15 mL 1  . Insulin Pen Needle (B-D UF III MINI PEN NEEDLES) 31G X 5 MM MISC Dispense qt sufficient for two shots daily.  Supplies sufficient for one month. 100 each 11  . levothyroxine (SYNTHROID, LEVOTHROID) 112 MCG tablet take 1 tablet by mouth once daily 90 tablet 3  . Liraglutide 18 MG/3ML SOPN Inject 0.3 mLs (1.8 mg total) into the skin daily. 3 mL 11  . lisinopril-hydrochlorothiazide (PRINZIDE,ZESTORETIC) 20-12.5 MG per tablet take 2 tablets by mouth once daily (Patient taking differently: take 1 tablets by mouth once daily) 180 tablet 3  . Magnesium Oxide 250 MG TABS Take 2 tablets by mouth every morning.    Marland Kitchen  omeprazole (PRILOSEC) 40 MG capsule Take 1 capsule (40 mg total) by mouth daily. 90 capsule 3  . rosuvastatin (CRESTOR) 20 MG tablet take 1 tablet by mouth once daily 30 tablet 11  . spironolactone (ALDACTONE) 25 MG tablet Take 0.5 tablets (12.5 mg total) by mouth daily. 30 tablet 1  . metFORMIN (GLUCOPHAGE) 1000 MG tablet Take 1 tablet (1,000 mg total) by mouth 2 (two) times daily with a meal. 180 tablet 3  . [DISCONTINUED] Insulin Syringe-Needle U-100 31G X 5/16" 1 ML MISC Use with insulin as directed. 100 each 11   No current facility-administered medications for this visit.     Review of Systems See HPI       Objective:   Physical Exam BP 139/61 mmHg  Pulse 83  Temp(Src) 98.8 F (37.1 C) (Oral)  Ht 5\' 8"  (1.727 m)  Wt 246 lb (111.585 kg)  BMI 37.41 kg/m2 Gen: NAD, alert, cooperative with exam, well-appearing HEENT:  No goiter, neck supple  CV: RRR, good S1/S2, no murmur,  Resp: CTABL, no wheezes, non-labored Skin: no rashes, normal turgor  Neuro: no gross deficits.      Assessment & Plan:   Hypothyroidism Stable.  - TSH today    Diabetes mellitus without complication Improvement in A1c.  optha appt next month  - f/u with Dr. Valentina Lucks after this visit  - f/u in three months.   Dizziness Most likely related to orthostatic change. Possible for vertigo  No edema, SOB or crackles on exam  - advised for 24 hour BP monitoring with Dr. Valentina Lucks

## 2015-05-17 ENCOUNTER — Ambulatory Visit
Admission: RE | Admit: 2015-05-17 | Discharge: 2015-05-17 | Disposition: A | Discharge status: Home / Self Care | Payer: Commercial Managed Care - HMO | Source: Ambulatory Visit

## 2015-05-17 ENCOUNTER — Encounter: Payer: Self-pay | Admitting: Family Medicine

## 2015-05-17 DIAGNOSIS — Z1231 Encounter for screening mammogram for malignant neoplasm of breast: Secondary | ICD-10-CM

## 2015-05-17 LAB — HEPATITIS C ANTIBODY: HCV Ab: NEGATIVE

## 2015-05-17 NOTE — Progress Notes (Signed)
Patient ID: Kaitlyn Ferguson, female   DOB: November 05, 1945, 69 y.o.   MRN: [id number] Reviewed: Agree with Dr. Graylin Shiver documentation and management.

## 2015-05-29 ENCOUNTER — Encounter: Payer: Self-pay | Admitting: Pharmacist

## 2015-05-29 ENCOUNTER — Ambulatory Visit (INDEPENDENT_AMBULATORY_CARE_PROVIDER_SITE_OTHER): Payer: Commercial Managed Care - HMO | Admitting: Pharmacist

## 2015-05-29 DIAGNOSIS — I10 Essential (primary) hypertension: Secondary | ICD-10-CM

## 2015-05-29 DIAGNOSIS — E119 Type 2 diabetes mellitus without complications: Secondary | ICD-10-CM | POA: Diagnosis not present

## 2015-05-29 NOTE — Patient Instructions (Addendum)
Decrease your amlodipine to 5mg  once daily in the evening.  (Cut the 10mg  talbets in half).   Take Metformin 500mg  tablets one each morning and Take the new Metformin 750mg  XR tablet once daily in the evening.   Return to office if you continue to have dizziness or if you continue to have nausea.

## 2015-05-30 ENCOUNTER — Encounter: Payer: Self-pay | Admitting: Pharmacist

## 2015-05-30 MED ORDER — METFORMIN HCL ER 750 MG PO TB24: 750.0000 mg | ORAL_TABLET | Freq: Every day | ORAL | Status: DC

## 2015-05-30 NOTE — Assessment & Plan Note (Signed)
Diabetes longstanding with good control however, she is reporting increased nausea with use of higher dose (1000mg  BID) of metformin.  Has tolerated 500mg  BID in the past without and GI symptoms. As patient has recently filled her 500mg  prescription we will attempt to keep the 500mg  dose once daily and add a 750mg  ER dose with her evening meal.   Will likely attempt 750mg  ER twice daily in the future if this lower dose is tolerated. Reassess next visit.

## 2015-05-30 NOTE — Progress Notes (Signed)
Patient ID: Kaitlyn Ferguson, female   DOB: 08/13/1946, 69 y.o.   MRN: [id number] Reviewed: Agree with Dr. Graylin Shiver documentation and management.

## 2015-05-30 NOTE — Assessment & Plan Note (Signed)
History of hypertension for several years with elevated readings in office prior to staring spironolactone.  Since starting spironolactone for leg cramps and elevated blood pressure patient has been reporting dizziness.   Given 24-hour ambulatory blood pressure demonstrating low readings  with an average blood pressure of 113/58 mmHg, and a nocturnal dipping pattern that is normal we will attempt to reduce amlodipine.  Changes to medications reduce amlodipine from 10mg  to 5mg  QPM (she will split the 10mg  tablets).  Consider stopping or reducing Thiazide dose next.

## 2015-05-30 NOTE — Progress Notes (Signed)
S:    Patient arrives in good spirits, walking without assistance.    Presents to the clinic for ambulatory blood pressure evaluation.   Medication compliance is reported to be excellent.  Reports improvement in leg cramping with spironolactone however also reports some late day leg swelling.   Discussed procedure for wearing the monitor and gave patient written instructions. Monitor was placed on non-dominant arm with instructions to return in the morning.   Current BP Medications include:  Amlodipine 10mg , Lisinopril 20/12.5mg  tablets (takes 2 daily), and spironolactone 25mg .   Patient returned to the clinic and reported no issues with monitor.  States she is happy to take it off.   Patient notes intolerance - increased GI symptoms of nausea with higher dose of Metformin 1000mg  BID.   Willing to continue on Metformin at lower dose.  Patient has reduced her dose of Lantus from 64 to 56 units.   She states her blood sugars have been under excellent control.    O:   Last 3 Office BP readings: BP Readings from Last 3 Encounters:  05/29/15 113/59  05/16/15 132/57  05/16/15 139/61    ABPM Study Data: Arm Placement left arm   Overall Mean 24hr BP:   113/58 mmHg HR: 73   Daytime Mean BP:  117/60 mmHg HR: 75   Nighttime Mean BP:  103/46mmHg HR: 67   Dipping Pattern: Yes.    Sys:   11.9%   Dia: 13.1%   [normal dipping ~10-20%]   Non-hypertensive ABPM thresholds: daytime BP <135/85 mmHg, sleeptime BP <120/70 mmHg NICE Hypertension Guidelines ([place]) using ABPM: Stage I: >135/85 mmHg, Stage 2: >150/95 mmHg)   BMET    Component Value Date/Time   NA 141 05/16/2015 1626   K 3.8 05/16/2015 1626   CL 100 05/16/2015 1626   CO2 28 05/16/2015 1626   GLUCOSE 87 05/16/2015 1626   BUN 26* 05/16/2015 1626   CREATININE 1.02* 05/16/2015 1626   CREATININE 1.03 08/24/2012 1048   CALCIUM 9.8 05/16/2015 1626   GFRNONAA 55* 08/24/2012 1048   GFRAA 64* 08/24/2012 1048    A/P: History of  hypertension for several years with elevated readings in office prior to staring spironolactone.  Since starting spironolactone for leg cramps and elevated blood pressure patient has been reporting dizziness.   Given 24-hour ambulatory blood pressure demonstrating low readings  with an average blood pressure of 113/58 mmHg, and a nocturnal dipping pattern that is normal we will attempt to reduce amlodipine.  Changes to medications reduce amlodipine from 10mg  to 5mg  QPM (she will split the 10mg  tablets).  Consider stopping or reducing Thiazide dose next.      Diabetes longstanding with good control however, she is reporting increased nausea with use of higher dose (1000mg  BID) of metformin.  Has tolerated 500mg  BID in the past without and GI symptoms. As patient has recently filled her 500mg  prescription we will attempt to keep the 500mg  dose once daily and add a 750mg  ER dose with her evening meal.   Will likely attempt 750mg  ER twice daily in the future if this lower dose is tolerated. Reassess next visit.   Results reviewed and written information provided.  Total time in face-to-face counseling 30 minutes combined days.   F/U Clinic Visit with Dr. Valentina Lucks if dizziness persists in the next 2-3 weeks.  Patient seen with Viann Fish, PharmD Candidate, and Dimitri Ped, PharmD Resident.

## 2015-06-06 ENCOUNTER — Other Ambulatory Visit: Payer: Self-pay | Admitting: *Deleted

## 2015-06-07 MED ORDER — LEVOTHYROXINE SODIUM 112 MCG PO TABS: ORAL_TABLET | ORAL | Status: DC

## 2015-06-22 ENCOUNTER — Other Ambulatory Visit: Payer: Self-pay | Admitting: *Deleted

## 2015-06-22 MED ORDER — LIRAGLUTIDE 18 MG/3ML ~~LOC~~ SOPN: 1.8000 mg | PEN_INJECTOR | Freq: Every day | SUBCUTANEOUS | Status: DC

## 2015-07-27 ENCOUNTER — Other Ambulatory Visit: Payer: Self-pay | Admitting: Family Medicine

## 2015-08-10 ENCOUNTER — Encounter: Payer: Self-pay | Admitting: Family Medicine

## 2015-08-10 ENCOUNTER — Ambulatory Visit (INDEPENDENT_AMBULATORY_CARE_PROVIDER_SITE_OTHER): Payer: Commercial Managed Care - HMO | Admitting: Family Medicine

## 2015-08-10 VITALS — BP 118/66 | HR 85 | Temp 98.0°F | Ht 67.0 in | Wt 245.7 lb

## 2015-08-10 DIAGNOSIS — M25511 Pain in right shoulder: Secondary | ICD-10-CM | POA: Diagnosis not present

## 2015-08-10 DIAGNOSIS — M25562 Pain in left knee: Secondary | ICD-10-CM

## 2015-08-10 MED ORDER — DICLOFENAC SODIUM 75 MG PO TBEC: 75.0000 mg | DELAYED_RELEASE_TABLET | Freq: Two times a day (BID) | ORAL | Status: DC

## 2015-08-10 NOTE — Progress Notes (Signed)
Subjective:    Kaitlyn Ferguson - 69 y.o. female MRN [id number]  Date of birth: 09-Sep-1945  HPI  Kaitlyn Ferguson is here for shoulder and knee pain.  Right shoulder pain  Pain on the lateral right shoulder  Worse in the last week.  Has not been doing anything different in terms of lifting.  She does an aerobic class as the [employer] and hasn't changed the regimen.  Hasn't received an injection into that shoulder.  Pain is localized.  Has never hurt her shoulder  No numbness or tingling distally  Worse with abduction  No prior imaging  Would have to pay for water aerobics.  Denies any pain in her neck.   Left knee pain:  Has received an injection in June with no relief.  Imaging from 2015 showing degenerative changes.  Has hurt for years.  Hurts the most on the anterior aspect  Pain is constant and worse with activity.  Has tried icy hot and tylenol with some relief.  She use to take tylenol arthritis.  No injury and no prior surgery  No locking or giving way  No numbness or tingling  Swells up from time to time.  Pain is localized and is severe and sharp in nature. Worse when she is sitting for a while and then has to stand.  Has never tried physical therapy.   Health Maintenance:  - no PNA shot today  Health Maintenance Due  Topic Date Due  . PNA vac Low Risk Adult (1 of 2 - PCV13) 04/07/2011  . OPHTHALMOLOGY EXAM  12/25/2014  . FOOT EXAM  06/02/2015    -  reports that she has never smoked. She has never used smokeless tobacco. - Review of Systems: Per HPI. - Past Medical History: Patient Active Problem List   Diagnosis Date Noted  . Left knee pain 08/10/2015  . Right shoulder pain 08/10/2015  . Dizziness 05/16/2015  . Cervical strain 02/28/2015  . Diabetes mellitus without complication ([place]) [id number]  . Hyperlipidemia 03/18/2007  . Hypothyroidism 10/23/2006  . OBESITY, NOS 10/23/2006  . HYPERTENSION, BENIGN SYSTEMIC 10/23/2006  . ARTHRITIS,  DEGENERATIVE 10/23/2006   - Medications: reviewed and updated Current Outpatient Prescriptions  Medication Sig Dispense Refill  . ACCU-CHEK AVIVA PLUS test strip     . acetaminophen (TYLENOL) 650 MG CR tablet Take 1,300 mg by mouth 1 day or 1 dose. For arthritis.    Marland Kitchen amLODipine (NORVASC) 10 MG tablet Take 0.5 tablets (5 mg total) by mouth every evening.    Marland Kitchen aspirin 81 MG tablet Take 81 mg by mouth at bedtime.     . B-D ULTRA-FINE 33 LANCETS MISC Check BID.  Supplies sufficient for one month 100 each 11  . diclofenac (VOLTAREN) 75 MG EC tablet Take 1 tablet (75 mg total) by mouth 2 (two) times daily. 30 tablet 0  . Insulin Glargine (LANTUS) 100 UNIT/ML Solostar Pen Inject 64 Units into the skin every morning. Dispense 3 month supply (Patient taking differently: Inject 56 Units into the skin every morning. Dispense 3 month supply) 15 mL 1  . Insulin Pen Needle (B-D UF III MINI PEN NEEDLES) 31G X 5 MM MISC Dispense qt sufficient for two shots daily.  Supplies sufficient for one month. 100 each 11  . levothyroxine (SYNTHROID, LEVOTHROID) 112 MCG tablet take 1 tablet by mouth once daily 90 tablet 3  . Liraglutide 18 MG/3ML SOPN Inject 0.3 mLs (1.8 mg total) into the skin daily. 3 mL 11  .  lisinopril-hydrochlorothiazide (PRINZIDE,ZESTORETIC) 20-12.5 MG per tablet take 2 tablets by mouth once daily (Patient taking differently: take 1 tablets by mouth once daily) 180 tablet 3  . metFORMIN (GLUCOPHAGE XR) 750 MG 24 hr tablet Take 1 tablet (750 mg total) by mouth daily with breakfast. 60 tablet 3  . metFORMIN (GLUCOPHAGE) 1000 MG tablet Take 1 tablet (1,000 mg total) by mouth 2 (two) times daily with a meal. 180 tablet 3  . omeprazole (PRILOSEC) 40 MG capsule Take 1 capsule (40 mg total) by mouth daily. 90 capsule 3  . rosuvastatin (CRESTOR) 20 MG tablet take 1 tablet by mouth once daily 30 tablet 11  . spironolactone (ALDACTONE) 25 MG tablet take 1/2 tablet by mouth once daily 30 tablet 1  .  [DISCONTINUED] Insulin Syringe-Needle U-100 31G X 5/16" 1 ML MISC Use with insulin as directed. 100 each 11   No current facility-administered medications for this visit.     Review of Systems See HPI     Objective:   Physical Exam BP 118/66 mmHg  Pulse 85  Temp(Src) 98 F (36.7 C) (Oral)  Ht 5\' 7"  (1.702 m)  Wt 245 lb 11.2 oz (111.449 kg)  BMI 38.47 kg/m2 Gen: NAD, alert, cooperative with exam, well-appearing Skin: no rashes, normal turgor  Neuro: no gross deficits.  Psych: alert and oriented Knee Exam:  Laterality: left Appearance: No obvious defect Edema: Mildly swollen  Tenderness: Tenderness to the medial joint line and anterior knee, crepitus on palpation Range of Motion: Normal active and passive Patellar Compression: Pain with compression Strength:  Quadricep: 5/5 Hamstring: 5/5 Gait: limp Neurovascularly intact Shoulder: Laterality: right Appearance: No obvious defect Tenderness: No tenderness over the bicipital tendon, no tenderness over the [facility] joint Range of Motion: Normal active and passive of left shoulder. Limited active abduction and flexion to about 100 and normal passive Maneuvers: Empty can: pain reproduced  Internal rotation: normal  External rotation:normal  Hawkin's test: neg O'Brien's test: neg No painful arc and no drop arm sign. Strength:  Bicep: 5/5 Grip: 5/5 Neurovascularly intact       Assessment & Plan:   Right shoulder pain Most likely rotator cuff pathology such as tendinitis versus tendinosis Possible for arthritic changes as well Doubtful for bicipital tendon or acromioclavicular joint  - Voltaren given. She has tried mobic and naproxen with minimal relief. Advised she take it daily for a week then when necessary after that - Given home modalities sheet  - Advised to continue working out but augment her regimen - If no improvement she will call back and need to obtain x-rays and consider injection. Can consider referral to  sports medicine for Ultrasound evaluation and evaluation for nitro patches   Left knee pain Prior imaging demonstrates advanced OA in both knees Most likely has a component of patellofemoral pain is well  No injuries to suggest any ligamentous and exam reassuring  - Votaren sent in  - given home modalities sheet  - If no improvement cane consider repeat x-rays versus injection versus referral to sports medicine for Synvisc injections and body helix or OA web brace

## 2015-08-10 NOTE — Patient Instructions (Signed)
Thank you for coming in,   I have sent Voltaren in to take every day for the first week then as needed after that.  You can try to rub Aspercreme over the painful sites  If there is no improvement then I can refer you to sports medicine for different kinds of injections and therapies.  I would recommend getting a compression knee sleeve for each knee.  I would continue to ice after working out.  Sign up for My Chart to have easy access to your labs results, and communication with your Primary care physician   Please feel free to call with any questions or concerns at any time, at [phone]. --Dr. Raeford Razor

## 2015-08-10 NOTE — Assessment & Plan Note (Signed)
Prior imaging demonstrates advanced OA in both knees Most likely has a component of patellofemoral pain is well  No injuries to suggest any ligamentous and exam reassuring  - Votaren sent in  - given home modalities sheet  - If no improvement cane consider repeat x-rays versus injection versus referral to sports medicine for Synvisc injections and body helix or OA web brace

## 2015-08-10 NOTE — Assessment & Plan Note (Signed)
Most likely rotator cuff pathology such as tendinitis versus tendinosis Possible for arthritic changes as well Doubtful for bicipital tendon or acromioclavicular joint  - Voltaren given. She has tried mobic and naproxen with minimal relief. Advised she take it daily for a week then when necessary after that - Given home modalities sheet  - Advised to continue working out but augment her regimen - If no improvement she will call back and need to obtain x-rays and consider injection. Can consider referral to sports medicine for Ultrasound evaluation and evaluation for nitro patches

## 2015-08-14 ENCOUNTER — Other Ambulatory Visit: Payer: Self-pay | Admitting: Family Medicine

## 2015-08-31 ENCOUNTER — Other Ambulatory Visit: Payer: Self-pay | Admitting: *Deleted

## 2015-08-31 MED ORDER — INSULIN PEN NEEDLE 31G X 5 MM MISC: Status: DC

## 2015-09-01 DIAGNOSIS — H3570 Unspecified separation of retinal layers: Secondary | ICD-10-CM | POA: Diagnosis not present

## 2015-09-01 DIAGNOSIS — H353221 Exudative age-related macular degeneration, left eye, with active choroidal neovascularization: Secondary | ICD-10-CM | POA: Diagnosis not present

## 2015-09-01 DIAGNOSIS — H353134 Nonexudative age-related macular degeneration, bilateral, advanced atrophic with subfoveal involvement: Secondary | ICD-10-CM | POA: Diagnosis not present

## 2015-09-01 DIAGNOSIS — H35371 Puckering of macula, right eye: Secondary | ICD-10-CM | POA: Diagnosis not present

## 2015-09-26 ENCOUNTER — Other Ambulatory Visit: Payer: Self-pay | Admitting: Family Medicine

## 2015-10-04 ENCOUNTER — Ambulatory Visit (INDEPENDENT_AMBULATORY_CARE_PROVIDER_SITE_OTHER): Payer: PPO | Admitting: Family Medicine

## 2015-10-04 ENCOUNTER — Encounter: Payer: Self-pay | Admitting: Family Medicine

## 2015-10-04 VITALS — BP 128/54 | HR 78 | Temp 98.0°F | Wt 247.0 lb

## 2015-10-04 DIAGNOSIS — Z8781 Personal history of (healed) traumatic fracture: Secondary | ICD-10-CM | POA: Diagnosis not present

## 2015-10-04 DIAGNOSIS — M25562 Pain in left knee: Secondary | ICD-10-CM

## 2015-10-04 DIAGNOSIS — H353221 Exudative age-related macular degeneration, left eye, with active choroidal neovascularization: Secondary | ICD-10-CM | POA: Diagnosis not present
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
## 2015-10-04 NOTE — Assessment & Plan Note (Signed)
Pain most likely related to osteoarthritis as observed on imaging from 2015. Pain seems to be getting worse with no improvement from steroid injections and some improvement with oral medications She does not want to pursue any further therapy or knee replacement at this time Not performing any imaging at this time as she denies any acute injury and most likely will be repeated at the orthopedic office. - Place referral to orthopedics for Synvisc injections - Given sheet for home modalities - She will continue to use Voltaren for pain when necessary
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
## 2015-10-04 NOTE — Patient Instructions (Signed)
Thank you for coming in,   I have made a referral to orthopedic surgery for consideration of gel injections.   Please bring all of your medications with you to each visit.   Sign up for My Chart to have easy access to your labs results, and communication with your Primary care physician   Please feel free to call with any questions or concerns at any time, at [phone]. --Dr. Colman Cater Bandage and RICE WHAT DOES AN ELASTIC BANDAGE DO? Elastic bandages come in different shapes and sizes. They generally provide support to your injury and reduce swelling while you are healing, but they can perform different functions. Your health care provider will help you to decide what is best for your protection, recovery, or rehabilitation following an injury. WHAT ARE SOME GENERAL TIPS FOR USING AN ELASTIC BANDAGE?  Use the bandage as directed by the maker of the bandage that you are using.  Do not wrap the bandage too tightly. This may cut off the circulation in the arm or leg in the area below the bandage.  If part of your body beyond the bandage becomes blue, numb, cold, swollen, or is more painful, your bandage is most likely too tight. If this occurs, remove your bandage and reapply it more loosely.  See your health care provider if the bandage seems to be making your problems worse rather than better.  An elastic bandage should be removed and reapplied every 3-4 hours or as directed by your health care provider. WHAT IS RICE? The routine care of many injuries includes rest, ice, compression, and elevation (RICE therapy).  Rest Rest is required to allow your body to heal. Generally, you can resume your routine activities when you are comfortable and have been given permission by your health care provider. Ice Icing your injury helps to keep the swelling down and it reduces pain. Do not apply ice directly to your skin.  Put ice in a plastic bag.  Place a towel between your skin and the  bag.  Leave the ice on for 20 minutes, 2-3 times per day. Do this for as long as you are directed by your health care provider. Compression Compression helps to keep swelling down, gives support, and helps with discomfort. Compression may be done with an elastic bandage. Elevation Elevation helps to reduce swelling and it decreases pain. If possible, your injured area should be placed at or above the level of your heart or the center of your chest. [place]? You should seek medical care if:  You have persistent pain and swelling.  Your symptoms are getting worse rather than improving. These symptoms may indicate that further evaluation or further X-rays are needed. Sometimes, X-rays may not show a small broken bone (fracture) until a number of days later. Make a follow-up appointment with your health care provider. Ask when your X-ray results will be ready. Make sure that you get your X-ray results. WHEN SHOULD I SEEK IMMEDIATE MEDICAL CARE? You should seek immediate medical care if:  You have a sudden onset of severe pain at or below the area of your injury.  You develop redness or increased swelling around your injury.  You have tingling or numbness at or below the area of your injury that does not improve after you remove the elastic bandage.   This information is not intended to replace advice given to you by your health care provider. Make sure you discuss any questions you have with  your health care provider.   Document Released: 02/01/2002 Document Revised: 05/03/2015 Document Reviewed: 03/28/2014 Elsevier Interactive Patient Education [employer].

## 2015-10-05 ENCOUNTER — Ambulatory Visit: Payer: PPO | Admitting: Pharmacist

## 2015-10-09 ENCOUNTER — Other Ambulatory Visit: Payer: Self-pay | Admitting: *Deleted

## 2015-10-09 DIAGNOSIS — I10 Essential (primary) hypertension: Secondary | ICD-10-CM

## 2015-10-10 MED ORDER — AMLODIPINE BESYLATE 5 MG PO TABS: 5.0000 mg | ORAL_TABLET | Freq: Every day | ORAL | Status: DC

## 2015-10-20 DIAGNOSIS — G8929 Other chronic pain: Secondary | ICD-10-CM | POA: Diagnosis not present

## 2015-10-20 DIAGNOSIS — M25562 Pain in left knee: Secondary | ICD-10-CM | POA: Diagnosis not present

## 2015-10-30 ENCOUNTER — Other Ambulatory Visit: Payer: Self-pay | Admitting: *Deleted

## 2015-10-30 MED ORDER — LISINOPRIL-HYDROCHLOROTHIAZIDE 20-12.5 MG PO TABS: ORAL_TABLET | ORAL | Status: DC

## 2015-11-08 ENCOUNTER — Telehealth: Payer: Self-pay | Admitting: Family Medicine

## 2015-11-08 DIAGNOSIS — H353221 Exudative age-related macular degeneration, left eye, with active choroidal neovascularization: Secondary | ICD-10-CM | POA: Diagnosis not present

## 2015-11-08 NOTE — Telephone Encounter (Signed)
LVM on phone to call the office to inform her that she needs to have an appointment for pre-op clearance per Dr. Raeford Razor comment below. Katharina Caper, Hillman Attig D, [place]

## 2015-11-17 NOTE — Telephone Encounter (Signed)
Appointment scheduled for 11/24/15. Katharina Caper, Raye Slyter D, [place]

## 2015-11-22 DIAGNOSIS — E119 Type 2 diabetes mellitus without complications: Secondary | ICD-10-CM | POA: Diagnosis not present

## 2015-11-24 ENCOUNTER — Ambulatory Visit (HOSPITAL_COMMUNITY)
Admission: RE | Admit: 2015-11-24 | Discharge: 2015-11-24 | Disposition: A | Discharge status: Home / Self Care | Payer: PPO | Source: Ambulatory Visit | Attending: Family Medicine | Admitting: Family Medicine

## 2015-11-24 ENCOUNTER — Ambulatory Visit (INDEPENDENT_AMBULATORY_CARE_PROVIDER_SITE_OTHER): Payer: PPO | Admitting: Family Medicine

## 2015-11-24 VITALS — BP 127/84 | HR 85 | Temp 98.5°F | Ht 67.0 in | Wt 244.2 lb

## 2015-11-24 DIAGNOSIS — E119 Type 2 diabetes mellitus without complications: Secondary | ICD-10-CM | POA: Diagnosis not present

## 2015-11-24 DIAGNOSIS — I10 Essential (primary) hypertension: Secondary | ICD-10-CM | POA: Insufficient documentation

## 2015-11-24 DIAGNOSIS — E785 Hyperlipidemia, unspecified: Secondary | ICD-10-CM | POA: Diagnosis not present

## 2015-11-24 DIAGNOSIS — Z01818 Encounter for other preprocedural examination: Secondary | ICD-10-CM | POA: Diagnosis not present

## 2015-11-24 LAB — POCT GLYCOSYLATED HEMOGLOBIN (HGB A1C): Hemoglobin A1C: 7.5

## 2015-11-24 NOTE — Progress Notes (Signed)
Subjective:    Kaitlyn Ferguson - 70 y.o. female MRN [id number]  Date of birth: [birth year]  CC preoperative clearance   HPI  Kaitlyn Ferguson is here for preoperative clearance.  She is planning on having a left knee total replacement.  Wants to have it done as soon as possible.   1) High Risk Cardiac Conditions  1) Recent MI - No.  2) Decompensated Heart Failure - No.  3) unstable angina - No.  4) Symptomatic arrythmia - No.  5) Sx Valvular Disease - No.  2) Intermediate Risk Factors - DM, CKD, CVA, CHF, CAD - Yes.    2) Functional Status - > 4 mets ( Walk, run, climb stairs ) Yes.    3) Surgery Specific Risk - High  (Emergency, Vascular, Extensive ops)          Intermediate (Carotid, Head and Neck, Orthopaedic )          Low (Endoscopic, Cataract, Breast )  4) Further Noninvasive evaluation -   1) EKG - Yes.     1) Hx of CVA, CAD, DM, CKD  2) Echo - No.   1) Worsening dyspnea   3) Stress Testing - Active Cardiac Disease - No.  5) Need for medical therapy - Beta Blocker, Statins indicated ? Yes.    HYPERLIPIDEMIA Symptoms Chest pain on exertion:  no   . Leg claudication:   no Medications: crestor Compliance- yes  Right upper quadrant pain- no   Muscle aches- no     Component Value Date/Time   CHOL 116 10/05/2014 1128   TRIG 237* 10/05/2014 1128   HDL 34* 10/05/2014 1128   LDLDIRECT 56 08/04/2013 0934   VLDL 47* 10/05/2014 1128   CHOLHDL 3.4 10/05/2014 1128   HTN Disease Monitoring: Home BP Monitoring checked two times a month   Medications:amlodipine, lisinopril/HCTZ  Chest pain- no     Dyspnea- no Compliance-  yes.  Lightheadedness-  no   Edema- no    CHRONIC DIABETES Diagnosed at least 10-15 years  Has been on insulin about 10 years  Disease Monitoring  Blood Sugar Ranges: has been higher than normal   Polyuria: no   Visual problems: no   Medication Compliance: yes  Medication Side Effects  Hypoglycemia: no   Preventitive Health  Care  Eye Exam: performed last July   Foot Exam: completed   Diet pattern: has been eating like she should lately   Exercise: limited based on her left knee pain  Health Maintenance:  - declines PNA vaccine - sees an eye doctor every 5 weeks for an injection in her for macular degeneration  Health Maintenance Due  Topic Date Due  . ZOSTAVAX  04/06/2006  . PNA vac Low Risk Adult (1 of 2 - PCV13) 04/07/2011  . TETANUS/TDAP  10/01/2014  . OPHTHALMOLOGY EXAM  12/25/2014  . LIPID PANEL  10/06/2015  . HEMOGLOBIN A1C  11/13/2015    Review of Systems See HPI     Objective:   Physical Exam BP 127/84 mmHg  Pulse 85  Temp(Src) 98.5 F (36.9 C) (Oral)  Ht 5\' 7"  (1.702 m)  Wt 244 lb 3.2 oz (110.768 kg)  BMI 38.24 kg/m2 Gen: NAD, alert, cooperative with exam, CV: RRR, good S1/S2, no murmur, no edema,  Resp: CTABL, no wheezes, non-labored Skin: no rashes, normal turgor  Neuro: no gross deficits.   Assessment & Plan:   Preoperative clearance Having a left knee Total knee replacement.  Advised to  hold aspirin 7 days before  Advised to hold metformin the day of  - EKG completed today  - will send clearance form to surgeon.   HYPERTENSION, BENIGN SYSTEMIC Controlled  - continue current medications.    Diabetes mellitus without complication ([place]) Improvement of A1c  Taking metformin 500 in the morning and 750 at night and tolerating this.  - if she has continued improvement of her A1c, can consider titration down of her insulin.  - hopefully once she has her surgery then she will be able to exercise more.   Hyperlipidemia Controlled  - lipid panel today

## 2015-11-24 NOTE — Assessment & Plan Note (Signed)
Controlled  - lipid panel today

## 2015-11-24 NOTE — Patient Instructions (Signed)
Thank you for coming in,   Please stop the aspirin about a week before the procedure.   Please do not take your metformin the day of your procedure.   Please bring all of your medications with you to each visit.   Sign up for My Chart to have easy access to your labs results, and communication with your Primary care physician   Please feel free to call with any questions or concerns at any time, at [phone]. --Dr. Raeford Razor  Diet Recommendations for Diabetes   Starchy (carb) foods include: Bread, rice, pasta, potatoes, corn, crackers, bagels, muffins, all baked goods.   Protein foods include: Meat, fish, poultry, eggs, dairy foods, and beans such as pinto and kidney beans (beans also provide carbohydrate).   1. Eat at least 3 meals and 1-2 snacks per day. Never go more than 4-5 hours while awake without eating.  2. Limit starchy foods to TWO per meal and ONE per snack. ONE portion of a starchy  food is equal to the following:   - ONE slice of bread (or its equivalent, such as half of a hamburger bun).   - 1/2 cup of a "scoopable" starchy food such as potatoes or rice.   - 1 OUNCE (28 grams) of starchy snack foods such as crackers or pretzels (look on label).   - 15 grams of carbohydrate as shown on food label.  3. Both lunch and dinner should include a protein food, a carb food, and vegetables.   - Obtain twice as many veg's as protein or carbohydrate foods for both lunch and dinner.   - Try to keep frozen veg's on hand for a quick vegetable serving.     - Fresh or frozen veg's are best.  4. Breakfast should always include protein.

## 2015-11-24 NOTE — Assessment & Plan Note (Signed)
Having a left knee Total knee replacement.  Advised to hold aspirin 7 days before  Advised to hold metformin the day of  - EKG completed today  - will send clearance form to surgeon.

## 2015-11-24 NOTE — Assessment & Plan Note (Signed)
Controlled continue current medications. 

## 2015-11-24 NOTE — Assessment & Plan Note (Signed)
Improvement of A1c  Taking metformin 500 in the morning and 750 at night and tolerating this.  - if she has continued improvement of her A1c, can consider titration down of her insulin.  - hopefully once she has her surgery then she will be able to exercise more.

## 2015-12-04 ENCOUNTER — Ambulatory Visit: Payer: Self-pay | Admitting: Orthopedic Surgery

## 2015-12-06 ENCOUNTER — Ambulatory Visit (INDEPENDENT_AMBULATORY_CARE_PROVIDER_SITE_OTHER): Payer: PPO | Admitting: Family Medicine

## 2015-12-06 ENCOUNTER — Telehealth: Payer: Self-pay | Admitting: *Deleted

## 2015-12-06 ENCOUNTER — Encounter: Payer: Self-pay | Admitting: Family Medicine

## 2015-12-06 VITALS — BP 86/48 | HR 83 | Temp 98.4°F | Ht 67.0 in | Wt 237.4 lb

## 2015-12-06 DIAGNOSIS — E119 Type 2 diabetes mellitus without complications: Secondary | ICD-10-CM | POA: Diagnosis not present

## 2015-12-06 DIAGNOSIS — B349 Viral infection, unspecified: Secondary | ICD-10-CM | POA: Diagnosis not present

## 2015-12-06 DIAGNOSIS — R51 Headache: Secondary | ICD-10-CM | POA: Diagnosis not present

## 2015-12-06 DIAGNOSIS — R519 Headache, unspecified: Secondary | ICD-10-CM

## 2015-12-06 LAB — GLUCOSE, CAPILLARY: Glucose-Capillary: 157 mg/dL — ABNORMAL HIGH (ref 65–99)

## 2015-12-06 MED ORDER — KETOROLAC TROMETHAMINE 60 MG/2ML IM SOLN
60.0000 mg | Freq: Once | INTRAMUSCULAR | Status: AC
  Administered 2015-12-06: 60 mg via INTRAMUSCULAR

## 2015-12-06 MED ORDER — ONDANSETRON 4 MG PO TBDP: 4.0000 mg | ORAL_TABLET | Freq: Three times a day (TID) | ORAL | Status: DC | PRN

## 2015-12-06 NOTE — Patient Instructions (Signed)
I think this is a viral illness---much like the flu as you said--and it will improve on its own. For relief of your symptoms I am calling in a Rx for some nausea medicine.  Go home and drink PLENTY of fluids. Take it easy for the rest of today and tomorrow. I am also giving yo a shot for the posterior head pain and muscle spasm you are having. If you get worse in any way, we need to see you back.

## 2015-12-06 NOTE — Progress Notes (Signed)
   Subjective:    Patient ID: Kaitlyn Ferguson, female    DOB: 1946-01-25, 70 y.o.   MRN: [id number]  HPI Chief complaint: Left-sided posterior headache behind her left ear, dizziness, cough, nausea sense Saturday.  She initially developed some mild nasal congestion and headache. That was on Saturday and she had temp of 101.2 that day. Then she developed some myalgias. The headache has, waxed and waned, it is present today 4-5 out of 10, no associated visual changes. She is intermittently lightheaded feeling. Has not been eating well. Has had lots of nausea. She had taken some over-the-counter cough and cold medicine with Tylenol but seemed to help but has not had any of that today. Diarrhea has been loose stools, no cramping, no blood in stool.   Review of Systems The history of present illness    Objective:   Physical Exam  Vital signs reviewed GENERALl: Well developed, well nourished, in no acute distress. HEENT: Mildly tender to palpation at the posterior left occiput and in the posterior neck musculature into the upper left trapezius muscle. NECK: Supple, FROM, without lymphadenopathy. TMs bilaterally show some dullness but are still mobile. Oropharynx is somewhat dry but no exudate is noted. Pupils are equal round reactive to light accommodation. Extraocular muscles are intact. Conjunctivae nonicteric. THYROID: normal without nodularity LUNGS: clear to auscultation bilaterally. No wheezes or rales. HEART: Regular rate and rhythm, no murmurs ABDOMEN: soft with positive bowel sounds MSK: MOE x 4 SKIN no rash NEURO: Alert and oriented 4. Appears to feel unwell but is not toxic. She has normal gait. She asked and answers questions appropriately. Speech is normal in content and fluency.  PERTINENT  PMH / PSH: I have reviewed the patient's medications, allergies, past medical and surgical history. Pertinent findings that relate to today's visit / issues include:DM, HTN,  hypothyroid Last Cr 1.02       Assessment & Plan:  She is not toxic appearing but obviously feels unwell. I don't see any red flags that would make me concerned for intracranial process or CNS infection.  I think this is most likely consistent with a Viral illness.  Tension headache from muscle spasm  Dizziness from poor fluid intake secondary to nausea   We did check her blood sugar in the office today is 157. Her last creatinine in 6 months ago was 1.020 don't really think putting her on NSAIDs for a few days is the perfect option. I will give her Toradol shot.  She did get her flu shot this year. I think her dizziness is most likely related to her nausea and poor fluid intake. We'll give her some nausea medicine, have her increase her fluids. The headache seems mostly related to some neck muscle spasm. I will give her Toradol shot for that. Should that not improve, she can call back and I would consider adding some muscle relaxers. She's instructed to return if symptoms worsen or she does not improve the next couple of days.

## 2015-12-06 NOTE — Telephone Encounter (Signed)
Prior Authorization received from [employer] for Ondansetron ODT. PA completed and faxed to [facility] for review. Derl Barrow, RN

## 2015-12-07 ENCOUNTER — Other Ambulatory Visit: Payer: Self-pay | Admitting: *Deleted

## 2015-12-07 MED ORDER — OMEPRAZOLE 20 MG PO CPDR: 20.0000 mg | DELAYED_RELEASE_CAPSULE | Freq: Every day | ORAL | Status: DC

## 2015-12-07 NOTE — Telephone Encounter (Signed)
Called to check the status of PA. Per representative the PA is not required; they need to setup an override for Medicare Part D to pay for medication.  They will contact the pharmacy.  Derl Barrow, RN

## 2015-12-12 ENCOUNTER — Ambulatory Visit: Payer: Self-pay | Admitting: Orthopedic Surgery

## 2015-12-12 NOTE — H&P (Signed)
TOTAL KNEE ADMISSION H&P  Patient is being admitted for left total knee arthroplasty.  Subjective:  Chief Complaint:left knee pain.  HPI: Kaitlyn Ferguson, 70 y.o. female, has a history of pain and functional disability in the left knee due to arthritis and has failed non-surgical conservative treatments for greater than 12 weeks to includeNSAID's and/or analgesics, corticosteriod injections, flexibility and strengthening excercises, use of assistive devices, weight reduction as appropriate and activity modification.  Onset of symptoms was gradual, starting 4 years ago with gradually worsening course since that time. The patient noted no past surgery on the left knee(s).  Patient currently rates pain in the left knee(s) at 10 out of 10 with activity. Patient has night pain, worsening of pain with activity and weight bearing, pain that interferes with activities of daily living, pain with passive range of motion, crepitus and joint swelling.  Patient has evidence of subchondral cysts, subchondral sclerosis, periarticular osteophytes, joint subluxation and joint space narrowing by imaging studies. There is no active infection.  Patient Active Problem List   Diagnosis Date Noted  . Preoperative clearance 11/24/2015  . History of ankle fracture 10/04/2015  . Left knee pain 08/10/2015  . Right shoulder pain 08/10/2015  . Dizziness 05/16/2015  . Cervical strain 02/28/2015  . Diabetes mellitus without complication ([place]) 11/07/9456  . Hyperlipidemia 03/18/2007  . Hypothyroidism 10/23/2006  . OBESITY, NOS 10/23/2006  . HYPERTENSION, BENIGN SYSTEMIC 10/23/2006  . ARTHRITIS, DEGENERATIVE 10/23/2006   Past Medical History  Diagnosis Date  . Arthritis   . Diabetes mellitus   . Cholelithiasis   . Hyperlipidemia   . Hypothyroidism   . Obesity   . DJD (degenerative joint disease) 10/07/2008  . Osteomyelitis of ankle (HCC)     Complications of Right ankle injury  . DJD (degenerative joint  disease)   . Hypertension   . Neuropathy [facility])     Past Surgical History  Procedure Laterality Date  . Abdominal hysterectomy    . Spine surgery    . Tonsillectomy      Childhood  . Neck mass excision  12/29/2001  . Ankle fusion  02/1996    Right ankle tib/talar  . Carpal tunnel release  1993    Right arm     (Not in a hospital admission) Allergies  Allergen Reactions  . Vancomycin Itching    Pre-medicated with benadryl and tylenol 1 hr before administration of vanc    Social History  Substance Use Topics  . Smoking status: Never Smoker   . Smokeless tobacco: Never Used     Comment: No tobacco currently -   . Alcohol Use: No    Family History  Problem Relation Age of Onset  . Diabetes Brother   . Diabetes Sister     DM2-lost weight-now okay  . Heart attack Father     First MI- early 72's  . Stroke Mother   . Heart failure Mother   . Multiple myeloma Mother     Deceased at 73 due to same     Review of Systems  Constitutional: Positive for diaphoresis.  Eyes: Positive for blurred vision.  Respiratory: Negative.   Cardiovascular: Negative.   Gastrointestinal: Negative.   Genitourinary: Negative.   Musculoskeletal: Positive for back pain and joint pain.  Skin: Negative.   Neurological: Negative.   Endo/Heme/Allergies: Negative.   Psychiatric/Behavioral: Negative.     Objective:  Physical Exam  Vitals reviewed. Constitutional: She is oriented to person, place, and time. She appears well-developed and well-nourished.  HENT:  Head: Normocephalic and atraumatic.  Eyes: Conjunctivae and EOM are normal. Pupils are equal, round, and reactive to light.  Neck: Normal range of motion. Neck supple.  Cardiovascular: Normal rate, regular rhythm and normal heart sounds.   Respiratory: Effort normal and breath sounds normal. No respiratory distress.  GI: Soft. Bowel sounds are normal. She exhibits no distension.  Genitourinary:  deferred  Musculoskeletal:        Left knee: She exhibits decreased range of motion, swelling and effusion. Tenderness found. Medial joint line and lateral joint line tenderness noted.  ROM 18-85 degrees  Neurological: She is oriented to person, place, and time. She has normal reflexes.  Skin: Skin is warm and dry.  Psychiatric: She has a normal mood and affect. Her behavior is normal. Judgment and thought content normal.    Vital signs in last 24 hours: '@VSRANGES' @  Labs:   Estimated body mass index is 37.17 kg/(m^2) as calculated from the following:   Height as of 12/06/15: '5\' 7"'  (1.702 m).   Weight as of 12/06/15: 107.684 kg (237 lb 6.4 oz).   Imaging Review Plain radiographs demonstrate severe degenerative joint disease of the left knee(s). The overall alignment issignificant varus. The bone quality appears to be adequate for age and reported activity level.  Assessment/Plan:  End stage arthritis, left knee   The patient history, physical examination, clinical judgment of the provider and imaging studies are consistent with end stage degenerative joint disease of the left knee(s) and total knee arthroplasty is deemed medically necessary. The treatment options including medical management, injection therapy arthroscopy and arthroplasty were discussed at length. The risks and benefits of total knee arthroplasty were presented and reviewed. The risks due to aseptic loosening, infection, stiffness, patella tracking problems, thromboembolic complications and other imponderables were discussed. The patient acknowledged the explanation, agreed to proceed with the plan and consent was signed. Patient is being admitted for inpatient treatment for surgery, pain control, PT, OT, prophylactic antibiotics, VTE prophylaxis, progressive ambulation and ADL's and discharge planning. The patient is planning to be discharged home with home health services - home with daughter in law

## 2015-12-13 NOTE — Patient Instructions (Addendum)
YOUR PROCEDURE IS SCHEDULED ON :12/21/15  REPORT TO [facility] FOLLOW SIGNS TO EAST ELEVATOR - GO TO 3rd FLOOR CHECK IN AT 3 EAST NURSES STATION (SHORT STAY) AT:  2:45 PM  CALL THIS NUMBER IF YOU HAVE PROBLEMS THE MORNING OF SURGERY [phone]  REMEMBER:ONLY 1 PER PERSON MAY GO TO SHORT STAY WITH YOU TO GET READY THE MORNING OF YOUR SURGERY  DO NOT EAT FOOD  AFTER MIDNIGHT   MAY HAVE CLEAR LIQUIDS UNTIL 10:45 AM  TAKE THESE MEDICINES THE MORNING OF SURGERY:  LEVOTHYROXINE / [place] Allowed                                                                     Foods Excluded  Coffee and tea, regular and decaf                             liquids that you cannot  Plain Jell-O in any flavor                                             see through such as: Fruit ices (not with fruit pulp)                                     milk, soups, orange juice  Iced Popsicles                                                            All solid food Carbonated beverages, regular and diet                                    Cranberry, grape and apple juices Sports drinks like Gatorade Lightly seasoned clear broth or consume(fat free) Sugar, honey syrup   _____________________________________________________________________    YOU MAY NOT HAVE ANY METAL ON YOUR BODY INCLUDING HAIR PINS AND PIERCING'S. DO NOT WEAR JEWELRY, MAKEUP, LOTIONS, POWDERS OR PERFUMES. DO NOT WEAR NAIL POLISH. DO NOT SHAVE 48 HRS PRIOR TO SURGERY. MEN MAY SHAVE FACE AND NECK.  DO NOT [place]. [place] IS NOT RESPONSIBLE FOR VALUABLES.  CONTACTS, DENTURES OR PARTIALS MAY NOT BE WORN TO SURGERY. LEAVE SUITCASE IN CAR. CAN BE BROUGHT TO ROOM AFTER SURGERY.  PATIENTS DISCHARGED THE DAY OF SURGERY WILL NOT BE ALLOWED TO DRIVE HOME.  PLEASE READ OVER THE FOLLOWING INSTRUCTION  SHEETS _________________________________________________________________________________                                          [place] [facility] - PREPARING FOR SURGERY  Before surgery,  you can play an important role.  Because skin is not sterile, your skin needs to be as free of germs as possible.  You can reduce the number of germs on your skin by washing with CHG (chlorahexidine gluconate) soap before surgery.  CHG is an antiseptic cleaner which kills germs and bonds with the skin to continue killing germs even after washing. Please DO NOT use if you have an allergy to CHG or antibacterial soaps.  If your skin becomes reddened/irritated stop using the CHG and inform your nurse when you arrive at Short Stay. Do not shave (including legs and underarms) for at least 48 hours prior to the first CHG shower.  You may shave your face. Please follow these instructions carefully:   1.  Shower with CHG Soap the night before surgery and the  morning of Surgery.   2.  If you choose to wash your hair, wash your hair first as usual with your  normal  Shampoo.   3.  After you shampoo, rinse your hair and body thoroughly to remove the  shampoo.                                         4.  Use CHG as you would any other liquid soap.  You can apply chg directly  to the skin and wash . Gently wash with scrungie or clean wascloth    5.  Apply the CHG Soap to your body ONLY FROM THE NECK DOWN.   Do not use on open                           Wound or open sores. Avoid contact with eyes, ears mouth and genitals (private parts).                        Genitals (private parts) with your normal soap.              6.  Wash thoroughly, paying special attention to the area where your surgery  will be performed.   7.  Thoroughly rinse your body with warm water from the neck down.   8.  DO NOT shower/wash with your normal soap after using and rinsing off  the CHG Soap .                9.  Pat yourself dry with a clean  towel.             10.  Wear clean night clothes to bed after shower             11.  Place clean sheets on your bed the night of your first shower and do not  sleep with pets.  Day of Surgery : Do not apply any lotions/deodorants the morning of surgery.  Please wear clean clothes to the hospital/surgery center.  FAILURE TO FOLLOW THESE INSTRUCTIONS MAY RESULT IN THE CANCELLATION OF YOUR SURGERY    PATIENT SIGNATURE_________________________________  ______________________________________________________________________     Kaitlyn Ferguson  An incentive spirometer is a tool that can help keep your lungs clear and active. This tool measures how well you are filling your lungs with each breath. Taking long deep breaths may help reverse or decrease the chance of developing breathing (pulmonary) problems (especially infection) following:  A long period of  time when you are unable to move or be active. BEFORE THE PROCEDURE   If the spirometer includes an indicator to show your best effort, your nurse or respiratory therapist will set it to a desired goal.  If possible, sit up straight or lean slightly forward. Try not to slouch.  Hold the incentive spirometer in an upright position. INSTRUCTIONS FOR USE   Sit on the edge of your bed if possible, or sit up as far as you can in bed or on a chair.  Hold the incentive spirometer in an upright position.  Breathe out normally.  Place the mouthpiece in your mouth and seal your lips tightly around it.  Breathe in slowly and as deeply as possible, raising the piston or the ball toward the top of the column.  Hold your breath for 3-5 seconds or for as long as possible. Allow the piston or ball to fall to the bottom of the column.  Remove the mouthpiece from your mouth and breathe out normally.  Rest for a few seconds and repeat Steps 1 through 7 at least 10 times every 1-2 hours when you are awake. Take your time and take a few  normal breaths between deep breaths.  The spirometer may include an indicator to show your best effort. Use the indicator as a goal to work toward during each repetition.  After each set of 10 deep breaths, practice coughing to be sure your lungs are clear. If you have an incision (the cut made at the time of surgery), support your incision when coughing by placing a pillow or rolled up towels firmly against it. Once you are able to get out of bed, walk around indoors and cough well. You may stop using the incentive spirometer when instructed by your caregiver.  RISKS AND COMPLICATIONS  Take your time so you do not get dizzy or light-headed.  If you are in pain, you may need to take or ask for pain medication before doing incentive spirometry. It is harder to take a deep breath if you are having pain. AFTER USE  Rest and breathe slowly and easily.  It can be helpful to keep track of a log of your progress. Your caregiver can provide you with a simple table to help with this. If you are using the spirometer at home, follow these instructions: [place] IF:   You are having difficultly using the spirometer.  You have trouble using the spirometer as often as instructed.  Your pain medication is not giving enough relief while using the spirometer.  You develop fever of 100.5 F (38.1 C) or higher. SEEK IMMEDIATE MEDICAL CARE IF:   You cough up bloody sputum that had not been present before.  You develop fever of 102 F (38.9 C) or greater.  You develop worsening pain at or near the incision site. MAKE SURE YOU:   Understand these instructions.  Will watch your condition.  Will get help right away if you are not doing well or get worse. Document Released: 12/23/2006 Document Revised: 11/04/2011 Document Reviewed: 02/23/2007 ExitCare Patient Information 2014 ExitCare, [place].   ________________________________________________________________________  WHAT IS A BLOOD  TRANSFUSION? Blood Transfusion Information  A transfusion is the replacement of blood or some of its parts. Blood is made up of multiple cells which provide different functions.  Red blood cells carry oxygen and are used for blood loss replacement.  White blood cells fight against infection.  Platelets control bleeding.  Plasma helps clot blood.  Other blood products are available for specialized needs, such as hemophilia or other clotting disorders. BEFORE THE TRANSFUSION  Who gives blood for transfusions?   Healthy volunteers who are fully evaluated to make sure their blood is safe. This is blood bank blood. Transfusion therapy is the safest it has ever been in the practice of medicine. Before blood is taken from a donor, a complete history is taken to make sure that person has no history of diseases nor engages in risky social behavior (examples are intravenous drug use or sexual activity with multiple partners). The donor's travel history is screened to minimize risk of transmitting infections, such as malaria. The donated blood is tested for signs of infectious diseases, such as HIV and hepatitis. The blood is then tested to be sure it is compatible with you in order to minimize the chance of a transfusion reaction. If you or a relative donates blood, this is often done in anticipation of surgery and is not appropriate for emergency situations. It takes many days to process the donated blood. RISKS AND COMPLICATIONS Although transfusion therapy is very safe and saves many lives, the main dangers of transfusion include:   Getting an infectious disease.  Developing a transfusion reaction. This is an allergic reaction to something in the blood you were given. Every precaution is taken to prevent this. The decision to have a blood transfusion has been considered carefully by your caregiver before blood is given. Blood is not given unless the benefits outweigh the risks. AFTER THE  TRANSFUSION  Right after receiving a blood transfusion, you will usually feel much better and more energetic. This is especially true if your red blood cells have gotten low (anemic). The transfusion raises the level of the red blood cells which carry oxygen, and this usually causes an energy increase.  The nurse administering the transfusion will monitor you carefully for complications. HOME CARE INSTRUCTIONS  No special instructions are needed after a transfusion. You may find your energy is better. Speak with your caregiver about any limitations on activity for underlying diseases you may have. SEEK MEDICAL CARE IF:   Your condition is not improving after your transfusion.  You develop redness or irritation at the intravenous (IV) site. SEEK IMMEDIATE MEDICAL CARE IF:  Any of the following symptoms occur over the next 12 hours:  Shaking chills.  You have a temperature by mouth above 102 F (38.9 C), not controlled by medicine.  Chest, back, or muscle pain.  People around you feel you are not acting correctly or are confused.  Shortness of breath or difficulty breathing.  Dizziness and fainting.  You get a rash or develop hives.  You have a decrease in urine output.  Your urine turns a dark color or changes to pink, red, or brown. Any of the following symptoms occur over the next 10 days:  You have a temperature by mouth above 102 F (38.9 C), not controlled by medicine.  Shortness of breath.  Weakness after normal activity.  The white part of the eye turns yellow (jaundice).  You have a decrease in the amount of urine or are urinating less often.  Your urine turns a dark color or changes to pink, red, or brown. Document Released: 08/09/2000 Document Revised: 11/04/2011 Document Reviewed: 03/28/2008 [facility] Patient Information 2014 [place], [place].  _______________________________________________________________________

## 2015-12-14 ENCOUNTER — Encounter (HOSPITAL_COMMUNITY)
Admission: RE | Admit: 2015-12-14 | Discharge: 2015-12-14 | Disposition: A | Discharge status: Home / Self Care | Payer: PPO | Source: Ambulatory Visit | Attending: Orthopedic Surgery | Admitting: Orthopedic Surgery

## 2015-12-14 ENCOUNTER — Encounter (HOSPITAL_COMMUNITY): Payer: Self-pay

## 2015-12-14 ENCOUNTER — Encounter (INDEPENDENT_AMBULATORY_CARE_PROVIDER_SITE_OTHER): Payer: Self-pay

## 2015-12-14 DIAGNOSIS — Z01812 Encounter for preprocedural laboratory examination: Secondary | ICD-10-CM | POA: Diagnosis not present

## 2015-12-14 HISTORY — DX: Family history of other specified conditions: Z84.89

## 2015-12-14 HISTORY — DX: Nausea: R11.0

## 2015-12-14 LAB — CBC
HCT: 36.9 % (ref 36.0–46.0)
Hemoglobin: 11.7 g/dL — ABNORMAL LOW (ref 12.0–15.0)
MCH: 25.7 pg — ABNORMAL LOW (ref 26.0–34.0)
MCHC: 31.7 g/dL (ref 30.0–36.0)
MCV: 80.9 fL (ref 78.0–100.0)
Platelets: 305 10*3/uL (ref 150–400)
RBC: 4.56 MIL/uL (ref 3.87–5.11)
RDW: 14 % (ref 11.5–15.5)
WBC: 9.2 10*3/uL (ref 4.0–10.5)

## 2015-12-14 LAB — BASIC METABOLIC PANEL
Anion gap: 10 (ref 5–15)
BUN: 17 mg/dL (ref 6–20)
CO2: 27 mmol/L (ref 22–32)
Calcium: 9.8 mg/dL (ref 8.9–10.3)
Chloride: 107 mmol/L (ref 101–111)
Creatinine, Ser: 0.95 mg/dL (ref 0.44–1.00)
GFR calc Af Amer: >60 mL/min (ref >60)
GFR calc non Af Amer: 60 mL/min — ABNORMAL LOW (ref >60)
Glucose, Bld: 104 mg/dL — ABNORMAL HIGH (ref 65–99)
Potassium: 4.6 mmol/L (ref 3.5–5.1)
Sodium: 144 mmol/L (ref 135–145)

## 2015-12-14 LAB — PROTIME-INR
INR: 1.14 (ref 0.00–1.49)
Prothrombin Time: 14.4 seconds (ref 11.6–15.2)

## 2015-12-14 LAB — SURGICAL PCR SCREEN
MRSA, PCR: NEGATIVE
Staphylococcus aureus: NEGATIVE

## 2015-12-14 LAB — ABO/RH: ABO/RH(D): O POS

## 2015-12-15 LAB — HEMOGLOBIN A1C
Hgb A1c MFr Bld: 7.5 % — ABNORMAL HIGH (ref 4.8–5.6)
Mean Plasma Glucose: 169 mg/dL

## 2015-12-20 DIAGNOSIS — H353221 Exudative age-related macular degeneration, left eye, with active choroidal neovascularization: Secondary | ICD-10-CM | POA: Diagnosis not present

## 2015-12-20 DIAGNOSIS — H35052 Retinal neovascularization, unspecified, left eye: Secondary | ICD-10-CM | POA: Diagnosis not present

## 2015-12-20 DIAGNOSIS — H35361 Drusen (degenerative) of macula, right eye: Secondary | ICD-10-CM | POA: Diagnosis not present

## 2015-12-20 DIAGNOSIS — H353134 Nonexudative age-related macular degeneration, bilateral, advanced atrophic with subfoveal involvement: Secondary | ICD-10-CM | POA: Diagnosis not present

## 2015-12-21 ENCOUNTER — Inpatient Hospital Stay (HOSPITAL_COMMUNITY): Payer: PPO | Admitting: Anesthesiology

## 2015-12-21 ENCOUNTER — Encounter (HOSPITAL_COMMUNITY): Payer: Self-pay | Admitting: *Deleted

## 2015-12-21 ENCOUNTER — Encounter (HOSPITAL_COMMUNITY)
Admission: RE | Disposition: A | Discharge status: Home Health | Payer: Self-pay | Source: Ambulatory Visit | Attending: Orthopedic Surgery

## 2015-12-21 ENCOUNTER — Inpatient Hospital Stay (HOSPITAL_COMMUNITY)
Admission: RE | Admit: 2015-12-21 | Discharge: 2015-12-23 | DRG: 470 | Disposition: A | Discharge status: Home Health | Payer: PPO | Source: Ambulatory Visit | Attending: Orthopedic Surgery | Admitting: Orthopedic Surgery

## 2015-12-21 ENCOUNTER — Inpatient Hospital Stay (HOSPITAL_COMMUNITY): Payer: PPO

## 2015-12-21 DIAGNOSIS — Z96652 Presence of left artificial knee joint: Secondary | ICD-10-CM | POA: Diagnosis not present

## 2015-12-21 DIAGNOSIS — Z8249 Family history of ischemic heart disease and other diseases of the circulatory system: Secondary | ICD-10-CM | POA: Diagnosis not present

## 2015-12-21 DIAGNOSIS — E785 Hyperlipidemia, unspecified: Secondary | ICD-10-CM | POA: Diagnosis present

## 2015-12-21 DIAGNOSIS — E669 Obesity, unspecified: Secondary | ICD-10-CM | POA: Diagnosis not present

## 2015-12-21 DIAGNOSIS — I10 Essential (primary) hypertension: Secondary | ICD-10-CM | POA: Diagnosis present

## 2015-12-21 DIAGNOSIS — M1712 Unilateral primary osteoarthritis, left knee: Secondary | ICD-10-CM | POA: Diagnosis not present

## 2015-12-21 DIAGNOSIS — Z881 Allergy status to other antibiotic agents status: Secondary | ICD-10-CM | POA: Diagnosis not present

## 2015-12-21 DIAGNOSIS — G629 Polyneuropathy, unspecified: Secondary | ICD-10-CM | POA: Diagnosis not present

## 2015-12-21 DIAGNOSIS — Z09 Encounter for follow-up examination after completed treatment for conditions other than malignant neoplasm: Secondary | ICD-10-CM

## 2015-12-21 DIAGNOSIS — E039 Hypothyroidism, unspecified: Secondary | ICD-10-CM | POA: Diagnosis not present

## 2015-12-21 DIAGNOSIS — D62 Acute posthemorrhagic anemia: Secondary | ICD-10-CM | POA: Diagnosis not present

## 2015-12-21 DIAGNOSIS — E119 Type 2 diabetes mellitus without complications: Secondary | ICD-10-CM | POA: Diagnosis not present

## 2015-12-21 DIAGNOSIS — Z471 Aftercare following joint replacement surgery: Secondary | ICD-10-CM | POA: Diagnosis not present

## 2015-12-21 DIAGNOSIS — Z823 Family history of stroke: Secondary | ICD-10-CM

## 2015-12-21 DIAGNOSIS — Z807 Family history of other malignant neoplasms of lymphoid, hematopoietic and related tissues: Secondary | ICD-10-CM | POA: Diagnosis not present

## 2015-12-21 DIAGNOSIS — Z6837 Body mass index (BMI) 37.0-37.9, adult: Secondary | ICD-10-CM

## 2015-12-21 DIAGNOSIS — Z7984 Long term (current) use of oral hypoglycemic drugs: Secondary | ICD-10-CM | POA: Diagnosis not present

## 2015-12-21 DIAGNOSIS — Z833 Family history of diabetes mellitus: Secondary | ICD-10-CM

## 2015-12-21 DIAGNOSIS — M179 Osteoarthritis of knee, unspecified: Secondary | ICD-10-CM | POA: Diagnosis not present

## 2015-12-21 HISTORY — PX: TOTAL KNEE ARTHROPLASTY: SHX125

## 2015-12-21 LAB — TYPE AND SCREEN
ABO/RH(D): O POS
Antibody Screen: NEGATIVE

## 2015-12-21 LAB — GLUCOSE, CAPILLARY
Glucose-Capillary: 107 mg/dL — ABNORMAL HIGH (ref 65–99)
Glucose-Capillary: 289 mg/dL — ABNORMAL HIGH (ref 65–99)
Glucose-Capillary: 315 mg/dL — ABNORMAL HIGH (ref 65–99)

## 2015-12-21 SURGERY — ARTHROPLASTY, KNEE, TOTAL
Anesthesia: Spinal | Site: Knee | Laterality: Left

## 2015-12-21 MED ORDER — DEXAMETHASONE SODIUM PHOSPHATE 10 MG/ML IJ SOLN
INTRAMUSCULAR | Status: DC | PRN
  Administered 2015-12-21: 8 mg via INTRAVENOUS

## 2015-12-21 MED ORDER — DOCUSATE SODIUM 100 MG PO CAPS
100.0000 mg | ORAL_CAPSULE | Freq: Two times a day (BID) | ORAL | Status: DC
  Administered 2015-12-22 – 2015-12-23 (×3): 100 mg via ORAL
  Filled 2015-12-21 (×3): qty 1

## 2015-12-21 MED ORDER — BUPIVACAINE-EPINEPHRINE (PF) 0.25% -1:200000 IJ SOLN
INTRAMUSCULAR | Status: AC
  Filled 2015-12-21: qty 30

## 2015-12-21 MED ORDER — METFORMIN HCL 500 MG PO TABS
750.0000 mg | ORAL_TABLET | Freq: Every day | ORAL | Status: DC
  Administered 2015-12-22: 750 mg via ORAL
  Filled 2015-12-21 (×2): qty 2

## 2015-12-21 MED ORDER — SUGAMMADEX SODIUM 200 MG/2ML IV SOLN
INTRAVENOUS | Status: DC | PRN
  Administered 2015-12-21: 200 mg via INTRAVENOUS

## 2015-12-21 MED ORDER — SPIRONOLACTONE 12.5 MG HALF TABLET
12.5000 mg | ORAL_TABLET | Freq: Every day | ORAL | Status: DC
  Administered 2015-12-22 – 2015-12-23 (×2): 12.5 mg via ORAL
  Filled 2015-12-21 (×2): qty 1

## 2015-12-21 MED ORDER — PROPOFOL 10 MG/ML IV BOLUS
INTRAVENOUS | Status: DC | PRN
  Administered 2015-12-21: 200 mg via INTRAVENOUS
  Administered 2015-12-21: 50 mg via INTRAVENOUS

## 2015-12-21 MED ORDER — PHENOL 1.4 % MT LIQD
1.0000 | OROMUCOSAL | Status: DC | PRN
  Filled 2015-12-21: qty 177

## 2015-12-21 MED ORDER — METOCLOPRAMIDE HCL 5 MG/ML IJ SOLN: 5.0000 mg | Freq: Three times a day (TID) | INTRAMUSCULAR | Status: DC | PRN

## 2015-12-21 MED ORDER — SODIUM CHLORIDE 0.9 % IJ SOLN
INTRAMUSCULAR | Status: AC
  Filled 2015-12-21: qty 30

## 2015-12-21 MED ORDER — MENTHOL 3 MG MT LOZG: 1.0000 | LOZENGE | OROMUCOSAL | Status: DC | PRN

## 2015-12-21 MED ORDER — KETOROLAC TROMETHAMINE 30 MG/ML IJ SOLN
INTRAMUSCULAR | Status: AC
  Filled 2015-12-21: qty 1

## 2015-12-21 MED ORDER — KETOROLAC TROMETHAMINE 15 MG/ML IJ SOLN
15.0000 mg | Freq: Four times a day (QID) | INTRAMUSCULAR | Status: AC
  Administered 2015-12-22 (×4): 15 mg via INTRAVENOUS
  Filled 2015-12-21 (×5): qty 1

## 2015-12-21 MED ORDER — HYDROMORPHONE HCL 1 MG/ML IJ SOLN
0.5000 mg | INTRAMUSCULAR | Status: DC | PRN
  Administered 2015-12-22: 0.5 mg via INTRAVENOUS
  Filled 2015-12-21 (×2): qty 1

## 2015-12-21 MED ORDER — LACTATED RINGERS IV SOLN: INTRAVENOUS | Status: DC

## 2015-12-21 MED ORDER — SODIUM CHLORIDE 0.9 % IV SOLN
INTRAVENOUS | Status: DC
  Administered 2015-12-21 – 2015-12-22 (×2): via INTRAVENOUS

## 2015-12-21 MED ORDER — FENTANYL CITRATE (PF) 250 MCG/5ML IJ SOLN
INTRAMUSCULAR | Status: AC
  Filled 2015-12-21: qty 5

## 2015-12-21 MED ORDER — ONDANSETRON HCL 4 MG PO TABS: 4.0000 mg | ORAL_TABLET | Freq: Four times a day (QID) | ORAL | Status: DC | PRN

## 2015-12-21 MED ORDER — TRANEXAMIC ACID 1000 MG/10ML IV SOLN
1000.0000 mg | Freq: Once | INTRAVENOUS | Status: AC
  Administered 2015-12-22: 1000 mg via INTRAVENOUS
  Filled 2015-12-21: qty 10

## 2015-12-21 MED ORDER — HYDROMORPHONE HCL 1 MG/ML IJ SOLN
INTRAMUSCULAR | Status: AC
  Filled 2015-12-21: qty 1

## 2015-12-21 MED ORDER — LIRAGLUTIDE 18 MG/3ML ~~LOC~~ SOPN: 1.8000 mg | PEN_INJECTOR | Freq: Every day | SUBCUTANEOUS | Status: DC

## 2015-12-21 MED ORDER — VANCOMYCIN HCL IN DEXTROSE 1-5 GM/200ML-% IV SOLN
INTRAVENOUS | Status: AC
Start: 2015-12-21 — End: 2015-12-21
  Filled 2015-12-21: qty 200

## 2015-12-21 MED ORDER — SODIUM CHLORIDE 0.9 % IJ SOLN
INTRAMUSCULAR | Status: AC
  Filled 2015-12-21: qty 50

## 2015-12-21 MED ORDER — ISOPROPYL ALCOHOL 70 % SOLN
Status: DC | PRN
  Administered 2015-12-21: 1 via TOPICAL

## 2015-12-21 MED ORDER — SORBITOL 70 % SOLN
30.0000 mL | Freq: Every day | Status: DC | PRN
  Filled 2015-12-21: qty 30

## 2015-12-21 MED ORDER — HYDROMORPHONE HCL 2 MG/ML IJ SOLN
INTRAMUSCULAR | Status: AC
Start: 2015-12-21 — End: 2015-12-21
  Filled 2015-12-21: qty 1

## 2015-12-21 MED ORDER — LACTATED RINGERS IV SOLN
INTRAVENOUS | Status: DC | PRN
  Administered 2015-12-21: 16:00:00 via INTRAVENOUS

## 2015-12-21 MED ORDER — SUGAMMADEX SODIUM 200 MG/2ML IV SOLN
INTRAVENOUS | Status: AC
  Filled 2015-12-21: qty 2

## 2015-12-21 MED ORDER — HYDROMORPHONE HCL 1 MG/ML IJ SOLN
0.2500 mg | INTRAMUSCULAR | Status: DC | PRN
  Administered 2015-12-21: 0.5 mg via INTRAVENOUS

## 2015-12-21 MED ORDER — ALUM & MAG HYDROXIDE-SIMETH 200-200-20 MG/5ML PO SUSP: 30.0000 mL | ORAL | Status: DC | PRN

## 2015-12-21 MED ORDER — LABETALOL HCL 5 MG/ML IV SOLN
5.0000 mg | INTRAVENOUS | Status: DC | PRN
  Filled 2015-12-21: qty 4

## 2015-12-21 MED ORDER — ACETAMINOPHEN 650 MG RE SUPP
650.0000 mg | Freq: Four times a day (QID) | RECTAL | Status: DC | PRN
Start: 2015-12-21 — End: 2015-12-23

## 2015-12-21 MED ORDER — DEXAMETHASONE SODIUM PHOSPHATE 10 MG/ML IJ SOLN
10.0000 mg | Freq: Once | INTRAMUSCULAR | Status: AC
  Administered 2015-12-22: 10 mg via INTRAVENOUS
  Filled 2015-12-21: qty 1

## 2015-12-21 MED ORDER — LISINOPRIL 20 MG PO TABS
20.0000 mg | ORAL_TABLET | Freq: Every day | ORAL | Status: DC
  Administered 2015-12-22 – 2015-12-23 (×2): 20 mg via ORAL
  Filled 2015-12-21 (×2): qty 1

## 2015-12-21 MED ORDER — ONDANSETRON HCL 4 MG/2ML IJ SOLN
INTRAMUSCULAR | Status: DC | PRN
  Administered 2015-12-21: 4 mg via INTRAVENOUS

## 2015-12-21 MED ORDER — INSULIN GLARGINE 100 UNIT/ML ~~LOC~~ SOLN
60.0000 [IU] | Freq: Every day | SUBCUTANEOUS | Status: DC
  Administered 2015-12-22 – 2015-12-23 (×2): 60 [IU] via SUBCUTANEOUS
  Filled 2015-12-21 (×2): qty 0.6

## 2015-12-21 MED ORDER — METHOCARBAMOL 1000 MG/10ML IJ SOLN
500.0000 mg | Freq: Four times a day (QID) | INTRAVENOUS | Status: DC | PRN
  Filled 2015-12-21: qty 5

## 2015-12-21 MED ORDER — PROPOFOL 10 MG/ML IV BOLUS
INTRAVENOUS | Status: AC
  Filled 2015-12-21: qty 40

## 2015-12-21 MED ORDER — CEFAZOLIN SODIUM-DEXTROSE 2-4 GM/100ML-% IV SOLN
INTRAVENOUS | Status: AC
  Filled 2015-12-21: qty 100

## 2015-12-21 MED ORDER — CHLORHEXIDINE GLUCONATE 4 % EX LIQD: 60.0000 mL | Freq: Once | CUTANEOUS | Status: DC

## 2015-12-21 MED ORDER — METHOCARBAMOL 500 MG PO TABS: 500.0000 mg | ORAL_TABLET | Freq: Four times a day (QID) | ORAL | Status: DC | PRN

## 2015-12-21 MED ORDER — SODIUM CHLORIDE 0.9 % IJ SOLN
INTRAMUSCULAR | Status: DC | PRN
  Administered 2015-12-21: 30 mL

## 2015-12-21 MED ORDER — ACETAMINOPHEN 10 MG/ML IV SOLN
1000.0000 mg | INTRAVENOUS | Status: AC
  Administered 2015-12-21: 1000 mg via INTRAVENOUS

## 2015-12-21 MED ORDER — DEXAMETHASONE SODIUM PHOSPHATE 10 MG/ML IJ SOLN
INTRAMUSCULAR | Status: AC
  Filled 2015-12-21: qty 1

## 2015-12-21 MED ORDER — LEVOTHYROXINE SODIUM 112 MCG PO TABS
112.0000 ug | ORAL_TABLET | Freq: Every day | ORAL | Status: DC
  Administered 2015-12-22 – 2015-12-23 (×2): 112 ug via ORAL
  Filled 2015-12-21 (×3): qty 1

## 2015-12-21 MED ORDER — METOCLOPRAMIDE HCL 10 MG PO TABS: 5.0000 mg | ORAL_TABLET | Freq: Three times a day (TID) | ORAL | Status: DC | PRN

## 2015-12-21 MED ORDER — CEFAZOLIN SODIUM-DEXTROSE 2-4 GM/100ML-% IV SOLN
2.0000 g | Freq: Four times a day (QID) | INTRAVENOUS | Status: AC
  Administered 2015-12-22 – 2015-12-23 (×2): 2 g via INTRAVENOUS
  Filled 2015-12-21 (×2): qty 100

## 2015-12-21 MED ORDER — 0.9 % SODIUM CHLORIDE (POUR BTL) OPTIME
TOPICAL | Status: DC | PRN
  Administered 2015-12-21: 1000 mL

## 2015-12-21 MED ORDER — POVIDONE-IODINE 10 % EX SWAB: 2.0000 "application " | Freq: Once | CUTANEOUS | Status: DC

## 2015-12-21 MED ORDER — CEFAZOLIN SODIUM-DEXTROSE 2-4 GM/100ML-% IV SOLN
2.0000 g | INTRAVENOUS | Status: AC
  Administered 2015-12-21: 2 g via INTRAVENOUS

## 2015-12-21 MED ORDER — SENNA 8.6 MG PO TABS
2.0000 | ORAL_TABLET | Freq: Every day | ORAL | Status: DC
  Administered 2015-12-21 – 2015-12-22 (×2): 17.2 mg via ORAL
  Filled 2015-12-21 (×2): qty 2

## 2015-12-21 MED ORDER — KETOROLAC TROMETHAMINE 30 MG/ML IJ SOLN
INTRAMUSCULAR | Status: DC | PRN
  Administered 2015-12-21: 30 mg via INTRAMUSCULAR

## 2015-12-21 MED ORDER — HYDROMORPHONE HCL 1 MG/ML IJ SOLN
INTRAMUSCULAR | Status: DC | PRN
  Administered 2015-12-21: 0.5 mg via INTRAVENOUS
  Administered 2015-12-21: 1 mg via INTRAVENOUS
  Administered 2015-12-21: 0.5 mg via INTRAVENOUS

## 2015-12-21 MED ORDER — VANCOMYCIN HCL 1000 MG IV SOLR
1000.0000 mg | INTRAVENOUS | Status: DC | PRN
  Administered 2015-12-21: 1000 mg via INTRAVENOUS

## 2015-12-21 MED ORDER — ISOPROPYL ALCOHOL 70 % SOLN
Status: AC
  Filled 2015-12-21: qty 480

## 2015-12-21 MED ORDER — MAGNESIUM CITRATE PO SOLN: 1.0000 | Freq: Once | ORAL | Status: DC | PRN

## 2015-12-21 MED ORDER — AMLODIPINE BESYLATE 5 MG PO TABS
5.0000 mg | ORAL_TABLET | Freq: Every day | ORAL | Status: DC
  Administered 2015-12-22 – 2015-12-23 (×2): 5 mg via ORAL
  Filled 2015-12-21 (×2): qty 1

## 2015-12-21 MED ORDER — METOPROLOL TARTRATE 5 MG/5ML IV SOLN
INTRAVENOUS | Status: DC | PRN
  Administered 2015-12-21: 2 mg via INTRAVENOUS

## 2015-12-21 MED ORDER — METOPROLOL TARTRATE 5 MG/5ML IV SOLN
INTRAVENOUS | Status: AC
  Filled 2015-12-21: qty 5

## 2015-12-21 MED ORDER — BUPIVACAINE-EPINEPHRINE (PF) 0.25% -1:200000 IJ SOLN
INTRAMUSCULAR | Status: DC | PRN
  Administered 2015-12-21: 30 mL

## 2015-12-21 MED ORDER — FENTANYL CITRATE (PF) 100 MCG/2ML IJ SOLN
INTRAMUSCULAR | Status: DC | PRN
  Administered 2015-12-21 (×3): 50 ug via INTRAVENOUS
  Administered 2015-12-21: 100 ug via INTRAVENOUS

## 2015-12-21 MED ORDER — MIDAZOLAM HCL 5 MG/5ML IJ SOLN
INTRAMUSCULAR | Status: DC | PRN
Start: 2015-12-21 — End: 2015-12-21
  Administered 2015-12-21: 2 mg via INTRAVENOUS

## 2015-12-21 MED ORDER — HYDROCHLOROTHIAZIDE 12.5 MG PO CAPS
12.5000 mg | ORAL_CAPSULE | Freq: Every day | ORAL | Status: DC
  Administered 2015-12-22 – 2015-12-23 (×2): 12.5 mg via ORAL
  Filled 2015-12-21 (×2): qty 1

## 2015-12-21 MED ORDER — HYDROCODONE-ACETAMINOPHEN 5-325 MG PO TABS
1.0000 | ORAL_TABLET | ORAL | Status: DC | PRN
  Administered 2015-12-22 (×2): 1 via ORAL
  Administered 2015-12-23: 2 via ORAL
  Filled 2015-12-21 (×4): qty 1

## 2015-12-21 MED ORDER — ROSUVASTATIN CALCIUM 20 MG PO TABS
20.0000 mg | ORAL_TABLET | Freq: Every morning | ORAL | Status: DC
  Administered 2015-12-22 – 2015-12-23 (×2): 20 mg via ORAL
  Filled 2015-12-21 (×2): qty 1

## 2015-12-21 MED ORDER — FENTANYL CITRATE (PF) 100 MCG/2ML IJ SOLN
INTRAMUSCULAR | Status: AC
  Filled 2015-12-21: qty 2

## 2015-12-21 MED ORDER — ONDANSETRON HCL 4 MG/2ML IJ SOLN
4.0000 mg | Freq: Four times a day (QID) | INTRAMUSCULAR | Status: DC | PRN
  Administered 2015-12-21: 4 mg via INTRAVENOUS
  Filled 2015-12-21: qty 2

## 2015-12-21 MED ORDER — APIXABAN 2.5 MG PO TABS
2.5000 mg | ORAL_TABLET | Freq: Two times a day (BID) | ORAL | Status: DC
  Administered 2015-12-22 – 2015-12-23 (×3): 2.5 mg via ORAL
  Filled 2015-12-21 (×4): qty 1

## 2015-12-21 MED ORDER — INSULIN ASPART 100 UNIT/ML ~~LOC~~ SOLN
0.0000 [IU] | Freq: Three times a day (TID) | SUBCUTANEOUS | Status: DC
  Administered 2015-12-21 – 2015-12-22 (×4): 8 [IU] via SUBCUTANEOUS
  Administered 2015-12-23: 5 [IU] via SUBCUTANEOUS

## 2015-12-21 MED ORDER — DIPHENHYDRAMINE HCL 12.5 MG/5ML PO ELIX: 12.5000 mg | ORAL_SOLUTION | ORAL | Status: DC | PRN

## 2015-12-21 MED ORDER — MIDAZOLAM HCL 2 MG/2ML IJ SOLN
INTRAMUSCULAR | Status: AC
  Filled 2015-12-21: qty 2

## 2015-12-21 MED ORDER — SODIUM CHLORIDE 0.9 % IV SOLN: INTRAVENOUS | Status: DC

## 2015-12-21 MED ORDER — ONDANSETRON HCL 4 MG/2ML IJ SOLN
INTRAMUSCULAR | Status: AC
  Filled 2015-12-21: qty 2

## 2015-12-21 MED ORDER — FENTANYL CITRATE (PF) 100 MCG/2ML IJ SOLN
INTRAMUSCULAR | Status: AC
Start: 2015-12-21 — End: 2015-12-21
  Filled 2015-12-21: qty 2

## 2015-12-21 MED ORDER — SODIUM CHLORIDE 0.9 % IR SOLN
Status: DC | PRN
  Administered 2015-12-21: 3000 mL

## 2015-12-21 MED ORDER — ROCURONIUM BROMIDE 100 MG/10ML IV SOLN
INTRAVENOUS | Status: AC
  Filled 2015-12-21: qty 1

## 2015-12-21 MED ORDER — ROCURONIUM BROMIDE 100 MG/10ML IV SOLN
INTRAVENOUS | Status: DC | PRN
  Administered 2015-12-21: 50 mg via INTRAVENOUS

## 2015-12-21 MED ORDER — HYDROGEN PEROXIDE 3 % EX SOLN
CUTANEOUS | Status: AC
  Filled 2015-12-21: qty 473

## 2015-12-21 MED ORDER — LIDOCAINE HCL (CARDIAC) 20 MG/ML IV SOLN
INTRAVENOUS | Status: DC | PRN
  Administered 2015-12-21: 50 mg via INTRATRACHEAL

## 2015-12-21 MED ORDER — METFORMIN HCL 500 MG PO TABS
500.0000 mg | ORAL_TABLET | Freq: Every day | ORAL | Status: DC
  Administered 2015-12-22 – 2015-12-23 (×2): 500 mg via ORAL
  Filled 2015-12-21 (×3): qty 1

## 2015-12-21 MED ORDER — TRANEXAMIC ACID 1000 MG/10ML IV SOLN
1000.0000 mg | INTRAVENOUS | Status: AC
  Administered 2015-12-21: 1000 mg via INTRAVENOUS
  Filled 2015-12-21: qty 10

## 2015-12-21 MED ORDER — ACETAMINOPHEN 325 MG PO TABS: 650.0000 mg | ORAL_TABLET | Freq: Four times a day (QID) | ORAL | Status: DC | PRN

## 2015-12-21 MED ORDER — LISINOPRIL-HYDROCHLOROTHIAZIDE 20-12.5 MG PO TABS: 1.0000 | ORAL_TABLET | Freq: Every day | ORAL | Status: DC

## 2015-12-21 MED ORDER — PANTOPRAZOLE SODIUM 40 MG PO TBEC
40.0000 mg | DELAYED_RELEASE_TABLET | Freq: Every day | ORAL | Status: DC
Start: 2015-12-22 — End: 2015-12-23
  Administered 2015-12-22 – 2015-12-23 (×2): 40 mg via ORAL
  Filled 2015-12-21 (×2): qty 1

## 2015-12-21 MED ORDER — HYDROGEN PEROXIDE 3 % EX SOLN
CUTANEOUS | Status: DC | PRN
  Administered 2015-12-21: 1 via TOPICAL

## 2015-12-21 MED ORDER — ACETAMINOPHEN 10 MG/ML IV SOLN
INTRAVENOUS | Status: AC
  Filled 2015-12-21: qty 100

## 2015-12-21 MED ORDER — INSULIN ASPART 100 UNIT/ML ~~LOC~~ SOLN
SUBCUTANEOUS | Status: AC
  Administered 2015-12-23: 5 [IU] via SUBCUTANEOUS
  Filled 2015-12-21: qty 1

## 2015-12-21 SURGICAL SUPPLY — 58 items
BAG SPEC THK2 15X12 ZIP CLS (MISCELLANEOUS)
BAG ZIPLOCK 12X15 (MISCELLANEOUS) IMPLANT
BANDAGE ACE 4X5 VEL STRL LF (GAUZE/BANDAGES/DRESSINGS) ×2 IMPLANT
BANDAGE ACE 6X5 VEL STRL LF (GAUZE/BANDAGES/DRESSINGS) ×2 IMPLANT
BLADE SAW RECIPROCATING 77.5 (BLADE) ×2 IMPLANT
BONE CEMENT SIMPLEX TOBRAMYCIN (Cement) ×6 IMPLANT
CAPT KNEE TOTAL 3 ×1 IMPLANT
CEMENT BONE SIMPLEX TOBRAMYCIN (Cement) IMPLANT
CHLORAPREP W/TINT 26ML (MISCELLANEOUS) ×4 IMPLANT
CLOTH BEACON ORANGE TIMEOUT ST (SAFETY) ×2 IMPLANT
CUFF TOURN SGL QUICK 34 (TOURNIQUET CUFF) ×2
CUFF TRNQT CYL 34X4X40X1 (TOURNIQUET CUFF) ×1 IMPLANT
DECANTER SPIKE VIAL GLASS SM (MISCELLANEOUS) ×2 IMPLANT
DRAPE LG THREE QUARTER DISP (DRAPES) ×2 IMPLANT
DRAPE SHEET LG 3/4 BI-LAMINATE (DRAPES) ×4 IMPLANT
DRAPE U-SHAPE 47X51 STRL (DRAPES) ×2 IMPLANT
DRSG AQUACEL AG ADV 3.5X10 (GAUZE/BANDAGES/DRESSINGS) ×3 IMPLANT
DRSG TEGADERM 4X4.75 (GAUZE/BANDAGES/DRESSINGS) ×1 IMPLANT
ELECT PENCIL ROCKER SW 15FT (MISCELLANEOUS) ×2 IMPLANT
ELECT REM PT RETURN 15FT ADLT (MISCELLANEOUS) ×2 IMPLANT
EVACUATOR 1/8 PVC DRAIN (DRAIN) ×1 IMPLANT
GAUZE SPONGE 4X4 12PLY STRL (GAUZE/BANDAGES/DRESSINGS) ×2 IMPLANT
GLOVE BIO SURGEON STRL SZ8.5 (GLOVE) ×8 IMPLANT
GLOVE BIOGEL PI IND STRL 8.5 (GLOVE) ×1 IMPLANT
GLOVE BIOGEL PI INDICATOR 8.5 (GLOVE) ×1
GOWN SPEC L3 XXLG W/TWL (GOWN DISPOSABLE) ×2 IMPLANT
HANDPIECE INTERPULSE COAX TIP (DISPOSABLE) ×2
HOOD PEEL AWAY FLYTE STAYCOOL (MISCELLANEOUS) ×4 IMPLANT
LIQUID BAND (GAUZE/BANDAGES/DRESSINGS) ×4 IMPLANT
MANIFOLD NEPTUNE II (INSTRUMENTS) ×2 IMPLANT
MARKER SKIN DUAL TIP RULER LAB (MISCELLANEOUS) ×2 IMPLANT
NDL SPNL 18GX3.5 QUINCKE PK (NEEDLE) ×1 IMPLANT
NEEDLE SPNL 18GX3.5 QUINCKE PK (NEEDLE) ×2 IMPLANT
PACK TOTAL KNEE CUSTOM (KITS) ×2 IMPLANT
PADDING CAST ABS 6INX4YD NS (CAST SUPPLIES) ×1
PADDING CAST ABS COTTON 6X4 NS (CAST SUPPLIES) IMPLANT
PADDING CAST COTTON 6X4 STRL (CAST SUPPLIES) ×2 IMPLANT
POSITIONER SURGICAL ARM (MISCELLANEOUS) ×2 IMPLANT
SAW OSC TIP CART 19.5X105X1.3 (SAW) ×2 IMPLANT
SEALER BIPOLAR AQUA 6.0 (INSTRUMENTS) ×2 IMPLANT
SET HNDPC FAN SPRY TIP SCT (DISPOSABLE) ×1 IMPLANT
SET PAD KNEE POSITIONER (MISCELLANEOUS) ×2 IMPLANT
SOL PREP POV-IOD 4OZ 10% (MISCELLANEOUS) ×2 IMPLANT
SPONGE DRAIN TRACH 4X4 STRL 2S (GAUZE/BANDAGES/DRESSINGS) ×1 IMPLANT
SPONGE LAP 18X18 X RAY DECT (DISPOSABLE) ×1 IMPLANT
SUT MNCRL AB 3-0 PS2 18 (SUTURE) ×2 IMPLANT
SUT MON AB 2-0 CT1 36 (SUTURE) ×4 IMPLANT
SUT VIC AB 1 CT1 36 (SUTURE) ×3 IMPLANT
SUT VIC AB 2-0 CT1 27 (SUTURE) ×2
SUT VIC AB 2-0 CT1 TAPERPNT 27 (SUTURE) ×1 IMPLANT
SUT VLOC 180 0 24IN GS25 (SUTURE) ×2 IMPLANT
SYR 50ML LL SCALE MARK (SYRINGE) ×2 IMPLANT
TOWER CARTRIDGE SMART MIX (DISPOSABLE) ×1 IMPLANT
TRAY FOLEY W/METER SILVER 14FR (SET/KITS/TRAYS/PACK) ×1 IMPLANT
TRAY FOLEY W/METER SILVER 16FR (SET/KITS/TRAYS/PACK) ×1 IMPLANT
WATER STERILE IRR 1500ML POUR (IV SOLUTION) ×2 IMPLANT
WRAP KNEE MAXI GEL POST OP (GAUZE/BANDAGES/DRESSINGS) ×2 IMPLANT
YANKAUER SUCT BULB TIP 10FT TU (MISCELLANEOUS) ×2 IMPLANT

## 2015-12-21 NOTE — Transfer of Care (Signed)
Immediate Anesthesia Transfer of Care Note  Patient: Kaitlyn Ferguson  Procedure(s) Performed: Procedure(s): LEFT TOTAL KNEE ARTHROPLASTY WITH COMPLEX COMPUTER NAVIGATION  (Left)  Patient Location: PACU  Anesthesia Type:General  Level of Consciousness: awake, alert , oriented and patient cooperative  Airway & Oxygen Therapy: Patient Spontanous Breathing and Patient connected to face mask oxygen  Post-op Assessment: Report given to RN, Post -op Vital signs reviewed and stable and Patient moving all extremities  Post vital signs: Reviewed and stable  Last Vitals:  Filed Vitals:   12/21/15 1446  BP: 155/71  Pulse: 72  Temp: 36.3 C  Resp: 20    Last Pain: There were no vitals filed for this visit.       Complications: No apparent anesthesia complications

## 2015-12-21 NOTE — Anesthesia Procedure Notes (Signed)
Procedure Name: Intubation Date/Time: 12/21/2015 4:32 PM Performed by: Nakea Gouger, Virgel Gess Pre-anesthesia Checklist: Patient identified, Emergency Drugs available, Suction available, Patient being monitored and Timeout performed Patient Re-evaluated:Patient Re-evaluated prior to inductionOxygen Delivery Method: Circle system utilized Preoxygenation: Pre-oxygenation with 100% oxygen Intubation Type: IV induction Ventilation: Mask ventilation without difficulty Laryngoscope Size: Mac and 4 Grade View: Grade III Tube type: Oral Tube size: 7.5 mm Number of attempts: 2 Airway Equipment and Method: Stylet and Bougie stylet Placement Confirmation: positive ETCO2,  breath sounds checked- equal and bilateral,  ETT inserted through vocal cords under direct vision and CO2 detector Secured at: 22 cm Tube secured with: Tape Dental Injury: Teeth and Oropharynx as per pre-operative assessment

## 2015-12-21 NOTE — Interval H&P Note (Signed)
History and Physical Interval Note:  12/21/2015 3:54 PM  Kaitlyn Ferguson  has presented today for surgery, with the diagnosis of DEGENERATIVE JOINT DISEASE  LEFT KNEE   The various methods of treatment have been discussed with the patient and family. After consideration of risks, benefits and other options for treatment, the patient has consented to  Procedure(s): LEFT TOTAL KNEE ARTHROPLASTY WITH COMPLEX COMPUTER NAVIGATION  (Left) as a surgical intervention .  The patient's history has been reviewed, patient examined, no change in status, stable for surgery.  I have reviewed the patient's chart and labs.  Questions were answered to the patient's satisfaction.     Lyndel Dancel, Horald Pollen

## 2015-12-21 NOTE — H&P (View-Only) (Signed)
TOTAL KNEE ADMISSION H&P  Patient is being admitted for left total knee arthroplasty.  Subjective:  Chief Complaint:left knee pain.  HPI: Kaitlyn Ferguson, 70 y.o. female, has a history of pain and functional disability in the left knee due to arthritis and has failed non-surgical conservative treatments for greater than 12 weeks to includeNSAID's and/or analgesics, corticosteriod injections, flexibility and strengthening excercises, use of assistive devices, weight reduction as appropriate and activity modification.  Onset of symptoms was gradual, starting 4 years ago with gradually worsening course since that time. The patient noted no past surgery on the left knee(s).  Patient currently rates pain in the left knee(s) at 10 out of 10 with activity. Patient has night pain, worsening of pain with activity and weight bearing, pain that interferes with activities of daily living, pain with passive range of motion, crepitus and joint swelling.  Patient has evidence of subchondral cysts, subchondral sclerosis, periarticular osteophytes, joint subluxation and joint space narrowing by imaging studies. There is no active infection.  Patient Active Problem List   Diagnosis Date Noted  . Preoperative clearance 11/24/2015  . History of ankle fracture 10/04/2015  . Left knee pain 08/10/2015  . Right shoulder pain 08/10/2015  . Dizziness 05/16/2015  . Cervical strain 02/28/2015  . Diabetes mellitus without complication ([place]) 49/44/9675  . Hyperlipidemia 03/18/2007  . Hypothyroidism 10/23/2006  . OBESITY, NOS 10/23/2006  . HYPERTENSION, BENIGN SYSTEMIC 10/23/2006  . ARTHRITIS, DEGENERATIVE 10/23/2006   Past Medical History  Diagnosis Date  . Arthritis   . Diabetes mellitus   . Cholelithiasis   . Hyperlipidemia   . Hypothyroidism   . Obesity   . DJD (degenerative joint disease) 10/07/2008  . Osteomyelitis of ankle ([facility])     Complications of Right ankle injury  . DJD (degenerative joint  disease)   . Hypertension   . Neuropathy [facility])     Past Surgical History  Procedure Laterality Date  . Abdominal hysterectomy    . Spine surgery    . Tonsillectomy      Childhood  . Neck mass excision  12/29/2001  . Ankle fusion  02/1996    Right ankle tib/talar  . Carpal tunnel release  1993    Right arm     (Not in a hospital admission) Allergies  Allergen Reactions  . Vancomycin Itching    Pre-medicated with benadryl and tylenol 1 hr before administration of vanc    Social History  Substance Use Topics  . Smoking status: Never Smoker   . Smokeless tobacco: Never Used     Comment: No tobacco currently -   . Alcohol Use: No    Family History  Problem Relation Age of Onset  . Diabetes Brother   . Diabetes Sister     DM2-lost weight-now okay  . Heart attack Father     First MI- early 52's  . Stroke Mother   . Heart failure Mother   . Multiple myeloma Mother     Deceased at 73 due to same     Review of Systems  Constitutional: Positive for diaphoresis.  Eyes: Positive for blurred vision.  Respiratory: Negative.   Cardiovascular: Negative.   Gastrointestinal: Negative.   Genitourinary: Negative.   Musculoskeletal: Positive for back pain and joint pain.  Skin: Negative.   Neurological: Negative.   Endo/Heme/Allergies: Negative.   Psychiatric/Behavioral: Negative.     Objective:  Physical Exam  Vitals reviewed. Constitutional: She is oriented to person, place, and time. She appears well-developed and well-nourished.  HENT:  Head: Normocephalic and atraumatic.  Eyes: Conjunctivae and EOM are normal. Pupils are equal, round, and reactive to light.  Neck: Normal range of motion. Neck supple.  Cardiovascular: Normal rate, regular rhythm and normal heart sounds.   Respiratory: Effort normal and breath sounds normal. No respiratory distress.  GI: Soft. Bowel sounds are normal. She exhibits no distension.  Genitourinary:  deferred  Musculoskeletal:        Left knee: She exhibits decreased range of motion, swelling and effusion. Tenderness found. Medial joint line and lateral joint line tenderness noted.  ROM 18-85 degrees  Neurological: She is oriented to person, place, and time. She has normal reflexes.  Skin: Skin is warm and dry.  Psychiatric: She has a normal mood and affect. Her behavior is normal. Judgment and thought content normal.    Vital signs in last 24 hours: '@VSRANGES' @  Labs:   Estimated body mass index is 37.17 kg/(m^2) as calculated from the following:   Height as of 12/06/15: '5\' 7"'  (1.702 m).   Weight as of 12/06/15: 107.684 kg (237 lb 6.4 oz).   Imaging Review Plain radiographs demonstrate severe degenerative joint disease of the left knee(s). The overall alignment issignificant varus. The bone quality appears to be adequate for age and reported activity level.  Assessment/Plan:  End stage arthritis, left knee   The patient history, physical examination, clinical judgment of the provider and imaging studies are consistent with end stage degenerative joint disease of the left knee(s) and total knee arthroplasty is deemed medically necessary. The treatment options including medical management, injection therapy arthroscopy and arthroplasty were discussed at length. The risks and benefits of total knee arthroplasty were presented and reviewed. The risks due to aseptic loosening, infection, stiffness, patella tracking problems, thromboembolic complications and other imponderables were discussed. The patient acknowledged the explanation, agreed to proceed with the plan and consent was signed. Patient is being admitted for inpatient treatment for surgery, pain control, PT, OT, prophylactic antibiotics, VTE prophylaxis, progressive ambulation and ADL's and discharge planning. The patient is planning to be discharged home with home health services - home with daughter in law

## 2015-12-21 NOTE — Anesthesia Preprocedure Evaluation (Addendum)
Anesthesia Evaluation  Patient identified by MRN, date of birth, ID band Patient awake    Reviewed: Allergy & Precautions, H&P , NPO status , Patient's Chart, lab work & pertinent test results  Airway Mallampati: II  TM Distance: >3 FB Neck ROM: full    Dental  (+) Dental Advisory Given, Chipped Small chips on both upper front teeth:   Pulmonary neg pulmonary ROS,    Pulmonary exam normal breath sounds clear to auscultation       Cardiovascular hypertension, Pt. on medications Normal cardiovascular exam Rhythm:regular Rate:Normal     Neuro/Psych negative neurological ROS  negative psych ROS   GI/Hepatic negative GI ROS, Neg liver ROS, GERD  Medicated and Controlled,  Endo/Other  diabetes, Well Controlled, Type 2, Oral Hypoglycemic AgentsHypothyroidism   Renal/GU negative Renal ROS  negative genitourinary   Musculoskeletal   Abdominal   Peds  Hematology negative hematology ROS (+)   Anesthesia Other Findings   Reproductive/Obstetrics negative OB ROS                            Anesthesia Physical Anesthesia Plan  ASA: III  Anesthesia Plan: General   Post-op Pain Management:    Induction: Intravenous  Airway Management Planned: Oral ETT  Additional Equipment:   Intra-op Plan:   Post-operative Plan: Extubation in OR  Informed Consent: I have reviewed the patients History and Physical, chart, labs and discussed the procedure including the risks, benefits and alternatives for the proposed anesthesia with the patient or authorized representative who has indicated his/her understanding and acceptance.   Dental Advisory Given  Plan Discussed with: CRNA  Anesthesia Plan Comments:        Anesthesia Quick Evaluation

## 2015-12-21 NOTE — Op Note (Addendum)
OPERATIVE REPORT  SURGEON: Rod Can, MD   ASSISTANT: Nehemiah Massed, PA-C.  PREOPERATIVE DIAGNOSIS: Left knee arthritis with severe stiffness.   POSTOPERATIVE DIAGNOSIS: Left knee arthritis with severe stiffness.   PROCEDURE: Left total knee arthroplasty.   IMPLANTS: Stryker Triathlon CR femur, size 5. Stryker universal tibia, size 6. X3 polyethelyene insert, size 9 mm, CR. 3 button asymmetric patella, size 35 mm. Simplex P bone cement.  ANESTHESIA:  General  TOURNIQUET TIME: 18 min at 347mm Hg.  ESTIMATED BLOOD LOSS: 500 mL.  ANTIBIOTICS: 2 g ancef. 1 g vancomycin.  DRAINS: Medium HV x1.  COMPLICATIONS: None   CONDITION: PACU - hemodynamically stable.   BRIEF CLINICAL NOTE: The patient is a 70 y.o. female with a long-standing history of Left knee arthritis. After failing conservative management, the patient was indicated for total knee arthroplasty. The risks, benefits, and alternatives to the procedure were explained, and the patient elected to proceed.  PROCEDURE IN DETAIL: Spinal anesthesia was obtained in the pre-op holding area. Once inside the operative room, a foley catheter was inserted. The patient was then positioned, a nonsterile tourniquet was placed, and the lower extremity was prepped and draped in the normal sterile surgical fashion. A time-out was called verifying side and site of surgery. The patient received IV antibiotics within 60 minutes of beginning the procedure.   An anterior approach to the knee was performed utilizing a medial parapatellar arthrotomy. A medial release was performed and the patellar fat pad was excised. Stryker navigation was used to cut the distal femur perpendicular to the mechanical axis. A freehand patellar resection was performed, and the patella was sized an prepared with 3 lug holes.  Nagivation was used to make a neutral  proximal tibia resection, taking 8 mm of bone from the less affected lateral side with 3 degrees of slope. The menisci were excised. A spacer block was placed, and the alignment and balance in extension were confirmed.   The distal femur was sized using the 3-degree external rotation guide referencing the posterior femoral cortex. The appropriate 4-in-1 cutting block was pinned into place. Rotation was checked using Whiteside's line, the epicondylar axis, and then confirmed with a spacer block in flexion. The remaining femoral cuts were performed, taking care to protect the MCL. Large osteophytes were liberated from the posteromedial femur.  The tibia was sized and the trial tray was pinned into place. The remaining trail components were inserted. The knee was stable to varus and valgus stress through a full range of motion. The patella tracked centrally, and the PCL was well balanced. The trial components were removed, and the proximal tibial surface was prepared. The extremity was exsanguinated with an Esmarch, and the tourniquet was inflated. Small drill holes were made in the sclerotic subchondral bone.The cut bony surfaces were irrigated with pulse lavage. Final components were cemented into place and excess cement was cleared. The trial insert was placed, and the knee was brought into extension while the cement polymerized. Once the cement was hard, the knee was tested for a final time and found to be well balanced. The trial insert was exchanged for the real polyethylene insert.  The wound was copiously irrigated with a dilute betadine solution followed by normal saline with pulse lavage. Marcaine solution was injected into the periarticular soft tissue. The wound was closed in layers using #1 Vicryl and V-Loc for the fascia, 2-0 Vicryl for the subcutaneous fat, 2-0 Monocryl for the deep dermal layer, 3-0 running Monocryl subcuticular Stitch, and  Dermabond for the skin. Once the glue was fully  dried, an Aquacell Ag and compressive dressing were applied. The tourniquet was let down, and the patient was transported to the recovery room in stable ondition. Sponge, needle, and instrument counts were correct at the end of the case x2. The patient tolerated the procedure well and there were no known complications.  Please note that a surgical assistant was a medical necessity for this procedure in order to perform it in a safe and expeditious manner. Surgical assistant was necessary to retract the ligaments and vital neurovascular structures to prevent injury to them and also necessary for proper positioning of the limb to allow for anatomic placement of the prosthesis.

## 2015-12-22 ENCOUNTER — Encounter (HOSPITAL_COMMUNITY): Payer: Self-pay | Admitting: Orthopedic Surgery

## 2015-12-22 DIAGNOSIS — M1712 Unilateral primary osteoarthritis, left knee: Secondary | ICD-10-CM | POA: Diagnosis not present

## 2015-12-22 LAB — CBC
HCT: 27.7 % — ABNORMAL LOW (ref 36.0–46.0)
Hemoglobin: 8.8 g/dL — ABNORMAL LOW (ref 12.0–15.0)
MCH: 26 pg (ref 26.0–34.0)
MCHC: 31.8 g/dL (ref 30.0–36.0)
MCV: 82 fL (ref 78.0–100.0)
Platelets: 256 10*3/uL (ref 150–400)
RBC: 3.38 MIL/uL — ABNORMAL LOW (ref 3.87–5.11)
RDW: 14.1 % (ref 11.5–15.5)
WBC: 15.5 10*3/uL — ABNORMAL HIGH (ref 4.0–10.5)

## 2015-12-22 LAB — GLUCOSE, CAPILLARY
Glucose-Capillary: 251 mg/dL — ABNORMAL HIGH (ref 65–99)
Glucose-Capillary: 280 mg/dL — ABNORMAL HIGH (ref 65–99)
Glucose-Capillary: 272 mg/dL — ABNORMAL HIGH (ref 65–99)
Glucose-Capillary: 296 mg/dL — ABNORMAL HIGH (ref 65–99)

## 2015-12-22 LAB — BASIC METABOLIC PANEL
Anion gap: 10 (ref 5–15)
BUN: 23 mg/dL — ABNORMAL HIGH (ref 6–20)
CO2: 24 mmol/L (ref 22–32)
Calcium: 8.6 mg/dL — ABNORMAL LOW (ref 8.9–10.3)
Chloride: 105 mmol/L (ref 101–111)
Creatinine, Ser: 1.08 mg/dL — ABNORMAL HIGH (ref 0.44–1.00)
GFR calc Af Amer: 59 mL/min — ABNORMAL LOW (ref >60)
GFR calc non Af Amer: 51 mL/min — ABNORMAL LOW (ref >60)
Glucose, Bld: 313 mg/dL — ABNORMAL HIGH (ref 65–99)
Potassium: 4.3 mmol/L (ref 3.5–5.1)
Sodium: 139 mmol/L (ref 135–145)

## 2015-12-22 MED ORDER — SENNA 8.6 MG PO TABS: 2.0000 | ORAL_TABLET | Freq: Every day | ORAL | Status: DC

## 2015-12-22 MED ORDER — APIXABAN 2.5 MG PO TABS: 2.5000 mg | ORAL_TABLET | Freq: Two times a day (BID) | ORAL | Status: DC

## 2015-12-22 MED ORDER — HYDROCODONE-ACETAMINOPHEN 5-325 MG PO TABS: 1.0000 | ORAL_TABLET | ORAL | Status: DC | PRN

## 2015-12-22 MED ORDER — METHOCARBAMOL 500 MG PO TABS: 500.0000 mg | ORAL_TABLET | Freq: Four times a day (QID) | ORAL | Status: DC | PRN

## 2015-12-22 MED ORDER — DOCUSATE SODIUM 100 MG PO CAPS: 100.0000 mg | ORAL_CAPSULE | Freq: Two times a day (BID) | ORAL | Status: DC

## 2015-12-22 NOTE — Evaluation (Signed)
Physical Therapy Evaluation Patient Details Name: Kaitlyn Ferguson MRN: [id number] DOB: 10-Jan-1946 Today's Date: 12/22/2015   History of Present Illness  s/p L TKA  Clinical Impression  Pt s/p L TKR presents with decreased L LE strength/ROM and post op pain limiting functional mobility.  Pt should progress to dc home with family assist and HHPT follow up.    Follow Up Recommendations Home health PT    Equipment Recommendations  Rolling walker with 5" wheels    Recommendations for Other Services OT consult     Precautions / Restrictions Precautions Precautions: Knee;Fall Restrictions Weight Bearing Restrictions: No LLE Weight Bearing: Weight bearing as tolerated      Mobility  Bed Mobility Overal bed mobility: Needs Assistance Bed Mobility: Supine to Sit     Supine to sit: Min assist     General bed mobility comments: cues for sequence and use of R LE to self assist  Transfers Overall transfer level: Needs assistance Equipment used: Rolling walker (2 wheeled) Transfers: Sit to/from Stand Sit to Stand: Min assist         General transfer comment: steadying assistance and cues for UE/LE placement  Ambulation/Gait Ambulation/Gait assistance: Min assist;Min guard Ambulation Distance (Feet): 38 Feet Assistive device: Rolling walker (2 wheeled) Gait Pattern/deviations: Step-to pattern;Decreased step length - right;Decreased step length - left;Shuffle;Trunk flexed Gait velocity: decr   General Gait Details: cues for sequence, posture and position from [employer]            Wheelchair Mobility    Modified Rankin (Stroke Patients Only)       Balance                                             Pertinent Vitals/Pain Pain Assessment: 0-10 Pain Score: 3  Pain Location: L knee Pain Descriptors / Indicators: Aching;Sore Pain Intervention(s): Limited activity within patient's tolerance;Monitored during session;Premedicated before  session;Ice applied    Home Living Family/patient expects to be discharged to:: Private residence Living Arrangements: Alone Available Help at Discharge: Friend(s);Available 24 hours/day Type of Home: House Home Access: Stairs to enter   [employer] of Steps: 1 Home Layout: One level Home Equipment: None Additional Comments: hand rail next to commode and sink next to it.    Prior Function Level of Independence: Independent               Hand Dominance        Extremity/Trunk Assessment   Upper Extremity Assessment: Defer to OT evaluation RUE Deficits / Details: able to lift to approximately 110; c/o pain and plans to have shoulder checked out when knee heals         Lower Extremity Assessment: LLE deficits/detail   LLE Deficits / Details: 3/5 quads with IND SLR.  AAROM at knee -10 - 25 - pt states min ROM prior to surgery  Cervical / Trunk Assessment: Normal  Communication   Communication: No difficulties  Cognition Arousal/Alertness: Awake/alert Behavior During Therapy: WFL for tasks assessed/performed Overall Cognitive Status: Within Functional Limits for tasks assessed                      General Comments      Exercises Total Joint Exercises Ankle Circles/Pumps: AROM;Both;15 reps;Supine Quad Sets: AROM;Both;10 reps;Supine Heel Slides: AAROM;Left;15 reps;Supine Straight Leg Raises: AAROM;AROM;Left;10 reps;Supine  Assessment/Plan    PT Assessment Patient needs continued PT services  PT Diagnosis Difficulty walking   PT Problem List Decreased strength;Decreased range of motion;Decreased activity tolerance;Decreased mobility;Decreased knowledge of use of DME;Pain;Obesity  PT Treatment Interventions DME instruction;Gait training;Stair training;Functional mobility training;Therapeutic activities;Therapeutic exercise;Patient/family education   PT Goals (Current goals can be found in the Care Plan section) Acute Rehab PT  Goals Patient Stated Goal: be independent; be able to stay alone at night after a couple of days PT Goal Formulation: With patient Time For Goal Achievement: 12/27/15 Potential to Achieve Goals: Good    Frequency 7X/week   Barriers to discharge        Co-evaluation               End of Session Equipment Utilized During Treatment: Gait belt Activity Tolerance: Patient tolerated treatment well Patient left: in chair;with call bell/phone within reach;with chair alarm set Nurse Communication: Mobility status         Time: 1011-1040 PT Time Calculation (min) (ACUTE ONLY): 29 min   Charges:   PT Evaluation $PT Eval Low Complexity: 1 Procedure PT Treatments $Therapeutic Exercise: 8-22 mins   PT G Codes:        Kajuan Guyton December 24, 2015, 12:37 PM

## 2015-12-22 NOTE — Progress Notes (Signed)
Physical Therapy Treatment Patient Details Name: PAMM WROBLEWSKI MRN: [id number] DOB: 23-May-1946 Today's Date: 12/22/2015    History of Present Illness s/p L TKA    PT Comments    Pt very motivated and progressing well with mobility.  Pt hopeful for dc tomorrow.  Follow Up Recommendations  Home health PT     Equipment Recommendations  Rolling walker with 5" wheels    Recommendations for Other Services OT consult     Precautions / Restrictions Precautions Precautions: Knee;Fall Restrictions Weight Bearing Restrictions: No LLE Weight Bearing: Weight bearing as tolerated    Mobility  Bed Mobility Overal bed mobility: Needs Assistance Bed Mobility: Sit to Supine       Sit to supine: Min assist   General bed mobility comments: cues for sequence and use of R LE to self assist  Transfers Overall transfer level: Needs assistance Equipment used: Rolling walker (2 wheeled) Transfers: Sit to/from Stand Sit to Stand: Min assist         General transfer comment: steadying assistance and cues for UE/LE placement  Ambulation/Gait Ambulation/Gait assistance: Min assist;Min guard Ambulation Distance (Feet): 76 Feet Assistive device: Rolling walker (2 wheeled) Gait Pattern/deviations: Step-to pattern;Decreased step length - right;Decreased step length - left;Shuffle;Trunk flexed Gait velocity: decr   General Gait Details: cues for sequence, posture and position from [employer]            Wheelchair Mobility    Modified Rankin (Stroke Patients Only)       Balance                                    Cognition Arousal/Alertness: Awake/alert Behavior During Therapy: WFL for tasks assessed/performed Overall Cognitive Status: Within Functional Limits for tasks assessed                      Exercises Total Joint Exercises Ankle Circles/Pumps: AROM;Both;15 reps;Supine Quad Sets: AROM;Both;10 reps;Supine Heel Slides:  AAROM;Left;15 reps;Supine Straight Leg Raises: AAROM;AROM;Left;10 reps;Supine    General Comments        Pertinent Vitals/Pain Pain Assessment: 0-10 Pain Score: 3  Pain Location: L knee Pain Descriptors / Indicators: Aching;Sore Pain Intervention(s): Limited activity within patient's tolerance;Premedicated before session;Monitored during session;Ice applied    Home Living                      Prior Function            PT Goals (current goals can now be found in the care plan section) Acute Rehab PT Goals Patient Stated Goal: be independent; be able to stay alone at night after a couple of days PT Goal Formulation: With patient Time For Goal Achievement: 12/27/15 Potential to Achieve Goals: Good Progress towards PT goals: Progressing toward goals    Frequency  7X/week    PT Plan Current plan remains appropriate    Co-evaluation             End of Session Equipment Utilized During Treatment: Gait belt Activity Tolerance: Patient tolerated treatment well Patient left: in bed;with call bell/phone within reach;with family/visitor present     Time: [id number] PT Time Calculation (min) (ACUTE ONLY): 23 min  Charges:  $Gait Training: 8-22 mins $Therapeutic Exercise: 8-22 mins                    G Codes:  Kiera Hussey 12/22/2015, 4:31 PM

## 2015-12-22 NOTE — Evaluation (Signed)
Occupational Therapy Evaluation Patient Details Name: Kaitlyn Ferguson MRN: [id number] DOB: 02-Jun-1946 Today's Date: 12/22/2015    History of Present Illness s/p L TKA   Clinical Impression   This 70 year old female was admitted for the above surgery.  She will benefit from skilled OT in acute setting to reinforce education for adls and bathroom transfers. Goals are set for supervision level overall.    Follow Up Recommendations  No OT follow up    Equipment Recommendations  3 in 1 bedside comode    Recommendations for Other Services       Precautions / Restrictions Precautions Precautions: Knee;Fall Restrictions LLE Weight Bearing: Weight bearing as tolerated      Mobility Bed Mobility               General bed mobility comments: oob  Transfers Overall transfer level: Needs assistance Equipment used: Rolling walker (2 wheeled) Transfers: Sit to/from Stand Sit to Stand: Min assist         General transfer comment: steadying assistance and cues for UE/LE placement    Balance                                            ADL Overall ADL's : Needs assistance/impaired     Grooming: Supervision/safety;Oral care;Standing   Upper Body Bathing: Set up;Sitting   Lower Body Bathing: Minimal assistance;Sit to/from stand;With adaptive equipment   Upper Body Dressing : Set up;Sitting   Lower Body Dressing: Moderate assistance;Sit to/from stand;With adaptive equipment   Toilet Transfer: Minimal assistance;Ambulation;BSC;RW   Toileting- Clothing Manipulation and Hygiene: Minimal assistance;Sit to/from stand         General ADL Comments: practiced with AE and performed oral care and toileting.  Pt needs min A when standing as transitions are unsteady     Vision     Perception     Praxis      Pertinent Vitals/Pain Pain Assessment: 0-10 Pain Score: 2  Pain Location: L knee Pain Descriptors / Indicators: Sore Pain  Intervention(s): Limited activity within patient's tolerance;Monitored during session;Premedicated before session;Repositioned (declined more ice)     Hand Dominance     Extremity/Trunk Assessment Upper Extremity Assessment Upper Extremity Assessment: RUE deficits/detail RUE Deficits / Details: able to lift to approximately 110; c/o pain and plans to have shoulder checked out when knee heals           Communication Communication Communication: No difficulties   Cognition Arousal/Alertness: Awake/alert Behavior During Therapy: WFL for tasks assessed/performed Overall Cognitive Status: Within Functional Limits for tasks assessed                     General Comments       Exercises       Shoulder Instructions      Home Living Family/patient expects to be discharged to:: Private residence Living Arrangements: Alone Available Help at Discharge: Friend(s);Available PRN/intermittently (initially 24/7)               Bathroom Shower/Tub: Tub/shower unit   Bathroom Toilet: Handicapped height         Additional Comments: hand rail next to commode and sink next to it.      Prior Functioning/Environment Level of Independence: Independent             OT Diagnosis: Acute pain   OT Problem List: Decreased  strength;Decreased activity tolerance;Decreased knowledge of use of DME or AE;Pain   OT Treatment/Interventions: Self-care/ADL training;DME and/or AE instruction;Patient/family education    OT Goals(Current goals can be found in the care plan section) Acute Rehab OT Goals Patient Stated Goal: be independent; be able to stay alone at night after a couple of days OT Goal Formulation: With patient Time For Goal Achievement: 12/29/15 Potential to Achieve Goals: Good ADL Goals Pt Will Perform Lower Body Bathing: with supervision;with adaptive equipment;sit to/from stand Pt Will Perform Lower Body Dressing: with supervision;with adaptive equipment;sit  to/from stand Pt Will Transfer to Toilet: with supervision;ambulating;bedside commode Pt Will Perform Toileting - Clothing Manipulation and hygiene: with supervision;sit to/from stand Pt Will Perform Tub/Shower Transfer: Shower transfer;ambulating;3 in 1;rolling walker;with supervision  OT Frequency: Min 2X/week   Barriers to D/C:            Co-evaluation              End of Session    Activity Tolerance: Patient tolerated treatment well Patient left: in chair;with call bell/phone within reach;with chair alarm set   Time: [id number] OT Time Calculation (min): 29 min Charges:  OT General Charges $OT Visit: 1 Procedure OT Evaluation $OT Eval Low Complexity: 1 Procedure OT Treatments $Self Care/Home Management : 8-22 mins G-Codes:    Verlinda Slotnick Jan 10, 2016, 12:10 PM Lesle Chris, OTR/L [phone] Jan 10, 2016

## 2015-12-22 NOTE — Care Management Note (Signed)
Case Management Note  Patient Details  Name: ADDALEY PALOMERA MRN: [id number] Date of Birth: 1946/08/06  Subjective/Objective:69 y/o f admitted w/OA L knee. S/p l tka. From home.[facility] already following PTA-rep Tim.[facility] dme rep-Lecretia aware of 3n1,rw to deliver to rm prior d/c.                    Action/Plan:d/c home w/HHC/DME.   Expected Discharge Date:  12/23/15               Expected Discharge Plan:  [place]  In-House Referral:     Discharge planning Services  CM Consult  Post Acute Care Choice:    Choice offered to:     DME Arranged:  3-N-1, Walker rolling DME Agency:  [place]:  PT [facility] Agency:  [place]  Status of Service:  In process, will continue to follow  Medicare Important Message Given:    Date Medicare IM Given:    Medicare IM give by:    Date Additional Medicare IM Given:    Additional Medicare Important Message give by:     If discussed at [place] of Stay Meetings, dates discussed:    Additional Comments:  Dessa Phi, RN 12/22/2015, 11:55 AM

## 2015-12-22 NOTE — Progress Notes (Signed)
[facility]    [name] is providing the following services: RW and Commode  If patient discharges after hours, please call [phone].   Kaitlyn Ferguson 12/22/2015, 1:39 PM

## 2015-12-22 NOTE — Discharge Instructions (Signed)
°Dr. Brian Swinteck °Total Joint Specialist °[facility] °[address] °[place], [place] [address] °[phone] ° °TOTAL KNEE REPLACEMENT POSTOPERATIVE DIRECTIONS ° ° ° °Knee Rehabilitation, Guidelines Following Surgery  °Results after knee surgery are often greatly improved when you follow the exercise, range of motion and muscle strengthening exercises prescribed by your doctor. Safety measures are also important to protect the knee from further injury. Any time any of these exercises cause you to have increased pain or swelling in your knee joint, decrease the amount until you are comfortable again and slowly increase them. If you have problems or questions, call your caregiver or physical therapist for advice.  ° °WEIGHT BEARING °Weight bearing as tolerated with assist device (walker, cane, etc) as directed, use it as long as suggested by your surgeon or therapist, typically at least 4-6 weeks. ° °HOME CARE INSTRUCTIONS  °Remove items at home which could result in a fall. This includes throw rugs or furniture in walking pathways.  °Continue medications as instructed at time of discharge. °You may have some home medications which will be placed on hold until you complete the course of blood thinner medication.  °You may start showering once you are discharged home but do not submerge the incision under water. Just pat the incision dry and apply a dry gauze dressing on daily. °Walk with walker as instructed.  °You may resume a sexual relationship in one month or when given the OK by your doctor.  °· Use walker as long as suggested by your caregivers. °· Avoid periods of inactivity such as sitting longer than an hour when not asleep. This helps prevent blood clots.  °You may put full weight on your legs and walk as much as is comfortable.  °You may return to work once you are cleared by your doctor.  °Do not drive a car for 6 weeks or until released by you surgeon.  °· Do not drive while  taking narcotics.  °Wear the elastic stockings for three weeks following surgery during the day but you may remove then at night. °Make sure you keep all of your appointments after your operation with all of your doctors and caregivers. You should call the office at the above phone number and make an appointment for approximately two weeks after the date of your surgery. °Do not remove your surgical dressing. The dressing is waterproof; you may take showers in 3 days, but do not take tub baths or submerge the dressing. °Please pick up a stool softener and laxative for home use as long as you are requiring pain medications. °· ICE to the affected knee every three hours for 30 minutes at a time and then as needed for pain and swelling.  Continue to use ice on the knee for pain and swelling from surgery. You may notice swelling that will progress down to the foot and ankle.  This is normal after surgery.  Elevate the leg when you are not up walking on it.   °It is important for you to complete the blood thinner medication as prescribed by your doctor. °· Continue to use the breathing machine which will help keep your temperature down.  It is common for your temperature to cycle up and down following surgery, especially at night when you are not up moving around and exerting yourself.  The breathing machine keeps your lungs expanded and your temperature down. ° °RANGE OF MOTION AND STRENGTHENING EXERCISES  °Rehabilitation of the knee is important following a   knee injury or an operation. After just a few days of immobilization, the muscles of the thigh which control the knee become weakened and shrink (atrophy). Knee exercises are designed to build up the tone and strength of the thigh muscles and to improve knee motion. Often times heat used for twenty to thirty minutes before working out will loosen up your tissues and help with improving the range of motion but do not use heat for the first two weeks following  surgery. These exercises can be done on a training (exercise) mat, on the floor, on a table or on a bed. Use what ever works the best and is most comfortable for you Knee exercises include:  Leg Lifts - While your knee is still immobilized in a splint or cast, you can do straight leg raises. Lift the leg to 60 degrees, hold for 3 sec, and slowly lower the leg. Repeat 10-20 times 2-3 times daily. Perform this exercise against resistance later as your knee gets better.  Quad and Hamstring Sets - Tighten up the muscle on the front of the thigh (Quad) and hold for 5-10 sec. Repeat this 10-20 times hourly. Hamstring sets are done by pushing the foot backward against an object and holding for 5-10 sec. Repeat as with quad sets.  A rehabilitation program following serious knee injuries can speed recovery and prevent re-injury in the future due to weakened muscles. Contact your doctor or a physical therapist for more information on knee rehabilitation.   SKILLED REHAB INSTRUCTIONS: If the patient is transferred to a skilled rehab facility following release from the hospital, a list of the current medications will be sent to the facility for the patient to continue.  When discharged from the skilled rehab facility, please have the facility set up the patient's [place] prior to being released. Also, the skilled facility will be responsible for providing the patient with their medications at time of release from the facility to include their pain medication, the muscle relaxants, and their blood thinner medication. If the patient is still at the rehab facility at time of the two week follow up appointment, the skilled rehab facility will also need to assist the patient in arranging follow up appointment in our office and any transportation needs.  MAKE SURE YOU:  Understand these instructions.  Will watch your condition.  Will get help right away if you are not doing well or get worse.     Pick up stool softner and laxative for home use following surgery while on pain medications. Do NOT remove your dressing. You may shower.  Do not take tub baths or submerge incision under water. May shower starting three days after surgery. Please use a clean towel to pat the incision dry following showers. Continue to use ice for pain and swelling after surgery. Do not use any lotions or creams on the incision until instructed by your surgeon.  Information on my medicine - ELIQUIS (apixaban)  This medication education was reviewed with me or my healthcare representative as part of my discharge preparation.  The pharmacist that spoke with me during my hospital stay was:  Legna Mausolf A, RPH  Why was Eliquis prescribed for you? Eliquis was prescribed for you to reduce the risk of blood clots forming after orthopedic surgery.    What do You need to know about Eliquis? Take your Eliquis TWICE DAILY - one tablet in the morning and one tablet in the evening with or without food.  It would be best to take the dose about the same time each day.  If you have difficulty swallowing the tablet whole please discuss with your pharmacist how to take the medication safely.  Take Eliquis exactly as prescribed by your doctor and DO NOT stop taking Eliquis without talking to the doctor who prescribed the medication.  Stopping without other medication to take the place of Eliquis may increase your risk of developing a clot.  After discharge, you should have regular check-up appointments with your healthcare provider that is prescribing your Eliquis.  What do you do if you miss a dose? If a dose of ELIQUIS is not taken at the scheduled time, take it as soon as possible on the same day and twice-daily administration should be resumed.  The dose should not be doubled to make up for a missed dose.  Do not take more than one tablet of ELIQUIS at the same time.  Important Safety Information A  possible side effect of Eliquis is bleeding. You should call your healthcare provider right away if you experience any of the following: ? Bleeding from an injury or your nose that does not stop. ? Unusual colored urine (red or dark brown) or unusual colored stools (red or black). ? Unusual bruising for unknown reasons. ? A serious fall or if you hit your head (even if there is no bleeding).  Some medicines may interact with Eliquis and might increase your risk of bleeding or clotting while on Eliquis. To help avoid this, consult your healthcare provider or pharmacist prior to using any new prescription or non-prescription medications, including herbals, vitamins, non-steroidal anti-inflammatory drugs (NSAIDs) and supplements.  This website has more information on Eliquis (apixaban): http://www.eliquis.com/eliquis/home

## 2015-12-22 NOTE — Progress Notes (Signed)
Inpatient Diabetes Program Recommendations  AACE/ADA: New Consensus Statement on Inpatient Glycemic Control (2015)  Target Ranges:  Prepandial:   less than 140 mg/dL      Peak postprandial:   less than 180 mg/dL (1-2 hours)      Critically ill patients:  140 - 180 mg/dL   Review of Glycemic Control  Diabetes history: DM2 Outpatient Diabetes medications: Lantus 64 units QAM, Liraglutide 1.8 mg QD, metformin 500 mg in am and 750 mg QHS Current orders for Inpatient glycemic control: Lantus 60 units QD, Liraglutide 1.8 mg QD, metformin 500 mg in am and 750 mg QHS, Novolog moderate tidwc  Results for [name], [name] (MRN [id number]) as of 12/22/2015 09:48  Ref. Range 12/22/2015 04:01  Sodium Latest Ref Range: 135-145 mmol/L 139  Potassium Latest Ref Range: 3.5-5.1 mmol/L 4.3  Chloride Latest Ref Range: 101-111 mmol/L 105  CO2 Latest Ref Range: 22-32 mmol/L 24  BUN Latest Ref Range: 6-20 mg/dL 23 (H)  Creatinine Latest Ref Range: 0.44-1.00 mg/dL 1.08 (H)  Calcium Latest Ref Range: 8.9-10.3 mg/dL 8.6 (L)  EGFR (Non-African Amer.) Latest Ref Range: >60 mL/min 51 (L)  EGFR (African American) Latest Ref Range: >60 mL/min 59 (L)  Glucose Latest Ref Range: 65-99 mg/dL 313 (H)  Anion gap Latest Ref Range: 5-15  10   Results for [name], [name] (MRN [id number]) as of 12/22/2015 09:48  Ref. Range 12/21/2015 14:51 12/21/2015 20:29 12/21/2015 21:53 12/22/2015 07:46  Glucose-Capillary Latest Ref Range: 65-99 mg/dL 107 (H) 289 (H) 315 (H) 251 (H)  Results for [name], [name] (MRN [id number]) as of 12/22/2015 09:48  Ref. Range 12/14/2015 10:55  Hemoglobin A1C Latest Ref Range: 4.8-5.6 % 10.52 (H)   70 year old female admitted for L TKA. Received 2 doses of Decadron on 4/27. Glycemic control is acceptable.  Inpatient Diabetes Program Recommendations:    Add Novolog HS correction.  Will continue to follow while inpatient. Thank you. Kaitlyn Ferguson, RD, LDN, CDE Inpatient Diabetes  Coordinator [phone]

## 2015-12-22 NOTE — Progress Notes (Signed)
   Subjective:  Patient reports pain as mild to moderate.  No c/o. Denies N/V/CP/SOB.  Objective:   VITALS:   Filed Vitals:   12/21/15 2239 12/22/15 0036 12/22/15 0242 12/22/15 0512  BP: 170/71 170/72 151/69 138/64  Pulse: 105 97 85 80  Temp: 98.8 F (37.1 C) 98.7 F (37.1 C) 98.2 F (36.8 C) 99 F (37.2 C)  TempSrc: Oral Oral Oral Oral  Resp: 16 14 16 16   Height:      Weight:      SpO2: 97% 97% 98% 98%    ABD soft Sensation intact distally Intact pulses distally Dorsiflexion/Plantar flexion intact Incision: dressing C/D/I Compartment soft   Lab Results  Component Value Date   WBC 15.5* 12/22/2015   HGB 8.8* 12/22/2015   HCT 27.7* 12/22/2015   MCV 82.0 12/22/2015   PLT 256 12/22/2015   BMET    Component Value Date/Time   NA 139 12/22/2015 0401   K 4.3 12/22/2015 0401   CL 105 12/22/2015 0401   CO2 24 12/22/2015 0401   GLUCOSE 313* 12/22/2015 0401   BUN 23* 12/22/2015 0401   CREATININE 1.08* 12/22/2015 0401   CREATININE 1.02* 05/16/2015 1626   CALCIUM 8.6* 12/22/2015 0401   GFRNONAA 51* 12/22/2015 0401   GFRAA 59* 12/22/2015 0401     Assessment/Plan: 1 Day Post-Op   Principal Problem:   Primary osteoarthritis of left knee   WBAT with walker eliquis 2.5 mg PO BID PO pain control PT/OT ABLA: asymptomatic, monitor Strict glucose control - resume all meds today D/C HV drain D/C home tomorrow with HHPT   Carmelle Bamberg, Horald Pollen 12/22/2015, 7:00 AM   Rod Can, MD Cell [phone]

## 2015-12-22 NOTE — Discharge Summary (Signed)
Physician Discharge Summary  Patient ID: Kaitlyn Ferguson MRN: [id number] DOB/AGE: 03-20-1946 16 y.o.  Admit date: 12/21/2015 Discharge date: 12/23/2015  Admission Diagnoses:  Primary osteoarthritis of left knee  Discharge Diagnoses:  Principal Problem:   Primary osteoarthritis of left knee   Past Medical History  Diagnosis Date  . Arthritis   . Diabetes mellitus   . Hyperlipidemia   . Hypothyroidism   . Obesity   . DJD (degenerative joint disease) 10/07/2008  . Osteomyelitis of ankle (HCC)     Complications of Right ankle injury  . DJD (degenerative joint disease)   . Hypertension   . Family history of adverse reaction to anesthesia     MOTHER WAS DIFFICULT TO WAKE UP  . Nausea     INTERMITANT - TAKES PRILOSEC (DENIES REFLUX)    Surgeries: Procedure(s): LEFT TOTAL KNEE ARTHROPLASTY WITH COMPLEX COMPUTER NAVIGATION  on 12/21/2015   Consultants (if any):    Discharged Condition: Improved  Hospital Course: Kaitlyn Ferguson is an 70 y.o. female who was admitted 12/21/2015 with a diagnosis of Primary osteoarthritis of left knee and went to the operating room on 12/21/2015 and underwent the above named procedures.    She was given perioperative antibiotics:      Anti-infectives    Start     Dose/Rate Route Frequency Ordered Stop   12/22/15 2300  ceFAZolin (ANCEF) IVPB 2g/100 mL premix     2 g 200 mL/hr over 30 Minutes Intravenous Every 6 hours 12/21/15 2156 12/23/15 1059   12/21/15 1500  ceFAZolin (ANCEF) IVPB 2g/100 mL premix     2 g 200 mL/hr over 30 Minutes Intravenous On call to O.R. 12/21/15 1449 12/21/15 1634    .  She was given sequential compression devices, early ambulation, and apixaban for DVT prophylaxis.  She benefited maximally from the hospital stay and there were no complications.    Recent vital signs:  Filed Vitals:   12/22/15 0242 12/22/15 0512  BP: 151/69 138/64  Pulse: 85 80  Temp: 98.2 F (36.8 C) 99 F (37.2 C)  Resp: 16 16     Recent laboratory studies:  Lab Results  Component Value Date   HGB 8.8* 12/22/2015   HGB 11.7* 12/14/2015   HGB 13.2 10/05/2014   Lab Results  Component Value Date   WBC 15.5* 12/22/2015   PLT 256 12/22/2015   Lab Results  Component Value Date   INR 1.14 12/14/2015   Lab Results  Component Value Date   NA 139 12/22/2015   K 4.3 12/22/2015   CL 105 12/22/2015   CO2 24 12/22/2015   BUN 23* 12/22/2015   CREATININE 1.08* 12/22/2015   GLUCOSE 313* 12/22/2015    Discharge Medications:     Medication List    STOP taking these medications        acetaminophen 650 MG CR tablet  Commonly known as:  TYLENOL     aspirin 81 MG tablet     diclofenac 75 MG EC tablet  Commonly known as:  VOLTAREN      TAKE these medications        amLODipine 5 MG tablet  Commonly known as:  NORVASC  Take 1 tablet (5 mg total) by mouth daily.     apixaban 2.5 MG Tabs tablet  Commonly known as:  ELIQUIS  Take 1 tablet (2.5 mg total) by mouth every 12 (twelve) hours.     B-D ULTRA-FINE 33 LANCETS Misc  Check BID.  Supplies sufficient for  one month     docusate sodium 100 MG capsule  Commonly known as:  COLACE  Take 1 capsule (100 mg total) by mouth 2 (two) times daily.     HYDROcodone-acetaminophen 5-325 MG tablet  Commonly known as:  NORCO  Take 1-2 tablets by mouth every 4 (four) hours as needed for moderate pain.     Insulin Pen Needle 31G X 5 MM Misc  Commonly known as:  B-D UF III MINI PEN NEEDLES  Dispense qt sufficient for two shots daily.  Supplies sufficient for one month.     LANTUS SOLOSTAR 100 UNIT/ML Solostar Pen  Generic drug:  Insulin Glargine  inject 64 units subcutaneously every morning     levothyroxine 112 MCG tablet  Commonly known as:  SYNTHROID, LEVOTHROID  take 1 tablet by mouth once daily     Liraglutide 18 MG/3ML Sopn  Inject 0.3 mLs (1.8 mg total) into the skin daily.     lisinopril-hydrochlorothiazide 20-12.5 MG tablet  Commonly known as:   PRINZIDE,ZESTORETIC  take 1 tablets by mouth once daily     metFORMIN 500 MG tablet  Commonly known as:  GLUCOPHAGE  Take 500-750 mg by mouth 2 (two) times daily. Pt takes 500 mg in the Am and 750 mg at night     methocarbamol 500 MG tablet  Commonly known as:  ROBAXIN  Take 1 tablet (500 mg total) by mouth every 6 (six) hours as needed for muscle spasms.     omeprazole 40 MG capsule  Commonly known as:  PRILOSEC  Take 1 capsule (40 mg total) by mouth daily.     ondansetron 4 MG disintegrating tablet  Commonly known as:  ZOFRAN-ODT  Take 1 tablet (4 mg total) by mouth every 8 (eight) hours as needed for nausea or vomiting.     rosuvastatin 20 MG tablet  Commonly known as:  CRESTOR  take 1 tablet by mouth once daily     senna 8.6 MG Tabs tablet  Commonly known as:  SENOKOT  Take 2 tablets (17.2 mg total) by mouth at bedtime.     spironolactone 25 MG tablet  Commonly known as:  ALDACTONE  take 1/2 tablet by mouth once daily        Diagnostic Studies: Dg Knee Left Port  12/21/2015  CLINICAL DATA:  Postop left knee EXAM: PORTABLE LEFT KNEE - 1-2 VIEW COMPARISON:  None. FINDINGS: Total left knee replacement is identified. Postsurgical changes including knee drain and joint fluid and air are identified. IMPRESSION: Total left knee replacement without malalignment. Electronically Signed   By: Abelardo Diesel M.D.   On: 12/21/2015 21:04    Disposition: 01-Home or Self Care    Follow-up Information    Follow up with Swinteck, Horald Pollen, MD. Schedule an appointment as soon as possible for a visit in 2 weeks.   Specialty:  Orthopedic Surgery   Why:  For wound re-check   Contact information:   [address] [address] [phone]        Signed: Elie Goody 12/22/2015, 7:05 AM

## 2015-12-23 LAB — CBC
HCT: 25 % — ABNORMAL LOW (ref 36.0–46.0)
Hemoglobin: 8.1 g/dL — ABNORMAL LOW (ref 12.0–15.0)
MCH: 26.4 pg (ref 26.0–34.0)
MCHC: 32.4 g/dL (ref 30.0–36.0)
MCV: 81.4 fL (ref 78.0–100.0)
Platelets: 230 10*3/uL (ref 150–400)
RBC: 3.07 MIL/uL — ABNORMAL LOW (ref 3.87–5.11)
RDW: 14.4 % (ref 11.5–15.5)
WBC: 15.9 10*3/uL — ABNORMAL HIGH (ref 4.0–10.5)

## 2015-12-23 LAB — GLUCOSE, CAPILLARY: Glucose-Capillary: 212 mg/dL — ABNORMAL HIGH (ref 65–99)

## 2015-12-23 NOTE — Progress Notes (Signed)
Occupational Therapy Treatment Patient Details Name: TAMANA HATFIELD MRN: [id number] DOB: [birth year] Today's Date: 12/23/2015    History of present illness s/p L TKA   OT comments  All education completed.  No further OT needs  Follow Up Recommendations  No OT follow up (initial 24/7 then intermittent supervision)    Equipment Recommendations       Recommendations for Other Services      Precautions / Restrictions Precautions Precautions: Knee;Fall Restrictions Weight Bearing Restrictions: No LLE Weight Bearing: Weight bearing as tolerated       Mobility Bed Mobility         Supine to sit: Supervision     General bed mobility comments: used hands to assist LLE.  HOB raised  Transfers   Equipment used: Rolling walker (2 wheeled) Transfers: Sit to/from Stand Sit to Stand: Supervision         General transfer comment: cues for UE/LE placement.  Transitions much smoother today    Balance                                   ADL               Lower Body Bathing: Supervison/ safety;Sit to/from stand;With adaptive equipment       Lower Body Dressing: Supervision/safety;Sit to/from stand;With adaptive equipment           Tub/ Shower Transfer: Walk-in shower;Supervision/safety     General ADL Comments: Performed ADL with AE.  Shoe did not fit due to swelling. Reviewed technique with reacher and shoehorn.  Simiulated shower stall as room's shower had tiles x 3 thickness      Vision                     Perception     Praxis      Cognition   Behavior During Therapy: WFL for tasks assessed/performed Overall Cognitive Status: Within Functional Limits for tasks assessed                       Extremity/Trunk Assessment               Exercises     Shoulder Instructions       General Comments      Pertinent Vitals/ Pain       Pain Score: 2  Pain Location: L knee Pain Descriptors / Indicators:  Sore Pain Intervention(s): Limited activity within patient's tolerance;Monitored during session;Repositioned;Ice applied  Home Living                                          Prior Functioning/Environment              Frequency       Progress Toward Goals  OT Goals(current goals can now be found in the care plan section)  Progress towards OT goals: Progressing toward goals (most goals met; pt does not need continued OT)     Plan      Co-evaluation                 End of Session     Activity Tolerance Patient tolerated treatment well   Patient Left in chair;with call bell/phone within reach;with chair alarm set   Nurse Communication  Time: 8483-5075 OT Time Calculation (min): 26 min  Charges: OT General Charges $OT Visit: 1 Procedure OT Treatments $Self Care/Home Management : 23-37 mins  Caretha Rumbaugh 12/23/2015, 9:59 AM Lesle Chris, OTR/L [phone] 12/23/2015

## 2015-12-23 NOTE — Progress Notes (Signed)
Subjective: 2 Days Post-Op Procedure(s) (LRB): LEFT TOTAL KNEE ARTHROPLASTY WITH COMPLEX COMPUTER NAVIGATION  (Left) Patient reports pain as mild to left knee.  Progressing with PT. Tolerating PO's.Denies CP,SOB, or calf pain. She reports she is ready for D/C.  Objective: Vital signs in last 24 hours: Temp:  [98.2 F (36.8 C)-98.9 F (37.2 C)] 98.3 F (36.8 C) (04/29 0509) Pulse Rate:  [65-84] 67 (04/29 0509) Resp:  [16] 16 (04/29 0509) BP: (108-142)/(46-64) 142/58 mmHg (04/29 0509) SpO2:  [92 %-99 %] 95 % (04/29 0509)  Intake/Output from previous day: 04/28 0701 - 04/29 0700 In: 1950 [P.O.:600; I.V.:1350] Out: 2100 [Urine:2100] Intake/Output this shift:     Recent Labs  12/22/15 0401 12/23/15 0407  HGB 8.8* 8.1*    Recent Labs  12/22/15 0401 12/23/15 0407  WBC 15.5* 15.9*  RBC 3.38* 3.07*  HCT 27.7* 25.0*  PLT 256 230    Recent Labs  12/22/15 0401  NA 139  K 4.3  CL 105  CO2 24  BUN 23*  CREATININE 1.08*  GLUCOSE 313*  CALCIUM 8.6*   No results for input(s): LABPT, INR in the last 72 hours.  Alert and oriented x3. RRR, Lungs clear, BS x4. Left Calf soft and non tender. L knee dressing C/D/I. No DVT signs. No signs of infection or compartment syndrome. LLE grossly neurovascularly intact.   Assessment/Plan: 2 Days Post-Op Procedure(s) (LRB): LEFT TOTAL KNEE ARTHROPLASTY WITH COMPLEX COMPUTER NAVIGATION  (Left) D/c home Up with PT F/u in office with DR.Swinteck Follow instructions Take medications as directed HHPT tomorrow   Letesha Klecker L 12/23/2015, 7:54 AM

## 2015-12-23 NOTE — Evaluation (Signed)
Physical Therapy Evaluation Patient Details Name: Kaitlyn Ferguson MRN: [id number] DOB: Apr 30, 1946 Today's Date: 12/23/2015   History of Present Illness  s/p L TKA  Clinical Impression  Pt progressing well and eager for return home.  Reviewed therex and stairs.    Follow Up Recommendations Home health PT    Equipment Recommendations  Rolling walker with 5" wheels    Recommendations for Other Services OT consult     Precautions / Restrictions Precautions Precautions: Knee;Fall Restrictions Weight Bearing Restrictions: No LLE Weight Bearing: Weight bearing as tolerated      Mobility  Bed Mobility Overal bed mobility: Needs Assistance Bed Mobility: Sit to Supine     Supine to sit: Supervision Sit to supine: Supervision   General bed mobility comments: min cues for sequence  Transfers Overall transfer level: Needs assistance Equipment used: Rolling walker (2 wheeled) Transfers: Sit to/from Stand Sit to Stand: Supervision         General transfer comment: cues for UE/LE placement.  Transitions much smoother today  Ambulation/Gait Ambulation/Gait assistance: Min guard;Supervision Ambulation Distance (Feet): 140 Feet Assistive device: Rolling walker (2 wheeled) Gait Pattern/deviations: Step-to pattern;Decreased step length - right;Decreased step length - left;Shuffle;Trunk flexed Gait velocity: decr   General Gait Details: cues for sequence, posture and position from RW  Stairs Stairs: Yes Stairs assistance: Min assist Stair Management: No rails;Step to pattern;Forwards;With walker Number of Stairs: 3 General stair comments: single step 3x with cues for sequence and foot/RW placement  Wheelchair Mobility    Modified Rankin (Stroke Patients Only)       Balance                                             Pertinent Vitals/Pain Pain Assessment: 0-10 Pain Score: 3  Pain Location: L knee Pain Descriptors / Indicators:  Aching;Sore Pain Intervention(s): Limited activity within patient's tolerance;Monitored during session;Premedicated before session;Ice applied    Home Living                        Prior Function                 Hand Dominance        Extremity/Trunk Assessment                         Communication      Cognition Arousal/Alertness: Awake/alert Behavior During Therapy: WFL for tasks assessed/performed Overall Cognitive Status: Within Functional Limits for tasks assessed                      General Comments      Exercises Total Joint Exercises Ankle Circles/Pumps: AROM;Both;15 reps;Supine Quad Sets: AROM;Both;Supine;15 reps Heel Slides: AAROM;Left;Supine;20 reps Straight Leg Raises: AAROM;AROM;Left;Supine;20 reps Long Arc Quad: AAROM;AROM;Left;10 reps;Seated Goniometric ROM: AAROM L knee -10 - 30      Assessment/Plan    PT Assessment    PT Diagnosis     PT Problem List    PT Treatment Interventions     PT Goals (Current goals can be found in the Care Plan section) Acute Rehab PT Goals Patient Stated Goal: be independent; be able to stay alone at night after a couple of days PT Goal Formulation: With patient Time For Goal Achievement: 12/27/15 Potential to Achieve Goals: Good    Frequency  7X/week   Barriers to discharge        Co-evaluation               End of Session Equipment Utilized During Treatment: Gait belt Activity Tolerance: Patient tolerated treatment well Patient left: in bed;with call bell/phone within reach;with family/visitor present Nurse Communication: Mobility status         Time: [id number] PT Time Calculation (min) (ACUTE ONLY): 23 min   Charges:     PT Treatments $Gait Training: 8-22 mins $Therapeutic Exercise: 8-22 mins   PT G Codes:        Kalan Rinn Jan 03, 2016, 12:20 PM

## 2015-12-23 NOTE — Progress Notes (Signed)
Discharged from floor via w/c for transport home. Belongings & family with pt. No changes in assessment. Aerith Canal, [employer]

## 2015-12-24 DIAGNOSIS — Z96652 Presence of left artificial knee joint: Secondary | ICD-10-CM | POA: Diagnosis not present

## 2015-12-24 DIAGNOSIS — I10 Essential (primary) hypertension: Secondary | ICD-10-CM | POA: Diagnosis not present

## 2015-12-24 DIAGNOSIS — Z794 Long term (current) use of insulin: Secondary | ICD-10-CM | POA: Diagnosis not present

## 2015-12-24 DIAGNOSIS — E1142 Type 2 diabetes mellitus with diabetic polyneuropathy: Secondary | ICD-10-CM | POA: Diagnosis not present

## 2015-12-24 DIAGNOSIS — Z6837 Body mass index (BMI) 37.0-37.9, adult: Secondary | ICD-10-CM | POA: Diagnosis not present

## 2015-12-24 DIAGNOSIS — Z9181 History of falling: Secondary | ICD-10-CM | POA: Diagnosis not present

## 2015-12-24 DIAGNOSIS — E669 Obesity, unspecified: Secondary | ICD-10-CM | POA: Diagnosis not present

## 2015-12-24 DIAGNOSIS — Z981 Arthrodesis status: Secondary | ICD-10-CM | POA: Diagnosis not present

## 2015-12-24 DIAGNOSIS — Z79891 Long term (current) use of opiate analgesic: Secondary | ICD-10-CM | POA: Diagnosis not present

## 2015-12-24 DIAGNOSIS — Z7901 Long term (current) use of anticoagulants: Secondary | ICD-10-CM | POA: Diagnosis not present

## 2015-12-24 DIAGNOSIS — Z471 Aftercare following joint replacement surgery: Secondary | ICD-10-CM | POA: Diagnosis not present

## 2015-12-24 DIAGNOSIS — M1991 Primary osteoarthritis, unspecified site: Secondary | ICD-10-CM | POA: Diagnosis not present

## 2015-12-24 NOTE — Anesthesia Postprocedure Evaluation (Signed)
Anesthesia Post Note  Patient: Kaitlyn Ferguson  Procedure(s) Performed: Procedure(s) (LRB): LEFT TOTAL KNEE ARTHROPLASTY WITH COMPLEX COMPUTER NAVIGATION  (Left)  Patient location during evaluation: PACU Anesthesia Type: General Level of consciousness: awake and alert Pain management: pain level controlled Vital Signs Assessment: post-procedure vital signs reviewed and stable Respiratory status: spontaneous breathing, nonlabored ventilation, respiratory function stable and patient connected to nasal cannula oxygen Cardiovascular status: blood pressure returned to baseline and stable Postop Assessment: no signs of nausea or vomiting Anesthetic complications: no    Last Vitals:  Filed Vitals:   12/22/15 2119 12/23/15 0509  BP: 108/46 142/58  Pulse: 65 67  Temp: 36.8 C 36.8 C  Resp: 16 16    Last Pain:  Filed Vitals:   12/23/15 1431  PainSc: 0-No pain                 Jacksyn Beeks L

## 2016-01-01 DIAGNOSIS — Z6837 Body mass index (BMI) 37.0-37.9, adult: Secondary | ICD-10-CM | POA: Diagnosis not present

## 2016-01-01 DIAGNOSIS — Z794 Long term (current) use of insulin: Secondary | ICD-10-CM | POA: Diagnosis not present

## 2016-01-01 DIAGNOSIS — Z79891 Long term (current) use of opiate analgesic: Secondary | ICD-10-CM | POA: Diagnosis not present

## 2016-01-01 DIAGNOSIS — Z981 Arthrodesis status: Secondary | ICD-10-CM | POA: Diagnosis not present

## 2016-01-01 DIAGNOSIS — Z96652 Presence of left artificial knee joint: Secondary | ICD-10-CM | POA: Diagnosis not present

## 2016-01-01 DIAGNOSIS — M1991 Primary osteoarthritis, unspecified site: Secondary | ICD-10-CM | POA: Diagnosis not present

## 2016-01-01 DIAGNOSIS — E669 Obesity, unspecified: Secondary | ICD-10-CM | POA: Diagnosis not present

## 2016-01-01 DIAGNOSIS — I10 Essential (primary) hypertension: Secondary | ICD-10-CM | POA: Diagnosis not present

## 2016-01-01 DIAGNOSIS — Z7901 Long term (current) use of anticoagulants: Secondary | ICD-10-CM | POA: Diagnosis not present

## 2016-01-01 DIAGNOSIS — E1142 Type 2 diabetes mellitus with diabetic polyneuropathy: Secondary | ICD-10-CM | POA: Diagnosis not present

## 2016-01-01 DIAGNOSIS — Z471 Aftercare following joint replacement surgery: Secondary | ICD-10-CM | POA: Diagnosis not present

## 2016-01-01 DIAGNOSIS — Z9181 History of falling: Secondary | ICD-10-CM | POA: Diagnosis not present

## 2016-01-10 DIAGNOSIS — G8929 Other chronic pain: Secondary | ICD-10-CM | POA: Diagnosis not present

## 2016-01-10 DIAGNOSIS — M25562 Pain in left knee: Secondary | ICD-10-CM | POA: Diagnosis not present

## 2016-01-11 ENCOUNTER — Other Ambulatory Visit: Payer: Self-pay | Admitting: *Deleted

## 2016-01-11 MED ORDER — ROSUVASTATIN CALCIUM 20 MG PO TABS: ORAL_TABLET | ORAL | Status: DC

## 2016-01-12 DIAGNOSIS — M25562 Pain in left knee: Secondary | ICD-10-CM | POA: Diagnosis not present

## 2016-01-12 DIAGNOSIS — G8929 Other chronic pain: Secondary | ICD-10-CM | POA: Diagnosis not present

## 2016-01-15 DIAGNOSIS — G8929 Other chronic pain: Secondary | ICD-10-CM | POA: Diagnosis not present

## 2016-01-15 DIAGNOSIS — M25562 Pain in left knee: Secondary | ICD-10-CM | POA: Diagnosis not present

## 2016-01-17 ENCOUNTER — Other Ambulatory Visit: Payer: Self-pay | Admitting: *Deleted

## 2016-01-17 DIAGNOSIS — G8929 Other chronic pain: Secondary | ICD-10-CM | POA: Diagnosis not present

## 2016-01-17 DIAGNOSIS — M25562 Pain in left knee: Secondary | ICD-10-CM | POA: Diagnosis not present

## 2016-01-17 MED ORDER — INSULIN GLARGINE 100 UNIT/ML SOLOSTAR PEN: PEN_INJECTOR | SUBCUTANEOUS | Status: DC

## 2016-01-19 ENCOUNTER — Other Ambulatory Visit: Payer: Self-pay | Admitting: *Deleted

## 2016-01-19 DIAGNOSIS — M25562 Pain in left knee: Secondary | ICD-10-CM | POA: Diagnosis not present

## 2016-01-19 DIAGNOSIS — G8929 Other chronic pain: Secondary | ICD-10-CM | POA: Diagnosis not present

## 2016-01-19 MED ORDER — METFORMIN HCL 500 MG PO TABS: 500.0000 mg | ORAL_TABLET | Freq: Two times a day (BID) | ORAL | Status: DC

## 2016-01-23 DIAGNOSIS — H353221 Exudative age-related macular degeneration, left eye, with active choroidal neovascularization: Secondary | ICD-10-CM | POA: Diagnosis not present

## 2016-01-23 DIAGNOSIS — H353134 Nonexudative age-related macular degeneration, bilateral, advanced atrophic with subfoveal involvement: Secondary | ICD-10-CM | POA: Diagnosis not present

## 2016-01-23 DIAGNOSIS — H35371 Puckering of macula, right eye: Secondary | ICD-10-CM | POA: Diagnosis not present

## 2016-01-23 DIAGNOSIS — M25562 Pain in left knee: Secondary | ICD-10-CM | POA: Diagnosis not present

## 2016-01-23 DIAGNOSIS — G8929 Other chronic pain: Secondary | ICD-10-CM | POA: Diagnosis not present

## 2016-01-29 DIAGNOSIS — M25562 Pain in left knee: Secondary | ICD-10-CM | POA: Diagnosis not present

## 2016-01-29 DIAGNOSIS — G8929 Other chronic pain: Secondary | ICD-10-CM | POA: Diagnosis not present

## 2016-01-31 DIAGNOSIS — M25562 Pain in left knee: Secondary | ICD-10-CM | POA: Diagnosis not present

## 2016-01-31 DIAGNOSIS — G8929 Other chronic pain: Secondary | ICD-10-CM | POA: Diagnosis not present

## 2016-02-01 ENCOUNTER — Telehealth: Payer: Self-pay | Admitting: Family Medicine

## 2016-02-01 NOTE — Telephone Encounter (Signed)
Left VM for patient. If she calls back please have her speak with a nurse/CMA and ask if she has had diabetic shoes before and if she has, what qualified her for them.  She has to have a history of partial or complete foot amputation, previous foot ulceration, pre-ulcerative callus, peripheral neuropathy with callus formation, foot deformity, poor ciculation.   If any questions then please take the best time and phone number to call and I will try to call her back.   Rosemarie Ax, MD PGY-3, [facility] 02/01/2016, 10:09 AM

## 2016-02-01 NOTE — Telephone Encounter (Signed)
Pt states that she did not have neuropathy or ulcer or any of the below.  She states "they gave it to me because of my Diabetes". Kaitlyn Ferguson, Kaitlyn Ferguson, CMA

## 2016-02-01 NOTE — Telephone Encounter (Signed)
Spoke with patient about her diabetic shoes. She just got some new ones about 2-3 months ago. We will not fill out the forms for now.   Rosemarie Ax, MD PGY-3, [facility] 02/01/2016, 3:52 PM

## 2016-02-05 ENCOUNTER — Encounter: Payer: Self-pay | Admitting: Family Medicine

## 2016-02-05 ENCOUNTER — Ambulatory Visit (INDEPENDENT_AMBULATORY_CARE_PROVIDER_SITE_OTHER): Payer: PPO | Admitting: Family Medicine

## 2016-02-05 VITALS — BP 141/65 | HR 98 | Temp 98.1°F | Wt 236.0 lb

## 2016-02-05 DIAGNOSIS — E038 Other specified hypothyroidism: Secondary | ICD-10-CM | POA: Diagnosis not present

## 2016-02-05 DIAGNOSIS — E785 Hyperlipidemia, unspecified: Secondary | ICD-10-CM

## 2016-02-05 DIAGNOSIS — M25562 Pain in left knee: Secondary | ICD-10-CM | POA: Diagnosis not present

## 2016-02-05 DIAGNOSIS — G8929 Other chronic pain: Secondary | ICD-10-CM | POA: Diagnosis not present

## 2016-02-05 LAB — TSH: TSH: 0.51 mIU/L

## 2016-02-05 NOTE — Patient Instructions (Signed)
Thank you for coming in,   I will call or send a letter with the results from today.   Please bring all of your medications with you to each visit.   Health maintenance items that are due.  Health Maintenance  Topic Date Due  . Shingles Vaccine  04/06/2006  . Pneumonia vaccines (1 of 2 - PCV13) 04/07/2011  . Tetanus Vaccine  10/01/2014  . Eye exam for diabetics  12/25/2014  . Lipid (cholesterol) test  10/06/2015  . Flu Shot  03/26/2016  . Hemoglobin A1C  06/14/2016  . Complete foot exam   10/03/2016  . Mammogram  05/16/2017  . Colon Cancer Screening  06/17/2021  . DEXA scan (bone density measurement)  Completed  .  Hepatitis C: One time screening is recommended by Center for Disease Control  (CDC) for  adults born from [birth year] through 1965.   Completed     Sign up for My Chart to have easy access to your labs results, and communication with your Primary care physician   Please feel free to call with any questions or concerns at any time, at [phone]. --Dr. Raeford Razor

## 2016-02-05 NOTE — Progress Notes (Signed)
   Subjective:    Kaitlyn Ferguson - 70 y.o. female MRN [id number]  Date of birth: January 27, 1946  CC HLD   HPI  Kaitlyn Ferguson is here for HLD.  HYPERLIPIDEMIA Symptoms Chest pain on exertion:  no   . Leg claudication:   no Medications: crestor Compliance- yes  Right upper quadrant pain- no   Muscle aches- no     Component Value Date/Time   CHOL 107* 02/05/2016 1606   TRIG 125 02/05/2016 1606   HDL 45* 02/05/2016 1606   LDLDIRECT 56 08/04/2013 0934   VLDL 25 02/05/2016 1606   CHOLHDL 2.4 02/05/2016 1606   Hypothyroidism:  Unclear of how cause of her low TSH  Unsure of how long she has been taking synthroid  Is asymptomatic  It occurred after she had her last child.   PMH: DM2, HTN, HLD, hypothyroidism  SH: denies any tobacco or alcohol  FH: three of her son's have hypothyroidism.   Health Maintenance:  - lipid panel today   Health Maintenance Due  Topic Date Due  . ZOSTAVAX  04/06/2006  . PNA vac Low Risk Adult (1 of 2 - PCV13) 04/07/2011  . TETANUS/TDAP  10/01/2014  . OPHTHALMOLOGY EXAM  12/25/2014    Review of Systems See HPI     Objective:   Physical Exam BP 141/65 mmHg  Pulse 98  Temp(Src) 98.1 F (36.7 C) (Oral)  Wt 236 lb (107.049 kg) Gen: NAD, alert, cooperative with exam,  HEENT: NCAT, EOMI, clear conjunctiva, supple neck, no thyroid nodules or enlargement  CV: RRR, good S1/S2, no murmur, no edema,   Resp: CTABL, no wheezes, non-labored Skin: well healing scar on left knee  Neuro: no gross deficits.   Assessment & Plan:   Hyperlipidemia Stable and controlled  - continue statin  - lipid panel today    Hypothyroidism She is unclear as the source of her hypothyroidism.  Reports she has been on medicine for thirty years.  - TSH today  - if TSH continues to decrease, may need to decrease her synthroid

## 2016-02-06 DIAGNOSIS — Z471 Aftercare following joint replacement surgery: Secondary | ICD-10-CM | POA: Diagnosis not present

## 2016-02-06 DIAGNOSIS — Z96652 Presence of left artificial knee joint: Secondary | ICD-10-CM | POA: Diagnosis not present

## 2016-02-06 LAB — LIPID PANEL
Cholesterol: 107 mg/dL — ABNORMAL LOW (ref 125–200)
HDL: 45 mg/dL — ABNORMAL LOW (ref >=46)
LDL Cholesterol: 37 mg/dL (ref <130)
Total CHOL/HDL Ratio: 2.4 Ratio (ref <=5.0)
Triglycerides: 125 mg/dL (ref <150)
VLDL: 25 mg/dL (ref <30)

## 2016-02-06 NOTE — Assessment & Plan Note (Signed)
Stable and controlled  - continue statin  - lipid panel today

## 2016-02-06 NOTE — Assessment & Plan Note (Signed)
She is unclear as the source of her hypothyroidism.  Reports she has been on medicine for thirty years.  - TSH today  - if TSH continues to decrease, may need to decrease her synthroid

## 2016-02-07 DIAGNOSIS — M25562 Pain in left knee: Secondary | ICD-10-CM | POA: Diagnosis not present

## 2016-02-07 DIAGNOSIS — G8929 Other chronic pain: Secondary | ICD-10-CM | POA: Diagnosis not present

## 2016-02-09 DIAGNOSIS — G8929 Other chronic pain: Secondary | ICD-10-CM | POA: Diagnosis not present

## 2016-02-09 DIAGNOSIS — M25562 Pain in left knee: Secondary | ICD-10-CM | POA: Diagnosis not present

## 2016-02-12 ENCOUNTER — Telehealth: Payer: Self-pay | Admitting: Family Medicine

## 2016-02-12 ENCOUNTER — Other Ambulatory Visit: Payer: Self-pay | Admitting: *Deleted

## 2016-02-12 DIAGNOSIS — M25562 Pain in left knee: Secondary | ICD-10-CM | POA: Diagnosis not present

## 2016-02-12 DIAGNOSIS — G8929 Other chronic pain: Secondary | ICD-10-CM | POA: Diagnosis not present

## 2016-02-12 MED ORDER — LISINOPRIL-HYDROCHLOROTHIAZIDE 20-12.5 MG PO TABS: ORAL_TABLET | ORAL | Status: DC

## 2016-02-12 MED ORDER — AMLODIPINE BESYLATE 5 MG PO TABS: 5.0000 mg | ORAL_TABLET | Freq: Every day | ORAL | Status: DC

## 2016-02-12 NOTE — Telephone Encounter (Signed)
Discussed results with patient and she elects to continue her current dose of synthroid and we will keep an eye on it.  Will re-check in 6 months.   Rosemarie Ax, MD PGY-3, [facility] 02/12/2016, 10:02 AM

## 2016-02-14 ENCOUNTER — Telehealth: Payer: Self-pay | Admitting: Family Medicine

## 2016-02-14 NOTE — Telephone Encounter (Signed)
[employer]: a RX for diabetic shoes was faxed here on June 6 and June 14.  Willette Cluster would like to know the status of the prescription

## 2016-02-16 NOTE — Telephone Encounter (Signed)
Spoke to [place] with [employer]. She is needing a Rx and the form for the shoes the pt received in February.  Ottis Stain, CMA

## 2016-02-19 DIAGNOSIS — M25562 Pain in left knee: Secondary | ICD-10-CM | POA: Diagnosis not present

## 2016-02-19 DIAGNOSIS — G8929 Other chronic pain: Secondary | ICD-10-CM | POA: Diagnosis not present

## 2016-02-20 ENCOUNTER — Ambulatory Visit: Payer: PPO | Admitting: Pharmacist

## 2016-02-22 NOTE — Telephone Encounter (Signed)
Spoke with rep from [employer] and ask to resend rx.   Rosemarie Ax, MD PGY-3, [facility] 02/22/2016, 8:37 AM

## 2016-02-23 DIAGNOSIS — M25562 Pain in left knee: Secondary | ICD-10-CM | POA: Diagnosis not present

## 2016-02-23 DIAGNOSIS — G8929 Other chronic pain: Secondary | ICD-10-CM | POA: Diagnosis not present

## 2016-02-23 NOTE — Telephone Encounter (Signed)
Paperwork completed.   Rosemarie Ax, MD PGY-3, [facility] 02/23/2016, 11:23 AM

## 2016-02-28 DIAGNOSIS — G8929 Other chronic pain: Secondary | ICD-10-CM | POA: Diagnosis not present

## 2016-02-28 DIAGNOSIS — M25562 Pain in left knee: Secondary | ICD-10-CM | POA: Diagnosis not present

## 2016-03-01 DIAGNOSIS — G8929 Other chronic pain: Secondary | ICD-10-CM | POA: Diagnosis not present

## 2016-03-01 DIAGNOSIS — M25562 Pain in left knee: Secondary | ICD-10-CM | POA: Diagnosis not present

## 2016-03-11 DIAGNOSIS — H353221 Exudative age-related macular degeneration, left eye, with active choroidal neovascularization: Secondary | ICD-10-CM | POA: Diagnosis not present

## 2016-03-14 DIAGNOSIS — H3530 Unspecified macular degeneration: Secondary | ICD-10-CM | POA: Diagnosis not present

## 2016-03-14 DIAGNOSIS — H43819 Vitreous degeneration, unspecified eye: Secondary | ICD-10-CM | POA: Diagnosis not present

## 2016-03-14 DIAGNOSIS — H538 Other visual disturbances: Secondary | ICD-10-CM | POA: Diagnosis not present

## 2016-03-14 DIAGNOSIS — H5010 Unspecified exotropia: Secondary | ICD-10-CM | POA: Diagnosis not present

## 2016-04-18 ENCOUNTER — Other Ambulatory Visit: Payer: Self-pay | Admitting: *Deleted

## 2016-04-23 DIAGNOSIS — H353221 Exudative age-related macular degeneration, left eye, with active choroidal neovascularization: Secondary | ICD-10-CM | POA: Diagnosis not present

## 2016-04-30 NOTE — Progress Notes (Signed)
Subjective:    Patient ID: Kaitlyn Ferguson , female   DOB: 1946/02/02 , 70 y.o..   MRN: [id number]  HPI  Kaitlyn Ferguson is here for Follow-up.  Chronic Diabetes  Disease Monitoring  Blood Sugar Ranges: Has not been checking because she can't get her meter to work  Polyuria: no   Visual problems: no   Medication Compliance: yes  Medication Side Effects  Hypoglycemia: no  Preventitive Health Care  Eye Exam: 12/24/2013  Foot Exam: 10/04/15  Diet pattern: Has not been compliant with diabetic diet  Exercise: Minimal due to recent knee repair   Right Shoulder Pain Started in right shoulder, "throbbing" pain, has been going on for a couple years Has previously got better with silver sneakers, but got wrose I nthe last 6 months It is hard to put her bra on The strap across her shoulder makes it sore It is hard for her to lay on that side Can't use her left shoulder as well Numbness in lateral 3 fingers Admits to right beck pain pain Most painful movements: all Has tried Advil when pain gets really bad, has helped a little bit Soreness does not go away Has not had any shoulder XRAYs Has never had any shoulder injections before She has previously had injections in her knee    Review of Systems: Per HPI. All other systems reviewed and are negative.  Past Medical History: Patient Active Problem List   Diagnosis Date Noted  . Primary osteoarthritis of left knee 12/21/2015  . Preoperative clearance 11/24/2015  . History of ankle fracture 10/04/2015  . Left knee pain 08/10/2015  . Right shoulder pain 08/10/2015  . Dizziness 05/16/2015  . Cervical strain 02/28/2015  . Diabetes mellitus without complication ([place]) [id number]  . Hyperlipidemia 03/18/2007  . Hypothyroidism 10/23/2006  . OBESITY, NOS 10/23/2006  . HYPERTENSION, BENIGN SYSTEMIC 10/23/2006  . ARTHRITIS, DEGENERATIVE 10/23/2006    Medications: reviewed and updated Current Outpatient Prescriptions    Medication Sig Dispense Refill  . amLODipine (NORVASC) 5 MG tablet Take 1 tablet (5 mg total) by mouth daily. 30 tablet 3  . aspirin EC 81 MG tablet Take 81 mg by mouth daily.    . B-D ULTRA-FINE 33 LANCETS MISC Check BID.  Supplies sufficient for one month 100 each 11  . Insulin Glargine (LANTUS SOLOSTAR) 100 UNIT/ML Solostar Pen inject 60 units subcutaneously every morning 45 mL 1  . Insulin Pen Needle (B-D UF III MINI PEN NEEDLES) 31G X 5 MM MISC Dispense qt sufficient for two shots daily.  Supplies sufficient for one month. 100 each 11  . levothyroxine (SYNTHROID, LEVOTHROID) 112 MCG tablet take 1 tablet by mouth once daily 90 tablet 3  . Liraglutide 18 MG/3ML SOPN Inject 0.3 mLs (1.8 mg total) into the skin daily. 3 mL 11  . lisinopril-hydrochlorothiazide (PRINZIDE,ZESTORETIC) 20-12.5 MG tablet take 1 tablets by mouth once daily 90 tablet 1  . metFORMIN (GLUCOPHAGE) 500 MG tablet Take 1-1.5 tablets (500-750 mg total) by mouth 2 (two) times daily. Pt takes 500 mg in the Am and 750 mg at night 60 tablet 1  . omeprazole (PRILOSEC) 40 MG capsule Take 40 mg by mouth daily.    . rosuvastatin (CRESTOR) 20 MG tablet take 1 tablet by mouth once daily 30 tablet 3  . Blood Glucose Monitoring Suppl (ACCU-CHEK AVIVA) device Use as instructed 1 each 0  . docusate sodium (COLACE) 100 MG capsule Take 1 capsule (100 mg total) by mouth 2 (two)  times daily. (Patient not taking: Reported on 05/01/2016) 60 capsule 3  . gabapentin (NEURONTIN) 100 MG capsule Take 1 capsule (100 mg total) by mouth 3 (three) times daily. 90 capsule 3  . senna (SENOKOT) 8.6 MG TABS tablet Take 2 tablets (17.2 mg total) by mouth at bedtime. (Patient not taking: Reported on 05/01/2016) 60 each 3  . spironolactone (ALDACTONE) 25 MG tablet take 1/2 tablet by mouth once daily (Patient not taking: Reported on 05/01/2016) 30 tablet 1   No current facility-administered medications for this visit.     Social Hx:  reports that she has never  smoked. She has never used smokeless tobacco.    Objective:   BP 126/72   Pulse 83   Temp 98.4 F (36.9 C) (Oral)   Ht 5\' 7"  (1.702 m)   Wt 246 lb (111.6 kg)   BMI 38.53 kg/m  Physical Exam  Gen: NAD, alert, cooperative with exam, well-appearing HEENT: NCAT, PERRL, clear conjunctiva, oropharynx clear, supple neck Cardiac: Regular rate and rhythm, normal S1/S2, no murmur, no edema, capillary refill brisk  Respiratory: Clear to auscultation bilaterally, no wheezes, non-labored breathing Gastrointestinal: soft, non tender, non distended, bowel sounds present Skin: no rashes, normal turgor  Neurological: no gross deficits.  Psych: good insight, normal mood and affect MSK: Neck: Inspection: Mild neck kyphosis, no muscular atrophy  Palpation: No midline cervical tenderness or muscle spasms. Some tenderness to palpation right paraspinal. Range of Motion: Full range of motion. Normal neck flexion, extension, lateral movements, and side bending Strength: 5/5 flexion, extension, lateral movements, and side bending. 5/5 upper extremity strength bilaterally.  Sensation: Gross sensation intact in neck and upper extremities Reflexes: Biceps 2+ bilaterally, triceps 2+ bilaterally, brachioradialis 2+ bilaterally Negative Spurlings  Right Shoulder: Inspection reveals no abnormalities, atrophy or asymmetry. Palpation reveals tenderness over AC joint  ROM is limited. Difficulty with active abduction past 90. Full passive range of motion. Rotator cuff strength normal throughout. Positive Hawkin's tests, empty can sign. Neurovascularly intact   Assessment & Plan:  Diabetes mellitus without complication (HCC) Mild increase in hemoglobin A1c, 7.6 today and was previously 7.5.  -Continue 500 mg metformin in the morning and 750 mg at night -Provided prescription for meter so patient can take her fasting glucose in the morning -Continue Lantus, she is taking 62 units daily -Discussed  hypoglycemic symptoms and reasons to go to the hospital -Counseled on low carbohydrate diet and stopping sugary beverages -She is due for her diabetic eye exam will discuss at next visit -Follow-up in one month  Right shoulder pain Suspicious for arthritis versus rotator cuff tendinopathy. Shoulder pain could be developing due to the neck pain. Additionally having numbness sensation in right hand digits 3 through 5 although neurovascularly intact on exam. -Advil when necessary for pain -Start gabapentin 100 mg 3 times a day for paresthesias -Obtain x-ray of cervical spine and right shoulder -May consider physical therapy and or sports medicine/orthopedic referral in the future. -Follow-up in one month    Smitty Cords, MD [place], PGY-2

## 2016-05-01 ENCOUNTER — Encounter: Payer: Self-pay | Admitting: Family Medicine

## 2016-05-01 ENCOUNTER — Other Ambulatory Visit: Payer: Self-pay | Admitting: Family Medicine

## 2016-05-01 ENCOUNTER — Ambulatory Visit (INDEPENDENT_AMBULATORY_CARE_PROVIDER_SITE_OTHER): Payer: PPO | Admitting: Family Medicine

## 2016-05-01 VITALS — BP 126/72 | HR 83 | Temp 98.4°F | Ht 67.0 in | Wt 246.0 lb

## 2016-05-01 DIAGNOSIS — M25511 Pain in right shoulder: Secondary | ICD-10-CM

## 2016-05-01 DIAGNOSIS — M542 Cervicalgia: Secondary | ICD-10-CM

## 2016-05-01 DIAGNOSIS — E119 Type 2 diabetes mellitus without complications: Secondary | ICD-10-CM | POA: Diagnosis not present

## 2016-05-01 DIAGNOSIS — Z1231 Encounter for screening mammogram for malignant neoplasm of breast: Secondary | ICD-10-CM

## 2016-05-01 LAB — POCT GLYCOSYLATED HEMOGLOBIN (HGB A1C): Hemoglobin A1C: 7.6

## 2016-05-01 MED ORDER — GABAPENTIN 100 MG PO CAPS: 100.0000 mg | ORAL_CAPSULE | Freq: Three times a day (TID) | ORAL | 3 refills | Status: DC

## 2016-05-01 MED ORDER — ACCU-CHEK AVIVA DEVI: 0 refills | Status: AC

## 2016-05-01 NOTE — Assessment & Plan Note (Signed)
Suspicious for arthritis versus rotator cuff tendinopathy. Shoulder pain could be developing due to the neck pain. Additionally having numbness sensation in right hand digits 3 through 5 although neurovascularly intact on exam. -Advil when necessary for pain -Start gabapentin 100 mg 3 times a day for paresthesias -Obtain x-ray of cervical spine and right shoulder -May consider physical therapy and or sports medicine/orthopedic referral in the future. -Follow-up in one month

## 2016-05-01 NOTE — Assessment & Plan Note (Signed)
Mild increase in hemoglobin A1c, 7.6 today and was previously 7.5.  -Continue 500 mg metformin in the morning and 750 mg at night -Provided prescription for meter so patient can take her fasting glucose in the morning -Continue Lantus, she is taking 62 units daily -Discussed hypoglycemic symptoms and reasons to go to the hospital -Counseled on low carbohydrate diet and stopping sugary beverages -She is due for her diabetic eye exam will discuss at next visit -Follow-up in one month

## 2016-05-01 NOTE — Patient Instructions (Signed)
Thank you for coming in today, it was so nice to see you! Today we talked about:    Right shoulder pain: Continue taking Advil as prescribed on the bottle for pain. We will also start a medication called Gabapentin, take this at night before bed as it can make you feel a little tired. We will get Xrays of your neck and shoulder.  Diabetes: Continue taking your medications as prescribed and check your sugar in the morning before breakfast. Keep a log of your sugar and we will review it next appointment.  Blood pressure: we will keep your medications as they are. If you can, try to check your blood pressure at home   Please follow up in 1 month. You can schedule this appointment at the front desk before you leave or call the clinic.  Bring in all your medications or supplements to each appointment for review.   If we ordered any tests today, you will be notified via telephone of any abnormalities. If everything is normal you will get a letter in the mail.   If you have any questions or concerns, please do not hesitate to call the office at [phone]. You can also message me directly via MyChart.   Sincerely,  Smitty Cords, MD

## 2016-05-02 ENCOUNTER — Telehealth: Payer: Self-pay | Admitting: Family Medicine

## 2016-05-02 NOTE — Telephone Encounter (Signed)
pts insurance will cover Precision and Freestyle glucometers.  [facility] on [address]

## 2016-05-03 ENCOUNTER — Ambulatory Visit
Admission: RE | Admit: 2016-05-03 | Discharge: 2016-05-03 | Disposition: A | Discharge status: Home / Self Care | Payer: PPO | Source: Ambulatory Visit | Attending: Family Medicine | Admitting: Family Medicine

## 2016-05-03 DIAGNOSIS — M542 Cervicalgia: Secondary | ICD-10-CM | POA: Diagnosis not present

## 2016-05-03 DIAGNOSIS — M19011 Primary osteoarthritis, right shoulder: Secondary | ICD-10-CM | POA: Diagnosis not present

## 2016-05-03 DIAGNOSIS — E119 Type 2 diabetes mellitus without complications: Secondary | ICD-10-CM

## 2016-05-06 NOTE — Telephone Encounter (Signed)
Called pharmacy, prescribed Freestyle glucometer. Also called the patient to discuss her XRAYs, no answer. Left voicemail. Will attempt to call later.   Smitty Cords, MD [place], PGY-2

## 2016-05-07 ENCOUNTER — Telehealth: Payer: Self-pay | Admitting: Family Medicine

## 2016-05-07 NOTE — Telephone Encounter (Signed)
Would like result from shoulder exray last week

## 2016-05-08 NOTE — Telephone Encounter (Signed)
Attempt #2 to call patient back. Called her home and cell phone without any answers. No option to leave voicemail. If patient calls back can you inform her that her XRAYs showed arthritis in her neck and shoulder. Thank you.

## 2016-05-16 ENCOUNTER — Encounter: Payer: Self-pay | Admitting: Family Medicine

## 2016-05-20 ENCOUNTER — Other Ambulatory Visit: Payer: Self-pay | Admitting: *Deleted

## 2016-05-20 MED ORDER — ROSUVASTATIN CALCIUM 20 MG PO TABS: ORAL_TABLET | ORAL | 3 refills | Status: DC

## 2016-05-21 ENCOUNTER — Ambulatory Visit: Payer: PPO

## 2016-05-22 ENCOUNTER — Other Ambulatory Visit: Payer: Self-pay | Admitting: *Deleted

## 2016-05-23 ENCOUNTER — Ambulatory Visit (INDEPENDENT_AMBULATORY_CARE_PROVIDER_SITE_OTHER): Payer: PPO | Admitting: Family Medicine

## 2016-05-23 ENCOUNTER — Encounter: Payer: Self-pay | Admitting: Family Medicine

## 2016-05-23 VITALS — BP 140/66 | HR 73 | Temp 98.0°F | Ht 67.0 in | Wt 248.0 lb

## 2016-05-23 DIAGNOSIS — M25511 Pain in right shoulder: Secondary | ICD-10-CM

## 2016-05-23 DIAGNOSIS — E119 Type 2 diabetes mellitus without complications: Secondary | ICD-10-CM | POA: Diagnosis not present

## 2016-05-23 DIAGNOSIS — I10 Essential (primary) hypertension: Secondary | ICD-10-CM | POA: Diagnosis not present

## 2016-05-23 MED ORDER — LIRAGLUTIDE 18 MG/3ML ~~LOC~~ SOPN: 1.8000 mg | PEN_INJECTOR | Freq: Every day | SUBCUTANEOUS | 11 refills | Status: DC

## 2016-05-23 NOTE — Assessment & Plan Note (Signed)
Controlled and at goal with current medication regimen. Goal blood pressure is less than Q000111Q systolic and less than 90 diastolic per JNC guidelines. -Continue amlodipine and lisinopril hydrochlorothiazide

## 2016-05-23 NOTE — Progress Notes (Signed)
Subjective:    Patient ID: Kaitlyn Ferguson , female   DOB: [birth year] , 70 y.o..   MRN: [id number]  HPI  Kaitlyn Ferguson is here for followup.  R shoulder pain: It's been getting worse. The gabapentin made her nauseous. She tried it for about 1 week. Took the Gabapentin BID, she couldn't tolerate it. Has been taking Advil for pain sometimes. She trys not to take aything. Advil seems to help but not always. Has had some weakness and numbness in her right hand. There is no tingling. Denies any neck pain. Would like a referral to the orthopedic at this time. Has seen orthopedic specialist before for knees. Wants to be sent to [facility], has seen Dr. Lyla Glassing in the past.   Hypertension Blood pressure at home: Checks twice monthly, usually systolic is [id number], once it was 150.  Exercise: Not right now[facility] of grandson,. Wants to go back next week  Low salt diet: Does not add salt to her her food. Occasionally swelling in her right ankle  Medications: Compliant with Amlodipine 5 mg daily, lisinopril hydrochlorothiazide Side effects: None ROS: Denies headache, dizziness, visual changes, nausea, vomiting, chest pain, abdominal pain or shortness of breath. BP Readings from Last 3 Encounters:  05/23/16 140/66  05/01/16 126/72  02/05/16 (!) 141/65    Chronic Diabetes  Disease Monitoring  Blood Sugar Ranges: Checks in the morning 150's-180; at night sometimes in low 200s.   Polyuria: no   Visual problems: no   Medication Compliance: yes. Takes metformin 500 mg in the morning and 750 mg at night, Lantus 60 units every morning and Victoza 1.8 mg daily Medication Side Effects  Hypoglycemia: no   Preventitive Health Care  Eye Exam: Recently had eye exam in July  Foot Exam: Needs foot exam today  Diet pattern: Eating lots of sweets and junk food recently, not interested in meeting with a nutritionist at this time. Patient states that she knows  that she needs to cut back on sweets and junk food and she is going to try.   Exercise: Is going to try going to the [facility] next week   Review of Systems: Per HPI. All other systems reviewed and are negative.   Past Medical History: Patient Active Problem List   Diagnosis Date Noted  . Primary osteoarthritis of left knee 12/21/2015  . Preoperative clearance 11/24/2015  . History of ankle fracture 10/04/2015  . Left knee pain 08/10/2015  . Right shoulder pain 08/10/2015  . Dizziness 05/16/2015  . Cervical strain 02/28/2015  . Diabetes mellitus without complication ([place]) Q000111Q  . Hyperlipidemia 03/18/2007  . Hypothyroidism 10/23/2006  . OBESITY, NOS 10/23/2006  . HYPERTENSION, BENIGN SYSTEMIC 10/23/2006  . ARTHRITIS, DEGENERATIVE 10/23/2006    Medications: reviewed and updated Current Outpatient Prescriptions  Medication Sig Dispense Refill  . amLODipine (NORVASC) 5 MG tablet Take 1 tablet (5 mg total) by mouth daily. 30 tablet 3  . aspirin EC 81 MG tablet Take 81 mg by mouth daily.    . B-D ULTRA-FINE 33 LANCETS MISC Check BID.  Supplies sufficient for one month 100 each 11  . Blood Glucose Monitoring Suppl (ACCU-CHEK AVIVA) device Use as instructed 1 each 0  . Insulin Glargine (LANTUS SOLOSTAR) 100 UNIT/ML Solostar Pen inject 60 units subcutaneously every morning 45 mL 1  . Insulin Pen Needle (B-D UF III MINI PEN NEEDLES) 31G X 5 MM MISC Dispense qt sufficient for two  shots daily.  Supplies sufficient for one month. 100 each 11  . levothyroxine (SYNTHROID, LEVOTHROID) 112 MCG tablet take 1 tablet by mouth once daily 90 tablet 3  . lisinopril-hydrochlorothiazide (PRINZIDE,ZESTORETIC) 20-12.5 MG tablet take 1 tablets by mouth once daily 90 tablet 1  . metFORMIN (GLUCOPHAGE) 500 MG tablet Take 1-1.5 tablets (500-750 mg total) by mouth 2 (two) times daily. Pt takes 500 mg in the Am and 750 mg at night 60 tablet 1  . omeprazole (PRILOSEC) 40 MG capsule Take 40 mg by mouth daily.      . rosuvastatin (CRESTOR) 20 MG tablet take 1 tablet by mouth once daily 30 tablet 3  . Liraglutide 18 MG/3ML SOPN Inject 0.3 mLs (1.8 mg total) into the skin daily. 3 mL 11   No current facility-administered medications for this visit.     Social Hx:  reports that she has never smoked. She has never used smokeless tobacco.    Objective:   BP 140/66 (BP Location: Left Arm, Patient Position: Sitting, Cuff Size: Normal)   Pulse 73   Temp 98 F (36.7 C) (Oral)   Ht 5\' 7"  (1.702 m)   Wt 248 lb (112.5 kg)   BMI 38.84 kg/m  Physical Exam  Gen: NAD, alert, cooperative with exam, well-appearing HEENT: NCAT, PERRL, clear conjunctiva, oropharynx clear, supple neck Cardiac: Regular rate and rhythm, normal [id number], Soft systolic murmur, no edema, capillary refill brisk  Respiratory: Clear to auscultation bilaterally, no wheezes, non-labored breathing Gastrointestinal: soft, non tender, non distended, bowel sounds present Skin: no rashes, normal turgor  Neurological: no gross deficits.  Psych: good insight, normal mood and affect  Diabetic Foot Exam - Simple   Simple Foot Form Diabetic Foot exam was performed with the following findings:  Yes 05/23/2016  4:29 PM  Visual Inspection No deformities, no ulcerations, no other skin breakdown bilaterally:  Yes Sensation Testing Intact to touch and monofilament testing bilaterally:  Yes Pulse Check Posterior Tibialis and Dorsalis pulse intact bilaterally:  Yes Comments      Assessment & Plan:  HYPERTENSION, BENIGN SYSTEMIC Controlled and at goal with current medication regimen. Goal blood pressure is less than Q000111Q systolic and less than 90 diastolic per JNC guidelines. -Continue amlodipine and lisinopril hydrochlorothiazide  Diabetes mellitus without complication (HCC) Uncontrolled. Last hemoglobin A1c was 7.6 on 05/01/2016. Patient notes that her glucose has also been higher than it should be in the mornings (150-180 ). She admits to  eating increased sugar and carbohydrates in suspect this is the cause of her elevated fasting glucose. Diabetic foot exam performed today. -Discussed healthy diabetic diet with patient as well as decreasing amount of sugar and carbohydrates, she expressed good understanding -Continue Lantus 60 units daily, Victoza 1.8 mg daily, metformin 500 mg in the morning and 750 mg at night. -We will recheck hemoglobin A1c in 3 months, if still 7.6 or higher will likely adjust regimen  Right shoulder pain Patient still experiencing pain, did not tolerate gabapentin as she said this made her way too sleepy. Continues to have subjective weakness and numbness in her right hand. Took gabapentin off medication list. Is only taking Advil for pain at this point which provides little to no relief. Right shoulder x-rays earlier this month revealed osteoarthritic change, fairly marked in the glenohumeral joint and moderate in the acromioclavicular joint. Additionally cervical spine x-ray revealed osteoarthritic change at multiple levels with overall progression since 2012. Discussed options with patient and at this time she would like to be  referred to orthopedic specialist. -Referral placed for orthopedic specialist -Continue Advil as needed for pain -Red flag symptoms and return precautions discussed   Smitty Cords, MD [place], PGY-2

## 2016-05-23 NOTE — Patient Instructions (Addendum)
Thank you for coming in today, it was so nice to see you! Today we talked about:    Blood pressure is good  Diabetes: we talked about decreasing sweets. Also try to decrease the amount of carbohydrates in your diet  Shoulder pain: I will place a referral to the orthopedic specialists  Please follow up in 3 months. You can schedule this appointment at the front desk before you leave or call the clinic.  Bring in all your medications or supplements to each appointment for review.   If we ordered any tests today, you will be notified via telephone of any abnormalities. If everything is normal you will get a letter in the mail.   If you have any questions or concerns, please do not hesitate to call the office at [phone]. You can also message me directly via MyChart.   Sincerely,  Smitty Cords, MD  Diet Recommendations for Diabetes   Starchy (carb) foods include: Bread, rice, pasta, potatoes, corn, crackers, bagels, muffins, all baked goods.   Protein foods include: Meat, fish, poultry, eggs, dairy, and beans such as pinto and kidney beans (beans also provide carbohydrate).   1. Eat at least 3 meals and 1-2 snacks per day. Never go more than 4-5 hours while awake without eating.  2. Limit starchy foods to TWO per meal and ONE per snack. ONE portion of a starchy  food is equal to the following:   - ONE slice of bread (or its equivalent, such as half of a hamburger bun).   - 1/2 cup of a "scoopable" starchy food such as potatoes or rice.   - 1 OUNCE (28 grams) of starchy snack foods such as crackers or pretzels (look on label).   - 15 grams of carbohydrate as shown on food label.  3. Both lunch and dinner should include a protein food, a carb food, and vegetables.   - Obtain twice as many vegetables as protein or carbohydrate foods for both lunch and dinner.   - Try to keep frozen vegetables on hand for a quick vegetable serving.     - Fresh or frozen vegetables are best.   4. Breakfast should always include protein.

## 2016-05-23 NOTE — Assessment & Plan Note (Signed)
Uncontrolled. Last hemoglobin A1c was 7.6 on 05/01/2016. Patient notes that her glucose has also been higher than it should be in the mornings (150-180 ). She admits to eating increased sugar and carbohydrates in suspect this is the cause of her elevated fasting glucose. Diabetic foot exam performed today. -Discussed healthy diabetic diet with patient as well as decreasing amount of sugar and carbohydrates, she expressed good understanding -Continue Lantus 60 units daily, Victoza 1.8 mg daily, metformin 500 mg in the morning and 750 mg at night. -We will recheck hemoglobin A1c in 3 months, if still 7.6 or higher will likely adjust regimen

## 2016-05-23 NOTE — Assessment & Plan Note (Signed)
Patient still experiencing pain, did not tolerate gabapentin as she said this made her way too sleepy. Continues to have subjective weakness and numbness in her right hand. Took gabapentin off medication list. Is only taking Advil for pain at this point which provides little to no relief. Right shoulder x-rays earlier this month revealed osteoarthritic change, fairly marked in the glenohumeral joint and moderate in the acromioclavicular joint. Additionally cervical spine x-ray revealed osteoarthritic change at multiple levels with overall progression since 2012. Discussed options with patient and at this time she would like to be referred to orthopedic specialist. -Referral placed for orthopedic specialist -Continue Advil as needed for pain -Red flag symptoms and return precautions discussed

## 2016-05-27 ENCOUNTER — Other Ambulatory Visit: Payer: Self-pay | Admitting: Internal Medicine

## 2016-06-06 DIAGNOSIS — M19011 Primary osteoarthritis, right shoulder: Secondary | ICD-10-CM | POA: Diagnosis not present

## 2016-06-06 DIAGNOSIS — G8929 Other chronic pain: Secondary | ICD-10-CM | POA: Diagnosis not present

## 2016-06-06 DIAGNOSIS — M25511 Pain in right shoulder: Secondary | ICD-10-CM | POA: Diagnosis not present

## 2016-06-07 ENCOUNTER — Ambulatory Visit: Payer: PPO

## 2016-06-10 DIAGNOSIS — M19011 Primary osteoarthritis, right shoulder: Secondary | ICD-10-CM | POA: Diagnosis not present

## 2016-06-11 DIAGNOSIS — H353221 Exudative age-related macular degeneration, left eye, with active choroidal neovascularization: Secondary | ICD-10-CM | POA: Diagnosis not present

## 2016-06-20 ENCOUNTER — Other Ambulatory Visit: Payer: Self-pay | Admitting: *Deleted

## 2016-06-20 MED ORDER — LEVOTHYROXINE SODIUM 112 MCG PO TABS: ORAL_TABLET | ORAL | 3 refills | Status: DC

## 2016-06-20 MED ORDER — INSULIN GLARGINE 100 UNIT/ML SOLOSTAR PEN: PEN_INJECTOR | SUBCUTANEOUS | 1 refills | Status: DC

## 2016-06-27 DIAGNOSIS — H40002 Preglaucoma, unspecified, left eye: Secondary | ICD-10-CM | POA: Diagnosis not present

## 2016-07-02 ENCOUNTER — Encounter (HOSPITAL_COMMUNITY): Payer: Self-pay

## 2016-07-02 ENCOUNTER — Encounter (HOSPITAL_COMMUNITY)
Admission: RE | Admit: 2016-07-02 | Discharge: 2016-07-02 | Disposition: A | Discharge status: Home / Self Care | Payer: PPO | Source: Ambulatory Visit | Attending: Orthopedic Surgery | Admitting: Orthopedic Surgery

## 2016-07-02 DIAGNOSIS — Z01818 Encounter for other preprocedural examination: Secondary | ICD-10-CM | POA: Diagnosis not present

## 2016-07-02 DIAGNOSIS — I10 Essential (primary) hypertension: Secondary | ICD-10-CM | POA: Insufficient documentation

## 2016-07-02 LAB — BASIC METABOLIC PANEL
Anion gap: 9 (ref 5–15)
BUN: 24 mg/dL — ABNORMAL HIGH (ref 6–20)
CO2: 27 mmol/L (ref 22–32)
Calcium: 10 mg/dL (ref 8.9–10.3)
Chloride: 104 mmol/L (ref 101–111)
Creatinine, Ser: 0.8 mg/dL (ref 0.44–1.00)
GFR calc Af Amer: >60 mL/min (ref >60)
GFR calc non Af Amer: >60 mL/min (ref >60)
Glucose, Bld: 187 mg/dL — ABNORMAL HIGH (ref 65–99)
Potassium: 3.7 mmol/L (ref 3.5–5.1)
Sodium: 140 mmol/L (ref 135–145)

## 2016-07-02 LAB — GLUCOSE, CAPILLARY: Glucose-Capillary: 210 mg/dL — ABNORMAL HIGH (ref 65–99)

## 2016-07-02 LAB — CBC
HCT: 39.4 % (ref 36.0–46.0)
Hemoglobin: 12.3 g/dL (ref 12.0–15.0)
MCH: 23.5 pg — ABNORMAL LOW (ref 26.0–34.0)
MCHC: 31.2 g/dL (ref 30.0–36.0)
MCV: 75.2 fL — ABNORMAL LOW (ref 78.0–100.0)
Platelets: 305 10*3/uL (ref 150–400)
RBC: 5.24 MIL/uL — ABNORMAL HIGH (ref 3.87–5.11)
RDW: 17.3 % — ABNORMAL HIGH (ref 11.5–15.5)
WBC: 9.1 10*3/uL (ref 4.0–10.5)

## 2016-07-02 LAB — SURGICAL PCR SCREEN
MRSA, PCR: NEGATIVE
Staphylococcus aureus: NEGATIVE

## 2016-07-02 LAB — HEMOGLOBIN A1C
Hgb A1c MFr Bld: 8.1 % — ABNORMAL HIGH (ref 4.8–5.6)
Mean Plasma Glucose: 186 mg/dL

## 2016-07-02 NOTE — Pre-Procedure Instructions (Signed)
Kaitlyn Ferguson  07/02/2016      [id number], [place] [address] Phone: [phone] Fax: [phone]    Your procedure is scheduled on Jul 11, 2016.  Report to [facility] Admitting at 8 A.M.  Call this number if you have problems the morning of surgery:  [phone]   Remember:  Do not eat food or drink liquids after midnight.  Take these medicines the morning of surgery with A SIP OF WATER :amLODipine (NORVASC), levothyroxine (SYNTHROID,  LEVOTHROID), omeprazole (PRILOSEC)      How to Manage Your Diabetes Before and After Surgery  Why is it important to control my blood sugar before and after surgery? . Improving blood sugar levels before and after surgery helps healing and can limit problems. . A way of improving blood sugar control is eating a healthy diet by: o  Eating less sugar and carbohydrates o  Increasing activity/exercise o  Talking with your doctor about reaching your blood sugar goals . High blood sugars (greater than 180 mg/dL) can raise your risk of infections and slow your recovery, so you will need to focus on controlling your diabetes during the weeks before surgery. . Make sure that the doctor who takes care of your diabetes knows about your planned surgery including the date and location.  How do I manage my blood sugar before surgery? . Check your blood sugar at least 4 times a day, starting 2 days before surgery, to make sure that the level is not too high or low. o Check your blood sugar the morning of your surgery when you wake up and every 2 hours until you get to the Short Stay unit. . If your blood sugar is less than 70 mg/dL, you will need to treat for low blood sugar: o Do not take insulin. o Treat a low blood sugar (less than 70 mg/dL) with  cup of clear juice (cranberry or apple), 4 glucose tablets, OR glucose gel. o Recheck blood sugar in  15 minutes after treatment (to make sure it is greater than 70 mg/dL). If your blood sugar is not greater than 70 mg/dL on recheck, call [phone] for further instructions. . Report your blood sugar to the short stay nurse when you get to Short Stay.  . If you are admitted to the hospital after surgery: o Your blood sugar will be checked by the staff and you will probably be given insulin after surgery (instead of oral diabetes medicines) to make sure you have good blood sugar levels. o The goal for blood sugar control after surgery is 80-180 mg/dL.     WHAT DO I DO ABOUT MY DIABETES MEDICATION?   Marland Kitchen Do not take oral diabetes medicines (pills) the morning of surgery.   . THE MORNING OF SURGERY, take 30 units of lantus insulin.  . The day of surgery, do not take other diabetes injectables, including Byetta (exenatide), Bydureon (exenatide ER), Victoza (liraglutide), or Trulicity (dulaglutide).  . If your CBG is greater than 220 mg/dL, you may take  of your sliding scale (correction) dose of insulin.   Reviewed and Endorsed by [facility] Patient Education Committee, August 2015    Do not wear jewelry, make-up or nail polish.  Do not wear lotions, powders, or perfumes, or deoderant.  Do not shave 48 hours prior to surgery.    Do not bring valuables to the hospital.  [facility] is  not responsible for any belongings or valuables.  Contacts, dentures or bridgework may not be worn into surgery.  Leave your suitcase in the car.  After surgery it may be brought to your room.  For patients admitted to the hospital, discharge time will be determined by your treatment team.  Patients discharged the day of surgery will not be allowed to drive home.   Name and phone number of your driver:      Please read over the following fact sheets that you were given. Pain Booklet and Surgical Site Infection Prevention

## 2016-07-04 DIAGNOSIS — H40031 Anatomical narrow angle, right eye: Secondary | ICD-10-CM | POA: Diagnosis not present

## 2016-07-05 NOTE — Progress Notes (Signed)
Anesthesia chart review: Patient is a 70 year old female scheduled for right total shoulder arthroplasty on 07/11/2016 by Dr. Onnie Graham.  History includes nonsmoker, diabetes mellitus type 2, hyperlipidemia, hypothyroidism, hypertension, nausea (intermittent), hysterectomy, tonsillectomy, cholecystectomy, back surgery 1984, right ankle fusion 1997, neck mass excision '03, left TKA 12/21/15. BMI is consistent with obesity.  PCP is listed as Dr. Smitty Cords ([facility]).  Meds include amlodipine, aspirin 81 mg, Lantus, levothyroxine, liraglutide, lisinopril-HCTZ, metformin, Prilosec, pilocarpine ophthalmic, Crestor.  BP (!) 153/65   Pulse 84   Temp 37 C   Resp 20   Ht 5\' 7"  (1.702 m)   Wt 253 lb 3.2 oz (114.9 kg)   SpO2 98%   BMI 39.66 kg/m   07/02/16 EKG: NSR.  08/28/12 Echo: Study Conclusions - Left ventricle: The cavity size was normal. Wall thickness was increased in a pattern of mild LVH. The estimated ejection fraction was 55%. Wall motion was normal; there were no regional wall motion abnormalities. Doppler parameters are consistent with abnormal left ventricular relaxation (grade 1 diastolic dysfunction). - Aortic valve: There was no stenosis. - Mitral valve: There was no evidence for stenosis. - Right ventricle: The cavity size was normal. Systolic function was normal. - Tricuspid valve: Peak RV-RA gradient: 52mm Hg (S). - Pulmonary arteries: PA peak pressure: 48mm Hg (S). - Inferior vena cava: The vessel was normal in size; the respirophasic diameter changes were in the normal range (= 50%); findings are consistent with normal central venous pressure.  05/27/03  Cardiac cath (Dr. Percival Spanish): CONCLUSION:  1. Normal coronary arteries.  2. Normal left ventricular function.  Preoperative labs noted. A1c 8.1, up from 7.6 on 05/01/16. A1c results routed to Dr. Onnie Graham. Patient will get a fasting CBG on arrival.   If morning of surgery CBG is  acceptable and otherwise no acute changes then I would anticipate that she can proceed as planned.  George Hugh [facility] Short Stay Center/Anesthesiology Phone [phone] 07/05/2016 2:18 PM

## 2016-07-09 ENCOUNTER — Other Ambulatory Visit: Payer: Self-pay | Admitting: Family Medicine

## 2016-07-09 ENCOUNTER — Telehealth: Payer: Self-pay | Admitting: Family Medicine

## 2016-07-09 DIAGNOSIS — Z471 Aftercare following joint replacement surgery: Secondary | ICD-10-CM | POA: Diagnosis not present

## 2016-07-09 DIAGNOSIS — Z96652 Presence of left artificial knee joint: Secondary | ICD-10-CM | POA: Diagnosis not present

## 2016-07-09 MED ORDER — GLUCOSE BLOOD VI STRP
ORAL_STRIP | 12 refills | Status: DC
Start: 2016-07-09 — End: 2016-10-04

## 2016-07-09 NOTE — Telephone Encounter (Signed)
Freestyle Lite test strips prescription sent to pharmacy.

## 2016-07-09 NOTE — Telephone Encounter (Signed)
Freestyle Lite test strips are not on patient's current or historical med list. Verified with patient that this is the glucometer she uses. States insurance would not cover the accu chek glucometer.  Hubbard Hartshorn, RN, BSN

## 2016-07-09 NOTE — Telephone Encounter (Signed)
Needs refill on test strips  Freestyle Lite.  [facility] [address]

## 2016-07-10 MED ORDER — TRANEXAMIC ACID 1000 MG/10ML IV SOLN
1000.0000 mg | INTRAVENOUS | Status: DC
  Filled 2016-07-10: qty 10

## 2016-07-10 MED ORDER — DEXTROSE 5 % IV SOLN
3.0000 g | INTRAVENOUS | Status: DC
  Filled 2016-07-10: qty 3000

## 2016-07-11 ENCOUNTER — Encounter (HOSPITAL_COMMUNITY): Payer: Self-pay | Admitting: *Deleted

## 2016-07-11 ENCOUNTER — Encounter (HOSPITAL_COMMUNITY)
Admission: RE | Disposition: A | Discharge status: Home Health | Payer: Self-pay | Source: Ambulatory Visit | Attending: Orthopedic Surgery

## 2016-07-11 ENCOUNTER — Inpatient Hospital Stay (HOSPITAL_COMMUNITY)
Admission: RE | Admit: 2016-07-11 | Discharge: 2016-07-13 | DRG: 483 | Disposition: A | Discharge status: Home Health | Payer: PPO | Source: Ambulatory Visit | Attending: Orthopedic Surgery | Admitting: Orthopedic Surgery

## 2016-07-11 ENCOUNTER — Inpatient Hospital Stay (HOSPITAL_COMMUNITY): Payer: PPO | Admitting: Vascular Surgery

## 2016-07-11 ENCOUNTER — Inpatient Hospital Stay (HOSPITAL_COMMUNITY): Payer: PPO | Admitting: Anesthesiology

## 2016-07-11 DIAGNOSIS — E119 Type 2 diabetes mellitus without complications: Secondary | ICD-10-CM | POA: Diagnosis not present

## 2016-07-11 DIAGNOSIS — E669 Obesity, unspecified: Secondary | ICD-10-CM | POA: Diagnosis not present

## 2016-07-11 DIAGNOSIS — I10 Essential (primary) hypertension: Secondary | ICD-10-CM | POA: Diagnosis not present

## 2016-07-11 DIAGNOSIS — M19011 Primary osteoarthritis, right shoulder: Principal | ICD-10-CM | POA: Diagnosis present

## 2016-07-11 DIAGNOSIS — Z96652 Presence of left artificial knee joint: Secondary | ICD-10-CM | POA: Diagnosis present

## 2016-07-11 DIAGNOSIS — Z96611 Presence of right artificial shoulder joint: Secondary | ICD-10-CM

## 2016-07-11 DIAGNOSIS — E039 Hypothyroidism, unspecified: Secondary | ICD-10-CM | POA: Diagnosis not present

## 2016-07-11 DIAGNOSIS — E785 Hyperlipidemia, unspecified: Secondary | ICD-10-CM | POA: Diagnosis present

## 2016-07-11 DIAGNOSIS — G8918 Other acute postprocedural pain: Secondary | ICD-10-CM | POA: Diagnosis not present

## 2016-07-11 DIAGNOSIS — Z8249 Family history of ischemic heart disease and other diseases of the circulatory system: Secondary | ICD-10-CM

## 2016-07-11 HISTORY — PX: TOTAL SHOULDER ARTHROPLASTY: SHX126

## 2016-07-11 LAB — GLUCOSE, CAPILLARY
Glucose-Capillary: 158 mg/dL — ABNORMAL HIGH (ref 65–99)
Glucose-Capillary: 130 mg/dL — ABNORMAL HIGH (ref 65–99)
Glucose-Capillary: 155 mg/dL — ABNORMAL HIGH (ref 65–99)

## 2016-07-11 SURGERY — ARTHROPLASTY, SHOULDER, TOTAL
Anesthesia: General | Site: Shoulder | Laterality: Right

## 2016-07-11 MED ORDER — ALUM & MAG HYDROXIDE-SIMETH 200-200-20 MG/5ML PO SUSP: 30.0000 mL | ORAL | Status: DC | PRN

## 2016-07-11 MED ORDER — HYDROMORPHONE HCL 1 MG/ML IJ SOLN: 1.0000 mg | INTRAMUSCULAR | Status: DC | PRN

## 2016-07-11 MED ORDER — ONDANSETRON HCL 4 MG/2ML IJ SOLN
INTRAMUSCULAR | Status: AC
  Filled 2016-07-11: qty 4

## 2016-07-11 MED ORDER — OXYCODONE HCL 5 MG PO TABS
5.0000 mg | ORAL_TABLET | ORAL | Status: DC | PRN
  Administered 2016-07-11 – 2016-07-13 (×7): 10 mg via ORAL
  Filled 2016-07-11 (×7): qty 2

## 2016-07-11 MED ORDER — PHENYLEPHRINE HCL 10 MG/ML IJ SOLN
INTRAVENOUS | Status: DC | PRN
  Administered 2016-07-11: 25 ug/min via INTRAVENOUS

## 2016-07-11 MED ORDER — CEFAZOLIN SODIUM-DEXTROSE 2-4 GM/100ML-% IV SOLN
2.0000 g | Freq: Four times a day (QID) | INTRAVENOUS | Status: AC
  Administered 2016-07-11 – 2016-07-12 (×3): 2 g via INTRAVENOUS
  Filled 2016-07-11 (×3): qty 100

## 2016-07-11 MED ORDER — LIDOCAINE 2% (20 MG/ML) 5 ML SYRINGE
INTRAMUSCULAR | Status: AC
  Filled 2016-07-11: qty 10

## 2016-07-11 MED ORDER — POLYETHYLENE GLYCOL 3350 17 G PO PACK
17.0000 g | PACK | Freq: Every day | ORAL | Status: DC | PRN
  Administered 2016-07-12: 17 g via ORAL
  Filled 2016-07-11: qty 1

## 2016-07-11 MED ORDER — SUGAMMADEX SODIUM 200 MG/2ML IV SOLN
INTRAVENOUS | Status: AC
  Filled 2016-07-11: qty 2

## 2016-07-11 MED ORDER — FENTANYL CITRATE (PF) 100 MCG/2ML IJ SOLN: 100.0000 ug | Freq: Once | INTRAMUSCULAR | Status: DC

## 2016-07-11 MED ORDER — LACTATED RINGERS IV SOLN
INTRAVENOUS | Status: DC
  Administered 2016-07-11 – 2016-07-12 (×2): via INTRAVENOUS

## 2016-07-11 MED ORDER — ACETAMINOPHEN 650 MG RE SUPP: 650.0000 mg | Freq: Four times a day (QID) | RECTAL | Status: DC | PRN

## 2016-07-11 MED ORDER — CHLORHEXIDINE GLUCONATE 4 % EX LIQD: 60.0000 mL | Freq: Once | CUTANEOUS | Status: DC

## 2016-07-11 MED ORDER — PILOCARPINE HCL 2 % OP SOLN
1.0000 [drp] | Freq: Three times a day (TID) | OPHTHALMIC | Status: DC
  Administered 2016-07-12 – 2016-07-13 (×2): 1 [drp] via OPHTHALMIC
  Filled 2016-07-11: qty 15

## 2016-07-11 MED ORDER — METOCLOPRAMIDE HCL 5 MG/ML IJ SOLN: 5.0000 mg | Freq: Three times a day (TID) | INTRAMUSCULAR | Status: DC | PRN

## 2016-07-11 MED ORDER — EPHEDRINE 5 MG/ML INJ
INTRAVENOUS | Status: AC
  Filled 2016-07-11: qty 20

## 2016-07-11 MED ORDER — FLEET ENEMA 7-19 GM/118ML RE ENEM: 1.0000 | ENEMA | Freq: Once | RECTAL | Status: DC | PRN

## 2016-07-11 MED ORDER — BISACODYL 5 MG PO TBEC: 5.0000 mg | DELAYED_RELEASE_TABLET | Freq: Every day | ORAL | Status: DC | PRN

## 2016-07-11 MED ORDER — PROPOFOL 10 MG/ML IV BOLUS
INTRAVENOUS | Status: DC | PRN
  Administered 2016-07-11: 150 mg via INTRAVENOUS

## 2016-07-11 MED ORDER — MENTHOL 3 MG MT LOZG: 1.0000 | LOZENGE | OROMUCOSAL | Status: DC | PRN

## 2016-07-11 MED ORDER — MIDAZOLAM HCL 2 MG/2ML IJ SOLN
INTRAMUSCULAR | Status: AC
  Administered 2016-07-11: 2 mg
  Filled 2016-07-11: qty 2

## 2016-07-11 MED ORDER — PANTOPRAZOLE SODIUM 40 MG PO TBEC
40.0000 mg | DELAYED_RELEASE_TABLET | Freq: Every day | ORAL | Status: DC
  Administered 2016-07-12 – 2016-07-13 (×2): 40 mg via ORAL
  Filled 2016-07-11 (×2): qty 1

## 2016-07-11 MED ORDER — BUPIVACAINE-EPINEPHRINE (PF) 0.5% -1:200000 IJ SOLN
INTRAMUSCULAR | Status: DC | PRN
  Administered 2016-07-11: 30 mL via PERINEURAL

## 2016-07-11 MED ORDER — METOCLOPRAMIDE HCL 5 MG PO TABS: 5.0000 mg | ORAL_TABLET | Freq: Three times a day (TID) | ORAL | Status: DC | PRN

## 2016-07-11 MED ORDER — FENTANYL CITRATE (PF) 100 MCG/2ML IJ SOLN
INTRAMUSCULAR | Status: AC
  Administered 2016-07-11: 100 ug
  Filled 2016-07-11: qty 2

## 2016-07-11 MED ORDER — HYDROMORPHONE HCL 1 MG/ML IJ SOLN: 0.2500 mg | INTRAMUSCULAR | Status: DC | PRN

## 2016-07-11 MED ORDER — MIDAZOLAM HCL 2 MG/2ML IJ SOLN: 2.0000 mg | Freq: Once | INTRAMUSCULAR | Status: DC

## 2016-07-11 MED ORDER — 0.9 % SODIUM CHLORIDE (POUR BTL) OPTIME
TOPICAL | Status: DC | PRN
  Administered 2016-07-11: 1000 mL

## 2016-07-11 MED ORDER — LISINOPRIL 20 MG PO TABS: 20.0000 mg | ORAL_TABLET | Freq: Every day | ORAL | Status: DC

## 2016-07-11 MED ORDER — PHENOL 1.4 % MT LIQD: 1.0000 | OROMUCOSAL | Status: DC | PRN

## 2016-07-11 MED ORDER — LISINOPRIL-HYDROCHLOROTHIAZIDE 20-12.5 MG PO TABS: 1.0000 | ORAL_TABLET | Freq: Every day | ORAL | Status: DC

## 2016-07-11 MED ORDER — LEVOTHYROXINE SODIUM 112 MCG PO TABS
112.0000 ug | ORAL_TABLET | Freq: Every day | ORAL | Status: DC
  Administered 2016-07-12 – 2016-07-13 (×2): 112 ug via ORAL
  Filled 2016-07-11 (×2): qty 1

## 2016-07-11 MED ORDER — SUCCINYLCHOLINE CHLORIDE 20 MG/ML IJ SOLN
INTRAMUSCULAR | Status: DC | PRN
  Administered 2016-07-11: 100 mg via INTRAVENOUS

## 2016-07-11 MED ORDER — DIAZEPAM 5 MG PO TABS
5.0000 mg | ORAL_TABLET | Freq: Four times a day (QID) | ORAL | Status: DC | PRN
  Administered 2016-07-12 (×2): 5 mg via ORAL
  Filled 2016-07-11 (×2): qty 1

## 2016-07-11 MED ORDER — CEFAZOLIN IN D5W 1 GM/50ML IV SOLN
INTRAVENOUS | Status: DC | PRN
  Administered 2016-07-11: 3 g via INTRAVENOUS

## 2016-07-11 MED ORDER — HYDROMORPHONE HCL 2 MG/ML IJ SOLN
1.0000 mg | INTRAMUSCULAR | Status: DC | PRN
  Administered 2016-07-12: 1 mg via INTRAVENOUS
  Filled 2016-07-11: qty 1

## 2016-07-11 MED ORDER — KETOROLAC TROMETHAMINE 15 MG/ML IJ SOLN
7.5000 mg | Freq: Four times a day (QID) | INTRAMUSCULAR | Status: AC
  Administered 2016-07-11 – 2016-07-12 (×4): 7.5 mg via INTRAVENOUS
  Filled 2016-07-11 (×4): qty 1

## 2016-07-11 MED ORDER — AMLODIPINE BESYLATE 5 MG PO TABS
5.0000 mg | ORAL_TABLET | Freq: Every day | ORAL | Status: DC
  Administered 2016-07-12 – 2016-07-13 (×2): 5 mg via ORAL
  Filled 2016-07-11 (×2): qty 1

## 2016-07-11 MED ORDER — ONDANSETRON HCL 4 MG PO TABS: 4.0000 mg | ORAL_TABLET | Freq: Four times a day (QID) | ORAL | Status: DC | PRN

## 2016-07-11 MED ORDER — DIPHENHYDRAMINE HCL 12.5 MG/5ML PO ELIX: 12.5000 mg | ORAL_SOLUTION | ORAL | Status: DC | PRN

## 2016-07-11 MED ORDER — ROSUVASTATIN CALCIUM 10 MG PO TABS
20.0000 mg | ORAL_TABLET | Freq: Every evening | ORAL | Status: DC
  Administered 2016-07-11 – 2016-07-12 (×2): 20 mg via ORAL
  Filled 2016-07-11 (×2): qty 2

## 2016-07-11 MED ORDER — PHENYLEPHRINE 40 MCG/ML (10ML) SYRINGE FOR IV PUSH (FOR BLOOD PRESSURE SUPPORT)
PREFILLED_SYRINGE | INTRAVENOUS | Status: AC
  Filled 2016-07-11: qty 10

## 2016-07-11 MED ORDER — ARTIFICIAL TEARS OP OINT
TOPICAL_OINTMENT | OPHTHALMIC | Status: AC
  Filled 2016-07-11: qty 3.5

## 2016-07-11 MED ORDER — ROCURONIUM BROMIDE 100 MG/10ML IV SOLN
INTRAVENOUS | Status: DC | PRN
  Administered 2016-07-11: 50 mg via INTRAVENOUS

## 2016-07-11 MED ORDER — DOCUSATE SODIUM 100 MG PO CAPS
100.0000 mg | ORAL_CAPSULE | Freq: Two times a day (BID) | ORAL | Status: DC
  Administered 2016-07-11 – 2016-07-13 (×4): 100 mg via ORAL
  Filled 2016-07-11 (×3): qty 1

## 2016-07-11 MED ORDER — TRANEXAMIC ACID 1000 MG/10ML IV SOLN
INTRAVENOUS | Status: DC | PRN
  Administered 2016-07-11: 1000 mg via INTRAVENOUS

## 2016-07-11 MED ORDER — PROPOFOL 10 MG/ML IV BOLUS
INTRAVENOUS | Status: AC
  Filled 2016-07-11: qty 20

## 2016-07-11 MED ORDER — ASPIRIN EC 81 MG PO TBEC
81.0000 mg | DELAYED_RELEASE_TABLET | Freq: Every day | ORAL | Status: DC
  Administered 2016-07-11 – 2016-07-13 (×3): 81 mg via ORAL
  Filled 2016-07-11 (×3): qty 1

## 2016-07-11 MED ORDER — LISINOPRIL 20 MG PO TABS
20.0000 mg | ORAL_TABLET | Freq: Every day | ORAL | Status: DC
  Administered 2016-07-11 – 2016-07-13 (×3): 20 mg via ORAL
  Filled 2016-07-11 (×3): qty 1

## 2016-07-11 MED ORDER — ACETAMINOPHEN 325 MG PO TABS: 650.0000 mg | ORAL_TABLET | Freq: Four times a day (QID) | ORAL | Status: DC | PRN

## 2016-07-11 MED ORDER — HYDROCHLOROTHIAZIDE 12.5 MG PO CAPS: 12.5000 mg | ORAL_CAPSULE | Freq: Every day | ORAL | Status: DC

## 2016-07-11 MED ORDER — LIDOCAINE HCL (CARDIAC) 20 MG/ML IV SOLN
INTRAVENOUS | Status: DC | PRN
  Administered 2016-07-11: 60 mg via INTRATRACHEAL

## 2016-07-11 MED ORDER — SUCCINYLCHOLINE CHLORIDE 200 MG/10ML IV SOSY
PREFILLED_SYRINGE | INTRAVENOUS | Status: AC
  Filled 2016-07-11: qty 10

## 2016-07-11 MED ORDER — HYDROCHLOROTHIAZIDE 12.5 MG PO CAPS
12.5000 mg | ORAL_CAPSULE | Freq: Every day | ORAL | Status: DC
  Administered 2016-07-11 – 2016-07-13 (×3): 12.5 mg via ORAL
  Filled 2016-07-11 (×3): qty 1

## 2016-07-11 MED ORDER — FENTANYL CITRATE (PF) 100 MCG/2ML IJ SOLN
INTRAMUSCULAR | Status: AC
  Filled 2016-07-11: qty 2

## 2016-07-11 MED ORDER — INSULIN ASPART 100 UNIT/ML ~~LOC~~ SOLN
0.0000 [IU] | Freq: Three times a day (TID) | SUBCUTANEOUS | Status: DC
Start: 2016-07-11 — End: 2016-07-13
  Administered 2016-07-11 – 2016-07-12 (×2): 3 [IU] via SUBCUTANEOUS
  Administered 2016-07-12: 5 [IU] via SUBCUTANEOUS
  Administered 2016-07-12: 3 [IU] via SUBCUTANEOUS
  Administered 2016-07-13: 5 [IU] via SUBCUTANEOUS

## 2016-07-11 MED ORDER — ROCURONIUM BROMIDE 10 MG/ML (PF) SYRINGE
PREFILLED_SYRINGE | INTRAVENOUS | Status: AC
  Filled 2016-07-11: qty 20

## 2016-07-11 MED ORDER — ONDANSETRON HCL 4 MG/2ML IJ SOLN: 4.0000 mg | Freq: Four times a day (QID) | INTRAMUSCULAR | Status: DC | PRN

## 2016-07-11 MED ORDER — FENTANYL CITRATE (PF) 100 MCG/2ML IJ SOLN
INTRAMUSCULAR | Status: DC | PRN
  Administered 2016-07-11: 100 ug via INTRAVENOUS

## 2016-07-11 MED ORDER — LACTATED RINGERS IV SOLN
INTRAVENOUS | Status: DC
  Administered 2016-07-11: 10:00:00 via INTRAVENOUS

## 2016-07-11 SURGICAL SUPPLY — 64 items
ADH SKN CLS APL DERMABOND .7 (GAUZE/BANDAGES/DRESSINGS) ×1
ADH SKN CLS LQ APL DERMABOND (GAUZE/BANDAGES/DRESSINGS) ×1
AID PSTN UNV HD RSTRNT DISP (MISCELLANEOUS) ×1
BLADE SAW SGTL 83.5X18.5 (BLADE) ×2 IMPLANT
CEMENT BONE DEPUY (Cement) ×2 IMPLANT
COVER SURGICAL LIGHT HANDLE (MISCELLANEOUS) ×2 IMPLANT
DERMABOND ADHESIVE PROPEN (GAUZE/BANDAGES/DRESSINGS) ×1
DERMABOND ADVANCED (GAUZE/BANDAGES/DRESSINGS) ×1
DERMABOND ADVANCED .7 DNX12 (GAUZE/BANDAGES/DRESSINGS) IMPLANT
DERMABOND ADVANCED .7 DNX6 (GAUZE/BANDAGES/DRESSINGS) ×1 IMPLANT
DRAPE ORTHO SPLIT 77X108 STRL (DRAPES) ×4
DRAPE SURG 17X11 SM STRL (DRAPES) ×2 IMPLANT
DRAPE SURG ORHT 6 SPLT 77X108 (DRAPES) ×2 IMPLANT
DRAPE U-SHAPE 47X51 STRL (DRAPES) ×2 IMPLANT
DRSG AQUACEL AG ADV 3.5X10 (GAUZE/BANDAGES/DRESSINGS) ×2 IMPLANT
DURAPREP 26ML APPLICATOR (WOUND CARE) ×2 IMPLANT
ELECT BLADE 4.0 EZ CLEAN MEGAD (MISCELLANEOUS) ×2
ELECT CAUTERY BLADE 6.4 (BLADE) ×2 IMPLANT
ELECT REM PT RETURN 9FT ADLT (ELECTROSURGICAL) ×2
ELECTRODE BLDE 4.0 EZ CLN MEGD (MISCELLANEOUS) ×1 IMPLANT
ELECTRODE REM PT RTRN 9FT ADLT (ELECTROSURGICAL) ×1 IMPLANT
FACESHIELD WRAPAROUND (MASK) ×6 IMPLANT
FACESHIELD WRAPAROUND OR TEAM (MASK) ×3 IMPLANT
GLENOID WITH CLEAT MEDIUM (Shoulder) ×1 IMPLANT
GLOVE BIO SURGEON STRL SZ7.5 (GLOVE) ×2 IMPLANT
GLOVE BIO SURGEON STRL SZ8 (GLOVE) ×2 IMPLANT
GLOVE EUDERMIC 7 POWDERFREE (GLOVE) ×2 IMPLANT
GLOVE SS BIOGEL STRL SZ 7.5 (GLOVE) ×1 IMPLANT
GLOVE SUPERSENSE BIOGEL SZ 7.5 (GLOVE) ×1
GOWN STRL REUS W/ TWL LRG LVL3 (GOWN DISPOSABLE) ×1 IMPLANT
GOWN STRL REUS W/ TWL XL LVL3 (GOWN DISPOSABLE) ×2 IMPLANT
GOWN STRL REUS W/TWL LRG LVL3 (GOWN DISPOSABLE) ×2
GOWN STRL REUS W/TWL XL LVL3 (GOWN DISPOSABLE) ×4
HEAD HUMERAL UNI II 46/18 (Head) ×1 IMPLANT
KIT BASIN OR (CUSTOM PROCEDURE TRAY) ×2 IMPLANT
KIT ROOM TURNOVER OR (KITS) ×2 IMPLANT
KIT SET UNIVERSAL (KITS) ×1 IMPLANT
MANIFOLD NEPTUNE II (INSTRUMENTS) ×2 IMPLANT
NDL SUT .5 MAYO 1.404X.05X (NEEDLE) ×1 IMPLANT
NDL SUT 6 .5 CRC .975X.05 MAYO (NEEDLE) ×1 IMPLANT
NEEDLE MAYO TAPER (NEEDLE) ×4
NS IRRIG 1000ML POUR BTL (IV SOLUTION) ×2 IMPLANT
PACK SHOULDER (CUSTOM PROCEDURE TRAY) ×2 IMPLANT
PAD ARMBOARD 7.5X6 YLW CONV (MISCELLANEOUS) ×4 IMPLANT
RESTRAINT HEAD UNIVERSAL NS (MISCELLANEOUS) ×2 IMPLANT
SLING ARM FOAM STRAP LRG (SOFTGOODS) IMPLANT
SLING ARM XL FOAM STRAP (SOFTGOODS) ×2 IMPLANT
SMARTMIX MINI TOWER (MISCELLANEOUS) ×2
SPONGE LAP 18X18 X RAY DECT (DISPOSABLE) ×2 IMPLANT
SPONGE LAP 4X18 X RAY DECT (DISPOSABLE) ×2 IMPLANT
STEM HUMERAL UNI APEX 10MM (Shoulder) ×1 IMPLANT
SUCTION FRAZIER HANDLE 10FR (MISCELLANEOUS) ×1
SUCTION TUBE FRAZIER 10FR DISP (MISCELLANEOUS) ×1 IMPLANT
SUT FIBERWIRE #2 38 T-5 BLUE (SUTURE) ×8
SUT MNCRL AB 3-0 PS2 18 (SUTURE) ×2 IMPLANT
SUT MON AB 2-0 CT1 36 (SUTURE) ×2 IMPLANT
SUT VIC AB 1 CT1 27 (SUTURE) ×2
SUT VIC AB 1 CT1 27XBRD ANBCTR (SUTURE) ×1 IMPLANT
SUTURE FIBERWR #2 38 T-5 BLUE (SUTURE) ×4 IMPLANT
SYR CONTROL 10ML LL (SYRINGE) IMPLANT
TOWEL OR 17X24 6PK STRL BLUE (TOWEL DISPOSABLE) ×2 IMPLANT
TOWEL OR 17X26 10 PK STRL BLUE (TOWEL DISPOSABLE) ×2 IMPLANT
TOWER SMARTMIX MINI (MISCELLANEOUS) ×1 IMPLANT
WATER STERILE IRR 1000ML POUR (IV SOLUTION) ×2 IMPLANT

## 2016-07-11 NOTE — Anesthesia Procedure Notes (Signed)
Procedure Name: Intubation Date/Time: 07/11/2016 10:34 AM Performed by: Mariea Clonts Pre-anesthesia Checklist: Patient identified, Emergency Drugs available, Suction available and Patient being monitored Patient Re-evaluated:Patient Re-evaluated prior to inductionOxygen Delivery Method: Circle System Utilized Preoxygenation: Pre-oxygenation with 100% oxygen Intubation Type: IV induction Ventilation: Mask ventilation without difficulty Laryngoscope Size: Glidescope Grade View: Grade I Tube type: Oral Number of attempts: 1 Airway Equipment and Method: Stylet,  Oral airway and Video-laryngoscopy Placement Confirmation: ETT inserted through vocal cords under direct vision,  positive ETCO2 and breath sounds checked- equal and bilateral Tube secured with: Tape Dental Injury: Teeth and Oropharynx as per pre-operative assessment

## 2016-07-11 NOTE — Anesthesia Preprocedure Evaluation (Addendum)
Anesthesia Evaluation  Patient identified by MRN, date of birth, ID band Patient awake    Reviewed: Allergy & Precautions, NPO status , Patient's Chart, lab work & pertinent test results  Airway Mallampati: II  TM Distance: >3 FB     Dental   Pulmonary    breath sounds clear to auscultation       Cardiovascular hypertension,  Rhythm:Regular Rate:Normal     Neuro/Psych  Neuromuscular disease    GI/Hepatic negative GI ROS, Neg liver ROS,   Endo/Other  diabetesHypothyroidism   Renal/GU negative Renal ROS     Musculoskeletal  (+) Arthritis ,   Abdominal   Peds  Hematology   Anesthesia Other Findings   Reproductive/Obstetrics                             Anesthesia Physical Anesthesia Plan  ASA: III  Anesthesia Plan: General   Post-op Pain Management:  Regional for Post-op pain   Induction: Intravenous  Airway Management Planned: Oral ETT  Additional Equipment:   Intra-op Plan:   Post-operative Plan: Extubation in OR  Informed Consent: I have reviewed the patients History and Physical, chart, labs and discussed the procedure including the risks, benefits and alternatives for the proposed anesthesia with the patient or authorized representative who has indicated his/her understanding and acceptance.   Dental advisory given  Plan Discussed with: CRNA and Anesthesiologist  Anesthesia Plan Comments:         Anesthesia Quick Evaluation

## 2016-07-11 NOTE — Anesthesia Postprocedure Evaluation (Signed)
Anesthesia Post Note  Patient: Kaitlyn Ferguson  Procedure(s) Performed: Procedure(s) (LRB): RIGHT TOTAL SHOULDER ARTHROPLASTY (Right)  Patient location during evaluation: PACU Anesthesia Type: General and Regional Level of consciousness: awake and alert Pain management: pain level controlled Vital Signs Assessment: post-procedure vital signs reviewed and stable Respiratory status: spontaneous breathing, nonlabored ventilation, respiratory function stable and patient connected to nasal cannula oxygen Cardiovascular status: blood pressure returned to baseline and stable Postop Assessment: no signs of nausea or vomiting Anesthetic complications: no    Last Vitals:  Vitals:   07/11/16 1500 07/11/16 2001  BP: (!) 162/70 127/69  Pulse: 75 85  Resp: 16 16  Temp: 36.3 C 37.1 C    Last Pain:  Vitals:   07/11/16 2001  TempSrc: Oral  PainSc:                  Catalina Gravel

## 2016-07-11 NOTE — Transfer of Care (Signed)
Immediate Anesthesia Transfer of Care Note  Patient: Kirklyn Sun Hennigan  Procedure(s) Performed: Procedure(s): RIGHT TOTAL SHOULDER ARTHROPLASTY (Right)  Patient Location: PACU  Anesthesia Type:General and GA combined with regional for post-op pain  Level of Consciousness: awake, alert  and oriented  Airway & Oxygen Therapy: Patient Spontanous Breathing and Patient connected to nasal cannula oxygen  Post-op Assessment: Report given to RN, Post -op Vital signs reviewed and stable and Patient moving all extremities X 4  Post vital signs: Reviewed and stable  Last Vitals:  Vitals:   07/11/16 0850  BP: (!) 147/66  Pulse: 68  Resp: 20  Temp: 37 C    Last Pain: There were no vitals filed for this visit.    Patients Stated Pain Goal: 5 ([id number])  Complications: No apparent anesthesia complications

## 2016-07-11 NOTE — H&P (Signed)
[place]    Chief Complaint: Right Shoulder OA HPI: The patient is a 70 y.o. female with end stage right shoulder OA  Past Medical History:  Diagnosis Date  . Arthritis   . Diabetes mellitus   . DJD (degenerative joint disease) 10/07/2008  . DJD (degenerative joint disease)   . Family history of adverse reaction to anesthesia    MOTHER WAS DIFFICULT TO WAKE UP  . Hyperlipidemia   . Hypertension   . Hypothyroidism   . Nausea    INTERMITANT - TAKES PRILOSEC (DENIES REFLUX)  . Obesity   . Osteomyelitis of ankle ([facility])    Complications of Right ankle injury    Past Surgical History:  Procedure Laterality Date  . ABDOMINAL HYSTERECTOMY    . ANKLE FUSION  02/1996   Right ankle tib/talar  . BACK SURGERY  1984  . CARPAL TUNNEL RELEASE  1993   Right arm  . CHOLECYSTECTOMY    . EYE SURGERY Left   . NECK MASS EXCISION  12/29/2001  . right eye laser    . TONSILLECTOMY     Childhood  . TOTAL KNEE ARTHROPLASTY Left 12/21/2015   Procedure: LEFT TOTAL KNEE ARTHROPLASTY WITH COMPLEX COMPUTER NAVIGATION ;  Surgeon: Rod Can, MD;  Location: [facility];  Service: Orthopedics;  Laterality: Left;    Family History  Problem Relation Age of Onset  . Diabetes Brother   . Diabetes Sister     DM2-lost weight-now okay  . Heart attack Father     First MI- early 63's  . Stroke Mother   . Heart failure Mother   . Multiple myeloma Mother     Deceased at 21 due to same    Social History:  reports that she has never smoked. She has never used smokeless tobacco. She reports that she does not drink alcohol or use drugs.   Medications Prior to Admission  Medication Sig Dispense Refill  . amLODipine (NORVASC) 5 MG tablet take 1 tablet by mouth once daily 30 tablet 3  . aspirin EC 81 MG tablet Take 81 mg by mouth daily.    . B-D ULTRA-FINE 33 LANCETS MISC Check BID.  Supplies sufficient for one month 100 each 11  . Blood Glucose Monitoring Suppl (ACCU-CHEK AVIVA) device Use as  instructed 1 each 0  . glucose blood (FREESTYLE LITE) test strip Use as instructed 100 each 12  . Insulin Glargine (LANTUS SOLOSTAR) 100 UNIT/ML Solostar Pen inject 60 units subcutaneously every morning 45 mL 1  . Insulin Pen Needle (B-D UF III MINI PEN NEEDLES) 31G X 5 MM MISC Dispense qt sufficient for two shots daily.  Supplies sufficient for one month. 100 each 11  . levothyroxine (SYNTHROID, LEVOTHROID) 112 MCG tablet take 1 tablet by mouth once daily (Patient taking differently: Take 112 mcg by mouth daily before breakfast. ) 90 tablet 3  . Liraglutide 18 MG/3ML SOPN Inject 0.3 mLs (1.8 mg total) into the skin daily. 3 mL 11  . lisinopril-hydrochlorothiazide (PRINZIDE,ZESTORETIC) 20-12.5 MG tablet take 1 tablets by mouth once daily 90 tablet 1  . metFORMIN (GLUCOPHAGE) 500 MG tablet Take 1-1.5 tablets (500-750 mg total) by mouth 2 (two) times daily. Pt takes 500 mg in the Am and 750 mg at night (Patient taking differently: Take 500 mg by mouth 2 (two) times daily. ) 60 tablet 1  . Multiple Vitamin (MULTIVITAMIN WITH MINERALS) TABS tablet Take 1 tablet by mouth daily. With Vitamin D3    . omeprazole (PRILOSEC)  40 MG capsule Take 40 mg by mouth daily.    . pilocarpine (PILOCAR) 2 % ophthalmic solution Place 1 drop into the left eye 3 (three) times daily.    . rosuvastatin (CRESTOR) 20 MG tablet take 1 tablet by mouth once daily (Patient taking differently: Take 20 mg by mouth every evening. ) 30 tablet 3  . ibuprofen (ADVIL,MOTRIN) 200 MG tablet Take 200 mg by mouth every 8 (eight) hours as needed (for general aches/pain.).       Physical Exam: Right shoulder with painful and restricted motion as noted at recent office visits  Vitals  Temp:  [98.6 F (37 C)] 98.6 F (37 C) (11/16 0850) Pulse Rate:  [68] 68 (11/16 0850) Resp:  [20] 20 (11/16 0850) BP: (147)/(66) 147/66 (11/16 0850) SpO2:  [97 %] 97 % (11/16 0850)  Assessment/Plan  Impression: Right Shoulder OA  Plan of Action:  Procedure(s): RIGHT TOTAL SHOULDER ARTHROPLASTY  Tasnia Spegal M Halford Goetzke 07/11/2016, 9:48 AM Contact # [phone]

## 2016-07-11 NOTE — Anesthesia Procedure Notes (Addendum)
Anesthesia Regional Block:  Interscalene brachial plexus block  Pre-Anesthetic Checklist: ,, timeout performed, Correct Patient, Correct Site, Correct Laterality, Correct Procedure, Correct Position, site marked, Risks and benefits discussed, pre-op evaluation,  At surgeon's request and post-op pain management  Laterality: Right  Prep: Maximum Sterile Barrier Precautions used, chloraprep       Needles:  Injection technique: Single-shot  Needle Type: Echogenic Stimulator Needle     Needle Length: 5cm 5 cm Needle Gauge: 22 and 22 G    Additional Needles:  Procedures: ultrasound guided (picture in chart) and nerve stimulator Interscalene brachial plexus block  Nerve Stimulator or Paresthesia:  Response: Biceps response,   Additional Responses:   Narrative:  Start time: 07/11/2016 9:15 AM End time: 07/11/2016 9:25 AM Injection made incrementally with aspirations every 5 mL. Anesthesiologist: Roderic Palau  Additional Notes: 2% Lidocaine skin wheel.

## 2016-07-11 NOTE — Op Note (Signed)
07/11/2016  12:22 PM  PATIENT:   Kaitlyn Ferguson  70 y.o. female  PRE-OPERATIVE DIAGNOSIS:  Right Shoulder OA  POST-OPERATIVE DIAGNOSIS:  same  PROCEDURE:  R TSA #10 stem, 46x18 head, medium glenoid  SURGEON:  Hayla Hinger, Metta Clines M.D.  ASSISTANTS: Shuford pac   ANESTHESIA:   GET + ISB  EBL: 150  SPECIMEN:  none  Drains: none   PATIENT DISPOSITION:  PACU - hemodynamically stable.    PLAN OF CARE: Admit for overnight observation  Dictation# [id number]   Contact # [phone]

## 2016-07-11 NOTE — Discharge Instructions (Signed)

## 2016-07-11 NOTE — Progress Notes (Signed)
Resting quietly in bed, denies need for pain interventions. No obvious discomfort noted. C/o numbness at r hand, minimal grip strength. Asked to hold p.o. meds until supper tray arrives. Son at bedside. Denies need to void at this time.

## 2016-07-12 LAB — GLUCOSE, CAPILLARY
Glucose-Capillary: 168 mg/dL — ABNORMAL HIGH (ref 65–99)
Glucose-Capillary: 198 mg/dL — ABNORMAL HIGH (ref 65–99)
Glucose-Capillary: 206 mg/dL — ABNORMAL HIGH (ref 65–99)
Glucose-Capillary: 248 mg/dL — ABNORMAL HIGH (ref 65–99)

## 2016-07-12 MED ORDER — ONDANSETRON HCL 4 MG PO TABS: 4.0000 mg | ORAL_TABLET | Freq: Three times a day (TID) | ORAL | 0 refills | Status: DC | PRN

## 2016-07-12 MED ORDER — OXYCODONE-ACETAMINOPHEN 5-325 MG PO TABS: 1.0000 | ORAL_TABLET | ORAL | 0 refills | Status: DC | PRN

## 2016-07-12 MED ORDER — DIAZEPAM 5 MG PO TABS: 2.5000 mg | ORAL_TABLET | Freq: Four times a day (QID) | ORAL | 1 refills | Status: DC | PRN

## 2016-07-12 NOTE — Discharge Summary (Signed)
PATIENT ID:      Kaitlyn Ferguson  MRN:     [id number] DOB/AGE:    Apr 27, 1946 / 70 y.o.     DISCHARGE SUMMARY  ADMISSION DATE:    07/11/2016 DISCHARGE DATE:    ADMISSION DIAGNOSIS: Right Shoulder OA Past Medical History:  Diagnosis Date  . Arthritis   . Diabetes mellitus   . DJD (degenerative joint disease) 10/07/2008  . DJD (degenerative joint disease)   . Family history of adverse reaction to anesthesia    MOTHER WAS DIFFICULT TO WAKE UP  . Hyperlipidemia   . Hypertension   . Hypothyroidism   . Nausea    INTERMITANT - TAKES PRILOSEC (DENIES REFLUX)  . Obesity   . Osteomyelitis of ankle ([place])    Complications of Right ankle injury    DISCHARGE DIAGNOSIS:   Active Problems:   S/P shoulder replacement, right   PROCEDURE: Procedure(s): RIGHT TOTAL SHOULDER ARTHROPLASTY on 07/11/2016  CONSULTS:    HISTORY:  See H&P in chart.  HOSPITAL COURSE:  Kaitlyn Ferguson is a 70 y.o. admitted on 07/11/2016 with a diagnosis of Right Shoulder OA.  They were brought to the operating room on 07/11/2016 and underwent Procedure(s): RIGHT TOTAL SHOULDER ARTHROPLASTY.    They were given perioperative antibiotics: Anti-infectives    Start     Dose/Rate Route Frequency Ordered Stop   07/11/16 1430  ceFAZolin (ANCEF) IVPB 2g/100 mL premix     2 g 200 mL/hr over 30 Minutes Intravenous Every 6 hours 07/11/16 1351 07/12/16 0309   07/10/16 0930  ceFAZolin (ANCEF) 3 g in dextrose 5 % 50 mL IVPB  Status:  Discontinued     3 g 130 mL/hr over 30 Minutes Intravenous To Short Stay 07/10/16 0929 07/11/16 1351    .  Patient underwent the above named procedure and tolerated it well. The following day they were hemodynamically stable and pain was controlled on oral analgesics. They were neurovascularly intact to the operative extremity. OT was ordered and worked with patient per protocol. They were medically and orthopaedically stable for discharge on day 1. Home health was  arranged.   DIAGNOSTIC STUDIES:  RECENT RADIOGRAPHIC STUDIES :  No results found.  RECENT VITAL SIGNS:  Patient Vitals for the past 24 hrs:  BP Temp Temp src Pulse Resp SpO2  07/12/16 0515 (!) 129/57 100 F (37.8 C) Oral 82 16 94 %  07/11/16 2001 127/69 98.8 F (37.1 C) Oral 85 16 91 %  07/11/16 1500 (!) 162/70 97.4 F (36.3 C) Oral 75 16 98 %  07/11/16 1320 (!) 164/72 97.5 F (36.4 C) - 73 16 96 %  07/11/16 1305 (!) 160/76 - - 85 20 97 %  07/11/16 1250 (!) 138/98 - - 81 (!) 26 (!) 89 %  07/11/16 1235 (!) 169/91 97.3 F (36.3 C) - 86 19 99 %  07/11/16 0850 (!) 147/66 98.6 F (37 C) - 68 20 97 %  .  RECENT EKG RESULTS:    Orders placed or performed during the hospital encounter of 07/02/16  . EKG 12 lead  . EKG 12 lead    DISCHARGE INSTRUCTIONS:    DISCHARGE MEDICATIONS:     Medication List    TAKE these medications   ACCU-CHEK AVIVA device Use as instructed   amLODipine 5 MG tablet Commonly known as:  NORVASC take 1 tablet by mouth once daily   aspirin EC 81 MG tablet Take 81 mg by mouth daily.   B-D ULTRA-FINE 33 LANCETS  Misc Check BID.  Supplies sufficient for one month   diazepam 5 MG tablet Commonly known as:  VALIUM Take 0.5-1 tablets (2.5-5 mg total) by mouth every 6 (six) hours as needed for muscle spasms or sedation.   glucose blood test strip Commonly known as:  FREESTYLE LITE Use as instructed   ibuprofen 200 MG tablet Commonly known as:  ADVIL,MOTRIN Take 200 mg by mouth every 8 (eight) hours as needed (for general aches/pain.).   Insulin Glargine 100 UNIT/ML Solostar Pen Commonly known as:  LANTUS SOLOSTAR inject 60 units subcutaneously every morning   Insulin Pen Needle 31G X 5 MM Misc Commonly known as:  B-D UF III MINI PEN NEEDLES Dispense qt sufficient for two shots daily.  Supplies sufficient for one month.   levothyroxine 112 MCG tablet Commonly known as:  SYNTHROID, LEVOTHROID take 1 tablet by mouth once daily What  changed:  how much to take  how to take this  when to take this  additional instructions   liraglutide 18 MG/3ML Sopn Inject 0.3 mLs (1.8 mg total) into the skin daily.   lisinopril-hydrochlorothiazide 20-12.5 MG tablet Commonly known as:  PRINZIDE,ZESTORETIC take 1 tablets by mouth once daily   metFORMIN 500 MG tablet Commonly known as:  GLUCOPHAGE Take 1-1.5 tablets (500-750 mg total) by mouth 2 (two) times daily. Pt takes 500 mg in the Am and 750 mg at night What changed:  how much to take  additional instructions   multivitamin with minerals Tabs tablet Take 1 tablet by mouth daily. With Vitamin D3   omeprazole 40 MG capsule Commonly known as:  PRILOSEC Take 40 mg by mouth daily.   ondansetron 4 MG tablet Commonly known as:  ZOFRAN Take 1 tablet (4 mg total) by mouth every 8 (eight) hours as needed for nausea or vomiting.   oxyCODONE-acetaminophen 5-325 MG tablet Commonly known as:  PERCOCET Take 1-2 tablets by mouth every 4 (four) hours as needed.   pilocarpine 2 % ophthalmic solution Commonly known as:  PILOCAR Place 1 drop into the left eye 3 (three) times daily.   rosuvastatin 20 MG tablet Commonly known as:  CRESTOR take 1 tablet by mouth once daily What changed:  how much to take  how to take this  when to take this  additional instructions       FOLLOW UP VISIT:   Follow-up Information    Metta Clines SUPPLE, MD.   Specialty:  Orthopedic Surgery Why:  call to be seen in 10-14 days Contact information: [address] [place] [address] [id number]           DISCHARGE TO: Home  DISPOSITION: Good  DISCHARGE CONDITION:  Festus Barren for Dr. Justice Britain 07/12/2016, 8:03 AM    Discharge held due to oversedation and safety concerns. The following morning she had cleared up and family states someone will be in home with her. She is stable for discharge today on 07/13/16.  Kaitlyn Ferguson

## 2016-07-12 NOTE — Op Note (Signed)
Kaitlyn Ferguson, Kaitlyn Ferguson           ACCOUNT NO.:  [id number]  MEDICAL RECORD NO.:  [id number]  LOCATION:  [id number]                        FACILITY:  [facility]  PHYSICIAN:  Metta Clines. Esther Bradstreet, M.D.  DATE OF BIRTH:  10/08/1945  DATE OF PROCEDURE:  07/11/2016 DATE OF DISCHARGE:                              OPERATIVE REPORT   PREOPERATIVE DIAGNOSIS:  End-stage right shoulder osteoarthritis.  POSTOPERATIVE DIAGNOSIS:  End-stage right shoulder osteoarthritis.  PROCEDURE:  Right total shoulder arthroplasty utilizing a press-fit size 10 Arthrex stem with a 46 x 18 eccentric humeral head and a cemented pegged medium glenoid.  SURGEON:  Metta Clines. Navaeh Kehres, M.D.  Terrence DupontOlivia Mackie A. Shuford, P.A.-C.  ANESTHESIA:  General endotracheal as well as an interscalene block.  ESTIMATED BLOOD LOSS:  150 mL.  DRAINS:  None.  HISTORY:  Kaitlyn Ferguson is a 70 year old female, who has had chronic and progressive increasing right shoulder pain related to end-stage osteoarthritis.  Due to her ongoing pain and increasing functional limitations, she is brought to the operating room at this time for planned right total shoulder arthroplasty.  Preoperatively, I counseled Kaitlyn Ferguson regarding treatment options, potential risks versus benefits thereof.  Possible surgical complications were all reviewed including bleeding, infection, neurovascular injury, persistent pain, loss of motion, anesthetic complication, failure of the implant and possible need for additional surgery.  She understands and accepts and agrees with our planned procedure.  PROCEDURE IN DETAIL:  After undergoing routine preop evaluation, the patient received prophylactic antibiotics, an interscalene block was established in the holding area by the Anesthesia Department.  Placed supine on the operating table, underwent smooth induction of a general endotracheal anesthesia.  Placed in the beach-chair position and appropriately padded and  protected.  The right shoulder girdle region was then sterilely prepped and draped in standard fashion.  Time-out was called.  An anterior deltopectoral approach to the right shoulder was made through an 8-cm incision.  Skin flaps were elevated. Electrocautery was used for hemostasis.  Dissection carried deeply, deltopectoral interval was then developed bluntly.  Upper centimeter of the pec major was tenotomized to enhance exposure.  Dissection carried beneath the deltoid, releasing adhesions.  The conjoined tendon was mobilized and retracted medially.  We then unroofed the long-head biceps tendon, this was tenotomized for later tenodesis.  The rotator cuff was then divided from the apex of the bicipital groove towards the base of the coracoid and then separated from the lesser tuberosity leaving a 1- cm cuff for later repair and the free margin was tagged with a series of three #2 FiberWire grasping sutures.  We then divided the capsular attachment of the anteroinferior and inferior aspects of the humeral neck allowing delivery of the head through the wound.  We outlined our proposed humeral head resection using the extramedullary guide and performed this resection with an oscillating saw, carefully protecting the surrounding soft tissues.  At this point, the osteophytes on the margin of the humeral neck were then removed with a rongeur.  We then hand-reamed the canal up to size 7 and broached it up to size 10, maintaining the native retroversion approximately 30 degrees.  At this point, we placed a trial stem into the canal with  metal cap to protect the metaphysis of the humerus.  We then exposed the glenoid with combination of Fukuda, pitchfork and snake tongue retractors.  I performed a circumferential labral resection, gained complete visualization of periphery of the glenoid and confirmed we had mobilized the subscapularis.  Once we had complete visualization, we placed a guidepin  into the center of the glenoid after we sized it at medium. Medium reamer was introduced creating a stable subchondral bony bed, placed a central drill hole followed by the superior-inferior peg and slots respectively and the trial glenoid showed excellent fit.  The glenoid was then irrigated, cleaned and dried.  Cement was mixed and introduced into the superior and inferior peg and slot respectively. Glenoid was then impacted with excellent fit and fixation.  We then turned our attention to the proximal humerus where the canal was irrigated, cleaned and dried.  We introduced the size 10 stem.  This was then impacted with excellent interference, fit and fixation.  We then tightened the proximal locking screws and then performed a series of trial reductions, ultimately felt that the 56 x 18 head showed the best soft tissue balance and good coverage and good soft tissue stability. At this point, with the trials were removed, we cleaned the [facility] taper, the final head was impacted.  Final reduction was performed.  Again overall, soft tissue balance was much to our satisfaction, allowing 50% translation of the humeral head and glenoid.  At this point, the subscapularis was then repaired back to the lesser tuberosity through the cuff of tissue using the #2 FiberWires.  We also repaired the rotator interval with a pair of figure-of-eight #2 FiberWire sutures. The biceps tendon was then tenodesed at the upper border of the pec major with #2 FiberWire and the remaining proximal segment of the biceps was then excised.  We then copiously irrigated the wound once again, obtain hemostasis.  The deltopectoral interval was then reapproximated with a series of figure-of-eight #1 Vicryl sutures.  2-0 Monocryl was used for the subcu layer, intracuticular 3-0 Monocryl for the skin followed by Dermabond and Aquacel dressing.  The shoulder showed excellent motion, easily externally rotated to 45 degrees.  The  right arm was then placed into a sling.  The patient was awakened, extubated and taken to the recovery room in stable condition.  Jenetta Loges, PA-C, was used as an Environmental consultant throughout this case, essential for help with positioning the patient, positioning the extremity, management of the retractors, tissue manipulation, implantation of the prosthesis, wound closure and intraoperative decision making.     Metta Clines. Macaela Presas, M.D.     KMS/MEDQ  D:  07/11/2016  T:  07/12/2016  Job:  [place]:[id number]

## 2016-07-12 NOTE — Evaluation (Addendum)
Physical Therapy Evaluation Patient Details Name: Kaitlyn Ferguson MRN: [id number] DOB: 07-12-1946 Today's Date: 07/12/2016   History of Present Illness  Pt is a 70 y.o. female s/p R total shoulder arthroplasty. Pt has a past medical history significant for diabetes mellitus, arthritis, hyperlipidemia, hypertension, hypothyroidism,osteomyelitis of R ankle, R ankle fusion, back surgery, and L TKA.  Clinical Impression  Pt is POD #1 and is very unsteady on her feet.  She requires one person mod assist for balance during gait.  She would benefit from initial 24/7 assist for mobility until she gets a bit more steady on her feet and independent.  Please order max HH services including PT/OT and an aide.   PT to follow acutely for deficits listed below.       Follow Up Recommendations Home health PT;Supervision for mobility/OOB (HHOT, [place] aide, pt refusing to consider SNF)    Equipment Recommendations  None recommended by PT    Recommendations for Other Services   NA    Precautions / Restrictions Precautions Precautions: Shoulder Type of Shoulder Precautions: Passive protocol R shoulder: sling (may come out when in controlled sitting environment and for ADL/exercises); NWB R UE; PROM forward flexion 0-90, abduction 0-60, external rotation 0-30; No AROM. Shoulder Interventions: Shoulder sling/immobilizer;Off for dressing/bathing/exercises (May remove in controlled sitting environment) Precaution Booklet Issued: Yes (comment) Precaution Comments: Reviewed handout and passive shoulder protocol as detailed above. Required Braces or Orthoses: Sling (R shoulder) Restrictions Weight Bearing Restrictions: Yes RUE Weight Bearing: Non weight bearing      Mobility  Bed Mobility     General bed mobility comments: Pt OOB in chair.    Transfers Overall transfer level: Needs assistance Equipment used: 1 person hand held assist Transfers: Sit to/from Stand Sit to Stand: Min assist          General transfer comment: Min assist to support trunk during transitions.  Verbal cues for safe hand placement.  Pt reliant on hand and legs against sitting surface for balance and control. Multiple attempts before successful stand achieved.   Ambulation/Gait Ambulation/Gait assistance: Mod assist Ambulation Distance (Feet): 75 Feet Assistive device: 1 person hand held assist Gait Pattern/deviations: Step-through pattern;Antalgic (stiff legged gait) Gait velocity: decreased Gait velocity interpretation: <1.8 ft/sec, indicative of risk for recurrent falls General Gait Details: Pt with mod hand held assist for balance during gait.  "I feel like I am drunk".  Pt reports at baseline she doesn't use an assistive device, but is often stiff when getting up and moving in the AM (h/o multiple ankle surgeries and low back surgery). She is very unsteady on her feet at this time and I advised her to have 24/7 help when she d/c home, not just intermittent.  She reported she understood and would call her family to see who could come be with her as she transitioned home.          Balance Overall balance assessment: Needs assistance Sitting-balance support: Feet supported;Single extremity supported Sitting balance-Leahy Scale: Fair     Standing balance support: Single extremity supported Standing balance-Leahy Scale: Poor Standing balance comment: cannot stand unassisted at this time.                              Pertinent Vitals/Pain Pain Assessment: 0-10 Pain Score: 7  Pain Location: right shoulder Pain Descriptors / Indicators: Aching;Burning Pain Intervention(s): Limited activity within patient's tolerance;Monitored during session;Repositioned;Ice applied    Home Living Family/patient  expects to be discharged to:: Private residence Living Arrangements: Alone Available Help at Discharge: Family;Available PRN/intermittently (Available at night and intermittently during the  day.) Type of Home: Apartment Home Access: Stairs to enter   Entrance Stairs-Number of Steps: 1 Home Layout: One level Home Equipment: Bedside commode;Adaptive equipment;Walker - 2 wheels;Cane - single point      Prior Function Level of Independence: Independent         Comments: Per pt report h/o falls since multiple ankle surgeries     Hand Dominance   Dominant Hand: Right    Extremity/Trunk Assessment       Lower Extremity Assessment: Generalized weakness RLE Deficits / Details: No dorsiflexion/plantarflexion due to ankle fusion.    Cervical / Trunk Assessment: Other exceptions  Communication   No difficulties.  Cognition Arousal/Alertness: Lethargic;Suspect due to medications Behavior During Therapy: [facility] for tasks assessed/performed Overall Cognitive Status: Within Functional Limits for tasks assessed                             Assessment/Plan    PT Assessment Patient needs continued PT services  PT Problem List Decreased strength;Decreased activity tolerance;Decreased balance;Decreased mobility;Decreased range of motion;Decreased knowledge of use of DME;Decreased safety awareness;Pain;Obesity          PT Treatment Interventions DME instruction;Gait training;Stair training;Functional mobility training;Therapeutic activities;Therapeutic exercise;Balance training;Patient/family education;Modalities    PT Goals (Current goals can be found in the Care Plan section)  Acute Rehab PT Goals Patient Stated Goal: to go home PT Goal Formulation: With patient Time For Goal Achievement: 07/19/16 Potential to Achieve Goals: Good    Frequency Min 5X/week   Barriers to discharge Decreased caregiver support right now, pt does not have 24/7 assist arranged.        End of Session Equipment Utilized During Treatment: Gait belt;Other (comment) (right arm sling) Activity Tolerance: Patient limited by lethargy Patient left: in chair;with call bell/phone  within reach           Time: 1229-1245 PT Time Calculation (min) (ACUTE ONLY): 16 min   Charges:   PT Evaluation $PT Eval Moderate Complexity: 1 Procedure          Tyresse Jayson B. Kortnee Bas, PT, DPT [phone]   07/12/2016, 1:02 PM

## 2016-07-12 NOTE — Evaluation (Signed)
Occupational Therapy Evaluation Patient Details Name: Kaitlyn Ferguson MRN: [id number] DOB: 1946/02/14 Today's Date: 07/12/2016    History of Present Illness Pt is a 70 y.o. female s/p R total shoulder arthroplasty. Pt has a past medical history significant for diabetes mellitus, arthritis, hyperlipidemia, hypertension, hypothyroidism,osteomyelitis of R ankle, R ankle fusion, back surgery, and L TKA.   Clinical Impression   PTA, pt was independent with ADL and IADL. Pt currently requires mod assist for UB ADL and min assist for LB ADL as well as mod 1 person hand held assist for functional mobility to maintain balance. Instructed pt on passive protocol HEP for R shoulder as detailed below as well as elbow/wrist/hand AROM with handouts provided. Pt would benefit from continued OT services while admitted to improve independence with ADL and reinforce education concerning passive shoulder protocol. Pt lives alone and reports will have various family members available for assistance. Pt unsure if they will be able to provide 24 hour assistance. Recommend home health OT services for OT follow-up and 3-in-1 [facility] for DME needs. OT will continue to follow acutely.    Follow Up Recommendations  Home health OT;Supervision/Assistance - 24 hour    Equipment Recommendations  3 in 1 bedside comode    Recommendations for Other Services       Precautions / Restrictions Precautions Precautions: Shoulder Type of Shoulder Precautions: Passive protocol R shoulder: sling (may come out when in controlled sitting environment and for ADL/exercises); NWB R UE; PROM forward flexion 0-90, abduction 0-60, external rotation 0-30; No AROM. Shoulder Interventions: Shoulder sling/immobilizer;Off for dressing/bathing/exercises (May remove in controlled sitting environment) Precaution Booklet Issued: Yes (comment) Precaution Comments: Reviewed handout and passive shoulder protocol as detailed above. Required Braces or  Orthoses: Sling (R shoulder) Restrictions Weight Bearing Restrictions: Yes RUE Weight Bearing: Non weight bearing      Mobility Bed Mobility Overal bed mobility: Needs Assistance Bed Mobility: Rolling;Sidelying to Sit Rolling: Min assist Sidelying to sit: Mod assist       General bed mobility comments: Mod assist for log roll technique to prevent shoulder movement.  Transfers Overall transfer level: Needs assistance Equipment used: 1 person hand held assist Transfers: Sit to/from Stand Sit to Stand: Min assist              Balance Overall balance assessment: Needs assistance Sitting-balance support: No upper extremity supported;Feet supported Sitting balance-Leahy Scale: Fair     Standing balance support: Single extremity supported;During functional activity Standing balance-Leahy Scale: Poor                              ADL Overall ADL's : Needs assistance/impaired     Grooming: Wash/dry hands;Standing;Minimal assistance   Upper Body Bathing: Minimal assitance;Sitting;Cueing for UE precautions   Lower Body Bathing: Minimal assistance;Sit to/from stand   Upper Body Dressing : Moderate assistance;Sitting;Cueing for UE precautions   Lower Body Dressing: Minimal assistance;Sit to/from stand   Toilet Transfer: Moderate assistance (One person hand held assist)   Toileting- Clothing Manipulation and Hygiene: Sit to/from stand;Minimal assistance       Functional mobility during ADLs: Minimal assistance;Moderate assistance (Min to mod assist at times with one hand physical assist.) General ADL Comments: Pt highly unsteady with functional mobility and requires assist to maintain balance. Pt educated on dressing/bathing techniuqes, passive protocol HEP, fall prevention, and use of ice for pain management.     Vision Vision Assessment?: No apparent visual deficits  Perception     Praxis      Pertinent Vitals/Pain Pain Assessment: 0-10 Pain  Score: 5  Pain Location: R shoulder Pain Descriptors / Indicators: Aching;Discomfort;Grimacing;Operative site guarding;Sore Pain Intervention(s): Limited activity within patient's tolerance;Monitored during session;Repositioned;Ice applied     Hand Dominance Right   Extremity/Trunk Assessment Upper Extremity Assessment Upper Extremity Assessment: RUE deficits/detail RUE Deficits / Details: Post-op pain, decreased strength and decreased ROM as expected. RUE: Unable to fully assess due to pain;Unable to fully assess due to immobilization   Lower Extremity Assessment Lower Extremity Assessment: Generalized weakness;RLE deficits/detail RLE Deficits / Details: No dorsiflexion/plantarflexion due to ankle fusion.       Communication Communication Communication: No difficulties   Cognition Arousal/Alertness: Awake/alert Behavior During Therapy: WFL for tasks assessed/performed Overall Cognitive Status: Within Functional Limits for tasks assessed                     General Comments       Exercises Exercises: Shoulder     Shoulder Instructions Shoulder Instructions Donning/doffing shirt without moving shoulder: Moderate assistance Method for sponge bathing under operated UE: Minimal assistance Donning/doffing sling/immobilizer: Moderate assistance Correct positioning of sling/immobilizer: Moderate assistance Pendulum exercises (written home exercise program): Minimal assistance ROM for elbow, wrist and digits of operated UE: Supervision/safety Sling wearing schedule (on at all times/off for ADL's): Supervision/safety Proper positioning of operated UE when showering: Supervision/safety Positioning of UE while sleeping: [place] expects to be discharged to:: Private residence Living Arrangements: Alone Available Help at Discharge: Family;Available PRN/intermittently (Available at night and intermittently during the day.) Type of  Home: Apartment Home Access: Stairs to enter Entrance Stairs-Number of Steps: 1   Home Layout: One level     Bathroom Shower/Tub: Tub/shower unit Shower/tub characteristics: Architectural technologist: Handicapped height     Home Equipment: Bedside commode;Adaptive equipment;Walker - 2 wheels;Cane - single point Adaptive Equipment: Reacher;Sock aid;Long-handled shoe horn;Long-handled sponge        Prior Functioning/Environment Level of Independence: Independent                 OT Problem List: Decreased strength;Decreased range of motion;Decreased activity tolerance;Impaired balance (sitting and/or standing);Decreased safety awareness;Decreased knowledge of use of DME or AE;Decreased knowledge of precautions;Pain   OT Treatment/Interventions: Self-care/ADL training;Therapeutic exercise;DME and/or AE instruction;Therapeutic activities;Patient/family education;Balance training    OT Goals(Current goals can be found in the care plan section) Acute Rehab OT Goals Patient Stated Goal: to go home today OT Goal Formulation: With patient Time For Goal Achievement: 07/26/16 Potential to Achieve Goals: Good ADL Goals Pt Will Perform Upper Body Bathing: with modified independence;sitting Pt Will Perform Lower Body Bathing: with modified independence;sit to/from stand Pt Will Perform Upper Body Dressing: with modified independence;sitting Pt Will Perform Lower Body Dressing: with modified independence;sit to/from stand Pt Will Transfer to Toilet: with modified independence;ambulating;bedside commode Pt Will Perform Toileting - Clothing Manipulation and hygiene: with modified independence;sit to/from stand Pt Will Perform Tub/Shower Transfer: with min guard assist;3 in 1;ambulating;Tub transfer Pt/caregiver will Perform Home Exercise Program: Right Upper extremity;Increased ROM;With written HEP provided (passive shoulder protocol)  OT Frequency: Min 1X/week   Barriers to D/C:             Co-evaluation              End of Session Equipment Utilized During Treatment: Gait belt Nurse Communication: Other (comment);Mobility status (Pt needs HH services)  Activity Tolerance: Patient tolerated treatment well Patient left:  in chair;with call bell/phone within reach   Time: 0918-1012 OT Time Calculation (min): 54 min Charges:  OT General Charges $OT Visit: 1 Procedure OT Evaluation $OT Eval Moderate Complexity: 1 Procedure OT Treatments $Self Care/Home Management : 38-52 mins  Norman Herrlich, OTR/L [id number] 07/12/2016, 10:46 AM

## 2016-07-12 NOTE — Progress Notes (Signed)
Occupational Therapy Treatment Patient Details Name: Kaitlyn Ferguson MRN: [id number] DOB: 1945/12/07 Today's Date: 07/12/2016    History of present illness Pt is a 70 y.o. female s/p R total shoulder arthroplasty. Pt has a past medical history significant for diabetes mellitus, arthritis, hyperlipidemia, hypertension, hypothyroidism,osteomyelitis of R ankle, R ankle fusion, back surgery, and L TKA.   OT comments  Pt progressing toward OT goals, but is limited by significant pain and increased lethargy this session.  Pt requiring max assist to complete UB dressing including donning/doffing sling at this time and remains highly unsteady with functional mobility. Pt unable to remain awake for practice with passive shoulder protocol HEP and required consistent VC's to continue with activity. Continued to discuss D/C plan with pt and she reports that she does not have 24 hour assistance set-up at this time. At this time, feel pt would benefit from SNF placement for continued rehabilitation as she lives alone, but pt reports that she is not in favor of SNF placement. Feel pt will need 24 hour assistance along with maximum home health services (PT, OT, aide) in order to safely D/C home.    Follow Up Recommendations  Home health OT;Supervision/Assistance - 24 hour;Other (comment) (Would benefit from SNF, but pt refusing this)    Equipment Recommendations  3 in 1 bedside comode    Recommendations for Other Services      Precautions / Restrictions Precautions Precautions: Shoulder Type of Shoulder Precautions: Passive protocol R shoulder: sling (may come out when in controlled sitting environment and for ADL/exercises); NWB R UE; PROM forward flexion 0-90, abduction 0-60, external rotation 0-30; No AROM. Shoulder Interventions: Shoulder sling/immobilizer;Off for dressing/bathing/exercises (May remove in controlled sitting environment) Precaution Booklet Issued: Yes (comment) Precaution Comments:  Reviewed handout and passive shoulder protocol as detailed above. Required Braces or Orthoses: Sling (R shoulder) Restrictions Weight Bearing Restrictions: Yes RUE Weight Bearing: Non weight bearing       Mobility Bed Mobility               General bed mobility comments: Received in recliner  Transfers Overall transfer level: Needs assistance Equipment used: 1 person hand held assist Transfers: Sit to/from Stand Sit to Stand: Min assist         General transfer comment: Min assist to support trunk during transitions.  Verbal cues for safe hand placement.  Pt reliant on hand and legs against sitting surface for balance and control. Multiple attempts before successful stand achieved.     Balance Overall balance assessment: Needs assistance Sitting-balance support: No upper extremity supported;Feet supported Sitting balance-Leahy Scale: Fair     Standing balance support: Single extremity supported Standing balance-Leahy Scale: Poor Standing balance comment: cannot stand unassisted at this time.                    ADL Overall ADL's : Needs assistance/impaired                 Upper Body Dressing : Maximal assistance;Sitting;Cueing for UE precautions                     General ADL Comments: Pt with increased lethargy and unable to stay awake for exercises despite consistent verbal cueing.       Vision                     Perception     Praxis      Cognition   Behavior During  Therapy: WFL for tasks assessed/performed Overall Cognitive Status: Within Functional Limits for tasks assessed       Memory: Decreased short-term memory (RN reports pt unable to report if therapy saw twice today)               Extremity/Trunk Assessment  Upper Extremity Assessment Upper Extremity Assessment: Defer to OT evaluation   Lower Extremity Assessment Lower Extremity Assessment: Generalized weakness RLE Deficits / Details: No  dorsiflexion/plantarflexion due to ankle fusion.   Cervical / Trunk Assessment Cervical / Trunk Assessment: Other exceptions Cervical / Trunk Exceptions: has an incision on her low back, h/o surgery.     Exercises Shoulder Exercises Shoulder Flexion: PROM;Self ROM;10 reps;Supine;Right;Other (comment) (0-90 degrees; pt able to achieve ~10 degrees this session) Shoulder ABduction: PROM;Self ROM;10 reps;Seated;Right;Other (comment) (0-60 degrees; pt able to achieve ~5 degrees this session) Shoulder External Rotation: PROM;Self ROM;10 reps;Seated;Right;Other (comment) (0-30 degrees; pt able to achieve ~ -10 degrees from neutral.) Elbow Flexion: AROM;10 reps;Seated;Right Elbow Extension: AROM;10 reps;Seated;Right Wrist Flexion: AROM;Right;10 reps;Seated Digit Composite Flexion: AROM;10 reps;Seated Donning/doffing sling/immobilizer: Maximal assistance Correct positioning of sling/immobilizer: Moderate assistance Pendulum exercises (written home exercise program):  (unsafe to attempt at this time due to lethargy) ROM for elbow, wrist and digits of operated UE: Supervision/safety (Consistent VC's as pt falling asleep) Sling wearing schedule (on at all times/off for ADL's): Supervision/safety   Shoulder Instructions Shoulder Instructions Donning/doffing sling/immobilizer: Maximal assistance Correct positioning of sling/immobilizer: Moderate assistance Pendulum exercises (written home exercise program):  (unsafe to attempt at this time due to lethargy) ROM for elbow, wrist and digits of operated UE: Supervision/safety (Consistent VC's as pt falling asleep) Sling wearing schedule (on at all times/off for ADL's): Supervision/safety     General Comments      Pertinent Vitals/ Pain       Pain Assessment: 0-10 Pain Score: 10-Worst pain ever (Reports this, pt falling asleep during exercises) Pain Location: R shoulder Pain Descriptors / Indicators: Aching;Burning Pain Intervention(s): Limited  activity within patient's tolerance;Monitored during session;Ice applied;Repositioned;Premedicated before session  [place] expects to be discharged to:: Private residence Living Arrangements: Alone Available Help at Discharge: Family;Available PRN/intermittently (Available at night and intermittently during the day.) Type of Home: Apartment Home Access: Stairs to enter Entrance Stairs-Number of Steps: 1   Home Layout: One level     Bathroom Shower/Tub: Teacher, early years/pre: Handicapped height     Home Equipment: Bedside commode;Adaptive equipment;Walker - 2 wheels;Cane - single point Adaptive Equipment: Reacher;Sock aid;Long-handled shoe horn;Long-handled sponge        Prior Functioning/Environment Level of Independence: Independent        Comments: Per pt report h/o falls since multiple ankle surgeries   Frequency  Min 3X/week        Progress Toward Goals  OT Goals(current goals can now be found in the care plan section)  Progress towards OT goals: Progressing toward goals  Acute Rehab OT Goals Patient Stated Goal: to go home OT Goal Formulation: With patient Time For Goal Achievement: 07/26/16 Potential to Achieve Goals: Good ADL Goals Pt Will Perform Upper Body Bathing: with modified independence;sitting Pt Will Perform Lower Body Bathing: with modified independence;sit to/from stand Pt Will Perform Upper Body Dressing: with modified independence;sitting Pt Will Perform Lower Body Dressing: with modified independence;sit to/from stand Pt Will Transfer to Toilet: with modified independence;ambulating;bedside commode Pt Will Perform Toileting - Clothing Manipulation and hygiene: with modified independence;sit to/from stand Pt Will Perform Tub/Shower Transfer: with min guard assist;3  in 1;ambulating;Tub transfer Pt/caregiver will Perform Home Exercise Program: Right Upper extremity;Increased ROM;With written HEP provided (passive  shoulder protocol)  Plan Discharge plan remains appropriate    Co-evaluation                 End of Session Equipment Utilized During Treatment: Other (comment) (R shoulder sling)   Activity Tolerance Patient tolerated treatment well   Patient Left in chair;with call bell/phone within reach   Nurse Communication Other (comment);Mobility status (Concern for safe D/C; lethargy)        Time: [id number] OT Time Calculation (min): 13 min  Charges: OT General Charges $OT Visit: 1 Procedure OT Treatments $Therapeutic Exercise: 8-22 mins  Norman Herrlich, OTR/L [phone] 07/12/2016, 3:25 PM

## 2016-07-12 NOTE — Care Management Note (Signed)
Case Management Note  Patient Details  Name: ICESIS FOLLMER MRN: [id number] Date of Birth: [birth year]  Subjective/Objective:   70 yr old female s/p right total shoulder arthroplasty.                 Action/Plan: Case manager spoke with patient concerning discharge plan. Choice was offered choice for Home Health agency, patient states she has used [place] in the past and wants to do so now. CM called referral to Christa See, Liaison for [facility]. Patient will have support at discharge.    Expected Discharge Date:    07/12/16               Expected Discharge Plan:  [place]  In-House Referral:  NA  Discharge planning Services  CM Consult  Post Acute Care Choice:  Home Health Choice offered to:  Patient  DME Arranged:  N/A DME Agency:  NA  HH Arranged:    [place] Agency:  [facility]  Status of Service:  Completed, signed off  If discussed at [employer] of Stay Meetings, dates discussed:    Additional Comments:  Ninfa Meeker, RN 07/12/2016, 11:18 AM

## 2016-07-13 LAB — GLUCOSE, CAPILLARY
Glucose-Capillary: 211 mg/dL — ABNORMAL HIGH (ref 65–99)
Glucose-Capillary: 244 mg/dL — ABNORMAL HIGH (ref 65–99)

## 2016-07-13 NOTE — Progress Notes (Signed)
Kaitlyn Ferguson  MRN: [id number] DOB/Age: 70-Mar-1947 70 y.o. Physician: Rada Hay Procedure: Procedure(s) (LRB): RIGHT TOTAL SHOULDER ARTHROPLASTY (Right)     Subjective: Discharge held yesterday due to oversedation and safety concerns. This am she is more alert, I had her stand and walk a bit. I have spoken with son who says they will have someone in home with her  Vital Signs Temp:  [98.7 F (37.1 C)-100 F (37.8 C)] 98.7 F (37.1 C) (11/18 0533) Pulse Rate:  [82-91] 82 (11/18 0533) Resp:  [16] 16 (11/17 1500) BP: (116-134)/(56-57) 134/56 (11/18 0533) SpO2:  [90 %-95 %] 95 % (11/18 0533)  Lab Results No results for input(s): WBC, HGB, HCT, PLT in the last 72 hours. BMET No results for input(s): NA, K, CL, CO2, GLUCOSE, BUN, CREATININE, CALCIUM in the last 72 hours. INR  Date Value Ref Range Status  12/14/2015 1.14 0.00 - 1.49 Final     Exam Dressing dry, [place] home today  [facility] PA-C  for Dr.Kevin Supple 07/13/2016, 10:35 AM Contact # [phone]

## 2016-07-13 NOTE — Progress Notes (Signed)
Occupational Therapy Treatment Patient Details Name: Kaitlyn Ferguson MRN: [id number] DOB: [birth year] Today's Date: 07/13/2016    History of present illness Pt is a 70 y.o. female s/p R total shoulder arthroplasty. Pt has a past medical history significant for diabetes mellitus, arthritis, hyperlipidemia, hypertension, hypothyroidism,osteomyelitis of R ankle, R ankle fusion, back surgery, and L TKA.   OT comments  Pt much more alert this session and able to participate in exercises, and ADLs. Pt still requiring mod to max assist for BUE tasks, and 1 HHA for mobility. Please see below. Pt will require 24 hour supervision to be safe at home.  Follow Up Recommendations  Home health OT;Supervision/Assistance - 24 hour;Other (comment)    Equipment Recommendations  3 in 1 bedside comode    Recommendations for Other Services      Precautions / Restrictions Precautions Precautions: Shoulder Type of Shoulder Precautions: Passive protocol R shoulder: sling (may come out when in controlled sitting environment and for ADL/exercises); NWB R UE; PROM forward flexion 0-90, abduction 0-60, external rotation 0-30; No AROM. Shoulder Interventions: Shoulder sling/immobilizer;Off for dressing/bathing/exercises (May remove in controlled sitting environment) Precaution Booklet Issued: Yes (comment) Precaution Comments: Reviewed handout and passive shoulder protocol as detailed above. Required Braces or Orthoses: Sling Restrictions Weight Bearing Restrictions: Yes RUE Weight Bearing: Non weight bearing       Mobility Bed Mobility               General bed mobility comments: Pt sitting OOB in recliner when OT enetered room "My bottom is sore from being in that bed all day"  Transfers Overall transfer level: Needs assistance Equipment used: 1 person hand held assist Transfers: Sit to/from Stand Sit to Stand: Min assist         General transfer comment: Pt continues to require support  from sitting surface, and HHA to power up from surface    Balance Overall balance assessment: Needs assistance Sitting-balance support: No upper extremity supported;Feet supported Sitting balance-Leahy Scale: Fair Sitting balance - Comments: in recliner and BSC   Standing balance support: No upper extremity supported;Single extremity supported Standing balance-Leahy Scale: Poor Standing balance comment: Required 1HHA during ambulation, and when washing hands at sink, rested RUE on sink                   ADL Overall ADL's : Needs assistance/impaired Eating/Feeding: Minimal assistance;Sitting Eating/Feeding Details (indicate cue type and reason): assist to cut up food before eating and opening container Grooming: Wash/dry hands;Min guard;Standing (leaning on sink)           Upper Body Dressing : Maximal assistance;Sitting;Cueing for UE precautions Upper Body Dressing Details (indicate cue type and reason): donned shirt with OT acting as caregiver Lower Body Dressing: Moderate assistance;Sit to/from stand Lower Body Dressing Details (indicate cue type and reason): donned underwear and pants, assist to get over feet and to pull up Toilet Transfer: Moderate assistance;BSC;Comfort height toilet;Grab bars (1HHA, BSC over comfort height toilet) Toilet Transfer Details (indicate cue type and reason): use of grab bars and 1HHA for safety Toileting- Clothing Manipulation and Hygiene: Min guard;Sit to/from stand Toileting - Clothing Manipulation Details (indicate cue type and reason): hospital gown, would be more difficult with underwear and pants     Functional mobility during ADLs: Modified independent (1 HHA) General ADL Comments: Pt much more alert today. Reported dizziness that did not lessen during session when standing. Pt requiring max assist for ADL due to RUE. Multiple cues for Pt to  relax so that she's not activiting muscles.      Vision                      Perception     Praxis      Cognition   Behavior During Therapy: [facility] for tasks assessed/performed Overall Cognitive Status: Within Functional Limits for tasks assessed                       Extremity/Trunk Assessment               Exercises Shoulder Exercises Pendulum Exercise: Standing;10 reps;Right (min assist to maintain balance, for safety) Shoulder Flexion: PROM;Self ROM;10 reps;Supine;Right;Other (comment) (Pt able to achieve about 15 degrees this session) Shoulder ABduction: PROM;Self ROM;10 reps;Seated;Right;Other (comment) (vc to move arm away from body - ~15 degrees this session) Shoulder External Rotation: PROM;Self ROM;10 reps;Seated;Right;Other (comment) (about 10 degrees from neutral) Elbow Flexion: AROM;10 reps;Seated;Right Elbow Extension: AROM;10 reps;Seated;Right Wrist Flexion: AROM;Right;10 reps;Seated Wrist Extension: AROM;Right;10 reps;Seated Digit Composite Flexion: AROM;10 reps;Seated Neck Flexion: AROM Neck Extension: AROM Neck Lateral Flexion - Right: AROM Neck Lateral Flexion - Left: AROM Donning/doffing shirt without moving shoulder: Maximal assistance (Pt anxious and squeezing arm into body - vc to relax) Method for sponge bathing under operated UE: Minimal assistance (min assist for balance and safety) Donning/doffing sling/immobilizer: Maximal assistance;Patient able to independently direct caregiver Correct positioning of sling/immobilizer: Moderate assistance Pendulum exercises (written home exercise program): Minimal assistance (for safety) ROM for elbow, wrist and digits of operated UE: Supervision/safety (Pt required education on them again) Sling wearing schedule (on at all times/off for ADL's): Supervision/safety (re-education on when its ok to come out of sling) Proper positioning of operated UE when showering: Supervision/safety   Shoulder Instructions Shoulder Instructions Donning/doffing shirt without moving shoulder:  Maximal assistance (Pt anxious and squeezing arm into body - vc to relax) Method for sponge bathing under operated UE: Minimal assistance (min assist for balance and safety) Donning/doffing sling/immobilizer: Maximal assistance;Patient able to independently direct caregiver Correct positioning of sling/immobilizer: Moderate assistance Pendulum exercises (written home exercise program): Minimal assistance (for safety) ROM for elbow, wrist and digits of operated UE: Supervision/safety (Pt required education on them again) Sling wearing schedule (on at all times/off for ADL's): Supervision/safety (re-education on when its ok to come out of sling) Proper positioning of operated UE when showering: Supervision/safety     General Comments      Pertinent Vitals/ Pain       Pain Assessment: 0-10 Pain Score: 6  Pain Location: R shoulder Pain Descriptors / Indicators: Sore Pain Intervention(s): Monitored during session;Repositioned;RN gave pain meds during session;Ice applied  Home Living                                          Prior Functioning/Environment              Frequency  Min 3X/week        Progress Toward Goals  OT Goals(current goals can now be found in the care plan section)  Progress towards OT goals: Progressing toward goals  Acute Rehab OT Goals Patient Stated Goal: [facility] Goal Formulation: With patient Time For Goal Achievement: 07/26/16 Potential to Achieve Goals: Good  Plan Discharge plan remains appropriate    Co-evaluation  End of Session Equipment Utilized During Treatment:  (R shoulder sling)   Activity Tolerance Patient tolerated treatment well   Patient Left in chair;with call bell/phone within reach   Nurse Communication Mobility status;Other (comment) (discharge plan moving forward)        Time: [id number] OT Time Calculation (min): 42 min  Charges: OT General  Charges $OT Visit: 1 Procedure OT Treatments $Self Care/Home Management : 23-37 mins $Therapeutic Exercise: 8-22 mins  Merri Ray Lior Hoen 07/13/2016, 10:07 AM Hulda Humphrey OTR/L [phone]

## 2016-07-13 NOTE — Progress Notes (Signed)
Physical Therapy Treatment Patient Details Name: Kaitlyn Ferguson MRN: [id number] DOB: 1946-05-25 Today's Date: 07/13/2016    History of Present Illness Pt is a 70 y.o. female s/p R total shoulder arthroplasty. Pt has a past medical history significant for diabetes mellitus, arthritis, hyperlipidemia, hypertension, hypothyroidism,osteomyelitis of R ankle, R ankle fusion, back surgery, and L TKA.    PT Comments    Pt presented sitting OOB in recliner when PT entered room. Pt making good progress since previous session with stability during transfers and ambulation with SPC and min guard for safety. Pt declining stair training at this time. Pt would continue to benefit from skilled physical therapy services at this time while admitted and after d/c to address her limitations in order to improve her overall safety and independence with functional mobility.   Follow Up Recommendations  Home health PT;Supervision for mobility/OOB (HHPT, HHOT, aide; pt refusing SNF)     Equipment Recommendations  None recommended by PT    Recommendations for Other Services       Precautions / Restrictions Precautions Precautions: Shoulder Type of Shoulder Precautions: Passive protocol R shoulder: sling (may come out when in controlled sitting environment and for ADL/exercises); NWB R UE; PROM forward flexion 0-90, abduction 0-60, external rotation 0-30; No AROM. Shoulder Interventions: Shoulder sling/immobilizer;Off for dressing/bathing/exercises (May remove in controlled sitting environment) Precaution Booklet Issued: Yes (comment) Precaution Comments: Reviewed handout and passive shoulder protocol as detailed above. Required Braces or Orthoses: Sling (R shoulder) Restrictions Weight Bearing Restrictions: Yes RUE Weight Bearing: Non weight bearing    Mobility  Bed Mobility               General bed mobility comments: pt sitting OOB in recliner when PT entered room  Transfers Overall  transfer level: Needs assistance Equipment used: 1 person hand held assist Transfers: Sit to/from Stand Sit to Stand: Min assist         General transfer comment: Min assist to support trunk during transitions.  Verbal cues for safe hand placement.  Pt reliant on hand and legs against sitting surface for balance and control.  Ambulation/Gait Ambulation/Gait assistance: Min guard Ambulation Distance (Feet): 50 Feet Assistive device: Straight cane Gait Pattern/deviations: Step-through pattern;Decreased step length - right;Decreased step length - left;Decreased stride length Gait velocity: decreased Gait velocity interpretation: <1.8 ft/sec, indicative of risk for recurrent falls General Gait Details: pt with improved stability during gait with use of SPC and min guard for safety   Stairs            Wheelchair Mobility    Modified Rankin (Stroke Patients Only)       Balance Overall balance assessment: Needs assistance Sitting-balance support: Feet supported;No upper extremity supported Sitting balance-Leahy Scale: Fair     Standing balance support: During functional activity;Single extremity supported Standing balance-Leahy Scale: Poor                      Cognition Arousal/Alertness: Awake/alert Behavior During Therapy: WFL for tasks assessed/performed Overall Cognitive Status: Within Functional Limits for tasks assessed                      Exercises      General Comments        Pertinent Vitals/Pain Pain Assessment: 0-10 Pain Score: 2  Pain Location: R shoulder Pain Descriptors / Indicators: Sore Pain Intervention(s): Monitored during session;Repositioned    Home Living  Prior Function            PT Goals (current goals can now be found in the care plan section) Acute Rehab PT Goals Patient Stated Goal: to go home PT Goal Formulation: With patient Time For Goal Achievement: 07/19/16 Potential to  Achieve Goals: Good Progress towards PT goals: Progressing toward goals    Frequency    Min 5X/week      PT Plan Current plan remains appropriate    Co-evaluation             End of Session Equipment Utilized During Treatment: Gait belt;Other (comment) (R UE sling) Activity Tolerance: Patient limited by fatigue Patient left: in chair;with call bell/phone within reach     Time: [id number] PT Time Calculation (min) (ACUTE ONLY): 12 min  Charges:  $Gait Training: 8-22 mins                    G CodesClearnce Sorrel Estrellita Lasky July 19, 2016, 9:04 AM Sherie Don, PT, DPT [phone]

## 2016-07-14 DIAGNOSIS — Z7982 Long term (current) use of aspirin: Secondary | ICD-10-CM | POA: Diagnosis not present

## 2016-07-14 DIAGNOSIS — Z96652 Presence of left artificial knee joint: Secondary | ICD-10-CM | POA: Diagnosis not present

## 2016-07-14 DIAGNOSIS — E039 Hypothyroidism, unspecified: Secondary | ICD-10-CM | POA: Diagnosis not present

## 2016-07-14 DIAGNOSIS — E785 Hyperlipidemia, unspecified: Secondary | ICD-10-CM | POA: Diagnosis not present

## 2016-07-14 DIAGNOSIS — Z794 Long term (current) use of insulin: Secondary | ICD-10-CM | POA: Diagnosis not present

## 2016-07-14 DIAGNOSIS — Z471 Aftercare following joint replacement surgery: Secondary | ICD-10-CM | POA: Diagnosis not present

## 2016-07-14 DIAGNOSIS — Z6839 Body mass index (BMI) 39.0-39.9, adult: Secondary | ICD-10-CM | POA: Diagnosis not present

## 2016-07-14 DIAGNOSIS — E119 Type 2 diabetes mellitus without complications: Secondary | ICD-10-CM | POA: Diagnosis not present

## 2016-07-14 DIAGNOSIS — Z96611 Presence of right artificial shoulder joint: Secondary | ICD-10-CM | POA: Diagnosis not present

## 2016-07-14 DIAGNOSIS — Z79891 Long term (current) use of opiate analgesic: Secondary | ICD-10-CM | POA: Diagnosis not present

## 2016-07-14 DIAGNOSIS — I10 Essential (primary) hypertension: Secondary | ICD-10-CM | POA: Diagnosis not present

## 2016-07-14 DIAGNOSIS — E669 Obesity, unspecified: Secondary | ICD-10-CM | POA: Diagnosis not present

## 2016-07-14 DIAGNOSIS — Z981 Arthrodesis status: Secondary | ICD-10-CM | POA: Diagnosis not present

## 2016-07-14 LAB — GLUCOSE, CAPILLARY: Glucose-Capillary: 150 mg/dL — ABNORMAL HIGH (ref 65–99)

## 2016-07-15 ENCOUNTER — Encounter (HOSPITAL_COMMUNITY): Payer: Self-pay | Admitting: Orthopedic Surgery

## 2016-07-16 NOTE — Consult Note (Signed)
            [facility] CM Primary Care Navigator  07/16/2016  SHEALENE PARO [birth year] [id number]   Wentto see patient to identify possible discharge needs butpatient was already discharged.  Primary care provider's office contacted (Shari)to notify of patient's discharge and need for possible post hospital follow-up and transition of care. Made aware to refer patient to [facility] [employer] care management for care coordination needs if deemed appropriate.  For additional questions please contact:  Edwena Felty A. Amnah Breuer, BSN, RN-BC [facility] PRIMARY CARE Navigator Cell: [phone]

## 2016-07-22 DIAGNOSIS — Z96652 Presence of left artificial knee joint: Secondary | ICD-10-CM | POA: Diagnosis not present

## 2016-07-22 DIAGNOSIS — E669 Obesity, unspecified: Secondary | ICD-10-CM | POA: Diagnosis not present

## 2016-07-22 DIAGNOSIS — E785 Hyperlipidemia, unspecified: Secondary | ICD-10-CM | POA: Diagnosis not present

## 2016-07-22 DIAGNOSIS — Z794 Long term (current) use of insulin: Secondary | ICD-10-CM | POA: Diagnosis not present

## 2016-07-22 DIAGNOSIS — E119 Type 2 diabetes mellitus without complications: Secondary | ICD-10-CM | POA: Diagnosis not present

## 2016-07-22 DIAGNOSIS — I10 Essential (primary) hypertension: Secondary | ICD-10-CM | POA: Diagnosis not present

## 2016-07-22 DIAGNOSIS — Z471 Aftercare following joint replacement surgery: Secondary | ICD-10-CM | POA: Diagnosis not present

## 2016-07-22 DIAGNOSIS — Z96611 Presence of right artificial shoulder joint: Secondary | ICD-10-CM | POA: Diagnosis not present

## 2016-07-22 DIAGNOSIS — Z79891 Long term (current) use of opiate analgesic: Secondary | ICD-10-CM | POA: Diagnosis not present

## 2016-07-22 DIAGNOSIS — E039 Hypothyroidism, unspecified: Secondary | ICD-10-CM | POA: Diagnosis not present

## 2016-07-22 DIAGNOSIS — Z7982 Long term (current) use of aspirin: Secondary | ICD-10-CM | POA: Diagnosis not present

## 2016-07-22 DIAGNOSIS — Z981 Arthrodesis status: Secondary | ICD-10-CM | POA: Diagnosis not present

## 2016-07-22 DIAGNOSIS — Z6839 Body mass index (BMI) 39.0-39.9, adult: Secondary | ICD-10-CM | POA: Diagnosis not present

## 2016-07-24 DIAGNOSIS — Z471 Aftercare following joint replacement surgery: Secondary | ICD-10-CM | POA: Diagnosis not present

## 2016-07-24 DIAGNOSIS — Z96611 Presence of right artificial shoulder joint: Secondary | ICD-10-CM | POA: Diagnosis not present

## 2016-07-25 DIAGNOSIS — H353221 Exudative age-related macular degeneration, left eye, with active choroidal neovascularization: Secondary | ICD-10-CM | POA: Diagnosis not present

## 2016-07-25 DIAGNOSIS — H4052X4 Glaucoma secondary to other eye disorders, left eye, indeterminate stage: Secondary | ICD-10-CM | POA: Diagnosis not present

## 2016-07-29 DIAGNOSIS — Z79891 Long term (current) use of opiate analgesic: Secondary | ICD-10-CM | POA: Diagnosis not present

## 2016-07-29 DIAGNOSIS — I10 Essential (primary) hypertension: Secondary | ICD-10-CM | POA: Diagnosis not present

## 2016-07-29 DIAGNOSIS — E039 Hypothyroidism, unspecified: Secondary | ICD-10-CM | POA: Diagnosis not present

## 2016-07-29 DIAGNOSIS — Z7982 Long term (current) use of aspirin: Secondary | ICD-10-CM | POA: Diagnosis not present

## 2016-07-29 DIAGNOSIS — E669 Obesity, unspecified: Secondary | ICD-10-CM | POA: Diagnosis not present

## 2016-07-29 DIAGNOSIS — E785 Hyperlipidemia, unspecified: Secondary | ICD-10-CM | POA: Diagnosis not present

## 2016-07-29 DIAGNOSIS — Z6839 Body mass index (BMI) 39.0-39.9, adult: Secondary | ICD-10-CM | POA: Diagnosis not present

## 2016-07-29 DIAGNOSIS — Z96652 Presence of left artificial knee joint: Secondary | ICD-10-CM | POA: Diagnosis not present

## 2016-07-29 DIAGNOSIS — Z794 Long term (current) use of insulin: Secondary | ICD-10-CM | POA: Diagnosis not present

## 2016-07-29 DIAGNOSIS — Z981 Arthrodesis status: Secondary | ICD-10-CM | POA: Diagnosis not present

## 2016-07-29 DIAGNOSIS — E119 Type 2 diabetes mellitus without complications: Secondary | ICD-10-CM | POA: Diagnosis not present

## 2016-07-29 DIAGNOSIS — Z96611 Presence of right artificial shoulder joint: Secondary | ICD-10-CM | POA: Diagnosis not present

## 2016-07-29 DIAGNOSIS — Z471 Aftercare following joint replacement surgery: Secondary | ICD-10-CM | POA: Diagnosis not present

## 2016-08-05 DIAGNOSIS — Z7982 Long term (current) use of aspirin: Secondary | ICD-10-CM | POA: Diagnosis not present

## 2016-08-05 DIAGNOSIS — Z981 Arthrodesis status: Secondary | ICD-10-CM | POA: Diagnosis not present

## 2016-08-05 DIAGNOSIS — Z471 Aftercare following joint replacement surgery: Secondary | ICD-10-CM | POA: Diagnosis not present

## 2016-08-05 DIAGNOSIS — I10 Essential (primary) hypertension: Secondary | ICD-10-CM | POA: Diagnosis not present

## 2016-08-05 DIAGNOSIS — E119 Type 2 diabetes mellitus without complications: Secondary | ICD-10-CM | POA: Diagnosis not present

## 2016-08-05 DIAGNOSIS — Z79891 Long term (current) use of opiate analgesic: Secondary | ICD-10-CM | POA: Diagnosis not present

## 2016-08-05 DIAGNOSIS — E785 Hyperlipidemia, unspecified: Secondary | ICD-10-CM | POA: Diagnosis not present

## 2016-08-05 DIAGNOSIS — Z6839 Body mass index (BMI) 39.0-39.9, adult: Secondary | ICD-10-CM | POA: Diagnosis not present

## 2016-08-05 DIAGNOSIS — Z96652 Presence of left artificial knee joint: Secondary | ICD-10-CM | POA: Diagnosis not present

## 2016-08-05 DIAGNOSIS — E669 Obesity, unspecified: Secondary | ICD-10-CM | POA: Diagnosis not present

## 2016-08-05 DIAGNOSIS — E039 Hypothyroidism, unspecified: Secondary | ICD-10-CM | POA: Diagnosis not present

## 2016-08-05 DIAGNOSIS — Z794 Long term (current) use of insulin: Secondary | ICD-10-CM | POA: Diagnosis not present

## 2016-08-05 DIAGNOSIS — Z96611 Presence of right artificial shoulder joint: Secondary | ICD-10-CM | POA: Diagnosis not present

## 2016-08-06 DIAGNOSIS — H4052X4 Glaucoma secondary to other eye disorders, left eye, indeterminate stage: Secondary | ICD-10-CM | POA: Diagnosis not present

## 2016-08-09 DIAGNOSIS — M6281 Muscle weakness (generalized): Secondary | ICD-10-CM | POA: Diagnosis not present

## 2016-08-28 ENCOUNTER — Ambulatory Visit
Admission: RE | Admit: 2016-08-28 | Discharge: 2016-08-28 | Disposition: A | Discharge status: Home / Self Care | Payer: PPO | Source: Ambulatory Visit | Attending: Family Medicine | Admitting: Family Medicine

## 2016-08-28 DIAGNOSIS — Z1231 Encounter for screening mammogram for malignant neoplasm of breast: Secondary | ICD-10-CM | POA: Diagnosis not present

## 2016-09-06 ENCOUNTER — Other Ambulatory Visit: Payer: Self-pay | Admitting: *Deleted

## 2016-09-06 MED ORDER — ROSUVASTATIN CALCIUM 20 MG PO TABS: 20.0000 mg | ORAL_TABLET | Freq: Every evening | ORAL | 3 refills | Status: DC

## 2016-09-10 ENCOUNTER — Ambulatory Visit: Payer: PPO | Admitting: Family Medicine

## 2016-09-10 NOTE — Progress Notes (Deleted)
Subjective:    Patient ID: Kaitlyn Ferguson , female   DOB: 1946/02/26 , 71 y.o..   MRN: [id number]  HPI  Kaitlyn Ferguson is here for No chief complaint on file.  Chronic Diabetes  Disease Monitoring  Blood Sugar Ranges: ***  Polyuria: {YES/NO/WILD CARDS:18581}   Visual problems: {YES/NO/WILD CARDS:18581}   Last hemoglobin A1C:  Lab Results  Component Value Date   HGBA1C 8.1 (H) 07/02/2016    Medication Compliance: {YES/NO/WILD CARDS:18581}  Medication Side Effects  Hypoglycemia: {YES/NO/WILD [id number]   Preventitive Health Care  Eye Exam: overdue  Foot Exam: up to date  Diet pattern: ***  Exercise: ***  Hypertension Blood pressure at home: *** Exercise: *** Low salt diet: *** Medications: Compliant with *** Side effects: *** ROS: Denies headache, dizziness, visual changes, nausea, vomiting, chest pain, abdominal pain or shortness of breath. BP Readings from Last 3 Encounters:  07/13/16 (!) 134/56  07/02/16 (!) 153/65  05/23/16 140/66      Review of Systems: Per HPI. All other systems reviewed and are negative.  Health Maintenance Due  Topic Date Due  . ZOSTAVAX  04/06/2006  . PNA vac Low Risk Adult (1 of 2 - PCV13) 04/07/2011  . TETANUS/TDAP  10/01/2014  . OPHTHALMOLOGY EXAM  12/25/2014    Past Medical History: Patient Active Problem List   Diagnosis Date Noted  . S/P shoulder replacement, right 07/11/2016  . Primary osteoarthritis of left knee 12/21/2015  . Preoperative clearance 11/24/2015  . History of ankle fracture 10/04/2015  . Left knee pain 08/10/2015  . Right shoulder pain 08/10/2015  . Dizziness 05/16/2015  . Cervical strain 02/28/2015  . Diabetes mellitus without complication ([place]) [id number]  . Hyperlipidemia 03/18/2007  . Hypothyroidism 10/23/2006  . OBESITY, NOS 10/23/2006  . HYPERTENSION, BENIGN SYSTEMIC 10/23/2006  . ARTHRITIS, DEGENERATIVE 10/23/2006    Medications: reviewed and updated Current Outpatient  Prescriptions  Medication Sig Dispense Refill  . amLODipine (NORVASC) 5 MG tablet take 1 tablet by mouth once daily 30 tablet 3  . aspirin EC 81 MG tablet Take 81 mg by mouth daily.    . B-D ULTRA-FINE 33 LANCETS MISC Check BID.  Supplies sufficient for one month 100 each 11  . Blood Glucose Monitoring Suppl (ACCU-CHEK AVIVA) device Use as instructed 1 each 0  . diazepam (VALIUM) 5 MG tablet Take 0.5-1 tablets (2.5-5 mg total) by mouth every 6 (six) hours as needed for muscle spasms or sedation. 40 tablet 1  . glucose blood (FREESTYLE LITE) test strip Use as instructed 100 each 12  . ibuprofen (ADVIL,MOTRIN) 200 MG tablet Take 200 mg by mouth every 8 (eight) hours as needed (for general aches/pain.).    . Insulin Glargine (LANTUS SOLOSTAR) 100 UNIT/ML Solostar Pen inject 60 units subcutaneously every morning 45 mL 1  . Insulin Pen Needle (B-D UF III MINI PEN NEEDLES) 31G X 5 MM MISC Dispense qt sufficient for two shots daily.  Supplies sufficient for one month. 100 each 11  . levothyroxine (SYNTHROID, LEVOTHROID) 112 MCG tablet take 1 tablet by mouth once daily (Patient taking differently: Take 112 mcg by mouth daily before breakfast. ) 90 tablet 3  . Liraglutide 18 MG/3ML SOPN Inject 0.3 mLs (1.8 mg total) into the skin daily. 3 mL 11  . lisinopril-hydrochlorothiazide (PRINZIDE,ZESTORETIC) 20-12.5 MG tablet take 1 tablets by mouth once daily 90 tablet 1  . metFORMIN (GLUCOPHAGE) 500 MG tablet Take 1-1.5 tablets (500-750 mg total) by mouth 2 (two) times daily. Pt takes  500 mg in the Am and 750 mg at night (Patient taking differently: Take 500 mg by mouth 2 (two) times daily. ) 60 tablet 1  . Multiple Vitamin (MULTIVITAMIN WITH MINERALS) TABS tablet Take 1 tablet by mouth daily. With Vitamin D3    . omeprazole (PRILOSEC) 40 MG capsule Take 40 mg by mouth daily.    . ondansetron (ZOFRAN) 4 MG tablet Take 1 tablet (4 mg total) by mouth every 8 (eight) hours as needed for nausea or vomiting. 20 tablet 0   . oxyCODONE-acetaminophen (PERCOCET) 5-325 MG tablet Take 1-2 tablets by mouth every 4 (four) hours as needed. 60 tablet 0  . pilocarpine (PILOCAR) 2 % ophthalmic solution Place 1 drop into the left eye 3 (three) times daily.    . rosuvastatin (CRESTOR) 20 MG tablet Take 1 tablet (20 mg total) by mouth every evening. 30 tablet 3   No current facility-administered medications for this visit.     Social Hx:  reports that she has never smoked. She has never used smokeless tobacco.   Objective:   There were no vitals taken for this visit. Physical Exam  Gen: NAD, alert, cooperative with exam, well-appearing HEENT: NCAT, PERRL, clear conjunctiva, oropharynx clear, supple neck Cardiac: Regular rate and rhythm, normal S1/S2, no murmur, no edema, capillary refill brisk  Respiratory: Clear to auscultation bilaterally, no wheezes, non-labored breathing Gastrointestinal: soft, non tender, non distended, bowel sounds present Skin: no rashes, normal turgor  Neurological: no gross deficits.  Psych: good insight, normal mood and affect   Assessment & Plan:  No problem-specific Assessment & Plan notes found for this encounter.   Smitty Cords, MD [place], PGY-2

## 2016-09-17 DIAGNOSIS — H353221 Exudative age-related macular degeneration, left eye, with active choroidal neovascularization: Secondary | ICD-10-CM | POA: Diagnosis not present

## 2016-09-30 ENCOUNTER — Other Ambulatory Visit: Payer: Self-pay | Admitting: *Deleted

## 2016-09-30 MED ORDER — INSULIN PEN NEEDLE 31G X 5 MM MISC: 11 refills | Status: DC

## 2016-10-04 ENCOUNTER — Encounter: Payer: Self-pay | Admitting: Family Medicine

## 2016-10-04 ENCOUNTER — Ambulatory Visit (INDEPENDENT_AMBULATORY_CARE_PROVIDER_SITE_OTHER): Payer: PPO | Admitting: Family Medicine

## 2016-10-04 VITALS — BP 120/70 | HR 81 | Temp 98.2°F | Ht 67.0 in | Wt 247.2 lb

## 2016-10-04 DIAGNOSIS — E039 Hypothyroidism, unspecified: Secondary | ICD-10-CM | POA: Diagnosis not present

## 2016-10-04 DIAGNOSIS — I10 Essential (primary) hypertension: Secondary | ICD-10-CM | POA: Diagnosis not present

## 2016-10-04 DIAGNOSIS — Z794 Long term (current) use of insulin: Secondary | ICD-10-CM | POA: Diagnosis not present

## 2016-10-04 DIAGNOSIS — E119 Type 2 diabetes mellitus without complications: Secondary | ICD-10-CM | POA: Diagnosis not present

## 2016-10-04 LAB — POCT GLYCOSYLATED HEMOGLOBIN (HGB A1C): Hemoglobin A1C: 7.5

## 2016-10-04 LAB — TSH: TSH: 0.77 mIU/L

## 2016-10-04 MED ORDER — GLUCOSE BLOOD VI STRP: ORAL_STRIP | 12 refills | Status: DC

## 2016-10-04 MED ORDER — LIRAGLUTIDE 18 MG/3ML ~~LOC~~ SOPN: PEN_INJECTOR | SUBCUTANEOUS | 3 refills | Status: DC

## 2016-10-04 NOTE — Assessment & Plan Note (Signed)
Long-term use of Synthroid. TSH last checked June 2017 was normal. -Continue Synthroid 112 g daily -Check TSH today

## 2016-10-04 NOTE — Progress Notes (Signed)
Subjective:    Patient ID: Kaitlyn Ferguson , female   DOB: 16-Apr-1946 , 71 y.o..   MRN: [id number]  HPI  Kaitlyn Ferguson is here for  Chief Complaint  Patient presents with  . Diabetes   Chronic Diabetes  Disease Monitoring  Blood Sugar Ranges: she forgot to bring her log of her sugars but she thinks they are usually in the 130-150s.   Polyuria: no   Visual problems: yes. She has glaucoma and cataracts according to the patient. She sees the eye doctor next week.  Medication Compliance: yes taking Metformin 500 mg BID, and Lantus 56 units every night changed 2 weeks ago.  Medication Side Effects  Hypoglycemia: yes, one yesterday she felt sweaty and weak. She did not check her sugar at that point. She notes that this is because she didn't eat breakfast.  Before then it happened about 3 months.   Preventitive Health Care  Eye Exam: Yes, following up with the opthamologist   Foot Exam: up to date   Diet pattern: Stays to a low carb diet but she does like fried foods   Exercise: She is going to join the [facility] next week. But she hasn't been exercising recently  Hypothyroidism  Current symptoms: none . Patient denies change in energy level, diarrhea, heat / cold intolerance, nervousness, palpitations and weight changes. Symptoms have been well-controlled.  Hypertension Blood pressure at home: Does not check Exercise: Minimal minimal Low salt diet: Tries to be compliant Medications: Compliant with amlodipine, lisinopril, hydrochlorothiazide Side effects: None noted ROS: Denies headache, dizziness, visual changes, nausea, vomiting, chest pain, abdominal pain or shortness of breath. BP Readings from Last 3 Encounters:  10/04/16 120/70  07/13/16 (!) 134/56  07/02/16 (!) 153/65    Review of Systems: Per HPI. All other systems reviewed and are negative.  Past Medical History: Patient Active Problem List   Diagnosis Date Noted  . S/P shoulder replacement, right 07/11/2016    . Primary osteoarthritis of left knee 12/21/2015  . Preoperative clearance 11/24/2015  . History of ankle fracture 10/04/2015  . Left knee pain 08/10/2015  . Right shoulder pain 08/10/2015  . Dizziness 05/16/2015  . Cervical strain 02/28/2015  . Diabetes mellitus without complication ([place]) [id number]  . Hyperlipidemia 03/18/2007  . Hypothyroidism 10/23/2006  . OBESITY, NOS 10/23/2006  . HYPERTENSION, BENIGN SYSTEMIC 10/23/2006  . ARTHRITIS, DEGENERATIVE 10/23/2006    Medications: reviewed and updated Current Outpatient Prescriptions  Medication Sig Dispense Refill  . amLODipine (NORVASC) 5 MG tablet take 1 tablet by mouth once daily 30 tablet 3  . aspirin EC 81 MG tablet Take 81 mg by mouth daily.    . B-D ULTRA-FINE 33 LANCETS MISC Check BID.  Supplies sufficient for one month 100 each 11  . Blood Glucose Monitoring Suppl (ACCU-CHEK AVIVA) device Use as instructed 1 each 0  . glucose blood (FREESTYLE LITE) test strip Use as instructed 200 each 12  . Insulin Glargine (LANTUS SOLOSTAR) 100 UNIT/ML Solostar Pen inject 60 units subcutaneously every morning (Patient taking differently: Inject 56 Units into the skin daily at 10 pm. inject 60 units subcutaneously every morning) 45 mL 1  . Insulin Pen Needle (B-D UF III MINI PEN NEEDLES) 31G X 5 MM MISC Dispense qt sufficient for two shots daily.  Supplies sufficient for one month. 100 each 11  . levothyroxine (SYNTHROID, LEVOTHROID) 112 MCG tablet take 1 tablet by mouth once daily (Patient taking differently: Take 112 mcg by mouth daily before breakfast. )  90 tablet 3  . lisinopril-hydrochlorothiazide (PRINZIDE,ZESTORETIC) 20-12.5 MG tablet take 1 tablets by mouth once daily 90 tablet 1  . metFORMIN (GLUCOPHAGE) 500 MG tablet Take 1-1.5 tablets (500-750 mg total) by mouth 2 (two) times daily. Pt takes 500 mg in the Am and 750 mg at night (Patient taking differently: Take 500 mg by mouth 2 (two) times daily. ) 60 tablet 1  . omeprazole  (PRILOSEC) 40 MG capsule Take 40 mg by mouth daily.    . pilocarpine (PILOCAR) 2 % ophthalmic solution Place 1 drop into the left eye 3 (three) times daily.    . rosuvastatin (CRESTOR) 20 MG tablet Take 1 tablet (20 mg total) by mouth every evening. 30 tablet 3  . ibuprofen (ADVIL,MOTRIN) 200 MG tablet Take 200 mg by mouth every 8 (eight) hours as needed (for general aches/pain.).    Marland Kitchen liraglutide 18 MG/3ML SOPN Inject 0.3 mLs (1.8 mg total) into the skin daily. 1 pen 3  . Multiple Vitamin (MULTIVITAMIN WITH MINERALS) TABS tablet Take 1 tablet by mouth daily. With Vitamin D3     No current facility-administered medications for this visit.     Social Hx:  reports that she has never smoked. She has never used smokeless tobacco.   Objective:   BP 120/70   Pulse 81   Temp 98.2 F (36.8 C) (Oral)   Ht 5\' 7"  (1.702 m)   Wt 247 lb 3.2 oz (112.1 kg)   SpO2 95%   BMI 38.72 kg/m  Physical Exam  Gen: NAD, alert, cooperative with exam, well-appearing HEENT: NCAT, PERRL, clear conjunctiva, oropharynx clear, supple neck Cardiac: Regular rate and rhythm, normal S1/S2, no edema, capillary refill brisk  Respiratory: Clear to auscultation bilaterally, no wheezes, non-labored breathing Skin: no rashes, normal turgor  Neurological: no gross deficits.  Psych: good insight, normal mood and affect   Assessment & Plan:  Hypothyroidism Long-term use of Synthroid. TSH last checked June 2017 was normal. -Continue Synthroid 112 g daily -Check TSH today  HYPERTENSION, BENIGN SYSTEMIC Controlled and at goal.  - Continue current medication regimen   Diabetes mellitus without complication (HCC) Improving. Almost at A1C goal of less than 7. Hemoglobin A1C is 7.5 today. Self titrated Lantus to 56 units daily a couple weeks ago. -Discussed increasing physical activity at [facility] -Continue Lantus 56 units daily -Continue Victoza 1.8 mg daily -Continue metformin 500 mg in the morning and 500 mg at  night -Recheck A1c in 3 months, if less than 7 consider stopping Victoza    Smitty Cords, MD [place], PGY-2

## 2016-10-04 NOTE — Patient Instructions (Signed)
Thank you for coming in today, it was so nice to see you! Today we talked about:    Diabetes: Continue Victoza, metformin, and lantus 56 units at night. I have sent in a prescription for more test strips. Let me know if this is not enough and I'll send in more. Come back in 3 months to recheck your A1c  Blood pressure: awesome. Keep up the good work.   Thyroid: We will check your TSH hormone today  Please follow up in 3 months.   If we ordered any tests today, you will be notified via telephone of any abnormalities. If everything is normal you will get a letter in the mail.   If you have any questions or concerns, please do not hesitate to call the office at [phone]. You can also message me directly via MyChart.   Sincerely,  Smitty Cords, MD

## 2016-10-04 NOTE — Assessment & Plan Note (Signed)
Controlled and at goal. Continue current medication regimen.  

## 2016-10-04 NOTE — Assessment & Plan Note (Signed)
Improving. Almost at A1C goal of less than 7. Hemoglobin A1C is 7.5 today. Self titrated Lantus to 56 units daily a couple weeks ago. -Discussed increasing physical activity at [facility] -Continue Lantus 56 units daily -Continue Victoza 1.8 mg daily -Continue metformin 500 mg in the morning and 500 mg at night -Recheck A1c in 3 months, if less than 7 consider stopping Victoza

## 2016-10-07 DIAGNOSIS — Z96611 Presence of right artificial shoulder joint: Secondary | ICD-10-CM | POA: Diagnosis not present

## 2016-10-08 DIAGNOSIS — H40033 Anatomical narrow angle, bilateral: Secondary | ICD-10-CM | POA: Diagnosis not present

## 2016-10-08 DIAGNOSIS — H353222 Exudative age-related macular degeneration, left eye, with inactive choroidal neovascularization: Secondary | ICD-10-CM | POA: Diagnosis not present

## 2016-10-08 DIAGNOSIS — H353112 Nonexudative age-related macular degeneration, right eye, intermediate dry stage: Secondary | ICD-10-CM | POA: Diagnosis not present

## 2016-10-08 DIAGNOSIS — H2513 Age-related nuclear cataract, bilateral: Secondary | ICD-10-CM | POA: Diagnosis not present

## 2016-10-08 DIAGNOSIS — H43813 Vitreous degeneration, bilateral: Secondary | ICD-10-CM | POA: Diagnosis not present

## 2016-10-09 ENCOUNTER — Encounter: Payer: Self-pay | Admitting: Family Medicine

## 2016-10-16 ENCOUNTER — Other Ambulatory Visit: Payer: Self-pay | Admitting: Family Medicine

## 2016-10-29 DIAGNOSIS — H4052X4 Glaucoma secondary to other eye disorders, left eye, indeterminate stage: Secondary | ICD-10-CM | POA: Diagnosis not present

## 2016-10-29 DIAGNOSIS — H353221 Exudative age-related macular degeneration, left eye, with active choroidal neovascularization: Secondary | ICD-10-CM | POA: Diagnosis not present

## 2016-10-29 DIAGNOSIS — H353134 Nonexudative age-related macular degeneration, bilateral, advanced atrophic with subfoveal involvement: Secondary | ICD-10-CM | POA: Diagnosis not present

## 2016-10-29 DIAGNOSIS — H35371 Puckering of macula, right eye: Secondary | ICD-10-CM | POA: Diagnosis not present

## 2016-11-02 DIAGNOSIS — H2512 Age-related nuclear cataract, left eye: Secondary | ICD-10-CM | POA: Diagnosis not present

## 2016-11-07 DIAGNOSIS — H2512 Age-related nuclear cataract, left eye: Secondary | ICD-10-CM | POA: Diagnosis not present

## 2016-11-07 DIAGNOSIS — H52222 Regular astigmatism, left eye: Secondary | ICD-10-CM | POA: Diagnosis not present

## 2016-11-10 ENCOUNTER — Other Ambulatory Visit: Payer: Self-pay | Admitting: Family Medicine

## 2016-11-12 ENCOUNTER — Other Ambulatory Visit: Payer: Self-pay | Admitting: Family Medicine

## 2016-11-26 ENCOUNTER — Other Ambulatory Visit: Payer: Self-pay | Admitting: Family Medicine

## 2016-12-03 DIAGNOSIS — H353221 Exudative age-related macular degeneration, left eye, with active choroidal neovascularization: Secondary | ICD-10-CM | POA: Diagnosis not present

## 2016-12-03 DIAGNOSIS — H353134 Nonexudative age-related macular degeneration, bilateral, advanced atrophic with subfoveal involvement: Secondary | ICD-10-CM | POA: Diagnosis not present

## 2016-12-07 ENCOUNTER — Other Ambulatory Visit: Payer: Self-pay | Admitting: Family Medicine

## 2016-12-09 ENCOUNTER — Other Ambulatory Visit: Payer: Self-pay | Admitting: Family Medicine

## 2016-12-09 MED ORDER — OMEPRAZOLE 40 MG PO CPDR: 40.0000 mg | DELAYED_RELEASE_CAPSULE | Freq: Every day | ORAL | 3 refills | Status: DC

## 2016-12-12 ENCOUNTER — Other Ambulatory Visit: Payer: Self-pay | Admitting: Family Medicine

## 2017-01-06 DIAGNOSIS — Z96611 Presence of right artificial shoulder joint: Secondary | ICD-10-CM | POA: Diagnosis not present

## 2017-01-06 DIAGNOSIS — Z471 Aftercare following joint replacement surgery: Secondary | ICD-10-CM | POA: Diagnosis not present

## 2017-01-08 ENCOUNTER — Other Ambulatory Visit: Payer: Self-pay | Admitting: Family Medicine

## 2017-01-09 DIAGNOSIS — H3570 Unspecified separation of retinal layers: Secondary | ICD-10-CM | POA: Diagnosis not present

## 2017-01-09 DIAGNOSIS — H353134 Nonexudative age-related macular degeneration, bilateral, advanced atrophic with subfoveal involvement: Secondary | ICD-10-CM | POA: Diagnosis not present

## 2017-01-09 DIAGNOSIS — E119 Type 2 diabetes mellitus without complications: Secondary | ICD-10-CM | POA: Diagnosis not present

## 2017-01-09 DIAGNOSIS — H4052X4 Glaucoma secondary to other eye disorders, left eye, indeterminate stage: Secondary | ICD-10-CM | POA: Diagnosis not present

## 2017-01-09 DIAGNOSIS — H353221 Exudative age-related macular degeneration, left eye, with active choroidal neovascularization: Secondary | ICD-10-CM | POA: Diagnosis not present

## 2017-01-14 ENCOUNTER — Ambulatory Visit: Payer: PPO | Admitting: Family Medicine

## 2017-01-16 ENCOUNTER — Telehealth: Payer: Self-pay | Admitting: Family Medicine

## 2017-01-16 NOTE — Telephone Encounter (Signed)
Pre-visit call. No answer, LMOVM.

## 2017-01-17 ENCOUNTER — Ambulatory Visit (INDEPENDENT_AMBULATORY_CARE_PROVIDER_SITE_OTHER): Payer: PPO | Admitting: Family Medicine

## 2017-01-17 ENCOUNTER — Encounter: Payer: Self-pay | Admitting: Family Medicine

## 2017-01-17 VITALS — BP 126/80 | HR 82 | Temp 98.2°F | Ht 67.0 in | Wt 252.2 lb

## 2017-01-17 DIAGNOSIS — E119 Type 2 diabetes mellitus without complications: Secondary | ICD-10-CM | POA: Diagnosis not present

## 2017-01-17 DIAGNOSIS — Z23 Encounter for immunization: Secondary | ICD-10-CM | POA: Diagnosis not present

## 2017-01-17 DIAGNOSIS — I10 Essential (primary) hypertension: Secondary | ICD-10-CM

## 2017-01-17 LAB — POCT GLYCOSYLATED HEMOGLOBIN (HGB A1C): Hemoglobin A1C: 7.8

## 2017-01-17 MED ORDER — GLUCOSE BLOOD VI STRP: ORAL_STRIP | 12 refills | Status: DC

## 2017-01-17 MED ORDER — TETANUS-DIPHTH-ACELL PERTUSSIS 5-2.5-18.5 LF-MCG/0.5 IM SUSP: 0.5000 mL | Freq: Once | INTRAMUSCULAR | Status: DC

## 2017-01-17 MED ORDER — METFORMIN HCL 500 MG PO TABS: 1000.0000 mg | ORAL_TABLET | Freq: Two times a day (BID) | ORAL | 3 refills | Status: DC

## 2017-01-17 NOTE — Assessment & Plan Note (Signed)
Slightly worsening hemoglobin A1c is 7.8, was 7.5 in February. Goal A1c between 7 and 8 so technically at goal. Self titrated insulin to Lantus 60 units daily, Victoza 1.8 mg daily, and only taking metformin 500 mg in the morning and 500 mg nightly -Discussed increasing physical activity at the [facility] -Discussed eating more fruits and vegetables and less carbohydrates -Continue Lantus 60 units daily, Victoza 1.8 mg daily, increase metformin to 1000 mg twice a day -Recheck hemoglobin A1c in 3 months

## 2017-01-17 NOTE — Assessment & Plan Note (Signed)
Controlled and at goal -Continue amlodipine and hydrochlorothiazide/lisinopril at same dose

## 2017-01-17 NOTE — Progress Notes (Signed)
Subjective:    Patient ID: Kaitlyn Ferguson , female   DOB: August 02, 1946 , 71 y.o..   MRN: [id number]  HPI  Kaitlyn Ferguson is here for  Chief Complaint  Patient presents with  . Diabetes    1. Chronic Diabetes Disease Monitoring  Blood Sugar Ranges: AM glucoses have been low 200's and 8PM glucose is in 40-140's  Polyuria: no   Visual problems: yes, She has glaucoma and cataracts according to the patient. 2 months ago had cataract surgery on her left eye. Had had laser surgery on both of there eyes for glaucoma 1 year ago.  Last hemoglobin A1C:  Lab Results  Component Value Date   HGBA1C 7.8 01/17/2017    Medication Compliance: Yes, Lantus 60 units daily (bumped it up 1 month but before that it was 56), Victoza 1.8 mg daily, Metformin 500 mg in the morning and 500 mg at night  Medication Side Effects  Hypoglycemia: no   Preventitive Health Care  Eye Exam: Had one from Dr. Marquita Palms Exam: up to date  Diet pattern: Patient notes that her diet "hasn't been good". She has been eating mostly whatever she wants. She has been trying to be better this week.   Exercise: She recently started back at the [facility] this week   2. Hypertension Blood pressure at home: Does not usually check Exercise: Recently joined the [facility] and is doing the "silver sneakers "program Low salt diet: Compliant Medications: Compliant with Amlodipine 5 mg daily, Lisinopril-HCTZ Side effects: None ROS: Denies headache, dizziness, visual changes, nausea, vomiting, chest pain, abdominal pain or shortness of breath. BP Readings from Last 3 Encounters:  01/17/17 126/80  10/04/16 120/70  07/13/16 (!) 134/56    Review of Systems: Per HPI. All other systems reviewed and are negative.  Past Medical History: Patient Active Problem List   Diagnosis Date Noted  . S/P shoulder replacement, right 07/11/2016  . Primary osteoarthritis of left knee 12/21/2015  . Preoperative clearance 11/24/2015  . History of  ankle fracture 10/04/2015  . Left knee pain 08/10/2015  . Right shoulder pain 08/10/2015  . Dizziness 05/16/2015  . Cervical strain 02/28/2015  . Diabetes mellitus without complication ([place]) 38/05/1750  . Hyperlipidemia 03/18/2007  . Hypothyroidism 10/23/2006  . OBESITY, NOS 10/23/2006  . HYPERTENSION, BENIGN SYSTEMIC 10/23/2006  . ARTHRITIS, DEGENERATIVE 10/23/2006    Medications: reviewed and updated  Social Hx:  reports that she has never smoked. She has never used smokeless tobacco.   Objective:   BP 126/80 (BP Location: Left Arm, Patient Position: Sitting, Cuff Size: Normal)   Pulse 82   Temp 98.2 F (36.8 C) (Oral)   Ht 5\' 7"  (1.702 m)   Wt 252 lb 3.2 oz (114.4 kg)   SpO2 95%   BMI 39.50 kg/m  Physical Exam  Gen: NAD, alert, cooperative with exam, well-appearing Cardiac: Regular rate and rhythm, normal S1/S2, no murmur, no edema, capillary refill brisk  Respiratory: Clear to auscultation bilaterally, no wheezes, non-labored breathing Psych: good insight, normal mood and affect  Assessment & Plan:  HYPERTENSION, BENIGN SYSTEMIC Controlled and at goal -Continue amlodipine and hydrochlorothiazide/lisinopril at same dose  Diabetes mellitus without complication (HCC) Slightly worsening hemoglobin A1c is 7.8, was 7.5 in February. Goal A1c between 7 and 8 so technically at goal. Self titrated insulin to Lantus 60 units daily, Victoza 1.8 mg daily, and only taking metformin 500 mg in the morning and 500 mg nightly -Discussed increasing physical activity at  the [employer] -Discussed eating more fruits and vegetables and less carbohydrates -Continue Lantus 60 units daily, Victoza 1.8 mg daily, increase metformin to 1000 mg twice a day -Recheck hemoglobin A1c in 3 months   Orders Placed This Encounter  Procedures  . Tdap vaccine greater than or equal to 7yo IM  . HgB A1c    Smitty Cords, MD [place], PGY-2

## 2017-01-17 NOTE — Patient Instructions (Addendum)
Thank you for coming in today, it was so nice to see you! Today we talked about:    Diabetes: Your A1C went up a little from 7.5 to 7.8. We will increase your metformin to 1000 mg in the morning and 500 at night for the next week and then the week after that do 1000 mg in the morning and 1000 mg at night.   Continue checking your feet daily  You received the tetanus booster vaccine today  Please follow up in 3 months for diabetes follow up. You can schedule this appointment at the front desk before you leave or call the clinic.  Bring in all your medications or supplements to each appointment for review.   If you have any questions or concerns, please do not hesitate to call the office at [phone]. You can also message me directly via MyChart.   Sincerely,  Smitty Cords, MD

## 2017-01-22 DIAGNOSIS — Z96652 Presence of left artificial knee joint: Secondary | ICD-10-CM | POA: Diagnosis not present

## 2017-02-07 ENCOUNTER — Encounter (HOSPITAL_COMMUNITY): Payer: Self-pay | Admitting: Emergency Medicine

## 2017-02-07 ENCOUNTER — Ambulatory Visit (INDEPENDENT_AMBULATORY_CARE_PROVIDER_SITE_OTHER): Payer: PPO | Admitting: Internal Medicine

## 2017-02-07 ENCOUNTER — Encounter: Payer: Self-pay | Admitting: Internal Medicine

## 2017-02-07 ENCOUNTER — Emergency Department (HOSPITAL_COMMUNITY)
Admission: EM | Admit: 2017-02-07 | Discharge: 2017-02-07 | Disposition: A | Discharge status: Home / Self Care | Payer: PPO | Attending: Emergency Medicine | Admitting: Emergency Medicine

## 2017-02-07 ENCOUNTER — Ambulatory Visit: Payer: PPO | Admitting: Internal Medicine

## 2017-02-07 VITALS — BP 148/78 | HR 95 | Temp 97.7°F | Wt 252.0 lb

## 2017-02-07 DIAGNOSIS — Z7984 Long term (current) use of oral hypoglycemic drugs: Secondary | ICD-10-CM | POA: Insufficient documentation

## 2017-02-07 DIAGNOSIS — Z7982 Long term (current) use of aspirin: Secondary | ICD-10-CM | POA: Insufficient documentation

## 2017-02-07 DIAGNOSIS — R310 Gross hematuria: Secondary | ICD-10-CM

## 2017-02-07 DIAGNOSIS — N3001 Acute cystitis with hematuria: Secondary | ICD-10-CM | POA: Insufficient documentation

## 2017-02-07 DIAGNOSIS — E119 Type 2 diabetes mellitus without complications: Secondary | ICD-10-CM | POA: Insufficient documentation

## 2017-02-07 DIAGNOSIS — R300 Dysuria: Secondary | ICD-10-CM

## 2017-02-07 DIAGNOSIS — Z794 Long term (current) use of insulin: Secondary | ICD-10-CM | POA: Insufficient documentation

## 2017-02-07 DIAGNOSIS — I10 Essential (primary) hypertension: Secondary | ICD-10-CM | POA: Diagnosis not present

## 2017-02-07 DIAGNOSIS — R1031 Right lower quadrant pain: Secondary | ICD-10-CM | POA: Diagnosis not present

## 2017-02-07 DIAGNOSIS — E039 Hypothyroidism, unspecified: Secondary | ICD-10-CM | POA: Diagnosis not present

## 2017-02-07 LAB — URINALYSIS, MICROSCOPIC (REFLEX)

## 2017-02-07 LAB — URINALYSIS, ROUTINE W REFLEX MICROSCOPIC
Glucose, UA: NEGATIVE mg/dL
Ketones, ur: NEGATIVE mg/dL
Nitrite: POSITIVE — AB
Protein, ur: >300 mg/dL — AB
Specific Gravity, Urine: 1.025 (ref 1.005–1.030)
pH: 6 (ref 5.0–8.0)

## 2017-02-07 LAB — POCT URINALYSIS DIP (MANUAL ENTRY)

## 2017-02-07 MED ORDER — CEPHALEXIN 250 MG PO CAPS
500.0000 mg | ORAL_CAPSULE | Freq: Once | ORAL | Status: AC
  Administered 2017-02-07: 500 mg via ORAL
  Filled 2017-02-07: qty 2

## 2017-02-07 MED ORDER — CEPHALEXIN 500 MG PO CAPS: 500.0000 mg | ORAL_CAPSULE | Freq: Four times a day (QID) | ORAL | 0 refills | Status: DC

## 2017-02-07 NOTE — Assessment & Plan Note (Addendum)
Sudden onset of pain, gross hematuria concerning and most consistent with renal calculus. UA with gross blood. Patient with 6 out of 10 pain. No history of kidney stones.  - Will have patient go to the ED for imaging given patient's significant pain and gross hematuria - obtained UA  - Based off of imaging patient could possibly benefit from Flomax and strainer - Follow up with clinic as needed - Discussed with Dr. Nori Riis

## 2017-02-07 NOTE — ED Notes (Signed)
ED Provider at bedside. 

## 2017-02-07 NOTE — ED Triage Notes (Signed)
Pt. Stated, I started this morning with buring when I urinate and my vagina burning. Im also having pain on the right side of my stomach. Pt just left Family practice.

## 2017-02-07 NOTE — ED Provider Notes (Signed)
[facility] DEPT Provider Note   CSN: [id number] Arrival date & time: 02/07/17  1511     History   Chief Complaint Chief Complaint  Patient presents with  . Abdominal Pain    right side  . Dysuria    HPI Kaitlyn Ferguson is a 71 y.o. female.  71 yo here with s/s c/w previous episodes of UTI. Has had hematuria, dysuria today that feels just like previous UTI. Went to pcp who felt she may have a kidney stone and sent her here for further evaluation.     Abdominal Pain   This is a new problem. The current episode started 3 to 5 hours ago. The problem has been gradually worsening. The pain is located in the RLQ and suprapubic region. The quality of the pain is sharp. The pain is at a severity of 6/10. The pain is mild. Associated symptoms include dysuria and hematuria. Nothing aggravates the symptoms. Nothing relieves the symptoms. Past workup does not include GI consult.  Dysuria   Associated symptoms include hematuria.    Past Medical History:  Diagnosis Date  . Arthritis   . Diabetes mellitus   . DJD (degenerative joint disease) 10/07/2008  . DJD (degenerative joint disease)   . Family history of adverse reaction to anesthesia    MOTHER WAS DIFFICULT TO WAKE UP  . Hyperlipidemia   . Hypertension   . Hypothyroidism   . Nausea    INTERMITANT - TAKES PRILOSEC (DENIES REFLUX)  . Obesity   . Osteomyelitis of ankle ([place])    Complications of Right ankle injury    Patient Active Problem List   Diagnosis Date Noted  . Hematuria, gross 02/07/2017  . S/P shoulder replacement, right 07/11/2016  . Primary osteoarthritis of left knee 12/21/2015  . Preoperative clearance 11/24/2015  . History of ankle fracture 10/04/2015  . Left knee pain 08/10/2015  . Right shoulder pain 08/10/2015  . Dizziness 05/16/2015  . Cervical strain 02/28/2015  . Diabetes mellitus without complication ([place]) 77/93/9030  . Hyperlipidemia 03/18/2007  . Hypothyroidism 10/23/2006  . OBESITY,  NOS 10/23/2006  . HYPERTENSION, BENIGN SYSTEMIC 10/23/2006  . ARTHRITIS, DEGENERATIVE 10/23/2006    Past Surgical History:  Procedure Laterality Date  . ABDOMINAL HYSTERECTOMY    . ANKLE FUSION  02/1996   Right ankle tib/talar  . BACK SURGERY  1984  . CARPAL TUNNEL RELEASE  1993   Right arm  . CHOLECYSTECTOMY    . EYE SURGERY Left   . NECK MASS EXCISION  12/29/2001  . right eye laser    . TONSILLECTOMY     Childhood  . TOTAL KNEE ARTHROPLASTY Left 12/21/2015   Procedure: LEFT TOTAL KNEE ARTHROPLASTY WITH COMPLEX COMPUTER NAVIGATION ;  Surgeon: Rod Can, MD;  Location: [facility];  Service: Orthopedics;  Laterality: Left;  . TOTAL SHOULDER ARTHROPLASTY Right 07/11/2016   Procedure: RIGHT TOTAL SHOULDER ARTHROPLASTY;  Surgeon: Justice Britain, MD;  Location: [facility];  Service: Orthopedics;  Laterality: Right;    OB History    No data available       Home Medications    Prior to Admission medications   Medication Sig Start Date End Date Taking? Authorizing Provider  amLODipine (NORVASC) 5 MG tablet take 1 tablet by mouth once daily 11/11/16   Carlyle Dolly, MD  aspirin EC 81 MG tablet Take 81 mg by mouth daily.    [provider]  B-D ULTRA-FINE 33 LANCETS MISC Check BID.  Supplies sufficient for one month 07/25/14  Hess, Tamela Oddi, DO  Blood Glucose Monitoring Suppl (ACCU-CHEK AVIVA) device Use as instructed 05/01/16 05/01/17  Carlyle Dolly, MD  cephALEXin (KEFLEX) 500 MG capsule Take 1 capsule (500 mg total) by mouth 4 (four) times daily. 02/07/17   Shaquille Janes, Corene Cornea, MD  glucose blood (FREESTYLE LITE) test strip Use as instructed 01/17/17   Carlyle Dolly, MD  ibuprofen (ADVIL,MOTRIN) 200 MG tablet Take 200 mg by mouth every 8 (eight) hours as needed (for general aches/pain.).    [provider]  Insulin Pen Needle (B-D UF III MINI PEN NEEDLES) 31G X 5 MM MISC Dispense qt sufficient for two shots daily.  Supplies sufficient for one month. 09/30/16    Carlyle Dolly, MD  LANTUS SOLOSTAR 100 UNIT/ML Solostar Pen inject 60 units subcutaneously every morning 11/27/16   Carlyle Dolly, MD  levothyroxine (SYNTHROID, LEVOTHROID) 112 MCG tablet take 1 tablet by mouth once daily Patient taking differently: Take 112 mcg by mouth daily before breakfast.  06/20/16   Carlyle Dolly, MD  liraglutide 18 MG/3ML SOPN Inject 0.3 mLs (1.8 mg total) into the skin daily. 10/04/16   Carlyle Dolly, MD  lisinopril-hydrochlorothiazide (PRINZIDE,ZESTORETIC) 20-12.5 MG tablet take 1 tablet by mouth once daily 10/16/16   Carlyle Dolly, MD  metFORMIN (GLUCOPHAGE) 500 MG tablet Take 2 tablets (1,000 mg total) by mouth 2 (two) times daily with a meal. 01/17/17   Carlyle Dolly, MD  Multiple Vitamin (MULTIVITAMIN WITH MINERALS) TABS tablet Take 1 tablet by mouth daily. With Vitamin D3    [provider]  omeprazole (PRILOSEC) 40 MG capsule Take 1 capsule (40 mg total) by mouth daily. 12/09/16   Carlyle Dolly, MD  pilocarpine (PILOCAR) 2 % ophthalmic solution Place 1 drop into the left eye 3 (three) times daily.    [provider]  rosuvastatin (CRESTOR) 20 MG tablet take 1 tablet by mouth every evening 01/08/17   Carlyle Dolly, MD    Family History Family History  Problem Relation Age of Onset  . Diabetes Brother   . Diabetes Sister        DM2-lost weight-now okay  . Heart attack Father        First MI- early 70's  . Stroke Mother   . Heart failure Mother   . Multiple myeloma Mother        Deceased at 79 due to same    Social History Social History  Substance Use Topics  . Smoking status: Never Smoker  . Smokeless tobacco: Never Used     Comment: No tobacco currently -   . Alcohol use No     Allergies   Gabapentin and Vancomycin   Review of Systems Review of Systems  Gastrointestinal: Positive for abdominal pain.  Genitourinary: Positive for difficulty urinating, dysuria and hematuria.    All other systems reviewed and are negative.    Physical Exam Updated Vital Signs BP 123/62 (BP Location: Right Arm)   Pulse 79   Temp 98.2 F (36.8 C) (Oral)   Resp 14   Ht 5' 7" (1.702 m)   Wt 114.3 kg (252 lb)   SpO2 94%   BMI 39.47 kg/m   Physical Exam  Constitutional: She is oriented to person, place, and time. She appears well-developed and well-nourished.  HENT:  Head: Normocephalic and atraumatic.  Eyes: Conjunctivae and EOM are normal.  Neck: Normal range of motion.  Cardiovascular: Normal rate and regular rhythm.   Pulmonary/Chest: No stridor. No  respiratory distress.  Abdominal: Soft. She exhibits no distension. There is no tenderness.  Musculoskeletal: Normal range of motion. She exhibits no edema, tenderness or deformity.  Neurological: She is alert and oriented to person, place, and time. No cranial nerve deficit. Coordination normal.  Skin: Skin is warm and dry.  Nursing note and vitals reviewed.    ED Treatments / Results  Labs (all labs ordered are listed, but only abnormal results are displayed) Labs Reviewed  URINALYSIS, ROUTINE W REFLEX MICROSCOPIC - Abnormal; Notable for the following:       Result Value   Color, Urine BROWN (*)    APPearance TURBID (*)    Hgb urine dipstick LARGE (*)    Bilirubin Urine SMALL (*)    Protein, ur >300 (*)    Nitrite POSITIVE (*)    Leukocytes, UA SMALL (*)    All other components within normal limits  URINALYSIS, MICROSCOPIC (REFLEX) - Abnormal; Notable for the following:    Bacteria, UA FEW (*)    Squamous Epithelial / LPF 0-5 (*)    All other components within normal limits  URINE CULTURE    EKG  EKG Interpretation None       Radiology No results found.  Procedures Procedures (including critical care time)  Medications Ordered in ED Medications  cephALEXin (KEFLEX) capsule 500 mg (500 mg Oral Given 02/07/17 2253)     Initial Impression / Assessment and Plan / ED Course  I have reviewed  the triage vital signs and the nursing notes.  Pertinent labs & imaging results that were available during my care of the patient were reviewed by me and considered in my medical decision making (see chart for details).   71 yo w/ likely UTI. Discussed doing a CT scan to eval for kidney stone but patient thinks this is exactly the same as her previous UTI's and doesn't want the cost/time of doing a CT, states if something doesn't get better or worsens, will return here or follow up with her PCP. I don't disagree with this and feel a UTI is likely the cause of symptoms and will treat appropriately.   Final Clinical Impressions(s) / ED Diagnoses   Final diagnoses:  Acute cystitis with hematuria    New Prescriptions Discharge Medication List as of 02/07/2017 10:38 PM    START taking these medications   Details  cephALEXin (KEFLEX) 500 MG capsule Take 1 capsule (500 mg total) by mouth 4 (four) times daily., Starting Fri 02/07/2017, Print         Mesner, Corene Cornea, MD 02/07/17 [id number]

## 2017-02-07 NOTE — Progress Notes (Signed)
   [facility] Kerrin Mo, MD Phone: [phone]  Reason For Visit: Gross Hematuria and Pain   # Patient presents with sudden onset of dysuria, gross hematuria, vaginal pain, right sided pelvic pain this afternoon  - Patient with gross hematuria in her urine. - States she has had dysuria this afternoon with sudden onset - No history of increased frequency or urgency of urination - Denies any significant back pain - Had a hysterectomy, noted to have uterine polyps causing menorrhagia;   - No fevers, no nausea, no vomited  - No history of renal stones -Has had UTIs before however this is not the same  Past Medical History Reviewed problem list.  Medications- reviewed and updated No additions to family history Social history- patient is a nonsmoker   Objective: BP (!) 148/78   Pulse 95   Temp 97.7 F (36.5 C) (Oral)   Wt 252 lb (114.3 kg)   BMI 39.47 kg/m  Gen: NAD, alert, cooperative with exam Cardio: regular rate and rhythm, S1S2 heard, no murmurs appreciated Pulm: clear to auscultation bilaterally, no wheezes, rhonchi or rales GI: soft, non-distended, bowel sounds present, tender along the right lower quadrant and suprapubic area, negative for CVA tenderness Skin: dry, intact, no rashes or lesions  Assessment/Plan: See problem based a/p  Hematuria, gross Sudden onset of pain, gross hematuria concerning and most consistent with renal calculus. UA with gross blood. Patient with 6 out of 10 pain. No history of kidney stones.  - Will have patient go to the ED for imaging given patient's significant pain and gross hematuria - obtained UA  - Based off of imaging patient could possibly benefit from Flomax and strainer - Follow up with clinic as needed - Discussed with Dr. Nori Riis

## 2017-02-07 NOTE — Patient Instructions (Signed)
I want you to go to the [facility] ED to be evaluated for kidney stone. Due to you having gross blood in your urine and significant pain with no history of kidney stones; I would like you to be evaluated by the ED physician with imaging of urinary tract.

## 2017-02-09 LAB — URINE CULTURE: Culture: NO GROWTH

## 2017-02-10 LAB — CBG MONITORING, ED: Glucose-Capillary: 105 mg/dL — ABNORMAL HIGH (ref 65–99)

## 2017-02-20 DIAGNOSIS — H35371 Puckering of macula, right eye: Secondary | ICD-10-CM | POA: Diagnosis not present

## 2017-02-20 DIAGNOSIS — H353134 Nonexudative age-related macular degeneration, bilateral, advanced atrophic with subfoveal involvement: Secondary | ICD-10-CM | POA: Diagnosis not present

## 2017-02-20 DIAGNOSIS — H353221 Exudative age-related macular degeneration, left eye, with active choroidal neovascularization: Secondary | ICD-10-CM | POA: Diagnosis not present

## 2017-03-05 ENCOUNTER — Encounter: Payer: Self-pay | Admitting: Family Medicine

## 2017-03-05 ENCOUNTER — Ambulatory Visit (INDEPENDENT_AMBULATORY_CARE_PROVIDER_SITE_OTHER): Payer: PPO | Admitting: Family Medicine

## 2017-03-05 VITALS — BP 118/60 | HR 75 | Temp 98.2°F | Ht 67.0 in | Wt 251.0 lb

## 2017-03-05 DIAGNOSIS — R310 Gross hematuria: Secondary | ICD-10-CM | POA: Diagnosis not present

## 2017-03-05 DIAGNOSIS — R110 Nausea: Secondary | ICD-10-CM | POA: Diagnosis not present

## 2017-03-05 DIAGNOSIS — R5383 Other fatigue: Secondary | ICD-10-CM | POA: Diagnosis not present

## 2017-03-05 MED ORDER — OMEPRAZOLE 40 MG PO CPDR: 40.0000 mg | DELAYED_RELEASE_CAPSULE | Freq: Every day | ORAL | 3 refills | Status: DC

## 2017-03-05 MED ORDER — GLUCOSE BLOOD VI STRP: ORAL_STRIP | 12 refills | Status: DC

## 2017-03-05 MED ORDER — MELATONIN 3 MG PO CAPS: 1.0000 | ORAL_CAPSULE | Freq: Every day | ORAL | 2 refills | Status: DC

## 2017-03-05 NOTE — Patient Instructions (Signed)
Thank you for coming in today, it was so nice to see you! Today we talked about:    Fatigue: We will ordered some blood work today. I have also placed a referral for a sleep study for you. So will call you to schedule this. You can try melatonin at night for now.  Nausea: I sent a refill for your heartburn medicine to her pharmacy. He continued to have nausea over the next 1-2 weeks, please come in the office or call me  If we ordered any tests today, you will be notified via telephone of any abnormalities. If everything is normal you will get a letter in the mail.   If you have any questions or concerns, please do not hesitate to call the office at [phone]. You can also message me directly via MyChart.   Sincerely,  Smitty Cords, MD

## 2017-03-05 NOTE — Assessment & Plan Note (Signed)
Has had chronic poor sleep for years now. Differentials include anemia versus poor sleep hygiene versus sleep apnea (admits to snoring, waking up coughing and choking for air, morning headaches, daytime fatigue). Could also be due to decreased physical activity and poor eating habits. -We'll order labs as below -Referral to sleep medicine due to possible sleep apnea -Discussed proper sleep hygiene -We'll try melatonin 3 mg daily at bedtime  -Follow-up in one month

## 2017-03-05 NOTE — Progress Notes (Addendum)
Subjective:    Patient ID: Kaitlyn Ferguson , female   DOB: 08-29-1945 , 71 y.o..   MRN: [id number]  HPI  Kaitlyn Ferguson is here for  Chief Complaint  Patient presents with  . Follow-up    1. UTI follow up: Patient notes that she has not had any dysuria, hematuria, pelvic pain, flank pain. All of her symptoms have resolved.  2. Fatigue: Decreased sleep for at least 10 years. No trouble falling asleep but trouble staying asleep. Soemtimes she will wake up and then have to stay up the rest of the night. For the last couple months patient feels even more tired than usual. Her husband and kids say that's snoring and waking up in the middle of the coughing or gasping for air. Has had some morning headaches. Denies any chest pain or shortness of breath. Admits to some dyspepsia. Has never had a sleep study before. Thinks she may have had anemia in the past. No hematuria or hematochezia.   3. Nausea: Patient notes that it's been going on for the last 4-5 months. She also notes that 2 weeks ago she had a bad episode of this, she felt like if she just threw up she would feel better. She did throw up a little but and then felt better after. IT's getting to the point where she doesn't want to eat breakfast. She said sometimes she will get diarrhea as well.   Review of Systems: Per HPI. All other systems reviewed and are negative.  Past Medical History: Patient Active Problem List   Diagnosis Date Noted  . Hematuria, gross 02/07/2017  . S/P shoulder replacement, right 07/11/2016  . Primary osteoarthritis of left knee 12/21/2015  . Preoperative clearance 11/24/2015  . History of ankle fracture 10/04/2015  . Left knee pain 08/10/2015  . Right shoulder pain 08/10/2015  . Dizziness 05/16/2015  . Cervical strain 02/28/2015  . Fatigue 08/13/2011  . Nausea without vomiting 06/20/2011  . Diabetes mellitus without complication ([place]) 85/88/5027  . Hyperlipidemia 03/18/2007  . Hypothyroidism  10/23/2006  . OBESITY, NOS 10/23/2006  . HYPERTENSION, BENIGN SYSTEMIC 10/23/2006  . ARTHRITIS, DEGENERATIVE 10/23/2006    Medications: reviewed   Social Hx:  reports that she has never smoked. She has never used smokeless tobacco.   Objective:   BP 118/60   Pulse 75   Temp 98.2 F (36.8 C) (Oral)   Ht 5\' 7"  (1.702 m)   Wt 251 lb (113.9 kg)   SpO2 96%   BMI 39.31 kg/m  Physical Exam  Gen: NAD, alert, cooperative with exam, well-appearing HEENT: NCAT, PERRL, clear conjunctiva, oropharynx clear, supple neck Cardiac: Regular rate and rhythm, normal S1/S2, no murmur, no edema, capillary refill brisk  Respiratory: Clear to auscultation bilaterally, no wheezes, non-labored breathing Psych: good insight, normal mood and affect  Assessment & Plan:  Hematuria, gross Hematuria as well as UTI symptoms have resolved after antibiotics. No follow-up studies needed.  Fatigue Has had chronic poor sleep for years now. Differentials include anemia versus poor sleep hygiene versus sleep apnea (admits to snoring, waking up coughing and choking for air, morning headaches, daytime fatigue). Could also be due to decreased physical activity and poor eating habits. -We'll order labs as below -Referral to sleep medicine due to possible sleep apnea -Discussed proper sleep hygiene -We'll try melatonin 3 mg daily at bedtime  -Follow-up in one month  Nausea without vomiting Nonacute and has been going on for the last couple months. Per [employer]  review has had nausea in the past intermittently. Does have history of GERD but is not taking her omeprazole. Suspect that symptoms could be due to GERD.  -Refill sent for omeprazole, patient will begin taking this today -If symptoms don't resolve in the next couple weeks can consider decreasing metformin to see if that helps  -follow-up in one month  Addendum: TSH low at 0.257. Will need to go down on Synthroid dose. Decrease Synthroid from 112 g down to 100  g. Will recheck TSH in 6-9 weeks.   Smitty Cords, MD [place], PGY-3

## 2017-03-05 NOTE — Assessment & Plan Note (Signed)
Hematuria as well as UTI symptoms have resolved after antibiotics. No follow-up studies needed.

## 2017-03-05 NOTE — Assessment & Plan Note (Addendum)
Nonacute and has been going on for the last couple months. Per Epic review has had nausea in the past intermittently. Does have history of GERD but is not taking her omeprazole. Suspect that symptoms could be due to GERD.  -Refill sent for omeprazole, patient will begin taking this today -If symptoms don't resolve in the next couple weeks can consider decreasing metformin to see if that helps  -follow-up in one month

## 2017-03-06 LAB — CBC WITH DIFFERENTIAL/PLATELET
Basophils Absolute: 0 10*3/uL (ref 0.0–0.2)
Basos: 0 %
EOS (ABSOLUTE): 0.1 10*3/uL (ref 0.0–0.4)
Eos: 1 %
Hematocrit: 38.4 % (ref 34.0–46.6)
Hemoglobin: 12.1 g/dL (ref 11.1–15.9)
Immature Grans (Abs): 0 10*3/uL (ref 0.0–0.1)
Immature Granulocytes: 0 %
Lymphocytes Absolute: 1.9 10*3/uL (ref 0.7–3.1)
Lymphs: 28 %
MCH: 24.7 pg — ABNORMAL LOW (ref 26.6–33.0)
MCHC: 31.5 g/dL (ref 31.5–35.7)
MCV: 78 fL — ABNORMAL LOW (ref 79–97)
Monocytes Absolute: 0.7 10*3/uL (ref 0.1–0.9)
Monocytes: 10 %
Neutrophils Absolute: 4.2 10*3/uL (ref 1.4–7.0)
Neutrophils: 61 %
Platelets: 291 10*3/uL (ref 150–379)
RBC: 4.9 x10E6/uL (ref 3.77–5.28)
RDW: 17.3 % — ABNORMAL HIGH (ref 12.3–15.4)
WBC: 7 10*3/uL (ref 3.4–10.8)

## 2017-03-06 LAB — COMPREHENSIVE METABOLIC PANEL
ALT: 14 IU/L (ref 0–32)
AST: 19 IU/L (ref 0–40)
Albumin/Globulin Ratio: 1.5 (ref 1.2–2.2)
Albumin: 4.1 g/dL (ref 3.5–4.8)
Alkaline Phosphatase: 82 IU/L (ref 39–117)
BUN/Creatinine Ratio: 16 (ref 12–28)
BUN: 13 mg/dL (ref 8–27)
Bilirubin Total: 0.4 mg/dL (ref 0.0–1.2)
CO2: 27 mmol/L (ref 20–29)
Calcium: 9.9 mg/dL (ref 8.7–10.3)
Chloride: 100 mmol/L (ref 96–106)
Creatinine, Ser: 0.8 mg/dL (ref 0.57–1.00)
GFR calc Af Amer: 86 mL/min/{1.73_m2} (ref >59)
GFR calc non Af Amer: 75 mL/min/{1.73_m2} (ref >59)
Globulin, Total: 2.7 g/dL (ref 1.5–4.5)
Glucose: 100 mg/dL — ABNORMAL HIGH (ref 65–99)
Potassium: 4.2 mmol/L (ref 3.5–5.2)
Sodium: 142 mmol/L (ref 134–144)
Total Protein: 6.8 g/dL (ref 6.0–8.5)

## 2017-03-06 LAB — TSH: TSH: 0.257 u[IU]/mL — ABNORMAL LOW (ref 0.450–4.500)

## 2017-03-07 ENCOUNTER — Encounter: Payer: Self-pay | Admitting: Family Medicine

## 2017-03-07 MED ORDER — LEVOTHYROXINE SODIUM 100 MCG PO TABS: 100.0000 ug | ORAL_TABLET | Freq: Every day | ORAL | 0 refills | Status: DC

## 2017-03-07 NOTE — Addendum Note (Signed)
Addended by: Carlyle Dolly on: 03/07/2017 04:26 PM   Modules accepted: Orders

## 2017-03-20 ENCOUNTER — Other Ambulatory Visit: Payer: Self-pay | Admitting: Family Medicine

## 2017-03-28 DIAGNOSIS — H353112 Nonexudative age-related macular degeneration, right eye, intermediate dry stage: Secondary | ICD-10-CM | POA: Diagnosis not present

## 2017-03-28 DIAGNOSIS — H353221 Exudative age-related macular degeneration, left eye, with active choroidal neovascularization: Secondary | ICD-10-CM | POA: Diagnosis not present

## 2017-03-28 DIAGNOSIS — H3570 Unspecified separation of retinal layers: Secondary | ICD-10-CM | POA: Diagnosis not present

## 2017-03-28 DIAGNOSIS — H35371 Puckering of macula, right eye: Secondary | ICD-10-CM | POA: Diagnosis not present

## 2017-03-28 DIAGNOSIS — E119 Type 2 diabetes mellitus without complications: Secondary | ICD-10-CM | POA: Diagnosis not present

## 2017-03-28 DIAGNOSIS — H3562 Retinal hemorrhage, left eye: Secondary | ICD-10-CM | POA: Diagnosis not present

## 2017-03-28 DIAGNOSIS — H353123 Nonexudative age-related macular degeneration, left eye, advanced atrophic without subfoveal involvement: Secondary | ICD-10-CM | POA: Diagnosis not present

## 2017-04-08 ENCOUNTER — Ambulatory Visit: Payer: PPO | Admitting: Student

## 2017-04-09 ENCOUNTER — Encounter: Payer: Self-pay | Admitting: Internal Medicine

## 2017-04-09 ENCOUNTER — Ambulatory Visit (INDEPENDENT_AMBULATORY_CARE_PROVIDER_SITE_OTHER): Payer: PPO | Admitting: Internal Medicine

## 2017-04-09 VITALS — BP 101/80 | HR 66 | Temp 98.0°F | Ht 67.0 in | Wt 249.2 lb

## 2017-04-09 DIAGNOSIS — R300 Dysuria: Secondary | ICD-10-CM | POA: Diagnosis not present

## 2017-04-09 LAB — POCT URINALYSIS DIP (MANUAL ENTRY)
Bilirubin, UA: NEGATIVE
Glucose, UA: NEGATIVE mg/dL
Ketones, POC UA: NEGATIVE mg/dL
Nitrite, UA: NEGATIVE
Protein Ur, POC: 100 mg/dL — AB
Spec Grav, UA: 1.025 (ref 1.010–1.025)
Urobilinogen, UA: 0.2 E.U./dL
pH, UA: 6 (ref 5.0–8.0)

## 2017-04-09 LAB — POCT UA - MICROSCOPIC ONLY

## 2017-04-09 MED ORDER — CEPHALEXIN 500 MG PO CAPS: 500.0000 mg | ORAL_CAPSULE | Freq: Four times a day (QID) | ORAL | 0 refills | Status: DC

## 2017-04-09 MED ORDER — PHENAZOPYRIDINE HCL 100 MG PO TABS: 100.0000 mg | ORAL_TABLET | Freq: Three times a day (TID) | ORAL | 0 refills | Status: DC | PRN

## 2017-04-09 NOTE — Progress Notes (Signed)
   Subjective:   Patient: Kaitlyn Ferguson       Birthdate: 11-05-1945       MRN: [id number]      HPI  Kaitlyn Ferguson is a 71 y.o. female presenting for same day appointment for dysuria.   Dysuria Began about a week ago as more of a pressure sensation, however has worsened in the past two days. Says she had a UTI about two months ago and current symptoms feel same as her symptoms then. Was prescribed Keflex at that time with quick improvement in symptoms. Denies hematuria. Endorses some abdominal pain which she describes as heaviness. Denies fevers, vomiting. Does endorse nausea. Endorses diarrhea but says this is a chronic issue. Has taken Pepto Bismol for nausea but has not tried anything else.   Smoking status reviewed. Patient is never smoker.   Review of Systems See HPI.     Objective:  Physical Exam  Constitutional: She is oriented to person, place, and time and well-developed, well-nourished, and in no distress.  HENT:  Head: Normocephalic and atraumatic.  Eyes: Conjunctivae and EOM are normal.  Pulmonary/Chest: Effort normal. No respiratory distress.  Abdominal: Soft. Bowel sounds are normal. She exhibits no distension.  Mild suprapubic tenderness to palpation. Negative CVA tenderness bilaterally.   Neurological: She is alert and oriented to person, place, and time.  Psychiatric: Affect and judgment normal.      Assessment & Plan:  Dysuria Both symptoms and UA consistent with UTI. UA with nitrites, leuks, and hemoglobin. Patient afebrile and well-appearing, as well as no CVA tenderness on exam so less concern for pyelonephritis at this time. As patient responded well to Keflex with last UTI two months ago, will prescribe course of Keflex again. Also will prescribe pyridium for symptomatic relief. Patient instructed to call if no improvement with abx or if develops worsening symptoms, fevers, abd pain, N/V.    Adin Hector, MD, MPH PGY-3 [facility] Pager [phone]

## 2017-04-09 NOTE — Assessment & Plan Note (Addendum)
Both symptoms and UA consistent with UTI. UA with nitrites, leuks, and hemoglobin. Patient afebrile and well-appearing, as well as no CVA tenderness on exam so less concern for pyelonephritis at this time. As patient responded well to Keflex with last UTI two months ago, will prescribe course of Keflex again. Also will prescribe pyridium for symptomatic relief. Patient instructed to call if no improvement with abx or if develops worsening symptoms, fevers, abd pain, N/V.

## 2017-04-09 NOTE — Patient Instructions (Addendum)
It was nice meeting you today Ms. Boonstra!  Please begin taking your antibiotics (Keflex) every 6 hours for the next week. For discomfort with urination, you can take one pyridium tablet up to ever 8 hours. This may change the color of your urine, so do not be alarmed. It is important to drink plenty of water to make sure you are staying hydrated.   If your symptoms are not getting better with antibiotics, or if you develop a fever, chills, or worsening abdominal pain, please call our office or go to the emergency room.   If you have any questions or concerns, please feel free to call the clinic.   Be well,  Dr. Avon Gully

## 2017-04-15 ENCOUNTER — Other Ambulatory Visit: Payer: Self-pay | Admitting: Family Medicine

## 2017-04-22 ENCOUNTER — Other Ambulatory Visit: Payer: Self-pay | Admitting: Family Medicine

## 2017-05-02 ENCOUNTER — Other Ambulatory Visit: Payer: Self-pay | Admitting: Family Medicine

## 2017-05-07 DIAGNOSIS — H353221 Exudative age-related macular degeneration, left eye, with active choroidal neovascularization: Secondary | ICD-10-CM | POA: Diagnosis not present

## 2017-05-07 DIAGNOSIS — H353112 Nonexudative age-related macular degeneration, right eye, intermediate dry stage: Secondary | ICD-10-CM | POA: Diagnosis not present

## 2017-05-07 DIAGNOSIS — H353123 Nonexudative age-related macular degeneration, left eye, advanced atrophic without subfoveal involvement: Secondary | ICD-10-CM | POA: Diagnosis not present

## 2017-05-07 DIAGNOSIS — H40031 Anatomical narrow angle, right eye: Secondary | ICD-10-CM | POA: Diagnosis not present

## 2017-05-07 NOTE — Progress Notes (Signed)
Subjective:    Patient ID: Kaitlyn Ferguson , female   DOB: 01-28-1946 , 71 y.o..   MRN: [id number]  HPI  Kaitlyn Ferguson is here for  Chief Complaint  Patient presents with  . Diabetes    1. Chronic nausea and diarrhea: This has been going on for a few years now. Feels like she needs to vomit but no actual emesis, has eliminated spicy foods. No abdominal pain. Had gallbladder removed. Admits to some diarrhea and dark stools. Has been taking Pepto. Has never seen GI. Is on Prilosec but doesn't help, takes once a day. Has lots of belching and sometimes feeling of a sour stomach.   2. Chronic Diabetes  Disease Monitoring  Blood Sugar Ranges: Glucose in AM is in the 200s  Polyuria: no   Visual problems: yes, sees her eye doctor    Last hemoglobin A1C:  Lab Results  Component Value Date   HGBA1C 7.4 05/08/2017    Medication Compliance: yes  Medication Side Effects  Hypoglycemia: no   Preventitive Health Care  Eye Exam: Goes to Dr. Zadie Rhine and gets shot in her eye   Foot Exam: Up to date   Diet pattern: eating less fried/fatty foods and cutting back on sweets  Exercise: none   3. Hypothyroidism: Current symptoms: diarrhea . Patient denies change in energy level, heat / cold intolerance, nervousness and palpitations. Symptoms have been well-controlled.  Review of Systems: Per HPI.   Past Medical History: Patient Active Problem List   Diagnosis Date Noted  . Dysuria 04/09/2017  . Hematuria, gross 02/07/2017  . S/P shoulder replacement, right 07/11/2016  . Primary osteoarthritis of left knee 12/21/2015  . Preoperative clearance 11/24/2015  . History of ankle fracture 10/04/2015  . Left knee pain 08/10/2015  . Right shoulder pain 08/10/2015  . Dizziness 05/16/2015  . Cervical strain 02/28/2015  . Fatigue 08/13/2011  . Nausea without vomiting 06/20/2011  . Diabetes mellitus without complication ([place]) 53/61/4431  . Hyperlipidemia 03/18/2007  . Hypothyroidism  10/23/2006  . OBESITY, NOS 10/23/2006  . HYPERTENSION, BENIGN SYSTEMIC 10/23/2006  . ARTHRITIS, DEGENERATIVE 10/23/2006    Medications: reviewed and updated Current Outpatient Prescriptions  Medication Sig Dispense Refill  . amLODipine (NORVASC) 5 MG tablet take 1 tablet by mouth once daily 30 tablet 3  . aspirin EC 81 MG tablet Take 81 mg by mouth daily.    . B-D ULTRA-FINE 33 LANCETS MISC Check BID.  Supplies sufficient for one month 100 each 11  . cephALEXin (KEFLEX) 500 MG capsule Take 1 capsule (500 mg total) by mouth 4 (four) times daily. 28 capsule 0  . cephALEXin (KEFLEX) 500 MG capsule Take 1 capsule (500 mg total) by mouth 4 (four) times daily. 28 capsule 0  . glucose blood (FREESTYLE LITE) test strip Twice a day 400 each 12  . ibuprofen (ADVIL,MOTRIN) 200 MG tablet Take 200 mg by mouth every 8 (eight) hours as needed (for general aches/pain.).    . Insulin Pen Needle (B-D UF III MINI PEN NEEDLES) 31G X 5 MM MISC Dispense qt sufficient for two shots daily.  Supplies sufficient for one month. 100 each 11  . LANTUS SOLOSTAR 100 UNIT/ML Solostar Pen INJECT 60 UNITS SUBCUTANEOUSLY EVERY MORNING 45 mL 1  . levothyroxine (SYNTHROID, LEVOTHROID) 88 MCG tablet Take 1 tablet (88 mcg total) by mouth daily before breakfast. take 1 tablet by mouth once daily 30 tablet 3  . lisinopril-hydrochlorothiazide (PRINZIDE,ZESTORETIC) 20-12.5 MG tablet take 1 tablet by mouth  once daily 90 tablet 1  . Melatonin 3 MG CAPS Take 1 capsule (3 mg total) by mouth at bedtime. 30 capsule 2  . metFORMIN (GLUCOPHAGE) 500 MG tablet Take 2 tablets (1,000 mg total) by mouth 2 (two) times daily with a meal. 180 tablet 3  . metoCLOPramide (REGLAN) 5 MG tablet Take 1 tablet (5 mg total) by mouth 4 (four) times daily. 30 tablet 0  . Multiple Vitamin (MULTIVITAMIN WITH MINERALS) TABS tablet Take 1 tablet by mouth daily. With Vitamin D3    . omeprazole (PRILOSEC) 40 MG capsule Take 1 capsule (40 mg total) by mouth 2 (two)  times daily. 60 capsule 3  . phenazopyridine (PYRIDIUM) 100 MG tablet Take 1 tablet (100 mg total) by mouth 3 (three) times daily as needed for pain. 10 tablet 0  . pilocarpine (PILOCAR) 2 % ophthalmic solution Place 1 drop into the left eye 3 (three) times daily.    . rosuvastatin (CRESTOR) 20 MG tablet take 1 tablet by mouth every evening 30 tablet 3  . VICTOZA 18 MG/3ML SOPN inject 0.3 milliliters (1.8 MG TOTAL) subcutaneously once daily 9 mL 11   No current facility-administered medications for this visit.     Social Hx:  reports that she has never smoked. She has never used smokeless tobacco.   Objective:   BP 125/76   Pulse 72   Temp 98.5 F (36.9 C) (Oral)   Ht 5\' 7"  (1.702 m)   Wt 246 lb 12.8 oz (111.9 kg)   SpO2 100%   BMI 38.65 kg/m  Physical Exam  Gen: NAD, alert, cooperative with exam, well-appearing HEENT: NCAT, PERRL, clear conjunctiva, oropharynx clear, supple neck, wears glasses Gastrointestinal: soft, non tender, non distended, bowel sounds present Psych: good insight, normal mood and affect  Results for orders placed or performed in visit on 05/08/17  TSH  Result Value Ref Range   TSH 0.177 (L) 0.450 - 4.500 uIU/mL  HgB A1c  Result Value Ref Range   Hemoglobin A1C 7.4     Assessment & Plan:  Hypothyroidism Asymptomatic but does admit to some diarrhea that has been chronic for her. TSH low today at 0.177. Currently taking synthroid 100 mcg daily - Decrease synthroid to 88 mcg daily  - Recheck TSH in 4-6 weeks  Diabetes mellitus without complication (HCC) H9Q at goal (goal 7-8). A1C today 7.4.  - Continue current medication regimen with metformin, Lantus, and Victoza - Can potentially stop victoza if A1C continues to downtrend at next diabetes visit  Nausea without vomiting Associated with diarrhea sometimes. Differentials include dyspepsia vs GERD vs gastroparesis from diabetes. Less likely from metformin given that this is a chronic medication but  that is always a possibility. - Increase omeprazole to twice daily dosing - Reglan PRN, will only try for a couple weeks - Smaller lighter meals - Follow up in 4-6 weeks - referral placed to GI for colonoscopy  Orders Placed This Encounter  Procedures  . Flu Vaccine QUAD 36+ mos IM  . TSH  . Ambulatory referral to Gastroenterology    Referral Priority:   Routine    Referral Type:   Consultation    Referral Reason:   Specialty Services Required    Number of Visits Requested:   1  . HgB A1c   Meds ordered this encounter  Medications  . DISCONTD: omeprazole (PRILOSEC) 40 MG capsule    Sig: Take 1 capsule (40 mg total) by mouth 2 (two) times daily.  Dispense:  30 capsule    Refill:  3  . metoCLOPramide (REGLAN) 5 MG tablet    Sig: Take 1 tablet (5 mg total) by mouth 4 (four) times daily.    Dispense:  30 tablet    Refill:  0  . omeprazole (PRILOSEC) 40 MG capsule    Sig: Take 1 capsule (40 mg total) by mouth 2 (two) times daily.    Dispense:  60 capsule    Refill:  3    Smitty Cords, MD [place], PGY-3

## 2017-05-08 ENCOUNTER — Encounter: Payer: Self-pay | Admitting: Family Medicine

## 2017-05-08 ENCOUNTER — Ambulatory Visit (INDEPENDENT_AMBULATORY_CARE_PROVIDER_SITE_OTHER): Payer: PPO | Admitting: Family Medicine

## 2017-05-08 VITALS — BP 125/76 | HR 72 | Temp 98.5°F | Ht 67.0 in | Wt 246.8 lb

## 2017-05-08 DIAGNOSIS — Z23 Encounter for immunization: Secondary | ICD-10-CM | POA: Diagnosis not present

## 2017-05-08 DIAGNOSIS — E039 Hypothyroidism, unspecified: Secondary | ICD-10-CM

## 2017-05-08 DIAGNOSIS — E119 Type 2 diabetes mellitus without complications: Secondary | ICD-10-CM

## 2017-05-08 DIAGNOSIS — Z1211 Encounter for screening for malignant neoplasm of colon: Secondary | ICD-10-CM

## 2017-05-08 DIAGNOSIS — R110 Nausea: Secondary | ICD-10-CM

## 2017-05-08 LAB — POCT GLYCOSYLATED HEMOGLOBIN (HGB A1C): Hemoglobin A1C: 7.4

## 2017-05-08 MED ORDER — OMEPRAZOLE 40 MG PO CPDR: 40.0000 mg | DELAYED_RELEASE_CAPSULE | Freq: Two times a day (BID) | ORAL | 3 refills | Status: DC

## 2017-05-08 MED ORDER — METOCLOPRAMIDE HCL 5 MG PO TABS: 5.0000 mg | ORAL_TABLET | Freq: Four times a day (QID) | ORAL | 0 refills | Status: DC

## 2017-05-08 NOTE — Patient Instructions (Signed)
Thank you for coming in today, it was so nice to see you! Today we talked about:    Diabetes: Congratulations on lowering her A1c. Keep up the good work. Continue taking your diabetes medicines as prescribed  Nausea and belching: You likely have dyspepsia. We've increased your omeprazole to twice a day. I sent this prescription and your pharmacy. I have also prescribed you a nausea medication called Reglan.   I placed a referral for a gastroenterologist. They will likely need to do a repeat colonoscopy on you. Someone will  call you to schedule this appointment.   Please follow up in 3 months for diabetes. You can schedule this appointment at the front desk before you leave or call the clinic.  Bring in all your medications or supplements to each appointment for review.   If we ordered any tests today, you will be notified via telephone of any abnormalities. If everything is normal you will get a letter in the mail.   If you have any questions or concerns, please do not hesitate to call the office at [phone]. You can also message me directly via MyChart.   Sincerely,  Smitty Cords, MD

## 2017-05-09 LAB — TSH: TSH: 0.177 u[IU]/mL — ABNORMAL LOW (ref 0.450–4.500)

## 2017-05-13 ENCOUNTER — Telehealth: Payer: Self-pay | Admitting: Family Medicine

## 2017-05-13 MED ORDER — LEVOTHYROXINE SODIUM 88 MCG PO TABS: 88.0000 ug | ORAL_TABLET | Freq: Every day | ORAL | 3 refills | Status: DC

## 2017-05-13 NOTE — Assessment & Plan Note (Addendum)
Associated with diarrhea sometimes. Differentials include dyspepsia vs GERD vs gastroparesis from diabetes. Less likely from metformin given that this is a chronic medication but that is always a possibility. - Increase omeprazole to twice daily dosing - Reglan PRN, will only try for a couple weeks - Smaller lighter meals - Follow up in 4-6 weeks - referral placed to GI for colonoscopy

## 2017-05-13 NOTE — Assessment & Plan Note (Signed)
A1C at goal (goal 7-8). A1C today 7.4.  - Continue current medication regimen with metformin, Lantus, and Victoza - Can potentially stop victoza if A1C continues to downtrend at next diabetes visit

## 2017-05-13 NOTE — Assessment & Plan Note (Signed)
Asymptomatic but does admit to some diarrhea that has been chronic for her. TSH low today at 0.177. Currently taking synthroid 100 mcg daily - Decrease synthroid to 88 mcg daily  - Recheck TSH in 4-6 weeks

## 2017-05-13 NOTE — Telephone Encounter (Signed)
Called patient to discuss low TSH. We will need to go down on her synthroid again. Sent new refill for 88 mcg to pharmacy. She will follow up with me in 6 weeks.   Smitty Cords, MD [place], PGY-3

## 2017-05-17 ENCOUNTER — Other Ambulatory Visit: Payer: Self-pay | Admitting: Family Medicine

## 2017-05-28 DIAGNOSIS — E119 Type 2 diabetes mellitus without complications: Secondary | ICD-10-CM | POA: Diagnosis not present

## 2017-05-28 DIAGNOSIS — R197 Diarrhea, unspecified: Secondary | ICD-10-CM | POA: Diagnosis not present

## 2017-05-28 DIAGNOSIS — I10 Essential (primary) hypertension: Secondary | ICD-10-CM | POA: Diagnosis not present

## 2017-05-28 DIAGNOSIS — Z86010 Personal history of colonic polyps: Secondary | ICD-10-CM | POA: Diagnosis not present

## 2017-05-30 ENCOUNTER — Other Ambulatory Visit: Payer: Self-pay | Admitting: Family Medicine

## 2017-05-30 ENCOUNTER — Institutional Professional Consult (permissible substitution): Payer: PPO | Admitting: Pulmonary Disease

## 2017-05-30 DIAGNOSIS — E119 Type 2 diabetes mellitus without complications: Secondary | ICD-10-CM

## 2017-06-04 ENCOUNTER — Telehealth: Payer: Self-pay | Admitting: Family Medicine

## 2017-06-04 NOTE — Telephone Encounter (Signed)
[facility] is calling to let the doctor know that the patient refused to make an appointment. jw

## 2017-06-10 DIAGNOSIS — D120 Benign neoplasm of cecum: Secondary | ICD-10-CM | POA: Diagnosis not present

## 2017-06-10 DIAGNOSIS — K635 Polyp of colon: Secondary | ICD-10-CM | POA: Diagnosis not present

## 2017-06-10 DIAGNOSIS — D123 Benign neoplasm of transverse colon: Secondary | ICD-10-CM | POA: Diagnosis not present

## 2017-06-10 DIAGNOSIS — Z86010 Personal history of colonic polyps: Secondary | ICD-10-CM | POA: Diagnosis not present

## 2017-06-10 DIAGNOSIS — D124 Benign neoplasm of descending colon: Secondary | ICD-10-CM | POA: Diagnosis not present

## 2017-06-10 DIAGNOSIS — D125 Benign neoplasm of sigmoid colon: Secondary | ICD-10-CM | POA: Diagnosis not present

## 2017-06-12 DIAGNOSIS — H4052X4 Glaucoma secondary to other eye disorders, left eye, indeterminate stage: Secondary | ICD-10-CM | POA: Diagnosis not present

## 2017-06-12 DIAGNOSIS — H40031 Anatomical narrow angle, right eye: Secondary | ICD-10-CM | POA: Diagnosis not present

## 2017-06-12 DIAGNOSIS — H353123 Nonexudative age-related macular degeneration, left eye, advanced atrophic without subfoveal involvement: Secondary | ICD-10-CM | POA: Diagnosis not present

## 2017-06-12 DIAGNOSIS — H353221 Exudative age-related macular degeneration, left eye, with active choroidal neovascularization: Secondary | ICD-10-CM | POA: Diagnosis not present

## 2017-06-13 ENCOUNTER — Telehealth: Payer: Self-pay | Admitting: *Deleted

## 2017-06-13 NOTE — Telephone Encounter (Signed)
Patient left message on nurse line stating she will be getting new insurance which won't cover lantus. Requesting return call from PCP to discuss options. Hubbard Hartshorn, RN, BSN

## 2017-06-16 ENCOUNTER — Telehealth: Payer: Self-pay | Admitting: Family Medicine

## 2017-06-16 DIAGNOSIS — H40033 Anatomical narrow angle, bilateral: Secondary | ICD-10-CM | POA: Diagnosis not present

## 2017-06-16 DIAGNOSIS — H2513 Age-related nuclear cataract, bilateral: Secondary | ICD-10-CM | POA: Diagnosis not present

## 2017-06-16 DIAGNOSIS — H43813 Vitreous degeneration, bilateral: Secondary | ICD-10-CM | POA: Diagnosis not present

## 2017-06-16 DIAGNOSIS — H40053 Ocular hypertension, bilateral: Secondary | ICD-10-CM | POA: Diagnosis not present

## 2017-06-16 DIAGNOSIS — Z961 Presence of intraocular lens: Secondary | ICD-10-CM | POA: Diagnosis not present

## 2017-06-16 NOTE — Telephone Encounter (Signed)
Pt is calling because she takes Lantus and her new supplemental insurance doesn't cover that particular one. Is there something that would be compatible. Please call patient to discuss her options. jw

## 2017-06-16 NOTE — Telephone Encounter (Signed)
Called patient back. She stated she is switching insurances in December and that her Lantus will not be covered when she switches. Discussed that we could potentially start her on insulin glargine instead of lantus. She states she is going to talk to her insurance tomorrow and see if they will cover that. She will let me know. In the meantime she is going to continue taking Lantus.   Smitty Cords, MD [place], PGY-3

## 2017-06-23 NOTE — Telephone Encounter (Signed)
Patient calling again requesting to speak with MD regarding Lantus

## 2017-06-24 NOTE — Telephone Encounter (Signed)
Patient states she called her insurance and they will no longer cover Lantus in a couple months but they will cover Basaglar. Asked patient if she would like me to switch her to Nancee Liter now, she prefers to wait until her appt in December to make the switch.   Smitty Cords, MD [place], PGY-3

## 2017-07-10 ENCOUNTER — Telehealth: Payer: Self-pay | Admitting: Internal Medicine

## 2017-07-11 NOTE — Telephone Encounter (Signed)
Spoke w/patient and she said she changed her mind.  She had her colon done at Dr. Ulyses Amor office

## 2017-07-27 ENCOUNTER — Other Ambulatory Visit: Payer: Self-pay | Admitting: Family Medicine

## 2017-07-29 DIAGNOSIS — H353221 Exudative age-related macular degeneration, left eye, with active choroidal neovascularization: Secondary | ICD-10-CM | POA: Diagnosis not present

## 2017-07-29 DIAGNOSIS — H353112 Nonexudative age-related macular degeneration, right eye, intermediate dry stage: Secondary | ICD-10-CM | POA: Diagnosis not present

## 2017-07-29 DIAGNOSIS — H40031 Anatomical narrow angle, right eye: Secondary | ICD-10-CM | POA: Diagnosis not present

## 2017-07-29 DIAGNOSIS — H35371 Puckering of macula, right eye: Secondary | ICD-10-CM | POA: Diagnosis not present

## 2017-07-29 DIAGNOSIS — H353123 Nonexudative age-related macular degeneration, left eye, advanced atrophic without subfoveal involvement: Secondary | ICD-10-CM | POA: Diagnosis not present

## 2017-07-30 DIAGNOSIS — G8929 Other chronic pain: Secondary | ICD-10-CM | POA: Diagnosis not present

## 2017-07-30 DIAGNOSIS — Z96611 Presence of right artificial shoulder joint: Secondary | ICD-10-CM | POA: Diagnosis not present

## 2017-07-30 DIAGNOSIS — Z471 Aftercare following joint replacement surgery: Secondary | ICD-10-CM | POA: Diagnosis not present

## 2017-07-30 DIAGNOSIS — M25511 Pain in right shoulder: Secondary | ICD-10-CM | POA: Diagnosis not present

## 2017-08-05 ENCOUNTER — Ambulatory Visit: Payer: PPO | Admitting: Family Medicine

## 2017-08-11 ENCOUNTER — Encounter: Payer: Self-pay | Admitting: Family Medicine

## 2017-08-11 ENCOUNTER — Other Ambulatory Visit: Payer: Self-pay

## 2017-08-11 ENCOUNTER — Ambulatory Visit: Payer: PPO | Admitting: Family Medicine

## 2017-08-11 VITALS — BP 118/62 | HR 75 | Temp 97.5°F | Ht 67.0 in | Wt 254.0 lb

## 2017-08-11 DIAGNOSIS — E039 Hypothyroidism, unspecified: Secondary | ICD-10-CM | POA: Diagnosis not present

## 2017-08-11 DIAGNOSIS — E119 Type 2 diabetes mellitus without complications: Secondary | ICD-10-CM | POA: Diagnosis not present

## 2017-08-11 LAB — POCT GLYCOSYLATED HEMOGLOBIN (HGB A1C): Hemoglobin A1C: 8

## 2017-08-11 MED ORDER — FREESTYLE LANCETS MISC: 1.0000 | Freq: Two times a day (BID) | 99 refills | Status: DC

## 2017-08-11 MED ORDER — METFORMIN HCL 500 MG PO TABS: 1000.0000 mg | ORAL_TABLET | Freq: Two times a day (BID) | ORAL | 3 refills | Status: DC

## 2017-08-11 NOTE — Patient Instructions (Signed)
Thank you for coming in today, it was so nice to see you! Today we talked about:    Diabetes: Continue to try and exercise and take your medications as prescribed. Please follow up with dr. Valentina Lucks for continuous glucose monitoring   We are checking your thyroid today  Your foot exam is normal  Let me know when you need to switch insulins  Please follow up in the next couple weeks with Dr. Valentina Lucks and in 3 months with Dr. Juanito Doom. You can schedule this appointment at the front desk before you leave or call the clinic.  Bring in all your medications or supplements to each appointment for review.   If we ordered any tests today, you will be notified via telephone of any abnormalities. If everything is normal you will get a letter in the mail.   If you have any questions or concerns, please do not hesitate to call the office at [phone]. You can also message me directly via MyChart.   Sincerely,  Smitty Cords, MD

## 2017-08-11 NOTE — Progress Notes (Signed)
Subjective:    Patient ID: Kaitlyn Ferguson , female   DOB: 1945/11/15 , 71 y.o..   MRN: [id number]  HPI  Kaitlyn Ferguson is a 71 yo F with PMH of  here for  Chief Complaint  Patient presents with  . Diabetes    1. Chronic Diabetes  Disease Monitoring  Blood Sugar Ranges: Mid 150s last week, this week has been in the 200s when she was not on her metformin  Polyuria: no   Visual problems: yes, has cataracts.  Follows with Dr. Katy Fitch  Last hemoglobin A1C:  Lab Results  Component Value Date   HGBA1C 8.0 08/11/2017    Medication Compliance: yes, she did run out of her metformin early last week because she did not get enough pills Medication Side Effects  Hypoglycemia: no   Preventitive Health Care  Eye Exam: overdue, going on the 1st week of January with Dr. Laury Axon Exam: up to date   Diet pattern: has been elimating sugar from beverages   Exercise: No exercising  Colonoscopy: She states she did have 8 polyps, she said everything was fine though. Went to Dr. Benson [place]. They want to see her back in 3 years. They want to see her back in 3 years   Review of Systems: Per HPI.   Health Maintenance Due  Topic Date Due  . PNA vac Low Risk Adult (1 of 2 - PCV13) 04/07/2011  . OPHTHALMOLOGY EXAM  12/25/2014  . LIPID PANEL  02/04/2017  . FOOT EXAM  05/23/2017    Past Medical History: Patient Active Problem List   Diagnosis Date Noted  . Dysuria 04/09/2017  . S/P shoulder replacement, right 07/11/2016  . Primary osteoarthritis of left knee 12/21/2015  . History of ankle fracture 10/04/2015  . Dizziness 05/16/2015  . Cervical strain 02/28/2015  . Fatigue 08/13/2011  . Nausea without vomiting 06/20/2011  . Diabetes mellitus without complication ([place]) 97/09/6376  . Hyperlipidemia 03/18/2007  . Hypothyroidism 10/23/2006  . OBESITY, NOS 10/23/2006  . HYPERTENSION, BENIGN SYSTEMIC 10/23/2006  . ARTHRITIS, DEGENERATIVE 10/23/2006    Medications: reviewed   Social  Hx:  reports that  has never smoked. she has never used smokeless tobacco.   Objective:   BP 118/62   Pulse 75   Temp (!) 97.5 F (36.4 C) (Oral)   Ht 5\' 7"  (1.702 m)   Wt 254 lb (115.2 kg)   SpO2 96%   BMI 39.78 kg/m  Physical Exam  Gen: NAD, alert, cooperative with exam, well-appearing Psych: good insight, normal mood and affect Diabetic Foot Exam - Simple   Simple Foot Form Diabetic Foot exam was performed with the following findings:  Yes 08/11/2017  4:43 PM  Visual Inspection No deformities, no ulcerations, no other skin breakdown bilaterally:  Yes Sensation Testing Intact to touch and monofilament testing bilaterally:  Yes Pulse Check Posterior Tibialis and Dorsalis pulse intact bilaterally:  Yes Comments      Assessment & Plan:  Hypothyroidism Last TSH was low and Synthroid was decreased. -Rechecking TSH today -Continue Synthroid 88 mcg daily for now  Diabetes mellitus without complication (HCC) H8I at goal today.  Goal A1c is between 7 and 8.  A1c 8.0 today. -Continue current regimen of metformin, Lantus, Victoza - Discussed increasing exercise - Follow-up in 3 months for next A1c check -Patient notes that next year she is getting new insurance and they may not cover Lantus but they will cover basiglar.  She will call if  she needs this medication changed.  Orders Placed This Encounter  Procedures  . TSH  . HgB A1c   Meds ordered this encounter  Medications  . metFORMIN (GLUCOPHAGE) 500 MG tablet    Sig: Take 2 tablets (1,000 mg total) by mouth 2 (two) times daily with a meal.    Dispense:  180 tablet    Refill:  3  . Lancets (FREESTYLE) lancets    Sig: 1 each by Other route 2 (two) times daily. Use as instructed    Dispense:  100 each    Refill:  PRN    Smitty Cords, MD [place], PGY-3

## 2017-08-11 NOTE — Assessment & Plan Note (Signed)
A1c at goal today.  Goal A1c is between 7 and 8.  A1c 8.0 today. -Continue current regimen of metformin, Lantus, Victoza - Discussed increasing exercise - Follow-up in 3 months for next A1c check -Patient notes that next year she is getting new insurance and they may not cover Lantus but they will cover basiglar.  She will call if she needs this medication changed.

## 2017-08-11 NOTE — Assessment & Plan Note (Signed)
Last TSH was low and Synthroid was decreased. -Rechecking TSH today -Continue Synthroid 88 mcg daily for now

## 2017-08-12 ENCOUNTER — Telehealth: Payer: Self-pay | Admitting: *Deleted

## 2017-08-12 LAB — TSH: TSH: 2.16 u[IU]/mL (ref 0.450–4.500)

## 2017-08-12 NOTE — Telephone Encounter (Signed)
Patient left message on nurse line requesting return call to discuss rx written yesterday for metformin and lancets. Returned call. Pharmacy is saying insurance won't pay for these rx as written. Will contact pharmacy tomorrow for clarification. Hubbard Hartshorn, RN, BSN

## 2017-08-13 NOTE — Telephone Encounter (Signed)
Spoke with Jasmine at [facility]. States patient picked up 3 month supply of metformin on 06/06/2017 so insurance won't pay for refill for 3 weeks. Also, has 13 days left on lancets before insurance will pay. Left message on patient's VM requesting return call to relay information. Hubbard Hartshorn, RN, BSN

## 2017-08-15 ENCOUNTER — Encounter: Payer: Self-pay | Admitting: Family Medicine

## 2017-08-28 ENCOUNTER — Encounter: Payer: Self-pay | Admitting: Pharmacist

## 2017-08-28 ENCOUNTER — Ambulatory Visit (INDEPENDENT_AMBULATORY_CARE_PROVIDER_SITE_OTHER): Payer: Medicare HMO | Admitting: Pharmacist

## 2017-08-28 DIAGNOSIS — I10 Essential (primary) hypertension: Secondary | ICD-10-CM

## 2017-08-28 DIAGNOSIS — E119 Type 2 diabetes mellitus without complications: Secondary | ICD-10-CM

## 2017-08-28 MED ORDER — INSULIN DEGLUDEC 100 UNIT/ML ~~LOC~~ SOPN: 60.0000 [IU] | PEN_INJECTOR | Freq: Every day | SUBCUTANEOUS | 11 refills | Status: DC

## 2017-08-28 NOTE — Assessment & Plan Note (Signed)
Diabetes longstanding currently uncontrolled as evidenced by A1C worsened to 8%. Patient denies hypoglycemic events and is able to verbalize appropriate hypoglycemia management plan. Patient reports adherence with medication. Control is suboptimal due to sedentary lifestyle, dietary indiscretion, insulin resistance. No insurance coverage for CGM at this time, out of pocket cost for [employer] Professional Continuous Glucose Monitor determined to be unaffordable by patient.  -Change Lantus to [place] at 60 units daily (equivalent dose) for insurance preference and improved pharmacokinetic profile.  -Continue victoza 1.8 mg daily and metformin 1000 mg BID -Discussed CGM options with patient at length, patient to think about this for now -Counseled on increasing exercise and dietary changes that can be made to control BP

## 2017-08-28 NOTE — Progress Notes (Signed)
Patient ID: Kaitlyn Ferguson, female   DOB: 02-Jun-1946, 72 y.o.   MRN: [id number] Reviewed: Agree with Dr. Graylin Shiver documentation and management.

## 2017-08-28 NOTE — Patient Instructions (Addendum)
Great to see you today.   Please continue to take your Lantus 60 units once daily until gone.  Then REPLACE the lantus with TRESIBA    Take TRESIBA at the same dose of 60 units once daily in the morning.   Call me if you think you blood glucose numbers are NOT where they should be.  Next visit with Dr. Juanito Doom in March.

## 2017-08-28 NOTE — Progress Notes (Signed)
    S:     Chief Complaint  Patient presents with  . Medication Management    Diabetes    Patient arrives in good spirits, ambulating without assistance.  Presents for diabetes evaluation, education, and management and consideration for the CGM device. Patient was referred on 08/11/2017 by Primary Care Provider, Dr. Juanito Doom. Patient is here primarily for discussion of [place] Continuous Glucose Monitor system.  Patient was made aware of cost of CGM given her lack of multi-shot insulin regimen (insurance will not cover) and she decided to NOT proceed with CGM monitoring at this time.  Patient did make [place] aware that insurance coverage for Lantus has changed.  She is interested and willing to switch to a new insulin to replace the Lantus.   Patient reports adherence with medications. Uses pill box and bag to organize her medications. Rarely misses doses. Current diabetes medications include: Metformin 1000mg  BID, Lantus 60 units once daily in the AM, and Liraglutide 1.8mg  daily.   Current hypertension medications include: amlodipine 5mg , Lisinopril/HCTZ 20/12.5mg   Patient denies hypoglycemic events. She brings log book for review.   Patient reported dietary habits: - Erratic meal intake.  Breakfast:leftover chinese, likes hush puppies for breakfast Eats out ~3 times/month, tries to avoid canned foods  Patient reported exercise habits: none at present, limited by orthopedic surgeries. Wants to get back into the [employer] [facility], likes the silver sneakers classes.  Has had 2 UTIs in the last year, this was "a lot" for her. Denies yeast infections within the last year.  O:  Physical Exam  Constitutional: She appears well-developed and well-nourished.    Review of Systems  All other systems reviewed and are negative.   Lab Results  Component Value Date   HGBA1C 8.0 08/11/2017   Vitals:   08/28/17 0903 08/28/17 0908  BP: (!) 142/70 134/68  Pulse: 66   SpO2: 95%     Home  fasting CBG: 1802-220s, excursions to 145 QHS CBGs: 180s-230s, excursions to 109, 121, 264.  ASCVD risk score: 25.5% (using lipid panel from 01/2016, assuming total cholesterol of no less than 130 mg/dL)  A/P: Diabetes longstanding currently uncontrolled as evidenced by A1C worsened to 8%. Patient denies hypoglycemic events and is able to verbalize appropriate hypoglycemia management plan. Patient reports adherence with medication. Control is suboptimal due to sedentary lifestyle, dietary indiscretion, insulin resistance. No insurance coverage for CGM at this time, out of pocket cost for [employer] Professional Continuous Glucose Monitor determined to be unaffordable by patient.  -Change Lantus to [place] at 60 units daily (equivalent dose) for insurance preference and improved pharmacokinetic profile.  -Continue victoza 1.8 mg daily and metformin 1000 mg BID -Discussed CGM options with patient at length, patient to think about this for now -Counseled on increasing exercise and dietary changes that can be made to control BP  Next A1C anticipated March, 2018.    ASCVD risk greater than 7.5%. Continued Aspirin 81 mg and Continued rosuvastatin 20 mg.   Hypertension longstanding currently reasonably controlled.  Patient reports adherence with medication. Control suboptimal at times given dietary indiscretion and sedentary lifestyle.  Written patient instructions provided.  Total time in face to face counseling 25 minutes.   Follow up in with PCP in March, 2018.   Patient seen with Onnie Boer, PharmD Candidate and Deirdre Pippins, PGY2 Pharmacy Resident, PharmD, BCPS

## 2017-08-31 ENCOUNTER — Other Ambulatory Visit: Payer: Self-pay | Admitting: Family Medicine

## 2017-09-01 ENCOUNTER — Other Ambulatory Visit: Payer: Self-pay | Admitting: Family Medicine

## 2017-09-01 DIAGNOSIS — Z139 Encounter for screening, unspecified: Secondary | ICD-10-CM

## 2017-09-08 DIAGNOSIS — H40033 Anatomical narrow angle, bilateral: Secondary | ICD-10-CM | POA: Diagnosis not present

## 2017-09-08 DIAGNOSIS — H43813 Vitreous degeneration, bilateral: Secondary | ICD-10-CM | POA: Diagnosis not present

## 2017-09-08 DIAGNOSIS — Z961 Presence of intraocular lens: Secondary | ICD-10-CM | POA: Diagnosis not present

## 2017-09-08 DIAGNOSIS — H2513 Age-related nuclear cataract, bilateral: Secondary | ICD-10-CM | POA: Diagnosis not present

## 2017-09-11 ENCOUNTER — Telehealth: Payer: Self-pay | Admitting: *Deleted

## 2017-09-11 NOTE — Telephone Encounter (Signed)
Patient left message on nurse line requesting return call from PCP to discuss medications. Hubbard Hartshorn, RN, BSN

## 2017-09-12 ENCOUNTER — Other Ambulatory Visit: Payer: Self-pay | Admitting: Family Medicine

## 2017-09-12 DIAGNOSIS — E119 Type 2 diabetes mellitus without complications: Secondary | ICD-10-CM

## 2017-09-12 DIAGNOSIS — R69 Illness, unspecified: Secondary | ICD-10-CM | POA: Diagnosis not present

## 2017-09-12 MED ORDER — LISINOPRIL-HYDROCHLOROTHIAZIDE 20-12.5 MG PO TABS: 1.0000 | ORAL_TABLET | Freq: Every day | ORAL | 1 refills | Status: DC

## 2017-09-12 MED ORDER — MELATONIN 3 MG PO CAPS: 1.0000 | ORAL_CAPSULE | Freq: Every day | ORAL | 2 refills | Status: DC

## 2017-09-12 MED ORDER — METFORMIN HCL 500 MG PO TABS: 1000.0000 mg | ORAL_TABLET | Freq: Two times a day (BID) | ORAL | 3 refills | Status: DC

## 2017-09-12 MED ORDER — ASPIRIN EC 81 MG PO TBEC: 81.0000 mg | DELAYED_RELEASE_TABLET | Freq: Every day | ORAL | 1 refills | Status: DC

## 2017-09-12 MED ORDER — GLUCOSE BLOOD VI STRP: ORAL_STRIP | 12 refills | Status: DC

## 2017-09-12 MED ORDER — LIRAGLUTIDE 18 MG/3ML ~~LOC~~ SOPN: PEN_INJECTOR | SUBCUTANEOUS | 11 refills | Status: DC

## 2017-09-12 MED ORDER — ONETOUCH VERIO FLEX SYSTEM W/DEVICE KIT: 1.0000 "application " | PACK | Freq: Two times a day (BID) | 1 refills | Status: DC

## 2017-09-12 MED ORDER — AMLODIPINE BESYLATE 5 MG PO TABS: 5.0000 mg | ORAL_TABLET | Freq: Every day | ORAL | 3 refills | Status: DC

## 2017-09-12 MED ORDER — LEVOTHYROXINE SODIUM 88 MCG PO TABS: ORAL_TABLET | ORAL | 3 refills | Status: DC

## 2017-09-12 MED ORDER — INSULIN DEGLUDEC 100 UNIT/ML ~~LOC~~ SOPN: 60.0000 [IU] | PEN_INJECTOR | Freq: Every day | SUBCUTANEOUS | 11 refills | Status: DC

## 2017-09-12 MED ORDER — INSULIN GLARGINE 100 UNIT/ML SOLOSTAR PEN: PEN_INJECTOR | SUBCUTANEOUS | 1 refills | Status: DC

## 2017-09-12 MED ORDER — ROSUVASTATIN CALCIUM 20 MG PO TABS: 20.0000 mg | ORAL_TABLET | Freq: Every evening | ORAL | 3 refills | Status: DC

## 2017-09-12 MED ORDER — OMEPRAZOLE 40 MG PO CPDR: 40.0000 mg | DELAYED_RELEASE_CAPSULE | Freq: Two times a day (BID) | ORAL | 3 refills | Status: DC

## 2017-09-12 NOTE — Telephone Encounter (Signed)
Received message on nurse line from [place] requesting clarification: should patient be on both tresiba and lantus. Please call Gwenlyn Perking at [phone] to clarify. Hubbard Hartshorn, RN, BSN

## 2017-09-12 NOTE — Telephone Encounter (Signed)
Patient states that she's going to go with another pharmacy [facility]". She would like me to fax all her prescriptions there. She also needs a one touch meter with supplies. Did not see the [facility] pharmacy option in [place], asked her to ask them to send me a fax for her refills. In the meantime will get her a new meter and supplies.   Smitty Cords, MD [place], PGY-3

## 2017-09-15 ENCOUNTER — Encounter: Payer: Self-pay | Admitting: Family Medicine

## 2017-09-15 NOTE — Telephone Encounter (Signed)
Called the pharmacy, clarified that she should not be on Lantus and she should just be on Tresiba. They also requested verbal order for pen needles.   Smitty Cords, MD [place], PGY-3

## 2017-09-18 ENCOUNTER — Ambulatory Visit: Payer: Medicare HMO

## 2017-09-23 ENCOUNTER — Ambulatory Visit (INDEPENDENT_AMBULATORY_CARE_PROVIDER_SITE_OTHER): Payer: Medicare HMO | Admitting: Family Medicine

## 2017-09-23 ENCOUNTER — Other Ambulatory Visit: Payer: Self-pay

## 2017-09-23 DIAGNOSIS — R05 Cough: Secondary | ICD-10-CM

## 2017-09-23 DIAGNOSIS — R059 Cough, unspecified: Secondary | ICD-10-CM

## 2017-09-23 MED ORDER — FLUTICASONE PROPIONATE 50 MCG/ACT NA SUSP: 2.0000 | Freq: Every day | NASAL | 2 refills | Status: DC

## 2017-09-23 NOTE — Progress Notes (Signed)
   Subjective:    Patient ID: Kaitlyn Ferguson , female   DOB: 04-15-1946 , 72 y.o..   MRN: [id number]  HPI  Kaitlyn Ferguson is here for  Chief Complaint  Patient presents with  . Cough    1. COUGH  Onset: 2 months ago Course: Started as a runny nose/stuffy nose and has had intermittent cough since then.  Some days it will be worse and sometimes she will have any symptoms. Worse with: Nothing identified Better with: Nothing identified  Symptoms Sputum:no  Fever: no  Shortness of breath:no  Leg Swelling:no  Heart Burn or Reflux:no  Wheezing:no  Post Nasal Drip: yes   Red Flags Weight Loss:  no Immunocompromised:  no  PMH Asthma or COPD: no  PMH of Smoking: no  Using ACEIs: yes    Review of Systems: Per HPI.   Past Medical History: Patient Active Problem List   Diagnosis Date Noted  . Cough 09/23/2017  . Dysuria 04/09/2017  . S/P shoulder replacement, right 07/11/2016  . Primary osteoarthritis of left knee 12/21/2015  . History of ankle fracture 10/04/2015  . Dizziness 05/16/2015  . Cervical strain 02/28/2015  . Fatigue 08/13/2011  . Nausea without vomiting 06/20/2011  . Diabetes mellitus without complication ([place]) 46/27/0350  . Hyperlipidemia 03/18/2007  . Hypothyroidism 10/23/2006  . OBESITY, NOS 10/23/2006  . HYPERTENSION, BENIGN SYSTEMIC 10/23/2006  . ARTHRITIS, DEGENERATIVE 10/23/2006     Social Hx:  reports that  has never smoked. she has never used smokeless tobacco.   Objective:   BP 110/66   Pulse 68   Temp (!) 97.5 F (36.4 C)   Ht 5\' 7"  (1.702 m)   Wt 248 lb 6.4 oz (112.7 kg)   SpO2 93%   BMI 38.90 kg/m  Physical Exam  Gen: NAD, alert, cooperative with exam, well-appearing HEENT:     Head: Normocephalic, atraumatic    Neck: No masses palpated. No goiter. No lymphadenopathy     Ears: External ears normal, no drainage.Tympanic membranes intact, normal light reflex bilaterally, no erythema or bulging    Eyes: PERRLA, EOMI,  sclera white, normal conjunctiva    Nose: nasal turbinates moist, no nasal discharge, nasal mucosal edema    Throat: moist mucus membranes, no pharyngeal erythema, absent tonsils. Airway is patent Cardiac: Regular rate and rhythm, normal S1/S2, no murmur, no edema, capillary refill brisk  Respiratory: Clear to auscultation bilaterally, no wheezes, non-labored breathing Psych: good insight, normal mood and affect  Assessment & Plan:  Cough History and exam most consistent with postnasal drip or allergic rhinitis.  Other differentials include silent GERD versus being on ace inhibitors. -We will begin Flonase twice daily -We will hold off on oral allergy medications at this time -We will follow-up in 2 weeks if she is not noticing any improvement  Meds ordered this encounter  Medications  . fluticasone (FLONASE) 50 MCG/ACT nasal spray    Sig: Place 2 sprays into both nostrils daily.    Dispense:  16 g    Refill:  2    Smitty Cords, MD [place], PGY-3

## 2017-09-23 NOTE — Patient Instructions (Signed)
Thank you for coming in today, it was so nice to see you! Today we talked about:    Your symptoms sound most like postnasal drip, possibly from allergies.  You will need to use the Flonase nasal spray twice a day.  This should help clear her sinuses.  I would like to see you in 2 weeks to see if you have had any improvement with this.  Bring in all your medications or supplements to each appointment for review.   If we ordered any tests today, you will be notified via telephone of any abnormalities. If everything is normal you will get a letter in the mail.   If you have any questions or concerns, please do not hesitate to call the office at [phone]. You can also message me directly via MyChart.   Sincerely,  Smitty Cords, MD

## 2017-09-23 NOTE — Assessment & Plan Note (Addendum)
History and exam most consistent with postnasal drip or allergic rhinitis.  Other differentials include silent GERD versus being on ace inhibitors. -We will begin Flonase twice daily -We will hold off on oral allergy medications at this time -We will follow-up in 2 weeks if she is not noticing any improvement

## 2017-09-24 ENCOUNTER — Other Ambulatory Visit: Payer: Self-pay | Admitting: Family Medicine

## 2017-10-02 DIAGNOSIS — H353112 Nonexudative age-related macular degeneration, right eye, intermediate dry stage: Secondary | ICD-10-CM | POA: Diagnosis not present

## 2017-10-02 DIAGNOSIS — H353123 Nonexudative age-related macular degeneration, left eye, advanced atrophic without subfoveal involvement: Secondary | ICD-10-CM | POA: Diagnosis not present

## 2017-10-02 DIAGNOSIS — H35371 Puckering of macula, right eye: Secondary | ICD-10-CM | POA: Diagnosis not present

## 2017-10-02 DIAGNOSIS — H353221 Exudative age-related macular degeneration, left eye, with active choroidal neovascularization: Secondary | ICD-10-CM | POA: Diagnosis not present

## 2017-10-07 ENCOUNTER — Ambulatory Visit: Payer: Medicare HMO | Admitting: Family Medicine

## 2017-10-11 ENCOUNTER — Other Ambulatory Visit: Payer: Self-pay | Admitting: Family Medicine

## 2017-10-21 ENCOUNTER — Telehealth: Payer: Self-pay | Admitting: Family Medicine

## 2017-10-21 NOTE — Telephone Encounter (Signed)
[employer]  form dropped off for at front desk for completion.  Verified that patient section of form has been completed.  Last DOS with PCP was 09/23/17.  Placed form in  [facility]  team folder to be completed by clinical staff.  Carmina Miller

## 2017-10-21 NOTE — Telephone Encounter (Signed)
Clinical info completed on Healthcare Provider Verification form.  Place form in [place] box for completion.  Katharina Caper, Letia Guidry D, [place]

## 2017-10-23 DIAGNOSIS — Z794 Long term (current) use of insulin: Secondary | ICD-10-CM | POA: Diagnosis not present

## 2017-10-23 DIAGNOSIS — G8929 Other chronic pain: Secondary | ICD-10-CM | POA: Diagnosis not present

## 2017-10-23 DIAGNOSIS — I10 Essential (primary) hypertension: Secondary | ICD-10-CM | POA: Diagnosis not present

## 2017-10-23 DIAGNOSIS — K219 Gastro-esophageal reflux disease without esophagitis: Secondary | ICD-10-CM | POA: Diagnosis not present

## 2017-10-23 DIAGNOSIS — E039 Hypothyroidism, unspecified: Secondary | ICD-10-CM | POA: Diagnosis not present

## 2017-10-23 DIAGNOSIS — E785 Hyperlipidemia, unspecified: Secondary | ICD-10-CM | POA: Diagnosis not present

## 2017-10-23 DIAGNOSIS — K08409 Partial loss of teeth, unspecified cause, unspecified class: Secondary | ICD-10-CM | POA: Diagnosis not present

## 2017-10-23 DIAGNOSIS — E119 Type 2 diabetes mellitus without complications: Secondary | ICD-10-CM | POA: Diagnosis not present

## 2017-10-23 DIAGNOSIS — H409 Unspecified glaucoma: Secondary | ICD-10-CM | POA: Diagnosis not present

## 2017-10-23 NOTE — Telephone Encounter (Signed)
Form filled out and mailed for patient  Kaitlyn Cords, MD [place], PGY-3

## 2017-10-30 ENCOUNTER — Other Ambulatory Visit: Payer: Self-pay

## 2017-10-30 MED ORDER — LIRAGLUTIDE 18 MG/3ML ~~LOC~~ SOPN: PEN_INJECTOR | SUBCUTANEOUS | 11 refills | Status: DC

## 2017-10-30 NOTE — Telephone Encounter (Signed)
Louis Meckel with [employer] called back regarding forms she received. She states there are some inconsistencies with a few of the questions and she would like MD to return her call. She can be reached at [phone] ext. [place], RN

## 2017-10-30 NOTE — Telephone Encounter (Signed)
Pt called nurse line requesting refill on Victoza.  Pt call back [phone] Wallace Cullens, RN

## 2017-11-10 NOTE — Telephone Encounter (Signed)
Filled out form that the property management sent over.   Smitty Cords, MD [place], PGY-3

## 2017-11-12 ENCOUNTER — Ambulatory Visit (INDEPENDENT_AMBULATORY_CARE_PROVIDER_SITE_OTHER): Payer: Medicare HMO | Admitting: Family Medicine

## 2017-11-12 ENCOUNTER — Encounter: Payer: Self-pay | Admitting: Family Medicine

## 2017-11-12 ENCOUNTER — Other Ambulatory Visit: Payer: Self-pay

## 2017-11-12 VITALS — BP 128/66 | HR 73 | Temp 98.3°F | Ht 67.0 in | Wt 250.2 lb

## 2017-11-12 DIAGNOSIS — R0982 Postnasal drip: Secondary | ICD-10-CM | POA: Diagnosis not present

## 2017-11-12 DIAGNOSIS — E119 Type 2 diabetes mellitus without complications: Secondary | ICD-10-CM | POA: Diagnosis not present

## 2017-11-12 DIAGNOSIS — R69 Illness, unspecified: Secondary | ICD-10-CM | POA: Diagnosis not present

## 2017-11-12 MED ORDER — CETIRIZINE HCL 10 MG PO TABS
10.0000 mg | ORAL_TABLET | Freq: Every day | ORAL | 2 refills | Status: DC
Start: 2017-11-12 — End: 2018-02-09

## 2017-11-12 MED ORDER — ROSUVASTATIN CALCIUM 20 MG PO TABS: 20.0000 mg | ORAL_TABLET | Freq: Every evening | ORAL | 2 refills | Status: DC

## 2017-11-12 MED ORDER — FREESTYLE LANCETS MISC: 1.0000 | Freq: Two times a day (BID) | 99 refills | Status: DC

## 2017-11-12 NOTE — Progress Notes (Signed)
   Subjective:    Patient ID: Kaitlyn Ferguson , female   DOB: Jun 09, 1946 , 72 y.o..   MRN: [id number]  HPI  Kaitlyn Ferguson is here for  Chief Complaint  Patient presents with  . Cough    with chest and nasal congestion    1.  Cough and runny nose  Major symptoms: Cough and runny nose Has been sick for 7 days. Progression: Started with a runny nose however she has had chronic runny nose for the last couple months but then developed a cough and was feeling malaise Medications tried: None Sick contacts: None Patient believes may be caused by unsure  Symptoms Fever: No Headache or face pain: No Tooth pain: No Sneezing: Yes Scratchy throat: No Allergies: Denies any allergies although she has been placed on Flonase before this did help Muscle aches: No Severe fatigue: No Stiff neck: No Shortness of breath: No Rash: No Sore throat or swollen glands: No   ROS see HPI Smoking Status noted   Past Medical History: Patient Active Problem List   Diagnosis Date Noted  . Cough 09/23/2017  . Dysuria 04/09/2017  . S/P shoulder replacement, right 07/11/2016  . Primary osteoarthritis of left knee 12/21/2015  . History of ankle fracture 10/04/2015  . Dizziness 05/16/2015  . Cervical strain 02/28/2015  . Fatigue 08/13/2011  . Nausea without vomiting 06/20/2011  . Diabetes mellitus without complication ([place]) 09/38/1829  . Hyperlipidemia 03/18/2007  . Hypothyroidism 10/23/2006  . OBESITY, NOS 10/23/2006  . HYPERTENSION, BENIGN SYSTEMIC 10/23/2006  . ARTHRITIS, DEGENERATIVE 10/23/2006    Medications: reviewed   Social Hx:  reports that  has never smoked. she has never used smokeless tobacco.   Objective:   BP 128/66   Pulse 73   Temp 98.3 F (36.8 C) (Oral)   Ht 5\' 7"  (1.702 m)   Wt 250 lb 3.2 oz (113.5 kg)   SpO2 98%   BMI 39.19 kg/m  Physical Exam  Gen: NAD, alert, cooperative with exam, well-appearing HEENT:     Head: Normocephalic, atraumatic  Neck: No masses palpated. No goiter. No lymphadenopathy     Ears: External ears normal, no drainage.Tympanic membranes intact, normal light reflex bilaterally, no erythema or bulging    Eyes: PERRLA, EOMI, sclera white, normal conjunctiva    Nose: nasal turbinates moist, clear nasal discharge with congestion    Throat: moist mucus membranes, no pharyngeal erythema, no tonsillar exudate. Airway is patent Cardiac: Regular rate and rhythm, normal S1/S2, no murmur, no edema, capillary refill brisk  Respiratory: Clear to auscultation bilaterally, no wheezes, non-labored breathing Psych: good insight, normal mood and affect  Assessment & Plan:   Post-nasal drip: Cough likely secondary to postnasal drip after recovering from a URI.  She does have nasal congestion on exam but otherwise exam within normal limits.  Lungs are clear to auscultation no signs of pneumonia or bronchitis.  She also likely has chronic allergic rhinitis as her symptoms do improve with Flonase. -Continue Flonase -Begin Zyrtec daily - Return precautions discussed -Return in 1 month for diabetic visit  Smitty Cords, MD [place], PGY-3

## 2017-11-12 NOTE — Patient Instructions (Signed)
Thank you for coming in today, it was so nice to see you! Today we talked about:    Cough and congestion: You are likely recovering from a viral upper respiratory infection.  Continue to use the Flonase every day.  I am also starting you on an allergy medicine called Zyrtec.  Please take 1 pill a day  For nausea and indigestion: Please take over-the-counter Maalox.  You can also try Pepto-Bismol if this does not work.  Please follow up in 1 month for diabetes. You can schedule this appointment at the front desk before you leave or call the clinic.  If you have any questions or concerns, please do not hesitate to call the office at [phone]. You can also message me directly via MyChart.   Sincerely,  Smitty Cords, MD

## 2017-11-14 DIAGNOSIS — R69 Illness, unspecified: Secondary | ICD-10-CM | POA: Diagnosis not present

## 2017-11-25 DIAGNOSIS — H35371 Puckering of macula, right eye: Secondary | ICD-10-CM | POA: Diagnosis not present

## 2017-11-25 DIAGNOSIS — H40031 Anatomical narrow angle, right eye: Secondary | ICD-10-CM | POA: Diagnosis not present

## 2017-11-25 DIAGNOSIS — H353123 Nonexudative age-related macular degeneration, left eye, advanced atrophic without subfoveal involvement: Secondary | ICD-10-CM | POA: Diagnosis not present

## 2017-11-25 DIAGNOSIS — H353221 Exudative age-related macular degeneration, left eye, with active choroidal neovascularization: Secondary | ICD-10-CM | POA: Diagnosis not present

## 2017-11-25 DIAGNOSIS — H353112 Nonexudative age-related macular degeneration, right eye, intermediate dry stage: Secondary | ICD-10-CM | POA: Diagnosis not present

## 2017-12-02 ENCOUNTER — Ambulatory Visit: Payer: Medicare HMO

## 2017-12-17 ENCOUNTER — Ambulatory Visit: Payer: Medicare HMO | Admitting: Family Medicine

## 2017-12-23 ENCOUNTER — Other Ambulatory Visit: Payer: Self-pay

## 2017-12-23 ENCOUNTER — Encounter: Payer: Self-pay | Admitting: Family Medicine

## 2017-12-23 ENCOUNTER — Ambulatory Visit (INDEPENDENT_AMBULATORY_CARE_PROVIDER_SITE_OTHER): Payer: Medicare HMO | Admitting: Family Medicine

## 2017-12-23 VITALS — BP 118/78 | HR 74 | Temp 98.2°F | Ht 67.0 in | Wt 251.8 lb

## 2017-12-23 DIAGNOSIS — E119 Type 2 diabetes mellitus without complications: Secondary | ICD-10-CM

## 2017-12-23 LAB — POCT GLYCOSYLATED HEMOGLOBIN (HGB A1C): Hemoglobin A1C: 7.4

## 2017-12-23 NOTE — Assessment & Plan Note (Signed)
Hemoglobin A1c at goal today, A1c 7.4.  Goal A1c 7-8. - Continue current regimen of Tresiba, Victoza, metformin -Continue exercising with the Silver sneakers program - Continue checking glucoses every morning - Follow-up in 3 months for next A1c check -Patient will call her ophthalmologist for an eye exam

## 2017-12-23 NOTE — Progress Notes (Signed)
   Subjective:    Patient ID: Kaitlyn Ferguson , female   DOB: 14-Jun-1946 , 72 y.o..   MRN: [id number]  HPI  Kaitlyn Ferguson is a 72 year old female with past medical history of type 2 diabetes, hypothyroidism, hyperlipidemia, hypertension, obesity, arthritis, cough here for  Chief Complaint  Patient presents with  . Diabetes    1. Chronic Diabetes  Disease Monitoring  Blood Sugar Ranges: Mid 100's  Polyuria: no   Visual problems: yes, follows with  ophthalmology  Last hemoglobin A1C:  Lab Results  Component Value Date   HGBA1C 7.4 12/23/2017    Medication Compliance: yes, takes [place] 54 units at night, Victoza every day, metformin every day.  She is supposed to be on 60 units of Tresiba but she felt like that was too much so she took her cell down to 54 Medication Side Effects  Hypoglycemia: no   Preventitive Health Care  Eye Exam: Overdue for an eye exam, she will call her eye doctor for this  Foot Exam: Up-to-date  Diet pattern: Does snack frequently on sweets such as cakes, cookies, ice cream.  She knows that she does need to cut back on this  Exercise: Exercises with silver sneakers   Review of Systems: Per HPI. All other systems reviewed and are negative.  Past Medical History: Patient Active Problem List   Diagnosis Date Noted  . Cough 09/23/2017  . Dysuria 04/09/2017  . S/P shoulder replacement, right 07/11/2016  . Primary osteoarthritis of left knee 12/21/2015  . History of ankle fracture 10/04/2015  . Dizziness 05/16/2015  . Cervical strain 02/28/2015  . Fatigue 08/13/2011  . Nausea without vomiting 06/20/2011  . Diabetes mellitus without complication ([place]) 02/16/7627  . Hyperlipidemia 03/18/2007  . Hypothyroidism 10/23/2006  . OBESITY, NOS 10/23/2006  . HYPERTENSION, BENIGN SYSTEMIC 10/23/2006  . ARTHRITIS, DEGENERATIVE 10/23/2006    Medications: reviewed   Social Hx:  reports that she has never smoked. She has never used smokeless  tobacco.   Objective:   BP 118/78   Pulse 74   Temp 98.2 F (36.8 C) (Oral)   Ht 5\' 7"  (1.702 m)   Wt 251 lb 12.8 oz (114.2 kg)   SpO2 96%   BMI 39.44 kg/m  Physical Exam  Gen: NAD, alert, cooperative with exam, well-appearing Psych: good insight, normal mood and affect  Results for orders placed or performed in visit on 12/23/17  POCT glycosylated hemoglobin (Hb A1C)  Result Value Ref Range   Hemoglobin A1C 7.4      Assessment & Plan:  Diabetes mellitus without complication (HCC) Hemoglobin A1c at goal today, A1c 7.4.  Goal A1c 7-8. - Continue current regimen of Tresiba, Victoza, metformin -Continue exercising with the Silver sneakers program - Continue checking glucoses every morning - Follow-up in 3 months for next A1c check -Patient will call her ophthalmologist for an eye exam   Smitty Cords, MD [place], PGY-3

## 2017-12-29 DIAGNOSIS — R69 Illness, unspecified: Secondary | ICD-10-CM | POA: Diagnosis not present

## 2018-01-05 ENCOUNTER — Other Ambulatory Visit: Payer: Self-pay

## 2018-01-05 MED ORDER — LEVOTHYROXINE SODIUM 88 MCG PO TABS: ORAL_TABLET | ORAL | 3 refills | Status: DC

## 2018-01-07 ENCOUNTER — Other Ambulatory Visit: Payer: Self-pay

## 2018-01-07 MED ORDER — AMLODIPINE BESYLATE 5 MG PO TABS: 5.0000 mg | ORAL_TABLET | Freq: Every day | ORAL | 3 refills | Status: DC

## 2018-01-18 ENCOUNTER — Other Ambulatory Visit: Payer: Self-pay | Admitting: Family Medicine

## 2018-01-27 DIAGNOSIS — H3570 Unspecified separation of retinal layers: Secondary | ICD-10-CM | POA: Diagnosis not present

## 2018-01-27 DIAGNOSIS — H40031 Anatomical narrow angle, right eye: Secondary | ICD-10-CM | POA: Diagnosis not present

## 2018-01-27 DIAGNOSIS — H353112 Nonexudative age-related macular degeneration, right eye, intermediate dry stage: Secondary | ICD-10-CM | POA: Diagnosis not present

## 2018-01-27 DIAGNOSIS — E119 Type 2 diabetes mellitus without complications: Secondary | ICD-10-CM | POA: Diagnosis not present

## 2018-01-27 DIAGNOSIS — H353123 Nonexudative age-related macular degeneration, left eye, advanced atrophic without subfoveal involvement: Secondary | ICD-10-CM | POA: Diagnosis not present

## 2018-01-27 DIAGNOSIS — H353221 Exudative age-related macular degeneration, left eye, with active choroidal neovascularization: Secondary | ICD-10-CM | POA: Diagnosis not present

## 2018-02-06 DIAGNOSIS — R69 Illness, unspecified: Secondary | ICD-10-CM | POA: Diagnosis not present

## 2018-02-09 ENCOUNTER — Other Ambulatory Visit: Payer: Self-pay

## 2018-02-09 MED ORDER — CETIRIZINE HCL 10 MG PO TABS: 10.0000 mg | ORAL_TABLET | Freq: Every day | ORAL | 2 refills | Status: DC

## 2018-02-17 ENCOUNTER — Ambulatory Visit
Admission: RE | Admit: 2018-02-17 | Discharge: 2018-02-17 | Disposition: A | Discharge status: Home / Self Care | Payer: Medicare HMO | Source: Ambulatory Visit | Attending: Family Medicine | Admitting: Family Medicine

## 2018-02-17 DIAGNOSIS — Z1231 Encounter for screening mammogram for malignant neoplasm of breast: Secondary | ICD-10-CM | POA: Diagnosis not present

## 2018-02-17 DIAGNOSIS — Z139 Encounter for screening, unspecified: Secondary | ICD-10-CM

## 2018-02-23 ENCOUNTER — Other Ambulatory Visit: Payer: Self-pay | Admitting: Family Medicine

## 2018-02-24 NOTE — Telephone Encounter (Signed)
Will provide one refill. Please have patient come in and meet me, as we also need to recheck labs.

## 2018-02-24 NOTE — Telephone Encounter (Signed)
Contacted pt and scheduled her for 04/23/18 to meet PCP and obtain labs per Dr. Lindell Noe. Kaitlyn Ferguson, Kaitlyn Ferguson D, [place]

## 2018-03-11 ENCOUNTER — Other Ambulatory Visit: Payer: Self-pay | Admitting: Family Medicine

## 2018-03-16 DIAGNOSIS — Z961 Presence of intraocular lens: Secondary | ICD-10-CM | POA: Diagnosis not present

## 2018-03-16 DIAGNOSIS — H43813 Vitreous degeneration, bilateral: Secondary | ICD-10-CM | POA: Diagnosis not present

## 2018-03-16 DIAGNOSIS — H40033 Anatomical narrow angle, bilateral: Secondary | ICD-10-CM | POA: Diagnosis not present

## 2018-03-16 DIAGNOSIS — H2511 Age-related nuclear cataract, right eye: Secondary | ICD-10-CM | POA: Diagnosis not present

## 2018-03-26 DIAGNOSIS — R69 Illness, unspecified: Secondary | ICD-10-CM | POA: Diagnosis not present

## 2018-04-07 DIAGNOSIS — H353112 Nonexudative age-related macular degeneration, right eye, intermediate dry stage: Secondary | ICD-10-CM | POA: Diagnosis not present

## 2018-04-07 DIAGNOSIS — H35371 Puckering of macula, right eye: Secondary | ICD-10-CM | POA: Diagnosis not present

## 2018-04-07 DIAGNOSIS — H40031 Anatomical narrow angle, right eye: Secondary | ICD-10-CM | POA: Diagnosis not present

## 2018-04-07 DIAGNOSIS — H353123 Nonexudative age-related macular degeneration, left eye, advanced atrophic without subfoveal involvement: Secondary | ICD-10-CM | POA: Diagnosis not present

## 2018-04-07 DIAGNOSIS — H353221 Exudative age-related macular degeneration, left eye, with active choroidal neovascularization: Secondary | ICD-10-CM | POA: Diagnosis not present

## 2018-04-07 DIAGNOSIS — H3570 Unspecified separation of retinal layers: Secondary | ICD-10-CM | POA: Diagnosis not present

## 2018-04-12 DIAGNOSIS — H2511 Age-related nuclear cataract, right eye: Secondary | ICD-10-CM | POA: Diagnosis not present

## 2018-04-13 ENCOUNTER — Other Ambulatory Visit: Payer: Self-pay | Admitting: Family Medicine

## 2018-04-16 DIAGNOSIS — H2511 Age-related nuclear cataract, right eye: Secondary | ICD-10-CM | POA: Diagnosis not present

## 2018-04-20 ENCOUNTER — Telehealth: Payer: Self-pay

## 2018-04-20 NOTE — Telephone Encounter (Signed)
Pt called nurse line, states [facility] does not have refill for her insulin needles. Called [facility] to verify receipt of rx sent on 8/20. [facility] did receive and rx ready for pick up. Pt aware Wallace Cullens, RN

## 2018-04-23 ENCOUNTER — Encounter: Payer: Self-pay | Admitting: Family Medicine

## 2018-04-23 ENCOUNTER — Other Ambulatory Visit: Payer: Self-pay

## 2018-04-23 ENCOUNTER — Ambulatory Visit (INDEPENDENT_AMBULATORY_CARE_PROVIDER_SITE_OTHER): Payer: Medicare HMO | Admitting: Family Medicine

## 2018-04-23 VITALS — BP 136/72 | HR 69 | Temp 98.4°F | Ht 67.0 in | Wt 253.2 lb

## 2018-04-23 DIAGNOSIS — I10 Essential (primary) hypertension: Secondary | ICD-10-CM | POA: Diagnosis not present

## 2018-04-23 DIAGNOSIS — E785 Hyperlipidemia, unspecified: Secondary | ICD-10-CM

## 2018-04-23 DIAGNOSIS — E119 Type 2 diabetes mellitus without complications: Secondary | ICD-10-CM

## 2018-04-23 DIAGNOSIS — Z23 Encounter for immunization: Secondary | ICD-10-CM

## 2018-04-23 DIAGNOSIS — R110 Nausea: Secondary | ICD-10-CM

## 2018-04-23 LAB — POCT GLYCOSYLATED HEMOGLOBIN (HGB A1C): HbA1c, POC (controlled diabetic range): 7.7 % — AB (ref 0.0–7.0)

## 2018-04-23 MED ORDER — INSULIN DEGLUDEC 100 UNIT/ML ~~LOC~~ SOPN: 55.0000 [IU] | PEN_INJECTOR | Freq: Every day | SUBCUTANEOUS | 2 refills | Status: DC

## 2018-04-23 NOTE — Patient Instructions (Signed)
It was a pleasure to see you today! Thank you for choosing [facility] for your primary care. Kaitlyn Ferguson was seen for diabetes.   Our plans for today were:  Next time you go to the eye doctor, ask them to fax their notes over to [place] at [phone].   For your diabetes, go up to 55U [place] and call if you have any lows.   For your nausea, I sent a referral to the GI doctor, they will call you. Call us if you have any changes or get worried.   You should return to our clinic to see Dr. Lindell Noe in 3 months for diabetes.   Best,  Dr. Lindell Noe

## 2018-04-23 NOTE — Progress Notes (Signed)
   CC: DM, nausea  HPI  DM - wants to get off some of her meds. Using victoza and tresiba (52U per her report). Didn't bring meter today. CBG this am was 183 fasting. Only takes CBGs before breakfast and at night. 163 before breakfast yesterday. No lows that she knows of - lowest 99. Eating 3 meals per day and eats "junk."   "Stomach problems" - sometimes after she eats, she gets nauseated for "no reason." sometimes when she wakes up in the night she is nauseous. Lots of burping. BMs every 2-3 days, no blood. Used to take lots of pepto. Nothing help the nausea, used to take tums and this didn't really help. Doesn't think this is GERD, no heart burn, used to have this. Doesn't think reglan did anything. Never got to GI.   HTN - on amlodipine at home, no recent lightheadedness or low BPs.   ROS: Denies CP, SOB, abdominal pain, dysuria, changes in BMs.   CC, SH/smoking status, and VS noted  Objective: BP 136/72   Pulse 69   Temp 98.4 F (36.9 C) (Oral)   Ht 5\' 7"  (1.702 m)   Wt 253 lb 3.2 oz (114.9 kg)   SpO2 98%   BMI 39.66 kg/m  Gen: NAD, alert, cooperative, and pleasant. HEENT: NCAT, EOMI, PERRL CV: RRR, no murmur Resp: CTAB, no wheezes, non-labored Abd: SNTND, BS present, no guarding or organomegaly Ext: No edema, warm Neuro: Alert and oriented, Speech clear, No gross deficits  Assessment and plan:  HYPERTENSION, BENIGN SYSTEMIC Good control today, continue amlodipine and lisinopril-HCTZ, recheck BMP today.  Diabetes mellitus without complication ([place]) D3T up yo 7.7 from 7.4 at last check. Continue current regimen but increase tresiba to 55U daily from 52U. Call if hypoglycemic. Is already on statin and ACEi.   Nausea without vomiting Unclear etiology, patient states reglan did not help but she can't exactly recall. She certainly would be at risk for gastroparesis with years of poorly controlled DM. Referred back to GI. abd soft and nontender today.    Hyperlipidemia Recheck lipids today.   Orders Placed This Encounter  Procedures  . Flu Vaccine QUAD 36+ mos IM  . CBC  . Basic metabolic panel  . Lipid panel  . Ambulatory referral to Gastroenterology    Referral Priority:   Routine    Referral Type:   Consultation    Referral Reason:   Specialty Services Required    Number of Visits Requested:   1  . POCT glycosylated hemoglobin (Hb A1C)    Meds ordered this encounter  Medications  . insulin degludec (TRESIBA FLEXTOUCH) 100 UNIT/ML SOPN FlexTouch Pen    Sig: Inject 0.55 mLs (55 Units total) into the skin daily.    Dispense:  2 pen    Refill:  2    Please discontinue Lantus prescription - Tyler Aas replaces Lantus    Ralene Ok, MD, PGY3 04/28/2018 11:50 AM

## 2018-04-24 LAB — BASIC METABOLIC PANEL
BUN/Creatinine Ratio: 24 (ref 12–28)
BUN: 18 mg/dL (ref 8–27)
CO2: 28 mmol/L (ref 20–29)
Calcium: 9.6 mg/dL (ref 8.7–10.3)
Chloride: 102 mmol/L (ref 96–106)
Creatinine, Ser: 0.75 mg/dL (ref 0.57–1.00)
GFR calc Af Amer: 92 mL/min/{1.73_m2} (ref >59)
GFR calc non Af Amer: 80 mL/min/{1.73_m2} (ref >59)
Glucose: 208 mg/dL — ABNORMAL HIGH (ref 65–99)
Potassium: 3.8 mmol/L (ref 3.5–5.2)
Sodium: 144 mmol/L (ref 134–144)

## 2018-04-24 LAB — CBC
Hematocrit: 39.5 % (ref 34.0–46.6)
Hemoglobin: 12.5 g/dL (ref 11.1–15.9)
MCH: 25.5 pg — ABNORMAL LOW (ref 26.6–33.0)
MCHC: 31.6 g/dL (ref 31.5–35.7)
MCV: 80 fL (ref 79–97)
Platelets: 303 10*3/uL (ref 150–450)
RBC: 4.91 x10E6/uL (ref 3.77–5.28)
RDW: 14.7 % (ref 12.3–15.4)
WBC: 7.9 10*3/uL (ref 3.4–10.8)

## 2018-04-24 LAB — LIPID PANEL
Chol/HDL Ratio: 2.3 ratio (ref 0.0–4.4)
Cholesterol, Total: 88 mg/dL — ABNORMAL LOW (ref 100–199)
HDL: 38 mg/dL — ABNORMAL LOW (ref >39)
LDL Calculated: 20 mg/dL (ref 0–99)
Triglycerides: 148 mg/dL (ref 0–149)
VLDL Cholesterol Cal: 30 mg/dL (ref 5–40)

## 2018-04-28 NOTE — Assessment & Plan Note (Signed)
-   Recheck lipids today

## 2018-04-28 NOTE — Assessment & Plan Note (Signed)
Unclear etiology, patient states reglan did not help but she can't exactly recall. She certainly would be at risk for gastroparesis with years of poorly controlled DM. Referred back to GI. abd soft and nontender today.

## 2018-04-28 NOTE — Assessment & Plan Note (Signed)
a1c up yo 7.7 from 7.4 at last check. Continue current regimen but increase tresiba to 55U daily from 52U. Call if hypoglycemic. Is already on statin and ACEi.

## 2018-04-28 NOTE — Assessment & Plan Note (Addendum)
Good control today, continue amlodipine and lisinopril-HCTZ, recheck BMP today.

## 2018-04-29 ENCOUNTER — Telehealth: Payer: Self-pay

## 2018-04-29 NOTE — Telephone Encounter (Signed)
Pt informed. Lyrika Souders T Ravina Milner, CMA  

## 2018-04-29 NOTE — Telephone Encounter (Signed)
-----   Message from Sela Hilding, MD sent at 04/28/2018 11:51 AM EDT ----- Dema Severin team, could you let patient know her labs are normal, thanks.

## 2018-05-04 ENCOUNTER — Other Ambulatory Visit: Payer: Self-pay

## 2018-05-04 ENCOUNTER — Other Ambulatory Visit: Payer: Self-pay | Admitting: Family Medicine

## 2018-05-04 MED ORDER — METFORMIN HCL 500 MG PO TABS: ORAL_TABLET | ORAL | 1 refills | Status: DC

## 2018-05-17 DIAGNOSIS — R69 Illness, unspecified: Secondary | ICD-10-CM | POA: Diagnosis not present

## 2018-05-27 ENCOUNTER — Other Ambulatory Visit: Payer: Self-pay | Admitting: Family Medicine

## 2018-06-02 ENCOUNTER — Other Ambulatory Visit: Payer: Self-pay | Admitting: Family Medicine

## 2018-06-02 DIAGNOSIS — H43812 Vitreous degeneration, left eye: Secondary | ICD-10-CM | POA: Diagnosis not present

## 2018-06-02 DIAGNOSIS — H35371 Puckering of macula, right eye: Secondary | ICD-10-CM | POA: Diagnosis not present

## 2018-06-02 DIAGNOSIS — H353221 Exudative age-related macular degeneration, left eye, with active choroidal neovascularization: Secondary | ICD-10-CM | POA: Diagnosis not present

## 2018-06-02 DIAGNOSIS — H353112 Nonexudative age-related macular degeneration, right eye, intermediate dry stage: Secondary | ICD-10-CM | POA: Diagnosis not present

## 2018-06-02 DIAGNOSIS — H43811 Vitreous degeneration, right eye: Secondary | ICD-10-CM | POA: Diagnosis not present

## 2018-06-02 DIAGNOSIS — H353123 Nonexudative age-related macular degeneration, left eye, advanced atrophic without subfoveal involvement: Secondary | ICD-10-CM | POA: Diagnosis not present

## 2018-06-03 ENCOUNTER — Other Ambulatory Visit: Payer: Self-pay | Admitting: Family Medicine

## 2018-06-04 ENCOUNTER — Other Ambulatory Visit: Payer: Self-pay | Admitting: Family Medicine

## 2018-07-06 DIAGNOSIS — R69 Illness, unspecified: Secondary | ICD-10-CM | POA: Diagnosis not present

## 2018-07-15 ENCOUNTER — Other Ambulatory Visit: Payer: Self-pay | Admitting: Family Medicine

## 2018-07-21 DIAGNOSIS — H353221 Exudative age-related macular degeneration, left eye, with active choroidal neovascularization: Secondary | ICD-10-CM | POA: Diagnosis not present

## 2018-07-21 DIAGNOSIS — H43812 Vitreous degeneration, left eye: Secondary | ICD-10-CM | POA: Diagnosis not present

## 2018-07-21 DIAGNOSIS — H3562 Retinal hemorrhage, left eye: Secondary | ICD-10-CM | POA: Diagnosis not present

## 2018-08-03 ENCOUNTER — Other Ambulatory Visit: Payer: Self-pay

## 2018-08-04 MED ORDER — ROSUVASTATIN CALCIUM 20 MG PO TABS: 20.0000 mg | ORAL_TABLET | Freq: Every evening | ORAL | 2 refills | Status: DC

## 2018-08-07 ENCOUNTER — Other Ambulatory Visit: Payer: Self-pay | Admitting: Family Medicine

## 2018-08-07 DIAGNOSIS — E119 Type 2 diabetes mellitus without complications: Secondary | ICD-10-CM

## 2018-08-07 NOTE — Telephone Encounter (Signed)
Please call patient to come back in and have her diabetes checked in the next month or so. I will refill her triseba until the next appointment.

## 2018-08-14 DIAGNOSIS — Z961 Presence of intraocular lens: Secondary | ICD-10-CM | POA: Diagnosis not present

## 2018-08-14 DIAGNOSIS — H40033 Anatomical narrow angle, bilateral: Secondary | ICD-10-CM | POA: Diagnosis not present

## 2018-08-14 DIAGNOSIS — H43813 Vitreous degeneration, bilateral: Secondary | ICD-10-CM | POA: Diagnosis not present

## 2018-08-15 ENCOUNTER — Other Ambulatory Visit: Payer: Self-pay | Admitting: Family Medicine

## 2018-08-15 DIAGNOSIS — E119 Type 2 diabetes mellitus without complications: Secondary | ICD-10-CM

## 2018-08-17 ENCOUNTER — Other Ambulatory Visit: Payer: Self-pay | Admitting: Family Medicine

## 2018-08-17 DIAGNOSIS — E119 Type 2 diabetes mellitus without complications: Secondary | ICD-10-CM

## 2018-08-18 NOTE — Telephone Encounter (Signed)
Will refill tresiba, but patient overdue for appt with me. Please call and have patient seen as soon as they can for diabetes follow up.

## 2018-08-20 NOTE — Telephone Encounter (Signed)
Contacted pt and appointment scheduled. Zimmerman Rumple, Yocheved Depner D, CMA  

## 2018-08-24 ENCOUNTER — Other Ambulatory Visit: Payer: Self-pay

## 2018-08-27 ENCOUNTER — Other Ambulatory Visit: Payer: Self-pay | Admitting: Family Medicine

## 2018-08-27 MED ORDER — LISINOPRIL-HYDROCHLOROTHIAZIDE 20-12.5 MG PO TABS: 1.0000 | ORAL_TABLET | Freq: Every day | ORAL | 0 refills | Status: DC

## 2018-09-01 DIAGNOSIS — H401122 Primary open-angle glaucoma, left eye, moderate stage: Secondary | ICD-10-CM | POA: Diagnosis not present

## 2018-09-01 DIAGNOSIS — H43813 Vitreous degeneration, bilateral: Secondary | ICD-10-CM | POA: Diagnosis not present

## 2018-09-01 DIAGNOSIS — H40033 Anatomical narrow angle, bilateral: Secondary | ICD-10-CM | POA: Diagnosis not present

## 2018-09-01 DIAGNOSIS — Z961 Presence of intraocular lens: Secondary | ICD-10-CM | POA: Diagnosis not present

## 2018-09-04 ENCOUNTER — Other Ambulatory Visit: Payer: Self-pay | Admitting: Family Medicine

## 2018-09-04 DIAGNOSIS — E119 Type 2 diabetes mellitus without complications: Secondary | ICD-10-CM

## 2018-09-07 ENCOUNTER — Other Ambulatory Visit: Payer: Self-pay

## 2018-09-07 DIAGNOSIS — E119 Type 2 diabetes mellitus without complications: Secondary | ICD-10-CM

## 2018-09-07 MED ORDER — INSULIN DEGLUDEC 100 UNIT/ML ~~LOC~~ SOPN: PEN_INJECTOR | SUBCUTANEOUS | 11 refills | Status: DC

## 2018-09-07 NOTE — Telephone Encounter (Signed)
Patient called asking that more than 2 pens of Tresiba be sent at a time. This only lasts her 11 days and she has to pay a copay for this 3 times monthly.   Stated previous PCP would send 10 pens and she only paid one co-pay a month.  Call back is [phone]  Danley Danker, RN [facility])

## 2018-09-08 DIAGNOSIS — H353112 Nonexudative age-related macular degeneration, right eye, intermediate dry stage: Secondary | ICD-10-CM | POA: Diagnosis not present

## 2018-09-08 DIAGNOSIS — H35371 Puckering of macula, right eye: Secondary | ICD-10-CM | POA: Diagnosis not present

## 2018-09-08 DIAGNOSIS — H353221 Exudative age-related macular degeneration, left eye, with active choroidal neovascularization: Secondary | ICD-10-CM | POA: Diagnosis not present

## 2018-09-08 DIAGNOSIS — H353123 Nonexudative age-related macular degeneration, left eye, advanced atrophic without subfoveal involvement: Secondary | ICD-10-CM | POA: Diagnosis not present

## 2018-09-16 ENCOUNTER — Encounter: Payer: Self-pay | Admitting: Family Medicine

## 2018-09-16 ENCOUNTER — Other Ambulatory Visit: Payer: Self-pay

## 2018-09-16 ENCOUNTER — Ambulatory Visit (INDEPENDENT_AMBULATORY_CARE_PROVIDER_SITE_OTHER): Payer: Medicare Other | Admitting: Family Medicine

## 2018-09-16 VITALS — BP 140/66 | HR 69 | Temp 97.7°F | Ht 67.0 in | Wt 253.4 lb

## 2018-09-16 DIAGNOSIS — I10 Essential (primary) hypertension: Secondary | ICD-10-CM | POA: Diagnosis not present

## 2018-09-16 DIAGNOSIS — E119 Type 2 diabetes mellitus without complications: Secondary | ICD-10-CM

## 2018-09-16 LAB — POCT GLYCOSYLATED HEMOGLOBIN (HGB A1C): HbA1c, POC (controlled diabetic range): 8.1 % — AB (ref 0.0–7.0)

## 2018-09-16 MED ORDER — INSULIN DEGLUDEC 100 UNIT/ML ~~LOC~~ SOPN: PEN_INJECTOR | SUBCUTANEOUS | 11 refills | Status: DC

## 2018-09-16 NOTE — Assessment & Plan Note (Signed)
A1c slightly elevated today, states she has had some continued dietary indiscretions and has been relatively sedentary.  Denies ever running out of insulin.  We discussed increasing her Tyler Aas as well as possibly adding a dinner mealtime coverage.  She is hesitant about using NovoLog given previous accidental overdose.  She elects to increase her Tresiba 60 units and significantly increase her activity.  In 3 months if no significant improvement in A1c will agree to add dinnertime short acting.

## 2018-09-16 NOTE — Progress Notes (Signed)
   CC: DM, HTN  HPI  DM - Thinks her sugar has been "off the wall." feels she has put on some weight. Has not changed her diet. No recent steroids. Never missing insulin. CBG in the am is close to 200. CBG before dinner last night was about 227. Largest meal of the day is breakfast. She over dosed on novolog by accident - she was supposed to take 5 units and took 98 by accident. Not exercising. Can't afford healthy food. Would be interested in the food box.   HTN - taking norvasc, lisinopril HCTZ. She lives in an ALF and someone there checks her BP weekly - been 35s / 62s.   Hx of glaucoma in the L eye, planning surgery 2/27.   ROS: Denies CP, SOB, abdominal pain, dysuria, changes in BMs.   CC, SH/smoking status, and VS noted  Objective: BP 140/66   Pulse 69   Temp 97.7 F (36.5 C) (Oral)   Ht 5\' 7"  (1.702 m)   Wt 253 lb 6 oz (114.9 kg)   SpO2 96%   BMI 39.68 kg/m  Gen: NAD, alert, cooperative, and pleasant. HEENT: NCAT, EOMI, PERRL CV: RRR, no murmur Resp: CTAB, no wheezes, non-labored Ext: No edema, warm Neuro: Alert and oriented, Speech clear, No gross deficits  Assessment and plan:  HYPERTENSION, BENIGN SYSTEMIC Blood pressure is apparently well controlled at home, mildly elevated today.  Patient has agreed to work on her exercise regimen, if blood pressure is persistently elevated at next visit or elevated at home we will consider adjusting her Norvasc dose.  Diabetes mellitus without complication (HCC) U2P slightly elevated today, states she has had some continued dietary indiscretions and has been relatively sedentary.  Denies ever running out of insulin.  We discussed increasing her Tyler Aas as well as possibly adding a dinner mealtime coverage.  She is hesitant about using NovoLog given previous accidental overdose.  She elects to increase her Tresiba 60 units and significantly increase her activity.  In 3 months if no significant improvement in A1c will agree to add  dinnertime short acting.   Orders Placed This Encounter  Procedures  . HgB A1c    Meds ordered this encounter  Medications  . insulin degludec (TRESIBA FLEXTOUCH) 100 UNIT/ML SOPN FlexTouch Pen    Sig: INJECT 60 UNITS UNDER THE SKIN EVERY DAY    Dispense:  10 pen    Refill:  11    Ralene Ok, MD, PGY3 09/16/2018 12:14 PM

## 2018-09-16 NOTE — Patient Instructions (Addendum)
It was a pleasure to see you today! Thank you for choosing [facility] for your primary care. Kaitlyn Ferguson was seen for diabetes.   Our plans for today were:  Please walk 15 minutes 3 times per week with your friend. Increase this as you are able.   Think about whether you would be willing to add another mealtime shot at dinner.   Increase your tresiba to 60 units.    Best,  Dr. Lindell Noe

## 2018-09-16 NOTE — Assessment & Plan Note (Signed)
Blood pressure is apparently well controlled at home, mildly elevated today.  Patient has agreed to work on her exercise regimen, if blood pressure is persistently elevated at next visit or elevated at home we will consider adjusting her Norvasc dose.

## 2018-10-06 ENCOUNTER — Ambulatory Visit: Payer: Medicare Other | Admitting: Family Medicine

## 2018-10-12 ENCOUNTER — Other Ambulatory Visit: Payer: Self-pay

## 2018-10-12 MED ORDER — LIRAGLUTIDE 18 MG/3ML ~~LOC~~ SOPN: PEN_INJECTOR | SUBCUTANEOUS | 11 refills | Status: DC

## 2018-10-13 ENCOUNTER — Other Ambulatory Visit: Payer: Self-pay

## 2018-10-13 MED ORDER — GLUCOSE BLOOD VI STRP: ORAL_STRIP | 12 refills | Status: DC

## 2018-10-15 ENCOUNTER — Other Ambulatory Visit: Payer: Self-pay

## 2018-10-15 MED ORDER — INSULIN PEN NEEDLE 31G X 5 MM MISC: 0 refills | Status: DC

## 2018-10-22 DIAGNOSIS — H401122 Primary open-angle glaucoma, left eye, moderate stage: Secondary | ICD-10-CM | POA: Diagnosis not present

## 2018-10-31 ENCOUNTER — Other Ambulatory Visit: Payer: Self-pay | Admitting: Family Medicine

## 2018-11-10 DIAGNOSIS — H353112 Nonexudative age-related macular degeneration, right eye, intermediate dry stage: Secondary | ICD-10-CM | POA: Diagnosis not present

## 2018-11-10 DIAGNOSIS — H35371 Puckering of macula, right eye: Secondary | ICD-10-CM | POA: Diagnosis not present

## 2018-11-10 DIAGNOSIS — H353123 Nonexudative age-related macular degeneration, left eye, advanced atrophic without subfoveal involvement: Secondary | ICD-10-CM | POA: Diagnosis not present

## 2018-11-10 DIAGNOSIS — H353221 Exudative age-related macular degeneration, left eye, with active choroidal neovascularization: Secondary | ICD-10-CM | POA: Diagnosis not present

## 2018-11-19 ENCOUNTER — Telehealth (INDEPENDENT_AMBULATORY_CARE_PROVIDER_SITE_OTHER): Payer: Medicare Other | Admitting: *Deleted

## 2018-11-19 DIAGNOSIS — M545 Low back pain, unspecified: Secondary | ICD-10-CM

## 2018-11-19 NOTE — Telephone Encounter (Signed)
[place] Telemedicine Visit  Patient consented to have visit conducted via telephone.  Encounter participants: Patient: Kaitlyn Ferguson  Provider: Ralene Ok  Others (if applicable): none   Chief Complaint: back pain   HPI:  Called patient as she has been having some hip pain. L back and side pain started yesterday and today. She has trouble getting up and down from chairs today. She took 4 APAP yesterday total. These did not seem to help it, maybe eased it off. A friend got her [employer] brand aleeve and she took two of those. That seemed to help. This seemed to flare up about a month ago. She has had a disc removed in her lower back 30 years ago. Has tried heating pad x 3-4 hours. She was worried about a kidney infection, but she has not had increased frequency or urgency. Doesn't feel like the kidney infection that she had previously. Never had a kidney stone, but says it is a 7-8/10 and she is not colicky or writhing. No fever or chills. Urine is the normal color. She thinks she could have strained her back lifting groceries or something, but not sure. No numbess or weakness of her legs.   ROS: Denies CP, SOB, abdominal pain, dysuria, changes in BMs.   Pertinent PMHx: DMII, HTN, HLD, bilateral knee OA  Assessment/Plan:  Back pain: no red flags on hx, suspect muscular strain or OA flare. Given normal Cr, treat with aleeve BID through the weekend, and call Monday if no improvement. Can use APAP with the aleeve if needed. If no improvement, could also trial prednisone vs office visit in person.    Time spent on phone with patient: 8 minutes Billed according to telehealth guidelines.  Ralene Ok, MD

## 2018-11-19 NOTE — Telephone Encounter (Signed)
Patient left message on VM that she is having hip pain and would like to talk to the PCP about this.  Kaitlyn Ferguson, CMA

## 2018-11-22 ENCOUNTER — Other Ambulatory Visit: Payer: Self-pay | Admitting: Student in an Organized Health Care Education/Training Program

## 2018-12-04 ENCOUNTER — Other Ambulatory Visit: Payer: Self-pay | Admitting: Family Medicine

## 2018-12-04 DIAGNOSIS — E119 Type 2 diabetes mellitus without complications: Secondary | ICD-10-CM

## 2018-12-07 ENCOUNTER — Other Ambulatory Visit: Payer: Self-pay | Admitting: *Deleted

## 2018-12-07 MED ORDER — INSULIN PEN NEEDLE 31G X 5 MM MISC: 12 refills | Status: DC

## 2018-12-24 ENCOUNTER — Other Ambulatory Visit: Payer: Self-pay

## 2018-12-24 DIAGNOSIS — E119 Type 2 diabetes mellitus without complications: Secondary | ICD-10-CM

## 2018-12-25 ENCOUNTER — Other Ambulatory Visit (INDEPENDENT_AMBULATORY_CARE_PROVIDER_SITE_OTHER): Payer: Medicare Other

## 2018-12-25 ENCOUNTER — Other Ambulatory Visit: Payer: Self-pay

## 2018-12-25 DIAGNOSIS — E119 Type 2 diabetes mellitus without complications: Secondary | ICD-10-CM

## 2018-12-25 LAB — POCT GLYCOSYLATED HEMOGLOBIN (HGB A1C): HbA1c, POC (controlled diabetic range): 7.8 % — AB (ref 0.0–7.0)

## 2018-12-25 MED ORDER — FREESTYLE LANCETS MISC: 1.0000 | Freq: Two times a day (BID) | 99 refills | Status: DC

## 2018-12-31 ENCOUNTER — Telehealth (INDEPENDENT_AMBULATORY_CARE_PROVIDER_SITE_OTHER): Payer: Medicare Other | Admitting: Family Medicine

## 2018-12-31 ENCOUNTER — Encounter: Payer: Self-pay | Admitting: Family Medicine

## 2018-12-31 ENCOUNTER — Telehealth: Payer: Self-pay | Admitting: *Deleted

## 2018-12-31 ENCOUNTER — Other Ambulatory Visit: Payer: Self-pay

## 2018-12-31 VITALS — Wt 251.0 lb

## 2018-12-31 DIAGNOSIS — Z20822 Contact with and (suspected) exposure to covid-19: Secondary | ICD-10-CM

## 2018-12-31 DIAGNOSIS — Z20828 Contact with and (suspected) exposure to other viral communicable diseases: Secondary | ICD-10-CM | POA: Diagnosis not present

## 2018-12-31 NOTE — Telephone Encounter (Signed)
-----   Message from Sela Hilding, MD sent at 12/31/2018 10:30 AM EDT ----- Dema Severin team, can we add her to my schedule tomorrow or next week to discuss DM via virtual visit?

## 2018-12-31 NOTE — Progress Notes (Signed)
[place] Telemedicine Visit  Patient consented to have visit conducted via telephone.  Encounter participants: Patient: Kaitlyn Ferguson  Provider: Wilber Oliphant, MD  PCP: Sela Hilding, MD Subjective:  CC: Secondary exposure to COVID-19  HPI:  [name] is a 73 y.o. female with past medical history significant for diabetes, hypertension, hyper lipidemia, hypothyroidism who calls with concern about secondary exposure to someone with COVID.  Patient reports that her son continues to work and that someone at his work was tested positive for COVID-19.  Patient reports that son was in the same vicinity of positive patient on Friday, May 1 and then saw her son on Tuesday, may 5 when he came over to her house.  She notes that her son continues to go to work but does have to wear personal protective equipment such as masks.    Patient denies any recent fever, cough, SOB, chills, myalgias, sore throat, headache, anosmia, n/v, diarrhea, direct contact COVID positive individual in the last two weeks.    Medications & Allergies: Reviewed in chart Pertinent Medical, Family and Social History: Diabetes  Objective:  Pertinent Labs & Imaging:  Reviewed in chart   Assessment & Plan:  Exposure to Covid-19 Virus Patient has had only secondary contact with atopic positive individual.  The secondary contact was on Tuesday, May 5, and patient has not experienced any symptoms.  She requests testing, however, no testing for asymptomatic individuals is available at this time  Self quarantine x2 weeks from date of last exposure.  If patient develops any symptoms, should call the clinic for a telemedicine visit.  If patient has any sort of respiratory distress, should go straight to the emergency department or call 911.  Diagnoses and all orders for this visit:  Exposure to Covid-19 Virus   Time spent on phone with patient: 10 minutes Charge:  @CHARGES @  Zettie Cooley, M.D. [place]  PGY -1 01/02/2019, 3:53 PM

## 2018-12-31 NOTE — Telephone Encounter (Signed)
Contacted pt and scheduled her an appointment per Dr. Lindell Noe, also collected her information for her visit today with Dr. Maudie Mercury. Kaitlyn Ferguson, CMA

## 2019-01-01 ENCOUNTER — Other Ambulatory Visit: Payer: Self-pay

## 2019-01-01 ENCOUNTER — Encounter: Payer: Self-pay | Admitting: Family Medicine

## 2019-01-01 ENCOUNTER — Telehealth (INDEPENDENT_AMBULATORY_CARE_PROVIDER_SITE_OTHER): Payer: Medicare Other | Admitting: Family Medicine

## 2019-01-01 DIAGNOSIS — E038 Other specified hypothyroidism: Secondary | ICD-10-CM | POA: Diagnosis not present

## 2019-01-01 DIAGNOSIS — E119 Type 2 diabetes mellitus without complications: Secondary | ICD-10-CM

## 2019-01-01 DIAGNOSIS — I10 Essential (primary) hypertension: Secondary | ICD-10-CM

## 2019-01-01 NOTE — Assessment & Plan Note (Addendum)
Continue current management, continue lifestyle modifications and avoiding off plan foods. Needs BMP next visit. No lows. On ASA, statin. Needs PNA vax and foot exam next time.

## 2019-01-01 NOTE — Progress Notes (Signed)
[place] Telemedicine Visit  Patient consented to have virtual visit. Method of visit: Telephone  Encounter participants: Patient: Kaitlyn Ferguson - located at home Provider: Ralene Ok - located at [facility] Others (if applicable): none  Chief Complaint: dm follow up   HPI:  Pt came in for lab visit on 5/1 to recheck her a1c. It was improved from previous. She takes metformin 1000mg  BID, 1.8 victoza, and 60 units of tresiba. She takes both of her injections in the am. She "eats stuff I shouldn't and when I do eat it I eat too much of it." she has started walking again w a friend. No low episodes. Taking statin + ASA. Her eye doctor saw 10/22/2018 Dr. Katy Fitch. She had a stent put in her eye for pressure.   HTN - she says her BP is 488Q systolic this am. She took her ACE about 90 min before this.   She is taking levothyroxine. Dose is 58mcg. We will plan to recheck TSH next visit.   Her son was in the breakroom with a colleague and that colleague tested positive for COVID. She saw her son 5/5 am. Her son is asymptomatic. She is asymptomatic. Her son is still working and checking his temperature. She lives in apartments where a neighbor had [place] and she was around the Financial risk analyst who was around that neighbor. She was told to self quarantine yesterday.   ROS: per HPI  Pertinent PMHx: HLD, T2DM, hypothyroidism   Exam:  Respiratory: easily speaks in long sentences.   Assessment/Plan:  Hypothyroidism Needs TSH recheck at next in person visit.   HYPERTENSION, BENIGN SYSTEMIC Above goal today, pt will keep checking and call me if it is persistently elevated. Not consistently this high recently. Needs BMP next in person visit.   Diabetes mellitus without complication ([place]) Continue current management, continue lifestyle modifications and avoiding off plan foods. Needs BMP next visit. No lows. On ASA, statin. Needs PNA vax and foot exam next time.      Time spent during visit with patient: 15 minutes  Ralene Ok, MD

## 2019-01-01 NOTE — Assessment & Plan Note (Signed)
Above goal today, pt will keep checking and call me if it is persistently elevated. Not consistently this high recently. Needs BMP next in person visit.

## 2019-01-01 NOTE — Assessment & Plan Note (Signed)
Needs TSH recheck at next in person visit.

## 2019-01-02 DIAGNOSIS — Z20828 Contact with and (suspected) exposure to other viral communicable diseases: Secondary | ICD-10-CM | POA: Insufficient documentation

## 2019-01-02 DIAGNOSIS — Z20822 Contact with and (suspected) exposure to covid-19: Secondary | ICD-10-CM | POA: Insufficient documentation

## 2019-01-02 NOTE — Assessment & Plan Note (Signed)
Patient has had only secondary contact with atopic positive individual.  The secondary contact was on Tuesday, May 5, and patient has not experienced any symptoms.  She requests testing, however, no testing for asymptomatic individuals is available at this time  Self quarantine x2 weeks from date of last exposure.  If patient develops any symptoms, should call the clinic for a telemedicine visit.  If patient has any sort of respiratory distress, should go straight to the emergency department or call 911.

## 2019-01-12 DIAGNOSIS — H4052X4 Glaucoma secondary to other eye disorders, left eye, indeterminate stage: Secondary | ICD-10-CM | POA: Diagnosis not present

## 2019-01-12 DIAGNOSIS — H35371 Puckering of macula, right eye: Secondary | ICD-10-CM | POA: Diagnosis not present

## 2019-01-12 DIAGNOSIS — H353221 Exudative age-related macular degeneration, left eye, with active choroidal neovascularization: Secondary | ICD-10-CM | POA: Diagnosis not present

## 2019-01-12 DIAGNOSIS — H43811 Vitreous degeneration, right eye: Secondary | ICD-10-CM | POA: Diagnosis not present

## 2019-01-12 DIAGNOSIS — E119 Type 2 diabetes mellitus without complications: Secondary | ICD-10-CM | POA: Diagnosis not present

## 2019-01-19 ENCOUNTER — Ambulatory Visit (INDEPENDENT_AMBULATORY_CARE_PROVIDER_SITE_OTHER): Payer: Medicare Other | Admitting: Family Medicine

## 2019-01-19 ENCOUNTER — Other Ambulatory Visit: Payer: Self-pay

## 2019-01-19 ENCOUNTER — Encounter: Payer: Self-pay | Admitting: Family Medicine

## 2019-01-19 VITALS — BP 142/80 | HR 77

## 2019-01-19 DIAGNOSIS — R300 Dysuria: Secondary | ICD-10-CM

## 2019-01-19 DIAGNOSIS — I10 Essential (primary) hypertension: Secondary | ICD-10-CM | POA: Diagnosis not present

## 2019-01-19 LAB — POCT URINALYSIS DIP (MANUAL ENTRY)
Bilirubin, UA: NEGATIVE
Glucose, UA: NEGATIVE mg/dL
Ketones, POC UA: NEGATIVE mg/dL
Nitrite, UA: NEGATIVE
Protein Ur, POC: 100 mg/dL — AB
Spec Grav, UA: 1.02 (ref 1.010–1.025)
Urobilinogen, UA: 0.2 E.U./dL
pH, UA: 6.5 (ref 5.0–8.0)

## 2019-01-19 MED ORDER — PHENAZOPYRIDINE HCL 200 MG PO TABS: 200.0000 mg | ORAL_TABLET | Freq: Three times a day (TID) | ORAL | 0 refills | Status: DC | PRN

## 2019-01-19 MED ORDER — CEPHALEXIN 500 MG PO CAPS: 500.0000 mg | ORAL_CAPSULE | Freq: Four times a day (QID) | ORAL | 0 refills | Status: AC

## 2019-01-19 NOTE — Progress Notes (Signed)
   Subjective   Patient ID: Kaitlyn Ferguson    DOB: [birth year], 73 y.o. female   MRN: [id number]  CC: "I think I have a urinary tract infection"  HPI: Kaitlyn Ferguson is a 73 y.o. female who presents to clinic today for the following:  DYSURIA  Pain urinating started 1 days ago. Pain is: burning Medications tried: Tylenol Any antibiotics in the last 30 days: no More than 3 UTIs in the last 12 months: no STD exposure: no Possibly pregnant: no  Symptoms Urgency: yes Frequency: yes Blood in urine: yes Pain in back: no Fever: no Vaginal discharge: no Mouth Ulcers: no  ROS: see HPI for pertinent.  [place]: Reviewed. Smoking status reviewed. Medications reviewed.  Objective   BP (!) 142/80   Pulse 77   SpO2 97%  Vitals and nursing note reviewed.  General: well nourished, well developed, NAD with non-toxic appearance HEENT: normocephalic, atraumatic, moist mucous membranes Lungs: normal work of breathing Abdomen: soft, non-tender, non-distended, normoactive bowel sounds GU: absent suprapubic tenderness, absent costovertebral tenderness Skin: warm, dry, no rashes or lesions Extremities: warm and well perfused, normal tone, no edema  Assessment & Plan   Dysuria Clinically consistent with UTI.  Low concern for pyelonephritis.  No positive growth on prior cultures. - Initiating Keflex 500 mg 4 times daily for 7 days and given Pyridium 200 mg as needed - Will culture - Reviewed return precautions, RTC as needed  Primary hypertension Slightly elevated today in office.  Likely due to active UTI. - Continue current regimen  Orders Placed This Encounter  Procedures  . Urine Culture  . POCT urinalysis dipstick   Meds ordered this encounter  Medications  . cephALEXin (KEFLEX) 500 MG capsule    Sig: Take 1 capsule (500 mg total) by mouth 4 (four) times daily for 7 days.    Dispense:  28 capsule    Refill:  0  . phenazopyridine (PYRIDIUM) 200 MG tablet    Sig:  Take 1 tablet (200 mg total) by mouth 3 (three) times daily as needed for pain.    Dispense:  10 tablet    Refill:  0    Harriet Butte, [place], PGY-3 01/19/2019, 4:00 PM

## 2019-01-19 NOTE — Assessment & Plan Note (Signed)
Slightly elevated today in office.  Likely due to active UTI. - Continue current regimen

## 2019-01-19 NOTE — Patient Instructions (Signed)
Thank you for coming in to see [place] today. Please see below to review our plan for today's visit.  Your symptoms do sound consistent with a urinary tract infection.  Take the Keflex 4 times daily for 7 days.  Your symptoms should improve within 48 hours.  If you develop high fevers, vomiting, or new onset back pain, please let us know.  I also gave you a prescription for Pyridium which will help coat the bladder and give you some relief from the burning sensation.  I will call you if your urine culture grows bacteria which is resistant to the antibiotic, although this is rare.  Please call the clinic at [phone] if your symptoms worsen or you have any concerns. It was our pleasure to serve you.  Harriet Butte, [place], PGY-3

## 2019-01-19 NOTE — Assessment & Plan Note (Signed)
Clinically consistent with UTI.  Low concern for pyelonephritis.  No positive growth on prior cultures. - Initiating Keflex 500 mg 4 times daily for 7 days and given Pyridium 200 mg as needed - Will culture - Reviewed return precautions, RTC as needed

## 2019-01-21 LAB — URINE CULTURE

## 2019-01-25 ENCOUNTER — Other Ambulatory Visit: Payer: Self-pay | Admitting: Family Medicine

## 2019-01-25 DIAGNOSIS — Z1231 Encounter for screening mammogram for malignant neoplasm of breast: Secondary | ICD-10-CM

## 2019-02-09 ENCOUNTER — Encounter: Payer: Self-pay | Admitting: Family Medicine

## 2019-02-09 ENCOUNTER — Ambulatory Visit (INDEPENDENT_AMBULATORY_CARE_PROVIDER_SITE_OTHER): Payer: Medicare Other | Admitting: Family Medicine

## 2019-02-09 ENCOUNTER — Other Ambulatory Visit: Payer: Self-pay

## 2019-02-09 VITALS — BP 139/68 | HR 74 | Temp 97.6°F | Wt 251.0 lb

## 2019-02-09 DIAGNOSIS — R161 Splenomegaly, not elsewhere classified: Secondary | ICD-10-CM

## 2019-02-09 DIAGNOSIS — R1084 Generalized abdominal pain: Secondary | ICD-10-CM | POA: Diagnosis not present

## 2019-02-09 LAB — POCT URINALYSIS DIP (MANUAL ENTRY)
Bilirubin, UA: NEGATIVE
Glucose, UA: NEGATIVE mg/dL
Ketones, POC UA: NEGATIVE mg/dL
Nitrite, UA: NEGATIVE
Protein Ur, POC: NEGATIVE mg/dL
Spec Grav, UA: 1.02 (ref 1.010–1.025)
Urobilinogen, UA: 0.2 E.U./dL
pH, UA: 6 (ref 5.0–8.0)

## 2019-02-09 LAB — POCT UA - MICROSCOPIC ONLY

## 2019-02-09 LAB — POCT SEDIMENTATION RATE: POCT SED RATE: 10 mm/hr (ref 0–22)

## 2019-02-09 NOTE — Patient Instructions (Addendum)
I will call you with imaging results when they come back. Drink lots of water and rest until we figure out what else is going on. Go to the ED if things get worse.

## 2019-02-09 NOTE — Progress Notes (Signed)
CC: abd pain   HPI  Diarrhea - lots of burping, 3 loose BMs this am. Almost didn't make it to the bathroom yesterday. If she eats, she feels nauseous. This has been consistent x 2-3 months every day. Last week she didn't leave the house 2/2 nausea. No emesis at all. No new meds other than keflex for UTI, which she finished. No sick contacts. Mostly self isolating, other than grocery store. Hx of gallbladder and BTL in 1972. Somewhat similar experience 6 years ago, no dx made. Has also been using OTC tums for reflux sxs. No dysuria. No fevers or chills. She is eating less than previous, this changed last week.   HTN - she did take both amlodipine and prinzide today.   ROS: Denies CP, SOB, abdominal pain, dysuria, changes in BMs.   CC, SH/smoking status, and VS noted  Objective: BP 139/68   Pulse 74   Temp 97.6 F (36.4 C) (Oral)   Wt 251 lb (113.9 kg)   SpO2 95%   BMI 39.31 kg/m  Gen: NAD, alert, cooperative, and pleasant. HEENT: NCAT, EOMI, PERRL CV: RRR, no murmur Resp: CTAB, no wheezes, non-labored Abd: Soft, protuberant.  No appreciable midline hernias.  However, splenomegaly to about 4 inches below the left ribs and the spleen is tender to palpation.  Also tender over the epigastric area.  Normal bowel sounds in all 4 quadrants. Ext: No edema, warm Neuro: Alert and oriented, Speech clear, No gross deficits  Assessment and plan:  Diarrhea: Unclear etiology.  May be unrelated to splenomegaly.  CT abdomen and pelvis for below.  Continue good hydration and return if worsening.  Low suspicion for C. difficile given just one course of antibiotics recently.  Labs as below.  Splenomegaly: Patient has never noticed this before, and with tenderness, this needs further work-up.  She was scheduled for CT abdomen pelvis and labs included CBC with differential, peripheral smear, CRP, ESR, C MP.  Will call patient with lab results and she will call us if she has any concerns before her  imaging study is completed.  I reviewed her chart, no previous abdominal imaging that would show the spleen.  Orders Placed This Encounter  Procedures  . CT Abdomen Pelvis W Contrast    Standing Status:   Future    Standing Expiration Date:   05/11/2020    Order Specific Question:   ** REASON FOR EXAM (FREE TEXT)    Answer:   new splenomegaly on exam + 2 months diarrhea and nausea    Order Specific Question:   If indicated for the ordered procedure, I authorize the administration of contrast media per Radiology protocol    Answer:   Yes    Order Specific Question:   Preferred imaging location?    Answer:   [facility]    Order Specific Question:   Is Oral Contrast requested for this exam?    Answer:   Yes, Per Radiology protocol    Order Specific Question:   Radiology Contrast Protocol - do NOT remove file path    Answer:   [email or web address]  . CBC with Differential/Platelet  . CMP14+EGFR  . C-reactive protein  . Pathologist smear review  . POCT urinalysis dipstick  . POCT UA - Microscopic Only  . POCT SEDIMENTATION RATE    No orders of the defined types were placed in this encounter.   Kate Timberlake, MD, PGY3 02/10/2019 9:48 AM  

## 2019-02-10 LAB — CMP14+EGFR
ALT: 23 IU/L (ref 0–32)
AST: 28 IU/L (ref 0–40)
Albumin/Globulin Ratio: 1.6 (ref 1.2–2.2)
Albumin: 4.1 g/dL (ref 3.7–4.7)
Alkaline Phosphatase: 69 IU/L (ref 39–117)
BUN/Creatinine Ratio: 18 (ref 12–28)
BUN: 15 mg/dL (ref 8–27)
Bilirubin Total: 0.3 mg/dL (ref 0.0–1.2)
CO2: 25 mmol/L (ref 20–29)
Calcium: 9.7 mg/dL (ref 8.7–10.3)
Chloride: 101 mmol/L (ref 96–106)
Creatinine, Ser: 0.82 mg/dL (ref 0.57–1.00)
GFR calc Af Amer: 83 mL/min/{1.73_m2} (ref >59)
GFR calc non Af Amer: 72 mL/min/{1.73_m2} (ref >59)
Globulin, Total: 2.5 g/dL (ref 1.5–4.5)
Glucose: 123 mg/dL — ABNORMAL HIGH (ref 65–99)
Potassium: 4.4 mmol/L (ref 3.5–5.2)
Sodium: 142 mmol/L (ref 134–144)
Total Protein: 6.6 g/dL (ref 6.0–8.5)

## 2019-02-10 LAB — CBC WITH DIFFERENTIAL/PLATELET
Basophils Absolute: 0 10*3/uL (ref 0.0–0.2)
Basos: 0 %
EOS (ABSOLUTE): 0.1 10*3/uL (ref 0.0–0.4)
Eos: 1 %
Hematocrit: 40.1 % (ref 34.0–46.6)
Hemoglobin: 12.4 g/dL (ref 11.1–15.9)
Immature Grans (Abs): 0 10*3/uL (ref 0.0–0.1)
Immature Granulocytes: 0 %
Lymphocytes Absolute: 2.4 10*3/uL (ref 0.7–3.1)
Lymphs: 26 %
MCH: 25.5 pg — ABNORMAL LOW (ref 26.6–33.0)
MCHC: 30.9 g/dL — ABNORMAL LOW (ref 31.5–35.7)
MCV: 83 fL (ref 79–97)
Monocytes Absolute: 0.8 10*3/uL (ref 0.1–0.9)
Monocytes: 9 %
Neutrophils Absolute: 5.7 10*3/uL (ref 1.4–7.0)
Neutrophils: 64 %
Platelets: 292 10*3/uL (ref 150–450)
RBC: 4.86 x10E6/uL (ref 3.77–5.28)
RDW: 14.8 % (ref 11.7–15.4)
WBC: 9.1 10*3/uL (ref 3.4–10.8)

## 2019-02-10 LAB — C-REACTIVE PROTEIN: CRP: 2 mg/L (ref 0–10)

## 2019-02-10 LAB — PATHOLOGIST SMEAR REVIEW: IMMATURE GRANS (ABS): 0 10*3/uL (ref 0.0–0.1)

## 2019-02-14 LAB — PATHOLOGIST SMEAR REVIEW
Basophils Absolute: 0 10*3/uL (ref 0.0–0.2)
Basos: 1 %
EOS (ABSOLUTE): 0.1 10*3/uL (ref 0.0–0.4)
Eos: 1 %
Hematocrit: 40.6 % (ref 34.0–46.6)
Hemoglobin: 12.5 g/dL (ref 11.1–15.9)
Immature Grans (Abs): 0 10*3/uL (ref 0.0–0.1)
Immature Granulocytes: 0 %
Lymphocytes Absolute: 2.2 10*3/uL (ref 0.7–3.1)
Lymphs: 25 %
MCH: 24.9 pg — ABNORMAL LOW (ref 26.6–33.0)
MCHC: 30.8 g/dL — ABNORMAL LOW (ref 31.5–35.7)
MCV: 81 fL (ref 79–97)
Monocytes Absolute: 0.8 10*3/uL (ref 0.1–0.9)
Monocytes: 9 %
Neutrophils Absolute: 5.5 10*3/uL (ref 1.4–7.0)
Neutrophils: 64 %
Path Rev PLTs: NORMAL
Path Rev RBC: NORMAL
Path Rev WBC: NORMAL
Platelets: 277 10*3/uL (ref 150–450)
RBC: 5.02 x10E6/uL (ref 3.77–5.28)
RDW: 14.9 % (ref 11.7–15.4)
WBC: 8.7 10*3/uL (ref 3.4–10.8)

## 2019-02-15 ENCOUNTER — Other Ambulatory Visit: Payer: Self-pay

## 2019-02-15 ENCOUNTER — Ambulatory Visit (HOSPITAL_COMMUNITY)
Admission: RE | Admit: 2019-02-15 | Discharge: 2019-02-15 | Disposition: A | Discharge status: Home / Self Care | Payer: Medicare Other | Source: Ambulatory Visit | Attending: Family Medicine | Admitting: Family Medicine

## 2019-02-15 DIAGNOSIS — N281 Cyst of kidney, acquired: Secondary | ICD-10-CM | POA: Diagnosis not present

## 2019-02-15 DIAGNOSIS — R161 Splenomegaly, not elsewhere classified: Secondary | ICD-10-CM | POA: Insufficient documentation

## 2019-02-15 DIAGNOSIS — R197 Diarrhea, unspecified: Secondary | ICD-10-CM | POA: Diagnosis not present

## 2019-02-15 MED ORDER — IOHEXOL 300 MG/ML  SOLN
100.0000 mL | Freq: Once | INTRAMUSCULAR | Status: AC | PRN
  Administered 2019-02-15: 100 mL via INTRAVENOUS

## 2019-02-18 ENCOUNTER — Telehealth: Payer: Self-pay | Admitting: Family Medicine

## 2019-02-18 MED ORDER — OMEPRAZOLE 40 MG PO CPDR: 40.0000 mg | DELAYED_RELEASE_CAPSULE | Freq: Every day | ORAL | 0 refills | Status: DC

## 2019-02-18 NOTE — Telephone Encounter (Signed)
Called patient to discuss labs and imaging -  She states she is still having loose BMs but no pain, continued nausea. Still no emesis. 2 BMs yesterday, 1 today. She seems to have more nausea when her stomach is empty in the morning. She does have a hx of GERD, but hasn't been on any meds recently. We will trial 30d of PPI for possible gastritis. She was instructed to call back in 2 weeks if no help, f/u in 1 month to discuss weaning from PPI.   Additionally, CT abd/pelvis read did not comment on stomach/bowel, I called Dr. Leonia Reeves to clarify - he will addend read.

## 2019-02-19 ENCOUNTER — Other Ambulatory Visit: Payer: Self-pay | Admitting: *Deleted

## 2019-02-19 MED ORDER — LISINOPRIL-HYDROCHLOROTHIAZIDE 20-12.5 MG PO TABS: 1.0000 | ORAL_TABLET | Freq: Every day | ORAL | 3 refills | Status: DC

## 2019-03-02 ENCOUNTER — Other Ambulatory Visit: Payer: Self-pay

## 2019-03-02 MED ORDER — LEVOTHYROXINE SODIUM 88 MCG PO TABS: ORAL_TABLET | ORAL | 0 refills | Status: DC

## 2019-03-02 NOTE — Telephone Encounter (Signed)
Pt informed of below and stated she has to come in next month for A1C and will get this done at that visit. Shanea Karney Zimmerman Rumple, CMA

## 2019-03-02 NOTE — Telephone Encounter (Signed)
Patient has not had a TSH since 2018.  She will need appointment for recheck.  Rx sent without refills.

## 2019-03-05 ENCOUNTER — Other Ambulatory Visit: Payer: Self-pay

## 2019-03-11 ENCOUNTER — Other Ambulatory Visit: Payer: Self-pay

## 2019-03-11 ENCOUNTER — Ambulatory Visit
Admission: RE | Admit: 2019-03-11 | Discharge: 2019-03-11 | Disposition: A | Discharge status: Home / Self Care | Payer: Medicare Other | Source: Ambulatory Visit | Attending: Family Medicine | Admitting: Family Medicine

## 2019-03-11 DIAGNOSIS — Z1231 Encounter for screening mammogram for malignant neoplasm of breast: Secondary | ICD-10-CM

## 2019-03-16 DIAGNOSIS — H353112 Nonexudative age-related macular degeneration, right eye, intermediate dry stage: Secondary | ICD-10-CM | POA: Diagnosis not present

## 2019-03-16 DIAGNOSIS — H353221 Exudative age-related macular degeneration, left eye, with active choroidal neovascularization: Secondary | ICD-10-CM | POA: Diagnosis not present

## 2019-03-16 DIAGNOSIS — H3570 Unspecified separation of retinal layers: Secondary | ICD-10-CM | POA: Diagnosis not present

## 2019-03-18 ENCOUNTER — Other Ambulatory Visit: Payer: Self-pay

## 2019-03-18 MED ORDER — OMEPRAZOLE 40 MG PO CPDR: 40.0000 mg | DELAYED_RELEASE_CAPSULE | Freq: Every day | ORAL | 3 refills | Status: DC

## 2019-03-24 DIAGNOSIS — Z9883 Filtering (vitreous) bleb after glaucoma surgery status: Secondary | ICD-10-CM | POA: Diagnosis not present

## 2019-03-24 DIAGNOSIS — H353221 Exudative age-related macular degeneration, left eye, with active choroidal neovascularization: Secondary | ICD-10-CM | POA: Diagnosis not present

## 2019-03-24 DIAGNOSIS — H353111 Nonexudative age-related macular degeneration, right eye, early dry stage: Secondary | ICD-10-CM | POA: Diagnosis not present

## 2019-03-24 DIAGNOSIS — H401122 Primary open-angle glaucoma, left eye, moderate stage: Secondary | ICD-10-CM | POA: Diagnosis not present

## 2019-03-24 DIAGNOSIS — E119 Type 2 diabetes mellitus without complications: Secondary | ICD-10-CM | POA: Diagnosis not present

## 2019-03-24 LAB — HM DIABETES EYE EXAM

## 2019-04-08 ENCOUNTER — Encounter: Payer: Self-pay | Admitting: Family Medicine

## 2019-04-08 ENCOUNTER — Ambulatory Visit (INDEPENDENT_AMBULATORY_CARE_PROVIDER_SITE_OTHER): Payer: Medicare Other | Admitting: Family Medicine

## 2019-04-08 ENCOUNTER — Other Ambulatory Visit: Payer: Self-pay

## 2019-04-08 VITALS — BP 160/74 | HR 67 | Wt 254.6 lb

## 2019-04-08 DIAGNOSIS — M159 Polyosteoarthritis, unspecified: Secondary | ICD-10-CM | POA: Diagnosis not present

## 2019-04-08 DIAGNOSIS — E038 Other specified hypothyroidism: Secondary | ICD-10-CM | POA: Diagnosis not present

## 2019-04-08 DIAGNOSIS — E119 Type 2 diabetes mellitus without complications: Secondary | ICD-10-CM | POA: Diagnosis not present

## 2019-04-08 DIAGNOSIS — I10 Essential (primary) hypertension: Secondary | ICD-10-CM | POA: Diagnosis not present

## 2019-04-08 LAB — POCT GLYCOSYLATED HEMOGLOBIN (HGB A1C): HbA1c, POC (controlled diabetic range): 7.4 % — AB (ref 0.0–7.0)

## 2019-04-08 NOTE — Assessment & Plan Note (Signed)
Check TSH 

## 2019-04-08 NOTE — Assessment & Plan Note (Signed)
Check home BPs.  Call if elevated.

## 2019-04-08 NOTE — Assessment & Plan Note (Signed)
Flair of back problems with right sided muscle spasm.

## 2019-04-08 NOTE — Patient Instructions (Addendum)
Good job with the diabetes.  Your A1C is lower again at 7.4.  Keep up not buying sweets.  I hope you can get back to walking more.  Please check your blood pressure several times over the next few weeks when you are not in pain.  The goal is 140/90 or less.  If it is higher than that, please call and I will increase your blood pressure medicine.  I think you have back spasms for your bad back.  Tylenol is the safest medication.  You know the drill.  Heating pads and topical creams are also helpful.  Call me if you are not getting better.  Let me know if you break out in a rash on your back.

## 2019-04-08 NOTE — Progress Notes (Signed)
Established Patient Office Visit  Subjective:  Patient ID: Kaitlyn Ferguson, female    DOB: 1945/11/09  Age: 73 y.o. MRN: [id number]  CC:  Chief Complaint  Patient presents with  . Diabetes    HPI [name] presents for DM and more DM.  Has worked on eliminating sweats from diet.  Has not been able to exercise as much.  Wt has not come down.  Nicely, A1C has improved to 7.3 2. Right back sore for 2 days.  Longstanding back problems and previous remote back surg.  No leg pain.  No bowel changes.  No fever, nausea, urgency, frequency dysuria or hematuria.  No rash. 3. Hypothyroid.  Due for TSH. 4. HPDP.  Needs pneumonia vaccine.  Has had eye exams but we do not have a copy.  Needs diabetic foot exam.  Past Medical History:  Diagnosis Date  . Arthritis   . Diabetes mellitus   . DJD (degenerative joint disease) 10/07/2008  . DJD (degenerative joint disease)   . Family history of adverse reaction to anesthesia    MOTHER WAS DIFFICULT TO WAKE UP  . Hyperlipidemia   . Hypertension   . Hypothyroidism   . Nausea    INTERMITANT - TAKES PRILOSEC (DENIES REFLUX)  . Obesity   . Osteomyelitis of ankle (HCC)    Complications of Right ankle injury    Past Surgical History:  Procedure Laterality Date  . ABDOMINAL HYSTERECTOMY    . ANKLE FUSION  02/1996   Right ankle tib/talar  . BACK SURGERY  1984  . BREAST EXCISIONAL BIOPSY Right   . CARPAL TUNNEL RELEASE  1993   Right arm  . CHOLECYSTECTOMY    . EYE SURGERY Left   . NECK MASS EXCISION  12/29/2001  . right eye laser    . TONSILLECTOMY     Childhood  . TOTAL KNEE ARTHROPLASTY Left 12/21/2015   Procedure: LEFT TOTAL KNEE ARTHROPLASTY WITH COMPLEX COMPUTER NAVIGATION ;  Surgeon: Rod Can, MD;  Location: [facility];  Service: Orthopedics;  Laterality: Left;  . TOTAL SHOULDER ARTHROPLASTY Right 07/11/2016   Procedure: RIGHT TOTAL SHOULDER ARTHROPLASTY;  Surgeon: Justice Britain, MD;  Location: [facility];  Service:  Orthopedics;  Laterality: Right;    Family History  Problem Relation Age of Onset  . Diabetes Brother   . Diabetes Sister        DM2-lost weight-now okay  . Heart attack Father        First MI- early 36's  . Stroke Mother   . Heart failure Mother   . Multiple myeloma Mother        Deceased at 44 due to same  . Breast cancer Maternal Aunt     Social History   Socioeconomic History  . Marital status: Widowed    Spouse name: Not on file  . Number of children: 6  . Years of education: Not on file  . Highest education level: Not on file  Occupational History  . Not on file  Social Needs  . Financial resource strain: Not on file  . Food insecurity    Worry: Not on file    Inability: Not on file  . Transportation needs    Medical: Not on file    Non-medical: Not on file  Tobacco Use  . Smoking status: Never Smoker  . Smokeless tobacco: Never Used  . Tobacco comment: No tobacco currently -   Substance and Sexual Activity  . Alcohol use: No  Alcohol/week: 0.0 standard drinks  . Drug use: No  . Sexual activity: Never  Lifestyle  . Physical activity    Days per week: Not on file    Minutes per session: Not on file  . Stress: Not on file  Relationships  . Social Herbalist on phone: Not on file    Gets together: Not on file    Attends religious service: Not on file    Active member of club or organization: Not on file    Attends meetings of clubs or organizations: Not on file    Relationship status: Not on file  . Intimate partner violence    Fear of current or ex partner: Not on file    Emotionally abused: Not on file    Physically abused: Not on file    Forced sexual activity: Not on file  Other Topics Concern  . Not on file  Social History Narrative  . Not on file    Outpatient Medications Prior to Visit  Medication Sig Dispense Refill  . amLODipine (NORVASC) 5 MG tablet TAKE 1 TABLET BY MOUTH EVERY DAY 90 tablet 3  . aspirin EC 81 MG tablet  Take 1 tablet (81 mg total) by mouth daily. 90 tablet 1  . Blood Glucose Monitoring Suppl (ONETOUCH VERIO FLEX SYSTEM) w/Device KIT 1 application by Does not apply route 2 (two) times daily. 1 kit 1  . COMBIGAN 0.2-0.5 % ophthalmic solution Place 1 drop into the left eye 2 (two) times daily.   0  . glucose blood (ONETOUCH VERIO) test strip Use to check blood sugar twice daily or as directed 100 each 12  . ibuprofen (ADVIL,MOTRIN) 200 MG tablet Take 200 mg by mouth every 8 (eight) hours as needed (for general aches/pain.).    . Insulin Pen Needle (B-D UF III MINI PEN NEEDLES) 31G X 5 MM MISC USE TWICE DAILY 100 each 12  . Lancets (FREESTYLE) lancets 1 each by Other route 2 (two) times daily. Use as instructed 100 each PRN  . levothyroxine (SYNTHROID) 88 MCG tablet Take one tablet PO every day before breakfast 90 tablet 0  . liraglutide (VICTOZA) 18 MG/3ML SOPN inject 0.3 milliliters (1.8 MG TOTAL) subcutaneously once daily 9 mL 11  . lisinopril-hydrochlorothiazide (ZESTORETIC) 20-12.5 MG tablet Take 1 tablet by mouth daily. 90 tablet 3  . metFORMIN (GLUCOPHAGE) 500 MG tablet TAKE 2 TABLETS BY MOUTH TWICE DAILY WITH MEALS 360 tablet 1  . Multiple Vitamin (MULTIVITAMIN WITH MINERALS) TABS tablet Take 1 tablet by mouth daily. With Vitamin D3    . omeprazole (PRILOSEC) 40 MG capsule Take 1 capsule (40 mg total) by mouth daily. 90 capsule 3  . phenazopyridine (PYRIDIUM) 200 MG tablet Take 1 tablet (200 mg total) by mouth 3 (three) times daily as needed for pain. 10 tablet 0  . pilocarpine (PILOCAR) 2 % ophthalmic solution Place 1 drop into the left eye 3 (three) times daily.    . rosuvastatin (CRESTOR) 20 MG tablet Take 1 tablet (20 mg total) by mouth every evening. 90 tablet 2  . TRAVATAN Z 0.004 % SOLN ophthalmic solution Place 1 drop into the left eye daily.   1  . TRESIBA FLEXTOUCH 100 UNIT/ML SOPN FlexTouch Pen INJECT 60 UNITS UNDER THE SKIN ONCE DAILY AS DIRECTED 30 mL 1   No facility-administered  medications prior to visit.     Allergies  Allergen Reactions  . Gabapentin Nausea Only  . Vancomycin Itching  Pre-medicated with benadryl and tylenol 1 hr before administration of vanc    ROS Review of Systems    Objective:    Physical Exam  BP (!) 160/74   Pulse 67   Wt 254 lb 9.6 oz (115.5 kg)   SpO2 96%   BMI 39.88 kg/m  Wt Readings from Last 3 Encounters:  04/08/19 254 lb 9.6 oz (115.5 kg)  02/09/19 251 lb (113.9 kg)  01/01/19 251 lb (113.9 kg)  Right paraspinous muscles tender to deep palpation.  No skin hypersensitivity.   Diabetic foot exam done.   BP up.  Maybe because of pain.   Health Maintenance Due  Topic Date Due  . PNA vac Low Risk Adult (1 of 2 - PCV13) 04/07/2011  . OPHTHALMOLOGY EXAM  12/25/2014  . FOOT EXAM  08/11/2018  . INFLUENZA VACCINE  03/27/2019    There are no preventive care reminders to display for this patient.  Lab Results  Component Value Date   TSH 2.160 08/11/2017   Lab Results  Component Value Date   WBC 8.7 02/09/2019   HGB 12.5 02/09/2019   HCT 40.6 02/09/2019   MCV 81 02/09/2019   PLT 277 02/09/2019   Lab Results  Component Value Date   NA 142 02/09/2019   K 4.4 02/09/2019   CO2 25 02/09/2019   GLUCOSE 123 (H) 02/09/2019   BUN 15 02/09/2019   CREATININE 0.82 02/09/2019   BILITOT 0.3 02/09/2019   ALKPHOS 69 02/09/2019   AST 28 02/09/2019   ALT 23 02/09/2019   PROT 6.6 02/09/2019   ALBUMIN 4.1 02/09/2019   CALCIUM 9.7 02/09/2019   ANIONGAP 9 07/02/2016   Lab Results  Component Value Date   CHOL 88 (L) 04/23/2018   Lab Results  Component Value Date   HDL 38 (L) 04/23/2018   Lab Results  Component Value Date   LDLCALC 20 04/23/2018   Lab Results  Component Value Date   TRIG 148 04/23/2018   Lab Results  Component Value Date   CHOLHDL 2.3 04/23/2018   Lab Results  Component Value Date   HGBA1C 7.4 (A) 04/08/2019      Assessment & Plan:   Problem List Items Addressed This Visit     Diabetes mellitus without complication ([place]) - Primary (Chronic)   Relevant Orders   POCT glycosylated hemoglobin (Hb A1C) (Completed)   Hypothyroidism (Chronic)   Relevant Orders   TSH      No orders of the defined types were placed in this encounter.   Follow-up: No follow-ups on file.    [name], MD

## 2019-04-08 NOTE — Progress Notes (Signed)
2

## 2019-04-08 NOTE — Assessment & Plan Note (Signed)
Doing well.  Add increased physical activity.   Get diabetic eye exam.   Foot exame normal

## 2019-04-09 LAB — TSH: TSH: 2.71 u[IU]/mL (ref 0.450–4.500)

## 2019-04-14 ENCOUNTER — Telehealth: Payer: Self-pay

## 2019-04-14 NOTE — Telephone Encounter (Signed)
By my records, patient injects insulin once daily and tests blood sugar twice daily.

## 2019-04-14 NOTE — Telephone Encounter (Signed)
Kaitlyn Ferguson, with [facility], called nurse line to verify insulin and testing regime. In order to supply patient with diabetic supplies, they need to know how often she is checking her sugars and injecting insulin. Please advise.

## 2019-04-15 NOTE — Telephone Encounter (Signed)
Pharmacy informed. Tyishia Aune T Makaveli Hoard, CMA  

## 2019-05-07 ENCOUNTER — Other Ambulatory Visit: Payer: Self-pay

## 2019-05-07 DIAGNOSIS — E119 Type 2 diabetes mellitus without complications: Secondary | ICD-10-CM

## 2019-05-07 MED ORDER — METFORMIN HCL 500 MG PO TABS: ORAL_TABLET | ORAL | 1 refills | Status: DC

## 2019-05-07 MED ORDER — AMLODIPINE BESYLATE 5 MG PO TABS: 5.0000 mg | ORAL_TABLET | Freq: Every day | ORAL | 3 refills | Status: DC

## 2019-05-07 MED ORDER — ROSUVASTATIN CALCIUM 20 MG PO TABS: 20.0000 mg | ORAL_TABLET | Freq: Every evening | ORAL | 2 refills | Status: DC

## 2019-05-07 MED ORDER — TRESIBA FLEXTOUCH 100 UNIT/ML ~~LOC~~ SOPN: PEN_INJECTOR | SUBCUTANEOUS | 1 refills | Status: DC

## 2019-05-07 NOTE — Telephone Encounter (Signed)
Please send Rx to [facility]. [place]. Ottis Stain, CMA

## 2019-05-11 DIAGNOSIS — H353221 Exudative age-related macular degeneration, left eye, with active choroidal neovascularization: Secondary | ICD-10-CM | POA: Diagnosis not present

## 2019-05-11 DIAGNOSIS — H353123 Nonexudative age-related macular degeneration, left eye, advanced atrophic without subfoveal involvement: Secondary | ICD-10-CM | POA: Diagnosis not present

## 2019-05-11 DIAGNOSIS — H35371 Puckering of macula, right eye: Secondary | ICD-10-CM | POA: Diagnosis not present

## 2019-05-11 DIAGNOSIS — H3562 Retinal hemorrhage, left eye: Secondary | ICD-10-CM | POA: Diagnosis not present

## 2019-05-11 DIAGNOSIS — E119 Type 2 diabetes mellitus without complications: Secondary | ICD-10-CM | POA: Diagnosis not present

## 2019-05-29 ENCOUNTER — Other Ambulatory Visit: Payer: Self-pay | Admitting: Family Medicine

## 2019-05-29 DIAGNOSIS — E038 Other specified hypothyroidism: Secondary | ICD-10-CM

## 2019-06-17 ENCOUNTER — Ambulatory Visit (INDEPENDENT_AMBULATORY_CARE_PROVIDER_SITE_OTHER): Payer: Medicare Other

## 2019-06-17 VITALS — Ht 67.0 in | Wt 254.0 lb

## 2019-06-17 DIAGNOSIS — Z0000 Encounter for general adult medical examination without abnormal findings: Secondary | ICD-10-CM | POA: Diagnosis not present

## 2019-06-17 NOTE — Progress Notes (Signed)
Subjective:   Kaitlyn Ferguson is a 73 y.o. female who presents for Medicare Annual (Subsequent) preventive examination.  The patient consented to a virtual visit.  Review of Systems: Defer to PCP.  Cardiac Risk Factors include: advanced age (>9mn, >>61women);diabetes mellitus;obesity (BMI >30kg/m2)  Objective:   Vitals: Ht '5\' 7"'  (1.702 m)   Wt 254 lb (115.2 kg)   BMI 39.78 kg/m   Body mass index is 39.78 kg/m.  Advanced Directives 06/17/2019 02/09/2019 01/19/2019 09/23/2017 08/11/2017 05/08/2017 04/09/2017  Does Patient Have a Medical Advance Directive? Yes No No No No No No  Type of Advance Directive [place] Does patient want to make changes to medical advance directive? No - Guardian declined - - - - - -  Copy of [place]? No - copy requested - - - - - -  Would patient like information on creating a medical advance directive? - No - Patient declined No - Patient declined Yes (MAU/Ambulatory/Procedural Areas - Information given);No - Patient declined No - Patient declined No - Patient declined No - Patient declined   Tobacco Social History   Tobacco Use  Smoking Status Never Smoker  Smokeless Tobacco Never Used  Tobacco Comment   No tobacco currently -      Clinical Intake:  Pre-visit preparation completed: Yes  How often do you need to have someone help you when you read instructions, pamphlets, or other written materials from your doctor or pharmacy?: 2 - Rarely What is the last grade level you completed in school?: GED  Interpreter Needed?: No  Past Medical History:  Diagnosis Date  . Arthritis   . Diabetes mellitus   . DJD (degenerative joint disease) 10/07/2008  . DJD (degenerative joint disease)   . Family history of adverse reaction to anesthesia    MOTHER WAS DIFFICULT TO WAKE UP  . Hyperlipidemia   . Hypertension   . Hypothyroidism   . Nausea    INTERMITANT - TAKES PRILOSEC (DENIES  REFLUX)  . Obesity   . Osteomyelitis of ankle (HCC)    Complications of Right ankle injury   Past Surgical History:  Procedure Laterality Date  . ABDOMINAL HYSTERECTOMY    . ANKLE FUSION  02/1996   Right ankle tib/talar  . BACK SURGERY  1984  . BREAST EXCISIONAL BIOPSY Right   . CARPAL TUNNEL RELEASE  1993   Right arm  . CHOLECYSTECTOMY    . EYE SURGERY Left   . NECK MASS EXCISION  12/29/2001  . right eye laser    . TONSILLECTOMY     Childhood  . TOTAL KNEE ARTHROPLASTY Left 12/21/2015   Procedure: LEFT TOTAL KNEE ARTHROPLASTY WITH COMPLEX COMPUTER NAVIGATION ;  Surgeon: BRod Can MD;  Location: [facility];  Service: Orthopedics;  Laterality: Left;  . TOTAL SHOULDER ARTHROPLASTY Right 07/11/2016   Procedure: RIGHT TOTAL SHOULDER ARTHROPLASTY;  Surgeon: KJustice Britain MD;  [place] [facility]  Service: Orthopedics;  Laterality: Right;   Family History  Problem Relation Age of Onset  . Diabetes Brother   . Diabetes Sister        DM2-lost weight-now okay  . Heart attack Father        First MI- early 572's . Stroke Mother   . Heart failure Mother   . Multiple myeloma Mother        Deceased at 78due to same  . Breast cancer Maternal Aunt   .  Heart attack Son    Social History   Socioeconomic History  . Marital status: Widowed    Spouse name: Not on file  . Number of children: 6  . Years of education: 35  . Highest education level: GED or equivalent  Occupational History  . Occupation: Retired     Comment: worked in Barrister's clerk   Social Needs  . Financial resource strain: Not hard at all  . Food insecurity    Worry: Never true    Inability: Never true  . Transportation needs    Medical: No    Non-medical: No  Tobacco Use  . Smoking status: Never Smoker  . Smokeless tobacco: Never Used  . Tobacco comment: No tobacco currently -   Substance and Sexual Activity  . Alcohol use: No    Alcohol/week: 0.0 standard drinks  . Drug use: No  . Sexual activity: Not  Currently  Lifestyle  . Physical activity    Days per week: 0 days    Minutes per session: 0 min  . Stress: Not at all  Relationships  . Social connections    Talks on phone: More than three times a week    Gets together: More than three times a week    Attends religious service: 1 to 4 times per year    Active member of club or organization: No    Attends meetings of clubs or organizations: Never    Relationship status: Widowed  Other Topics Concern  . Not on file  Social History Narrative   Patient lives alone in [place] in a senior community.    Patient does a lot of actives within her senior community.    Patient enjoys crafts and no sew blankets. Often makes them for the homeless or newborns.   Patient has 6 children, they all live within the area and are all close.   Outpatient Encounter Medications as of 06/17/2019  Medication Sig  . amLODipine (NORVASC) 5 MG tablet Take 1 tablet (5 mg total) by mouth daily.  Marland Kitchen aspirin EC 81 MG tablet Take 1 tablet (81 mg total) by mouth daily.  . Blood Glucose Monitoring Suppl (ONETOUCH VERIO FLEX SYSTEM) w/Device KIT 1 application by Does not apply route 2 (two) times daily.  . COMBIGAN 0.2-0.5 % ophthalmic solution Place 1 drop into the left eye 2 (two) times daily.   Marland Kitchen glucose blood (ONETOUCH VERIO) test strip Use to check blood sugar twice daily or as directed  . insulin degludec (TRESIBA FLEXTOUCH) 100 UNIT/ML SOPN FlexTouch Pen INJECT 60 UNITS UNDER THE SKIN ONCE DAILY AS DIRECTED  . Insulin Pen Needle (B-D UF III MINI PEN NEEDLES) 31G X 5 MM MISC USE TWICE DAILY  . Lancets (FREESTYLE) lancets 1 each by Other route 2 (two) times daily. Use as instructed  . levothyroxine (SYNTHROID) 88 MCG tablet TAKE 1 TABLET BY MOUTH EVERY DAY BEFORE BREAKFAST  . liraglutide (VICTOZA) 18 MG/3ML SOPN inject 0.3 milliliters (1.8 MG TOTAL) subcutaneously once daily  . lisinopril-hydrochlorothiazide (ZESTORETIC) 20-12.5 MG tablet Take 1 tablet by mouth  daily.  . metFORMIN (GLUCOPHAGE) 500 MG tablet TAKE 2 TABLETS BY MOUTH TWICE DAILY WITH MEALS  . Multiple Vitamin (MULTIVITAMIN WITH MINERALS) TABS tablet Take 1 tablet by mouth daily. With Vitamin D3  . omeprazole (PRILOSEC) 40 MG capsule Take 1 capsule (40 mg total) by mouth daily.  . pilocarpine (PILOCAR) 2 % ophthalmic solution Place 1 drop into the left eye 3 (three) times daily.  Marland Kitchen  rosuvastatin (CRESTOR) 20 MG tablet Take 1 tablet (20 mg total) by mouth every evening.  . TRAVATAN Z 0.004 % SOLN ophthalmic solution Place 1 drop into the left eye daily.   Marland Kitchen ibuprofen (ADVIL,MOTRIN) 200 MG tablet Take 200 mg by mouth every 8 (eight) hours as needed (for general aches/pain.).  . [DISCONTINUED] Insulin Syringe-Needle U-100 31G X 5/16" 1 ML MISC Use with insulin as directed.  . [DISCONTINUED] phenazopyridine (PYRIDIUM) 200 MG tablet Take 1 tablet (200 mg total) by mouth 3 (three) times daily as needed for pain. (Patient not taking: Reported on 06/17/2019)   No facility-administered encounter medications on file as of 06/17/2019.    Activities of Daily Living In your present state of health, do you have any difficulty performing the following activities: 06/17/2019  Hearing? N  Vision? N  Difficulty concentrating or making decisions? N  Walking or climbing stairs? Y  Comment accident resulting in ankle injury  Dressing or bathing? N  Doing errands, shopping? N  Preparing Food and eating ? N  Using the Toilet? N  In the past six months, have you accidently leaked urine? Y  Do you have problems with loss of bowel control? N  Managing your Medications? N  Managing your Finances? N  Housekeeping or managing your Housekeeping? N  Some recent data might be hidden   Patient Care Team: [name], MD as PCP - General (Family Medicine) Kennith Center, RD as Dietitian (Family Medicine)    Assessment:   This is a routine wellness examination for [place].  Exercise Activities and  Dietary recommendations Current Exercise Habits: The patient does not participate in regular exercise at present, Exercise limited by: orthopedic condition(s)  Goals    . Blood Pressure < 150/90    . Exercise 3x per week (30 min per time)     Walk for 15 minutes in AM and walk 15 minutes in PM    . HEMOGLOBIN A1C < 7     7.3 last checked 03/2019.       Fall Risk Fall Risk  06/17/2019 04/08/2019 01/19/2019 09/23/2017 08/11/2017  Falls in the past year? 0 0 0 No No  Number falls in past yr: - 0 0 - -  Injury with Fall? - - - - -  Risk for fall due to : - - - - -  Follow up - Falls evaluation completed - - -   Is the patient's home free of loose throw rugs in walkways, pet beds, electrical cords, etc?   yes      Grab bars in the bathroom? yes      Handrails on the stairs?   yes      Adequate lighting?   yes  Patient rating of health (0-10) scale: 5  Depression Screen PHQ 2/9 Scores 06/17/2019 04/08/2019 02/09/2019 01/19/2019  PHQ - 2 Score 0 0 0 0    Cognitive Function  6CIT Screen 06/17/2019  What Year? 0 points  What month? 0 points  What time? 0 points  Count back from 20 0 points  Months in reverse 0 points  Repeat phrase 0 points  Total Score 0   Immunization History  Administered Date(s) Administered  . Influenza Split 05/10/2011, 07/26/2011, 05/12/2012  . Influenza Whole 06/01/2008, 08/11/2009, 09/05/2010  . Influenza,inj,Quad PF,6+ Mos 08/04/2013, 06/01/2014, 05/16/2015, 05/23/2016, 05/08/2017, 04/23/2018  . Influenza-Unspecified 05/11/2019  . Td 10/01/2004  . Tdap 01/17/2017   Screening Tests Health Maintenance  Topic Date Due  . PNA  vac Low Risk Adult (1 of 2 - PCV13) 04/07/2011  . OPHTHALMOLOGY EXAM  12/25/2014  . INFLUENZA VACCINE  03/27/2019  . LIPID PANEL  04/24/2019  . HEMOGLOBIN A1C  10/09/2019  . FOOT EXAM  04/07/2020  . MAMMOGRAM  03/10/2021  . TETANUS/TDAP  01/18/2027  . COLONOSCOPY  06/11/2027  . DEXA SCAN  Completed  . Hepatitis C Screening   Completed   Cancer Screenings: Lung: Low Dose CT Chest recommended if Age 67-80 years, 30 pack-year currently smoking OR have quit w/in 15years. Patient does not qualify. Breast:  Up to date on Mammogram? Yes   Up to date of Bone Density/Dexa? Yes Colorectal: Completed- next due 05/2027  Additional Screenings: Hepatitis C Screening: Completed  Plan:  Start walking for some exercise! 15 minutes in AM and 15 minutes in PM.  This will help with lowering your A1C to goal and help your blood pressure goal as well.  Bring a copy of your advance directive so we can scan to chart. Keep your upcoming scheduled eye apts!  I have personally reviewed and noted the following in the patient's chart:   . Medical and social history . Use of alcohol, tobacco or illicit drugs  . Current medications and supplements . Functional ability and status . Nutritional status . Physical activity . Advanced directives . List of other physicians . Hospitalizations, surgeries, and ER visits in previous 12 months . Vitals . Screenings to include cognitive, depression, and falls . Referrals and appointments  In addition, I have reviewed and discussed with patient certain preventive protocols, quality metrics, and best practice recommendations. A written personalized care plan for preventive services as well as general preventive health recommendations were provided to patient.  This visit was conducted virtually in the setting of the [place] pandemic.    Dorna Bloom, Home  06/17/2019

## 2019-06-17 NOTE — Patient Instructions (Addendum)
You spoke to Kaitlyn Ferguson, [place] over the phone for your annual wellness visit.  We discussed goals: Goals    . Blood Pressure < 150/90    . Exercise 3x per week (30 min per time)     Walk for 15 minutes in AM and walk 15 minutes in PM    . HEMOGLOBIN A1C < 7     7.3 last checked 03/2019.      We also discussed recommended health maintenance. Please call our office and schedule a visit. As discussed, you are mostly up to date with everything! I updated your chart to reflect the flu vaccine given by [facility]. You have a diabetic eye apt set up for 07/07/2019 and a regular eye exam on 07/27/2019.  Health Maintenance  Topic Date Due  . PNA vac Low Risk Adult (1 of 2 - PCV13) 04/07/2011  . OPHTHALMOLOGY EXAM  12/25/2014  . INFLUENZA VACCINE  03/27/2019  . LIPID PANEL  04/24/2019  . HEMOGLOBIN A1C  10/09/2019  . FOOT EXAM  04/07/2020  . MAMMOGRAM  03/10/2021  . TETANUS/TDAP  01/18/2027  . COLONOSCOPY  06/11/2027  . DEXA SCAN  Completed  . Hepatitis C Screening  Completed   Start walking for some exercise! 15 minutes in AM and 15 minutes in PM.  This will help with lowering your A1C to goal and help your blood pressure goal as well.  Bring a copy of your advance directive so we can scan to chart. Keep your upcoming scheduled eye apts!  Preventive Care 73 Years and Older, Female Preventive care refers to lifestyle choices and visits with your health care provider that can promote health and wellness. This includes:  A yearly physical exam. This is also called an annual well check.  Regular dental and eye exams.  Immunizations.  Screening for certain conditions.  Healthy lifestyle choices, such as diet and exercise. What can I expect for my preventive care visit? Physical exam Your health care provider will check:  Height and weight. These may be used to calculate body mass index (BMI), which is a measurement that tells if you are at a healthy weight.  Heart rate and blood  pressure.  Your skin for abnormal spots. Counseling Your health care provider may ask you questions about:  Alcohol, tobacco, and drug use.  Emotional well-being.  Home and relationship well-being.  Sexual activity.  Eating habits.  History of falls.  Memory and ability to understand (cognition).  Work and work Statistician.  Pregnancy and menstrual history. What immunizations do I need?  Influenza (flu) vaccine  This is recommended every year. Tetanus, diphtheria, and pertussis (Tdap) vaccine  You may need a Td booster every 10 years. Varicella (chickenpox) vaccine  You may need this vaccine if you have not already been vaccinated. Zoster (shingles) vaccine  You may need this after age 60. Pneumococcal conjugate (PCV13) vaccine  One dose is recommended after age 77. Pneumococcal polysaccharide (PPSV23) vaccine  One dose is recommended after age 1. Measles, mumps, and rubella (MMR) vaccine  You may need at least one dose of MMR if you were born in 1957 or later. You may also need a second dose. Meningococcal conjugate (MenACWY) vaccine  You may need this if you have certain conditions. Hepatitis A vaccine  You may need this if you have certain conditions or if you travel or work in places where you may be exposed to hepatitis A. Hepatitis B vaccine  You may need this if you  have certain conditions or if you travel or work in places where you may be exposed to hepatitis B. Haemophilus influenzae type b (Hib) vaccine  You may need this if you have certain conditions. You may receive vaccines as individual doses or as more than one vaccine together in one shot (combination vaccines). Talk with your health care provider about the risks and benefits of combination vaccines. What tests do I need? Blood tests  Lipid and cholesterol levels. These may be checked every 5 years, or more frequently depending on your overall health.  Hepatitis C test.  Hepatitis B  test. Screening  Lung cancer screening. You may have this screening every year starting at age 78 if you have a 30-pack-year history of smoking and currently smoke or have quit within the past 15 years.  Colorectal cancer screening. All adults should have this screening starting at age 74 and continuing until age 32. Your health care provider may recommend screening at age 55 if you are at increased risk. You will have tests every 1-10 years, depending on your results and the type of screening test.  Diabetes screening. This is done by checking your blood sugar (glucose) after you have not eaten for a while (fasting). You may have this done every 1-3 years.  Mammogram. This may be done every 1-2 years. Talk with your health care provider about how often you should have regular mammograms.  BRCA-related cancer screening. This may be done if you have a family history of breast, ovarian, tubal, or peritoneal cancers. Other tests  Sexually transmitted disease (STD) testing.  Bone density scan. This is done to screen for osteoporosis. You may have this done starting at age 34. Follow these instructions at home: Eating and drinking  Eat a diet that includes fresh fruits and vegetables, whole grains, lean protein, and low-fat dairy products. Limit your intake of foods with high amounts of sugar, saturated fats, and salt.  Take vitamin and mineral supplements as recommended by your health care provider.  Do not drink alcohol if your health care provider tells you not to drink.  If you drink alcohol: ? Limit how much you have to 0-1 drink a day. ? Be aware of how much alcohol is in your drink. In the [place] one drink equals one 12 oz bottle of beer (355 mL), one 5 oz glass of wine (148 mL), or one 1 oz glass of hard liquor (44 mL). Lifestyle  Take daily care of your teeth and gums.  Stay active. Exercise for at least 30 minutes on 5 or more days each week.  Do not use any products that  contain nicotine or tobacco, such as cigarettes, e-cigarettes, and chewing tobacco. If you need help quitting, ask your health care provider.  If you are sexually active, practice safe sex. Use a condom or other form of protection in order to prevent STIs (sexually transmitted infections).  Talk with your health care provider about taking a low-dose aspirin or statin. What's next?  Go to your health care provider once a year for a well check visit.  Ask your health care provider how often you should have your eyes and teeth checked.  Stay up to date on all vaccines. This information is not intended to replace advice given to you by your health care provider. Make sure you discuss any questions you have with your health care provider. Document Released: 09/08/2015 Document Revised: 08/06/2018 Document Reviewed: 08/06/2018 Elsevier Patient Education  2020 [employer].  Our clinic's number is [phone]. Please call with questions or concerns about what we discussed today.

## 2019-06-18 NOTE — Progress Notes (Signed)
Patient ID: Kaitlyn Ferguson, female   DOB: 02/03/1946, 73 y.o.   MRN: [id number]  I have reviewed this visit and agree with the documentation.

## 2019-06-23 ENCOUNTER — Encounter: Payer: Self-pay | Admitting: Family Medicine

## 2019-06-30 DIAGNOSIS — M2042 Other hammer toe(s) (acquired), left foot: Secondary | ICD-10-CM | POA: Diagnosis not present

## 2019-06-30 DIAGNOSIS — M2041 Other hammer toe(s) (acquired), right foot: Secondary | ICD-10-CM | POA: Diagnosis not present

## 2019-06-30 DIAGNOSIS — E139 Other specified diabetes mellitus without complications: Secondary | ICD-10-CM | POA: Diagnosis not present

## 2019-07-06 DIAGNOSIS — H353123 Nonexudative age-related macular degeneration, left eye, advanced atrophic without subfoveal involvement: Secondary | ICD-10-CM | POA: Diagnosis not present

## 2019-07-06 DIAGNOSIS — H353221 Exudative age-related macular degeneration, left eye, with active choroidal neovascularization: Secondary | ICD-10-CM | POA: Diagnosis not present

## 2019-07-06 DIAGNOSIS — H35371 Puckering of macula, right eye: Secondary | ICD-10-CM | POA: Diagnosis not present

## 2019-07-06 DIAGNOSIS — H35722 Serous detachment of retinal pigment epithelium, left eye: Secondary | ICD-10-CM | POA: Diagnosis not present

## 2019-07-27 DIAGNOSIS — H40021 Open angle with borderline findings, high risk, right eye: Secondary | ICD-10-CM | POA: Diagnosis not present

## 2019-07-27 DIAGNOSIS — Z9889 Other specified postprocedural states: Secondary | ICD-10-CM | POA: Diagnosis not present

## 2019-07-27 DIAGNOSIS — H401122 Primary open-angle glaucoma, left eye, moderate stage: Secondary | ICD-10-CM | POA: Diagnosis not present

## 2019-07-27 DIAGNOSIS — E119 Type 2 diabetes mellitus without complications: Secondary | ICD-10-CM | POA: Diagnosis not present

## 2019-07-27 DIAGNOSIS — Z9883 Filtering (vitreous) bleb after glaucoma surgery status: Secondary | ICD-10-CM | POA: Diagnosis not present

## 2019-08-04 ENCOUNTER — Encounter: Payer: Self-pay | Admitting: Family Medicine

## 2019-08-04 ENCOUNTER — Other Ambulatory Visit: Payer: Self-pay

## 2019-08-04 ENCOUNTER — Ambulatory Visit (INDEPENDENT_AMBULATORY_CARE_PROVIDER_SITE_OTHER): Payer: Medicare Other | Admitting: Family Medicine

## 2019-08-04 VITALS — BP 140/70 | HR 69 | Wt 255.4 lb

## 2019-08-04 DIAGNOSIS — E119 Type 2 diabetes mellitus without complications: Secondary | ICD-10-CM | POA: Diagnosis not present

## 2019-08-04 DIAGNOSIS — I10 Essential (primary) hypertension: Secondary | ICD-10-CM

## 2019-08-04 DIAGNOSIS — E785 Hyperlipidemia, unspecified: Secondary | ICD-10-CM | POA: Diagnosis not present

## 2019-08-04 LAB — POCT GLYCOSYLATED HEMOGLOBIN (HGB A1C): HbA1c, POC (controlled diabetic range): 7.3 % — AB (ref 0.0–7.0)

## 2019-08-04 NOTE — Patient Instructions (Addendum)
Keep working on the weight.  Of course, it is the biggest thing you could do. Let me know if you need a referral to Dr. Onnie Graham for your shoulder. I'm OK with that A1C Blood pressure is good. Stay on all the same medicines.  I will call with the lab test results. Cholesterol There are two pneumonia vaccines.  You have had the pneumovax 23.  You still need the Prevnar 13.  Let me know if you get it at the pharmacy.   See me in three months.

## 2019-08-05 LAB — LIPID PANEL
Chol/HDL Ratio: 2.3 ratio (ref 0.0–4.4)
Cholesterol, Total: 101 mg/dL (ref 100–199)
HDL: 43 mg/dL (ref >39)
LDL Chol Calc (NIH): 28 mg/dL (ref 0–99)
Triglycerides: 186 mg/dL — ABNORMAL HIGH (ref 0–149)
VLDL Cholesterol Cal: 30 mg/dL (ref 5–40)

## 2019-08-07 ENCOUNTER — Encounter: Payer: Self-pay | Admitting: Family Medicine

## 2019-08-07 NOTE — Assessment & Plan Note (Addendum)
Primary prevention.  Needs statin due to DM  Lipid panel result is great.

## 2019-08-07 NOTE — Progress Notes (Signed)
   Subjective:    Patient ID: Nyala Cundiff, female    DOB: [birth year], 73 y.o.   MRN: [id number]  HPI For diabetes recheck and more. 1. DM Tolerating meds well.  A1C has gone from 7.4 to 7.3.  Given age, goal is less than 8.   2. Left shoulder pain.  "I know I need a shoulder replacement."  Had right shoulder replacement by dr. Onnie Graham.  Does not want to try any cortisone shots.  I'' just wait until Jan or FEb and see Dr. Onnie Graham. 3. Due for a lipid panel 4. Due for pneumonia vaccine. 5. Discussed future cOVID vaccination. 6. Morbid obesity.  BMI right at 40.  She would be morbidly obese at anything above 35 given her DM and HBP Got flu shot.  VS and wt noted.    Review of Systems     Objective:   Physical Exam  Lungs clear Cardiac RRR without m. Or g.           Assessment & Plan:

## 2019-08-07 NOTE — Assessment & Plan Note (Signed)
At goal A1C.  Strongly encourage wt loss.

## 2019-08-07 NOTE — Assessment & Plan Note (Signed)
Well controled on current meds 

## 2019-09-14 DIAGNOSIS — H353221 Exudative age-related macular degeneration, left eye, with active choroidal neovascularization: Secondary | ICD-10-CM | POA: Diagnosis not present

## 2019-09-14 DIAGNOSIS — H35722 Serous detachment of retinal pigment epithelium, left eye: Secondary | ICD-10-CM | POA: Diagnosis not present

## 2019-09-14 DIAGNOSIS — H40031 Anatomical narrow angle, right eye: Secondary | ICD-10-CM | POA: Diagnosis not present

## 2019-09-14 DIAGNOSIS — H35371 Puckering of macula, right eye: Secondary | ICD-10-CM | POA: Diagnosis not present

## 2019-09-15 ENCOUNTER — Other Ambulatory Visit: Payer: Self-pay

## 2019-09-15 MED ORDER — ONETOUCH VERIO VI STRP: ORAL_STRIP | 12 refills | Status: DC

## 2019-09-15 MED ORDER — ONETOUCH DELICA LANCING DEV MISC: 0 refills | Status: AC

## 2019-09-15 MED ORDER — ONETOUCH DELICA PLUS LANCET33G MISC: 1.0000 | Freq: Two times a day (BID) | 12 refills | Status: DC

## 2019-09-15 MED ORDER — ONETOUCH VERIO W/DEVICE KIT: PACK | 0 refills | Status: DC

## 2019-09-20 ENCOUNTER — Other Ambulatory Visit: Payer: Self-pay | Admitting: *Deleted

## 2019-09-20 MED ORDER — VICTOZA 18 MG/3ML ~~LOC~~ SOPN: PEN_INJECTOR | SUBCUTANEOUS | 11 refills | Status: DC

## 2019-09-30 ENCOUNTER — Encounter: Payer: Self-pay | Admitting: Family Medicine

## 2019-09-30 ENCOUNTER — Other Ambulatory Visit: Payer: Self-pay

## 2019-09-30 ENCOUNTER — Ambulatory Visit (INDEPENDENT_AMBULATORY_CARE_PROVIDER_SITE_OTHER): Payer: Medicare Other | Admitting: Family Medicine

## 2019-09-30 VITALS — BP 132/60 | HR 76 | Wt 255.0 lb

## 2019-09-30 DIAGNOSIS — R197 Diarrhea, unspecified: Secondary | ICD-10-CM | POA: Diagnosis not present

## 2019-09-30 DIAGNOSIS — Z23 Encounter for immunization: Secondary | ICD-10-CM

## 2019-09-30 NOTE — Assessment & Plan Note (Signed)
Patient is continuing to have chronic diarrhea symptoms. We discussed decreasing use of pepto-bismol and using imodium to treat diarrhea. Given black stools in the setting of pepto-bismol and normal colonoscopy hx, a CBC will be obtained to screen for anemia. Lastly, patient agreed to two week trial of discontinuing metformin to see if medication contributes to diarrhea. If diarrhea improves following two weeks, we will begin regimen of 500 mg metformin BID. If symptoms unchanged continue current metformin regimen, 1000 mg BID, and follow up in March 2021.

## 2019-09-30 NOTE — Patient Instructions (Addendum)
Thank you for coming to visit [place] today!  To help with your diarrhea we discussed a trial of stopping your metformin for 2 weeks to see if your symptoms resolve.   If your symptoms do resolve we will begin a smaller dose of metformin: take 500 mg (or 1 tablet) two times a day  If your symptoms do not resolve we will recontinue your metformin at the original dose: 1000 mg (or two tablets) twice a day and then  discuss next options during your upcoming visit in March.  In addition, Instead of pepto bismol try using the over the counter medication Imodium   Please contact us or make an earlier appointment if your symptoms continue to persist.

## 2019-09-30 NOTE — Progress Notes (Signed)
   CHIEF COMPLAINT / HPI: Ongoing diarrhea   Diarrhea:  - Patient was seen for similar complaint in July 2020 with Dr. Lindell Noe  - Discusses ongoing diarrhea, described as "black runny stools", that occur with each bowel movement. She further includes that these symptoms are unpredictable with no noticeable trigger and is severely affecting her quality of life - She takes pepto-bismol 3 times a week to help alleviate her symptoms of GI discomfort. Recently, within the past month, she noticed that the Pepto-bismol no longer helps stop her discomfort  - Patient notes she takes 1000 mg of metformin BID and liraglutide for her diabetes. Notes symptoms began following she started her liraglutide. She eats 2-3 well balanced meals per day - She denies recent travel history, sick contacts, or recent use of antibiotics - On review of systems, she denies fever, chills, dizziness, or changes in urinary habits  PERTINENT  PMH / PSH: T2DM, hypothyroidism    OBJECTIVE: BP 132/60   Pulse 76   Wt 255 lb (115.7 kg)   SpO2 99%   BMI 39.94 kg/m   Physical Exam  Constitutional: She is oriented to person, place, and time and well-developed, well-nourished, and in no distress. No distress.  Cardiovascular: Normal rate, regular rhythm and normal heart sounds. Exam reveals no gallop and no friction rub.  No murmur heard. Pulmonary/Chest: Effort normal and breath sounds normal. She has no wheezes. She has no rales.  Abdominal: Soft. Bowel sounds are normal. She exhibits no distension. There is no abdominal tenderness. There is no rebound.  Neurological: She is alert and oriented to person, place, and time.    ASSESSMENT / PLAN:  Diarrhea Patient is continuing to have chronic diarrhea symptoms. We discussed decreasing use of pepto-bismol and using imodium to treat diarrhea. Given black stools in the setting of pepto-bismol and normal colonoscopy hx, a CBC will be obtained to screen for anemia. Lastly,  patient agreed to two week trial of discontinuing metformin to see if medication contributes to diarrhea. If diarrhea improves following two weeks, we will begin regimen of 500 mg metformin BID. If symptoms unchanged continue current metformin regimen, 1000 mg BID, and follow up in March 2021.      [place]  I was physically present and/or repeated all elements of the H&PE.  I agree with the documentation and management of MS3 Skarlet Lyons.  Briefly, Ms Frances has longstanding diarrhea without weight loss, bleeding, or fever.  Only cramping pain.  This is a chronic, non progressive problem.  She is up to date on her colon cancer screen.  Because she has no red flag symptoms, I believe it is safe to minimize the WU (get CBC to check for blood loss) and focus on treatment.  Will start by exploring if med induced.  From a probability standpoint, metformin is the most likely culprit.

## 2019-10-01 ENCOUNTER — Encounter: Payer: Self-pay | Admitting: Family Medicine

## 2019-10-01 LAB — CBC
Hematocrit: 40.9 % (ref 34.0–46.6)
Hemoglobin: 13.1 g/dL (ref 11.1–15.9)
MCH: 26 pg — ABNORMAL LOW (ref 26.6–33.0)
MCHC: 32 g/dL (ref 31.5–35.7)
MCV: 81 fL (ref 79–97)
Platelets: 304 10*3/uL (ref 150–450)
RBC: 5.03 x10E6/uL (ref 3.77–5.28)
RDW: 14.9 % (ref 11.7–15.4)
WBC: 8.6 10*3/uL (ref 3.4–10.8)

## 2019-10-06 DIAGNOSIS — Z96611 Presence of right artificial shoulder joint: Secondary | ICD-10-CM | POA: Diagnosis not present

## 2019-10-06 DIAGNOSIS — M25512 Pain in left shoulder: Secondary | ICD-10-CM | POA: Diagnosis not present

## 2019-10-06 DIAGNOSIS — Z471 Aftercare following joint replacement surgery: Secondary | ICD-10-CM | POA: Diagnosis not present

## 2019-10-17 ENCOUNTER — Ambulatory Visit: Payer: Medicare Other | Attending: Internal Medicine

## 2019-10-17 NOTE — Progress Notes (Signed)
   Covid-19 Vaccination Clinic  Name:  Kaitlyn Ferguson    MRN: [id number] DOB: Nov 24, 1945  10/17/2019  Kaitlyn Ferguson was observed post Covid-19 immunization for 15 minutes without incidence. She was provided with Vaccine Information Sheet and instruction to access the V-Safe system.   Kaitlyn Ferguson was instructed to call 911 with any severe reactions post vaccine: Marland Kitchen Difficulty breathing  . Swelling of your face and throat  . A fast heartbeat  . A bad rash all over your body  . Dizziness and weakness

## 2019-10-18 ENCOUNTER — Telehealth: Payer: Self-pay

## 2019-10-18 NOTE — Telephone Encounter (Signed)
Patient calls nurse line stating her GI symptoms have resolved since stopping metformin. Per PCP note, if GI symptoms improve, only take 500mg  BID. Patient stated she is willing to try this and has a FU on 3/17 with PCP.

## 2019-10-26 NOTE — Patient Instructions (Signed)
DUE TO COVID-19 ONLY ONE VISITOR IS ALLOWED TO COME WITH YOU AND STAY IN THE WAITING ROOM ONLY DURING PRE OP AND PROCEDURE DAY OF SURGERY. THE 1 VISITOR MAY VISIT WITH YOU AFTER SURGERY IN YOUR PRIVATE ROOM DURING VISITING HOURS ONLY!  YOU NEED TO HAVE A COVID 19 TEST ON_3/8______ @_9 :25______, THIS TEST MUST BE DONE BEFORE SURGERY, COME  [name] , [id number].  ([place]) ONCE YOUR COVID TEST IS COMPLETED, PLEASE BEGIN THE QUARANTINE INSTRUCTIONS AS OUTLINED IN YOUR HANDOUT.                Kaitlyn Ferguson    Your procedure is scheduled on: 11/04/19   Report to [facility]  Entrance   Report to admitting at 10:00 AM     Call this number if you have problems the morning of surgery [place], NO CHEWING GUM [place].  Do not eat food After Midnight.   YOU MAY HAVE CLEAR LIQUIDS FROM MIDNIGHT UNTIL 9:30 AM.    CLEAR LIQUID DIET   Foods Allowed                                                                     Foods Excluded  Coffee and tea, regular and decaf                             liquids that you cannot  Plain Jell-O any favor except red or purple                                           see through such as: Fruit ices (not with fruit pulp)                                     milk, soups, orange juice  Iced Popsicles                                    All solid food Carbonated beverages, regular and diet                                    Cranberry, grape and apple juices Sports drinks like Gatorade Lightly seasoned clear broth or consume(fat free) Sugar, honey syrup       At 9:30  AM Please finish the prescribed Pre-Surgery Gatorade drink    . Nothing by mouth after you finish the Gatorade drink !    Take these medicines the morning of surgery with A SIP OF WATER: Amlodipine, Levothyroxine, prilosec  DO NOT TAKE ANY DIABETIC  MEDICATIONS DAY OF YOUR SURGERY     How to Manage Your Diabetes Before and After Surgery  Why is it important to control my blood sugar before and after surgery? Marland Kitchen  Improving blood sugar levels before and after surgery helps healing and can limit problems. . A way of improving blood sugar control is eating a healthy diet by: o  Eating less sugar and carbohydrates o  Increasing activity/exercise o  Talking with your doctor about reaching your blood sugar goals . High blood sugars (greater than 180 mg/dL) can raise your risk of infections and slow your recovery, so you will need to focus on controlling your diabetes during the weeks before surgery. . Make sure that the doctor who takes care of your diabetes knows about your planned surgery including the date and location.  How do I manage my blood sugar before surgery? . Check your blood sugar at least 4 times a day, starting 2 days before surgery, to make sure that the level is not too high or low. o Check your blood sugar the morning of your surgery when you wake up and every 2 hours until you get to the Short Stay unit. . If your blood sugar is less than 70 mg/dL, you will need to treat for low blood sugar: o Do not take insulin. o Treat a low blood sugar (less than 70 mg/dL) with  cup of clear juice (cranberry or apple), 4 glucose tablets, OR glucose gel. o Recheck blood sugar in 15 minutes after treatment (to make sure it is greater than 70 mg/dL). If your blood sugar is not greater than 70 mg/dL on recheck, call [phone] for further instructions. . Report your blood sugar to the short stay nurse when you get to Short Stay.  . If you are admitted to the hospital after surgery: o Your blood sugar will be checked by the staff and you will probably be given insulin after surgery (instead of oral diabetes medicines) to make sure you have good blood sugar levels. o The goal for blood sugar control after surgery is 80-180 mg/dL.   WHAT  DO I DO ABOUT MY DIABETES MEDICATION?  Marland Kitchen Do not take oral diabetes medicines (pills) the morning of surgery.  . THE NIGHT BEFORE SURGERY, take 0    units of    insulin.       . THE MORNING OF SURGERY, take0   units of    insulin.  . The day of surgery, do not take other diabetes injectables, including Byetta (exenatide), Bydureon (exenatide ER), Victoza (liraglutide), or Trulicity (dulaglutide).  . If your CBG is greater than 220 mg/dL, you may take  of your sliding scale  . (correction) dose of insulin.                 You may not have any metal on your body including hair pins and              piercings  Do not wear jewelry, make-up, lotions, powders or perfumes, deodorant             Do not wear nail polish on your fingernails.  Do not shave  48 hours prior to surgery.     Do not bring valuables to the hospital. [place].  Contacts, dentures or bridgework may not be worn into surgery.      Patients discharged the day of surgery will not be allowed to drive home.  IF YOU ARE HAVING SURGERY AND GOING HOME THE SAME DAY, YOU MUST HAVE AN ADULT TO [place]  AND BE WITH YOU FOR 24 HOURS.  YOU MAY GO HOME BY TAXI OR UBER OR ORTHERWISE, BUT AN ADULT MUST ACCOMPANY YOU HOME AND STAY WITH YOU FOR 24 HOURS.  Name and phone number of your driver:  Special Instructions: N/A              Please read over the following fact sheets you were given: _____________________________________________________________________             [facility] - Preparing for Surgery  Before surgery, you can play an important role.   Because skin is not sterile, your skin needs to be as free of germs as possible .  You can reduce the number of germs on your skin by washing with CHG (chlorahexidine gluconate) soap before surgery.   CHG is an antiseptic cleaner which kills germs and bonds with the skin to continue killing germs even after  washing. Please DO NOT use if you have an allergy to CHG or antibacterial soaps .  If your skin becomes reddened/irritated stop using the CHG and inform your nurse when you arrive at Short Stay. Do not shave (including legs and underarms) for at least 48 hours prior to the first CHG shower.  Please follow these instructions carefully:  1.  Shower with CHG Soap the night before surgery and the  morning of Surgery.  2.  If you choose to wash your hair, wash your hair first as usual with your  normal  shampoo.  3.  After you shampoo, rinse your hair and body thoroughly to remove the  shampoo.                                        4.  Use CHG as you would any other liquid soap.  You can apply chg directly  to the skin and wash                       Gently with a scrungie or clean washcloth.  5.  Apply the CHG Soap to your body ONLY FROM THE NECK DOWN.   Do not use on face/ open                           Wound or open sores. Avoid contact with eyes, ears mouth and genitals (private parts).                       Wash face,  Genitals (private parts) with your normal soap.             6.  Wash thoroughly, paying special attention to the area where your surgery  will be performed.  7.  Thoroughly rinse your body with warm water from the neck down.  8.  DO NOT shower/wash with your normal soap after using and rinsing off  the CHG Soap.             9.  Pat yourself dry with a clean towel.            10.  Wear clean pajamas.            11.  Place clean sheets on your bed the night of your first shower and do not  sleep with pets. Day of Surgery : Do not apply any lotions/deodorants the morning  of surgery.  Please wear clean clothes to the hospital/surgery center.   [facility]- Preparing for Total Shoulder Arthroplasty    Before surgery, you can play an important role. Because skin is not sterile, your skin needs to be as free of germs as possible. You can reduce the number of germs on your skin by  using the following products. . Benzoyl Peroxide Gel o Reduces the number of germs present on the skin o Applied twice a day to shoulder area starting two days before surgery    ==================================================================  Please follow these instructions carefully:  BENZOYL PEROXIDE 5% GEL  Please do not use if you have an allergy to benzoyl peroxide.   If your skin becomes reddened/irritated stop using the benzoyl peroxide.  Starting two days before surgery, apply as follows: 1. Apply benzoyl peroxide in the morning and at night. Apply after taking a shower. If you are not taking a shower clean entire shoulder front, back, and side along with the armpit with a clean wet washcloth.  2. Place a quarter-sized dollop on your shoulder and rub in thoroughly, making sure to cover the front, back, and side of your shoulder, along with the armpit.   2 days before ____ AM   ____ PM              1 day before ____ AM   ____ PM                         3. Do this twice a day for two days.  (Last application is the night before surgery, AFTER using the CHG soap as described below).  4. Do NOT apply benzoyl peroxide gel on the day of surgery.  FAILURE TO FOLLOW THESE INSTRUCTIONS MAY RESULT IN THE CANCELLATION OF YOUR SURGERY PATIENT SIGNATURE_________________________________  NURSE SIGNATURE__________________________________  ___  Incentive Spirometer  An incentive spirometer is a tool that can help keep your lungs clear and active. This tool measures how well you are filling your lungs with each breath. Taking long deep breaths may help reverse or decrease the chance of developing breathing (pulmonary) problems (especially infection) following:  A long period of time when you are unable to move or be active. BEFORE THE PROCEDURE   If the spirometer includes an indicator to show your best effort, your nurse or respiratory therapist will set it to a desired goal.  If  possible, sit up straight or lean slightly forward. Try not to slouch.  Hold the incentive spirometer in an upright position. INSTRUCTIONS FOR USE  1. Sit on the edge of your bed if possible, or sit up as far as you can in bed or on a chair. 2. Hold the incentive spirometer in an upright position. 3. Breathe out normally. 4. Place the mouthpiece in your mouth and seal your lips tightly around it. 5. Breathe in slowly and as deeply as possible, raising the piston or the ball toward the top of the column. 6. Hold your breath for 3-5 seconds or for as long as possible. Allow the piston or ball to fall to the bottom of the column. 7. Remove the mouthpiece from your mouth and breathe out normally. 8. Rest for a few seconds and repeat Steps 1 through 7 at least 10 times every 1-2 hours when you are awake. Take your time and take a few normal breaths between deep breaths. 9. The spirometer may include an indicator to show your best effort.  Use the indicator as a goal to work toward during each repetition. 10. After each set of 10 deep breaths, practice coughing to be sure your lungs are clear. If you have an incision (the cut made at the time of surgery), support your incision when coughing by placing a pillow or rolled up towels firmly against it. Once you are able to get out of bed, walk around indoors and cough well. You may stop using the incentive spirometer when instructed by your caregiver.  RISKS AND COMPLICATIONS  Take your time so you do not get dizzy or light-headed.  If you are in pain, you may need to take or ask for pain medication before doing incentive spirometry. It is harder to take a deep breath if you are having pain. AFTER USE  Rest and breathe slowly and easily.  It can be helpful to keep track of a log of your progress. Your caregiver can provide you with a simple table to help with this. If you are using the spirometer at home, follow these instructions: [place]  IF:   You are having difficultly using the spirometer.  You have trouble using the spirometer as often as instructed.  Your pain medication is not giving enough relief while using the spirometer.  You develop fever of 100.5 F (38.1 C) or higher. SEEK IMMEDIATE MEDICAL CARE IF:   You cough up bloody sputum that had not been present before.  You develop fever of 102 F (38.9 C) or greater.  You develop worsening pain at or near the incision site. MAKE SURE YOU:   Understand these instructions.  Will watch your condition.  Will get help right away if you are not doing well or get worse. Document Released: 12/23/2006 Document Revised: 11/04/2011 Document Reviewed: 02/23/2007 [facility] Patient Information 2014 [employer], [place].   ________________________________________________________________________ _____________________________________________________________________

## 2019-10-27 ENCOUNTER — Encounter (HOSPITAL_COMMUNITY)
Admission: RE | Admit: 2019-10-27 | Discharge: 2019-10-27 | Disposition: A | Discharge status: Home / Self Care | Payer: Medicare Other | Source: Ambulatory Visit | Attending: Orthopedic Surgery | Admitting: Orthopedic Surgery

## 2019-10-27 ENCOUNTER — Other Ambulatory Visit: Payer: Self-pay

## 2019-10-27 ENCOUNTER — Encounter (HOSPITAL_COMMUNITY): Payer: Self-pay

## 2019-10-27 DIAGNOSIS — E789 Disorder of lipoprotein metabolism, unspecified: Secondary | ICD-10-CM | POA: Insufficient documentation

## 2019-10-27 DIAGNOSIS — Z96611 Presence of right artificial shoulder joint: Secondary | ICD-10-CM | POA: Diagnosis not present

## 2019-10-27 DIAGNOSIS — Z794 Long term (current) use of insulin: Secondary | ICD-10-CM | POA: Insufficient documentation

## 2019-10-27 DIAGNOSIS — Z6841 Body Mass Index (BMI) 40.0 and over, adult: Secondary | ICD-10-CM | POA: Diagnosis not present

## 2019-10-27 DIAGNOSIS — I10 Essential (primary) hypertension: Secondary | ICD-10-CM | POA: Insufficient documentation

## 2019-10-27 DIAGNOSIS — E669 Obesity, unspecified: Secondary | ICD-10-CM | POA: Insufficient documentation

## 2019-10-27 DIAGNOSIS — Z79899 Other long term (current) drug therapy: Secondary | ICD-10-CM | POA: Insufficient documentation

## 2019-10-27 DIAGNOSIS — Z7982 Long term (current) use of aspirin: Secondary | ICD-10-CM | POA: Diagnosis not present

## 2019-10-27 DIAGNOSIS — E119 Type 2 diabetes mellitus without complications: Secondary | ICD-10-CM | POA: Insufficient documentation

## 2019-10-27 DIAGNOSIS — R9431 Abnormal electrocardiogram [ECG] [EKG]: Secondary | ICD-10-CM | POA: Diagnosis not present

## 2019-10-27 DIAGNOSIS — E039 Hypothyroidism, unspecified: Secondary | ICD-10-CM | POA: Diagnosis not present

## 2019-10-27 DIAGNOSIS — Z96652 Presence of left artificial knee joint: Secondary | ICD-10-CM | POA: Diagnosis not present

## 2019-10-27 DIAGNOSIS — M19012 Primary osteoarthritis, left shoulder: Secondary | ICD-10-CM | POA: Insufficient documentation

## 2019-10-27 DIAGNOSIS — Z79890 Hormone replacement therapy (postmenopausal): Secondary | ICD-10-CM | POA: Insufficient documentation

## 2019-10-27 DIAGNOSIS — Z01818 Encounter for other preprocedural examination: Secondary | ICD-10-CM | POA: Insufficient documentation

## 2019-10-27 LAB — CBC
HCT: 40.6 % (ref 36.0–46.0)
Hemoglobin: 12.4 g/dL (ref 12.0–15.0)
MCH: 25.4 pg — ABNORMAL LOW (ref 26.0–34.0)
MCHC: 30.5 g/dL (ref 30.0–36.0)
MCV: 83.2 fL (ref 80.0–100.0)
Platelets: 319 10*3/uL (ref 150–400)
RBC: 4.88 MIL/uL (ref 3.87–5.11)
RDW: 15.6 % — ABNORMAL HIGH (ref 11.5–15.5)
WBC: 8.2 10*3/uL (ref 4.0–10.5)
nRBC: 0 % (ref 0.0–0.2)

## 2019-10-27 LAB — BASIC METABOLIC PANEL
Anion gap: 9 (ref 5–15)
BUN: 14 mg/dL (ref 8–23)
CO2: 30 mmol/L (ref 22–32)
Calcium: 9.2 mg/dL (ref 8.9–10.3)
Chloride: 101 mmol/L (ref 98–111)
Creatinine, Ser: 0.82 mg/dL (ref 0.44–1.00)
GFR calc Af Amer: >60 mL/min (ref >60)
GFR calc non Af Amer: >60 mL/min (ref >60)
Glucose, Bld: 126 mg/dL — ABNORMAL HIGH (ref 70–99)
Potassium: 3.8 mmol/L (ref 3.5–5.1)
Sodium: 140 mmol/L (ref 135–145)

## 2019-10-27 LAB — HEMOGLOBIN A1C
Hgb A1c MFr Bld: 8.1 % — ABNORMAL HIGH (ref 4.8–5.6)
Mean Plasma Glucose: 185.77 mg/dL

## 2019-10-27 LAB — GLUCOSE, CAPILLARY: Glucose-Capillary: 134 mg/dL — ABNORMAL HIGH (ref 70–99)

## 2019-10-27 LAB — SURGICAL PCR SCREEN
MRSA, PCR: NEGATIVE
Staphylococcus aureus: NEGATIVE

## 2019-10-27 NOTE — Progress Notes (Signed)
PCP - Dr. Sharee Pimple Cardiologist - no  Chest x-ray - no EKG - 10/27/19 Stress Test - no ECHO - no Cardiac Cath - no  Sleep Study - no CPAP -   Fasting Blood Sugar - 150-222 Checks Blood Sugar _BID____ times a day  Blood Thinner Instructions:ASA  Aspirin Instructions:supple said stop 7 days prior to DOS Last Dose:10/27/19  Anesthesia review:   Patient denies shortness of breath, fever, cough and chest pain at PAT appointment yes  Patient verbalized understanding of instructions that were given to them at the PAT appointment. Patient was also instructed that they will need to review over the PAT instructions again at home before surgery. yes

## 2019-10-28 NOTE — Progress Notes (Signed)
Anesthesia Chart Review   Case: [id number] Date/Time: 11/04/19 1213   Procedure: TOTAL SHOULDER ARTHROPLASTY (Left Shoulder) - 133mn   Anesthesia type: General   Pre-op diagnosis: Left shoulder osteoarthritis   [facility] 06 / [facility]   Surgeons: SJustice Britain MD      DISCUSSION:73 y.o. never smoker with h/o DM II, hypothyroidism, HTN, left shoulder OA scheduled for above procedure 11/04/19 with Dr. KJustice Britain   A1C 8.1 at PAT visit 10/27/19.  Surgeon made aware.   VS: There were no vitals taken for this visit.  PROVIDERS: HZenia Resides MD is PCP    LABS: Surgeon made aware of A1C (all labs ordered are listed, but only abnormal results are displayed)  Labs Reviewed - No data to display   IMAGES:   EKG: 10/27/19 Rate 66 bpm  Normal sinus rhythm Septal infarct , age undetermined Abnormal ECG No significant change since last tracing  CV:  Past Medical History:  Diagnosis Date  . Arthritis   . Diabetes mellitus   . DJD (degenerative joint disease) 10/07/2008  . DJD (degenerative joint disease)   . Family history of adverse reaction to anesthesia    MOTHER WAS DIFFICULT TO WAKE UP  . Hyperlipidemia   . Hypertension   . Hypothyroidism   . Nausea    INTERMITANT - TAKES PRILOSEC (DENIES REFLUX)  . Obesity   . Osteomyelitis of ankle (HCC)    Complications of Right ankle injury    Past Surgical History:  Procedure Laterality Date  . ABDOMINAL HYSTERECTOMY    . ANKLE FUSION  02/1996   Right ankle tib/talar  . BACK SURGERY  1984  . BREAST EXCISIONAL BIOPSY Right   . CARPAL TUNNEL RELEASE  1993   Right arm  . CHOLECYSTECTOMY    . EYE SURGERY Left   . NECK MASS EXCISION  12/29/2001  . right eye laser    . TONSILLECTOMY     Childhood  . TOTAL KNEE ARTHROPLASTY Left 12/21/2015   Procedure: LEFT TOTAL KNEE ARTHROPLASTY WITH COMPLEX COMPUTER NAVIGATION ;  Surgeon: BRod Can MD;  [facility];  Service: Orthopedics;  Laterality: Left;  . TOTAL  SHOULDER ARTHROPLASTY Right 07/11/2016   Procedure: RIGHT TOTAL SHOULDER ARTHROPLASTY;  Surgeon: KJustice Britain MD;  [place] [facility]  Service: Orthopedics;  Laterality: Right;    MEDICATIONS: . amLODipine (NORVASC) 5 MG tablet  . aspirin EC 81 MG tablet  . Blood Glucose Monitoring Suppl ([place]FLEX SYSTEM) w/Device KIT  . Blood Glucose Monitoring Suppl (ONETOUCH VERIO) w/Device KIT  . cholecalciferol (VITAMIN D3) 25 MCG (1000 UNIT) tablet  . glucose blood (ONETOUCH VERIO) test strip  . ibuprofen (ADVIL,MOTRIN) 200 MG tablet  . insulin degludec (TRESIBA FLEXTOUCH) 100 UNIT/ML SOPN FlexTouch Pen  . Insulin Pen Needle (B-D UF III MINI PEN NEEDLES) 31G X 5 MM MISC  . Lancet Devices (ONE TOUCH DELICA LANCING DEV) MISC  . Lancets (FREESTYLE) lancets  . Lancets (ONETOUCH DELICA PLUS [id number] MISC  . levothyroxine (SYNTHROID) 88 MCG tablet  . liraglutide (VICTOZA) 18 MG/3ML SOPN  . lisinopril-hydrochlorothiazide (ZESTORETIC) 20-12.5 MG tablet  . metFORMIN (GLUCOPHAGE) 500 MG tablet  . Multiple Vitamin (MULTIVITAMIN WITH MINERALS) TABS tablet  . omeprazole (PRILOSEC) 40 MG capsule  . rosuvastatin (CRESTOR) 20 MG tablet   No current facility-administered medications for this encounter.     [facility] Pre-Surgical Testing ([phone]/04/21  1:22 PM

## 2019-11-01 ENCOUNTER — Other Ambulatory Visit (HOSPITAL_COMMUNITY)
Admission: RE | Admit: 2019-11-01 | Discharge: 2019-11-01 | Disposition: A | Discharge status: Home / Self Care | Payer: Medicare Other | Source: Ambulatory Visit | Attending: Orthopedic Surgery | Admitting: Orthopedic Surgery

## 2019-11-01 ENCOUNTER — Other Ambulatory Visit: Payer: Self-pay | Admitting: Family Medicine

## 2019-11-01 DIAGNOSIS — Z01812 Encounter for preprocedural laboratory examination: Secondary | ICD-10-CM | POA: Diagnosis not present

## 2019-11-01 DIAGNOSIS — E119 Type 2 diabetes mellitus without complications: Secondary | ICD-10-CM

## 2019-11-01 DIAGNOSIS — Z20822 Contact with and (suspected) exposure to covid-19: Secondary | ICD-10-CM | POA: Diagnosis not present

## 2019-11-01 LAB — SARS CORONAVIRUS 2 (TAT 6-24 HRS): SARS Coronavirus 2: NEGATIVE

## 2019-11-01 NOTE — Telephone Encounter (Signed)
Called patient to verify the dose.  Because of stomach problems, we had decreased her metformin to 500 mg bid.  She has not had any stomach problems, but her A1C has risen to 8.1.  We decided to try 750 mg bid

## 2019-11-04 ENCOUNTER — Encounter (HOSPITAL_COMMUNITY): Payer: Self-pay | Admitting: Orthopedic Surgery

## 2019-11-04 ENCOUNTER — Ambulatory Visit (HOSPITAL_COMMUNITY): Payer: Medicare Other | Admitting: Certified Registered Nurse Anesthetist

## 2019-11-04 ENCOUNTER — Ambulatory Visit (HOSPITAL_COMMUNITY): Payer: Medicare Other | Admitting: Physician Assistant

## 2019-11-04 ENCOUNTER — Encounter (HOSPITAL_COMMUNITY)
Admission: RE | Disposition: A | Discharge status: Home / Self Care | Payer: Self-pay | Source: Home / Self Care | Attending: Orthopedic Surgery

## 2019-11-04 ENCOUNTER — Other Ambulatory Visit: Payer: Self-pay

## 2019-11-04 ENCOUNTER — Ambulatory Visit (HOSPITAL_COMMUNITY)
Admission: RE | Admit: 2019-11-04 | Discharge: 2019-11-05 | Disposition: A | Discharge status: Home / Self Care | Payer: Medicare Other | Attending: Orthopedic Surgery | Admitting: Orthopedic Surgery

## 2019-11-04 DIAGNOSIS — Z6839 Body mass index (BMI) 39.0-39.9, adult: Secondary | ICD-10-CM | POA: Insufficient documentation

## 2019-11-04 DIAGNOSIS — E039 Hypothyroidism, unspecified: Secondary | ICD-10-CM | POA: Insufficient documentation

## 2019-11-04 DIAGNOSIS — Z8249 Family history of ischemic heart disease and other diseases of the circulatory system: Secondary | ICD-10-CM | POA: Insufficient documentation

## 2019-11-04 DIAGNOSIS — Z7982 Long term (current) use of aspirin: Secondary | ICD-10-CM | POA: Diagnosis not present

## 2019-11-04 DIAGNOSIS — Z888 Allergy status to other drugs, medicaments and biological substances status: Secondary | ICD-10-CM | POA: Insufficient documentation

## 2019-11-04 DIAGNOSIS — M19012 Primary osteoarthritis, left shoulder: Secondary | ICD-10-CM | POA: Diagnosis not present

## 2019-11-04 DIAGNOSIS — Z981 Arthrodesis status: Secondary | ICD-10-CM | POA: Diagnosis not present

## 2019-11-04 DIAGNOSIS — E119 Type 2 diabetes mellitus without complications: Secondary | ICD-10-CM | POA: Diagnosis not present

## 2019-11-04 DIAGNOSIS — Z794 Long term (current) use of insulin: Secondary | ICD-10-CM | POA: Insufficient documentation

## 2019-11-04 DIAGNOSIS — Z96612 Presence of left artificial shoulder joint: Secondary | ICD-10-CM

## 2019-11-04 DIAGNOSIS — Z96611 Presence of right artificial shoulder joint: Secondary | ICD-10-CM | POA: Diagnosis not present

## 2019-11-04 DIAGNOSIS — I10 Essential (primary) hypertension: Secondary | ICD-10-CM | POA: Diagnosis not present

## 2019-11-04 DIAGNOSIS — Z833 Family history of diabetes mellitus: Secondary | ICD-10-CM | POA: Diagnosis not present

## 2019-11-04 DIAGNOSIS — Z96652 Presence of left artificial knee joint: Secondary | ICD-10-CM | POA: Insufficient documentation

## 2019-11-04 DIAGNOSIS — Z79899 Other long term (current) drug therapy: Secondary | ICD-10-CM | POA: Insufficient documentation

## 2019-11-04 DIAGNOSIS — E785 Hyperlipidemia, unspecified: Secondary | ICD-10-CM | POA: Insufficient documentation

## 2019-11-04 DIAGNOSIS — G8918 Other acute postprocedural pain: Secondary | ICD-10-CM | POA: Diagnosis not present

## 2019-11-04 DIAGNOSIS — E669 Obesity, unspecified: Secondary | ICD-10-CM | POA: Insufficient documentation

## 2019-11-04 DIAGNOSIS — Z9889 Other specified postprocedural states: Secondary | ICD-10-CM | POA: Diagnosis present

## 2019-11-04 DIAGNOSIS — Z881 Allergy status to other antibiotic agents status: Secondary | ICD-10-CM | POA: Diagnosis not present

## 2019-11-04 DIAGNOSIS — M25712 Osteophyte, left shoulder: Secondary | ICD-10-CM | POA: Diagnosis not present

## 2019-11-04 DIAGNOSIS — Z79890 Hormone replacement therapy (postmenopausal): Secondary | ICD-10-CM | POA: Insufficient documentation

## 2019-11-04 HISTORY — PX: TOTAL SHOULDER ARTHROPLASTY: SHX126

## 2019-11-04 LAB — GLUCOSE, CAPILLARY
Glucose-Capillary: 160 mg/dL — ABNORMAL HIGH (ref 70–99)
Glucose-Capillary: 160 mg/dL — ABNORMAL HIGH (ref 70–99)
Glucose-Capillary: 228 mg/dL — ABNORMAL HIGH (ref 70–99)
Glucose-Capillary: 292 mg/dL — ABNORMAL HIGH (ref 70–99)

## 2019-11-04 SURGERY — ARTHROPLASTY, SHOULDER, TOTAL
Anesthesia: General | Site: Shoulder | Laterality: Left

## 2019-11-04 MED ORDER — LACTATED RINGERS IV SOLN: INTRAVENOUS | Status: DC

## 2019-11-04 MED ORDER — SUGAMMADEX SODIUM 200 MG/2ML IV SOLN
INTRAVENOUS | Status: DC | PRN
  Administered 2019-11-04: 400 mg via INTRAVENOUS

## 2019-11-04 MED ORDER — KETOROLAC TROMETHAMINE 15 MG/ML IJ SOLN
7.5000 mg | Freq: Four times a day (QID) | INTRAMUSCULAR | Status: DC
  Administered 2019-11-04 – 2019-11-05 (×3): 7.5 mg via INTRAVENOUS
  Filled 2019-11-04 (×3): qty 1

## 2019-11-04 MED ORDER — ONDANSETRON HCL 4 MG/2ML IJ SOLN: 4.0000 mg | Freq: Four times a day (QID) | INTRAMUSCULAR | Status: DC | PRN

## 2019-11-04 MED ORDER — ROCURONIUM BROMIDE 50 MG/5ML IV SOSY
PREFILLED_SYRINGE | INTRAVENOUS | Status: DC | PRN
  Administered 2019-11-04: 70 mg via INTRAVENOUS

## 2019-11-04 MED ORDER — OXYCODONE-ACETAMINOPHEN 5-325 MG PO TABS: 1.0000 | ORAL_TABLET | ORAL | 0 refills | Status: DC | PRN

## 2019-11-04 MED ORDER — ONDANSETRON HCL 4 MG/2ML IJ SOLN
INTRAMUSCULAR | Status: DC | PRN
  Administered 2019-11-04: 4 mg via INTRAVENOUS

## 2019-11-04 MED ORDER — ACETAMINOPHEN 10 MG/ML IV SOLN
INTRAVENOUS | Status: AC
  Filled 2019-11-04: qty 100

## 2019-11-04 MED ORDER — ONDANSETRON HCL 4 MG PO TABS: 4.0000 mg | ORAL_TABLET | Freq: Four times a day (QID) | ORAL | Status: DC | PRN

## 2019-11-04 MED ORDER — MENTHOL 3 MG MT LOZG: 1.0000 | LOZENGE | OROMUCOSAL | Status: DC | PRN

## 2019-11-04 MED ORDER — DOCUSATE SODIUM 100 MG PO CAPS
100.0000 mg | ORAL_CAPSULE | Freq: Two times a day (BID) | ORAL | Status: DC
  Administered 2019-11-04: 100 mg via ORAL
  Filled 2019-11-04 (×2): qty 1

## 2019-11-04 MED ORDER — DEXAMETHASONE SODIUM PHOSPHATE 10 MG/ML IJ SOLN
INTRAMUSCULAR | Status: AC
  Filled 2019-11-04: qty 1

## 2019-11-04 MED ORDER — BISACODYL 5 MG PO TBEC: 5.0000 mg | DELAYED_RELEASE_TABLET | Freq: Every day | ORAL | Status: DC | PRN

## 2019-11-04 MED ORDER — METOCLOPRAMIDE HCL 5 MG PO TABS: 5.0000 mg | ORAL_TABLET | Freq: Three times a day (TID) | ORAL | Status: DC | PRN

## 2019-11-04 MED ORDER — LIDOCAINE 2% (20 MG/ML) 5 ML SYRINGE
INTRAMUSCULAR | Status: AC
  Filled 2019-11-04: qty 5

## 2019-11-04 MED ORDER — FENTANYL CITRATE (PF) 100 MCG/2ML IJ SOLN
INTRAMUSCULAR | Status: AC
  Filled 2019-11-04: qty 2

## 2019-11-04 MED ORDER — METHOCARBAMOL 500 MG PO TABS
500.0000 mg | ORAL_TABLET | Freq: Four times a day (QID) | ORAL | Status: DC | PRN
  Administered 2019-11-04: 500 mg via ORAL
  Filled 2019-11-04: qty 1

## 2019-11-04 MED ORDER — 0.9 % SODIUM CHLORIDE (POUR BTL) OPTIME
TOPICAL | Status: DC | PRN
  Administered 2019-11-04: 1000 mL

## 2019-11-04 MED ORDER — PANTOPRAZOLE SODIUM 40 MG PO TBEC: 40.0000 mg | DELAYED_RELEASE_TABLET | Freq: Every day | ORAL | Status: DC

## 2019-11-04 MED ORDER — FLEET ENEMA 7-19 GM/118ML RE ENEM: 1.0000 | ENEMA | Freq: Once | RECTAL | Status: DC | PRN

## 2019-11-04 MED ORDER — POLYETHYLENE GLYCOL 3350 17 G PO PACK: 17.0000 g | PACK | Freq: Every day | ORAL | Status: DC | PRN

## 2019-11-04 MED ORDER — PHENOL 1.4 % MT LIQD: 1.0000 | OROMUCOSAL | Status: DC | PRN

## 2019-11-04 MED ORDER — LIDOCAINE 2% (20 MG/ML) 5 ML SYRINGE
INTRAMUSCULAR | Status: DC | PRN
  Administered 2019-11-04: 80 mg via INTRAVENOUS

## 2019-11-04 MED ORDER — OXYCODONE HCL 5 MG/5ML PO SOLN: 5.0000 mg | Freq: Once | ORAL | Status: DC | PRN

## 2019-11-04 MED ORDER — DIPHENHYDRAMINE HCL 12.5 MG/5ML PO ELIX: 12.5000 mg | ORAL_SOLUTION | ORAL | Status: DC | PRN

## 2019-11-04 MED ORDER — ONDANSETRON HCL 4 MG PO TABS: 4.0000 mg | ORAL_TABLET | Freq: Three times a day (TID) | ORAL | 0 refills | Status: DC | PRN

## 2019-11-04 MED ORDER — STERILE WATER FOR IRRIGATION IR SOLN
Status: DC | PRN
  Administered 2019-11-04: 2000 mL

## 2019-11-04 MED ORDER — DOCUSATE SODIUM 100 MG PO CAPS: 100.0000 mg | ORAL_CAPSULE | Freq: Two times a day (BID) | ORAL | Status: DC

## 2019-11-04 MED ORDER — ALUM & MAG HYDROXIDE-SIMETH 200-200-20 MG/5ML PO SUSP: 30.0000 mL | ORAL | Status: DC | PRN

## 2019-11-04 MED ORDER — ONDANSETRON HCL 4 MG/2ML IJ SOLN: 4.0000 mg | Freq: Once | INTRAMUSCULAR | Status: DC | PRN

## 2019-11-04 MED ORDER — OXYCODONE HCL 5 MG PO TABS: 5.0000 mg | ORAL_TABLET | Freq: Once | ORAL | Status: DC | PRN

## 2019-11-04 MED ORDER — METOCLOPRAMIDE HCL 5 MG/ML IJ SOLN: 5.0000 mg | Freq: Three times a day (TID) | INTRAMUSCULAR | Status: DC | PRN

## 2019-11-04 MED ORDER — TRANEXAMIC ACID-NACL 1000-0.7 MG/100ML-% IV SOLN
1000.0000 mg | INTRAVENOUS | Status: AC
  Administered 2019-11-04: 1000 mg via INTRAVENOUS
  Filled 2019-11-04: qty 100

## 2019-11-04 MED ORDER — MIDAZOLAM HCL 2 MG/2ML IJ SOLN
1.0000 mg | INTRAMUSCULAR | Status: DC
  Administered 2019-11-04: 1.5 mg via INTRAVENOUS
  Filled 2019-11-04: qty 2

## 2019-11-04 MED ORDER — PANTOPRAZOLE SODIUM 40 MG PO TBEC
40.0000 mg | DELAYED_RELEASE_TABLET | Freq: Every day | ORAL | Status: DC
  Administered 2019-11-05: 40 mg via ORAL
  Filled 2019-11-04 (×2): qty 1

## 2019-11-04 MED ORDER — CYCLOBENZAPRINE HCL 10 MG PO TABS: 10.0000 mg | ORAL_TABLET | Freq: Three times a day (TID) | ORAL | 1 refills | Status: DC | PRN

## 2019-11-04 MED ORDER — ROCURONIUM BROMIDE 10 MG/ML (PF) SYRINGE
PREFILLED_SYRINGE | INTRAVENOUS | Status: AC
  Filled 2019-11-04: qty 10

## 2019-11-04 MED ORDER — PROPOFOL 10 MG/ML IV BOLUS
INTRAVENOUS | Status: DC | PRN
  Administered 2019-11-04: 150 mg via INTRAVENOUS

## 2019-11-04 MED ORDER — OXYCODONE HCL 5 MG PO TABS: 5.0000 mg | ORAL_TABLET | ORAL | Status: DC | PRN

## 2019-11-04 MED ORDER — HYDROMORPHONE HCL 1 MG/ML IJ SOLN
INTRAMUSCULAR | Status: AC
  Filled 2019-11-04: qty 1

## 2019-11-04 MED ORDER — BUPIVACAINE LIPOSOME 1.3 % IJ SUSP
INTRAMUSCULAR | Status: DC | PRN
  Administered 2019-11-04: 10 mL via PERINEURAL

## 2019-11-04 MED ORDER — ACETAMINOPHEN 10 MG/ML IV SOLN
1000.0000 mg | Freq: Once | INTRAVENOUS | Status: AC
  Administered 2019-11-04: 1000 mg via INTRAVENOUS

## 2019-11-04 MED ORDER — FENTANYL CITRATE (PF) 100 MCG/2ML IJ SOLN
50.0000 ug | INTRAMUSCULAR | Status: DC
  Administered 2019-11-04: 50 ug via INTRAVENOUS
  Filled 2019-11-04: qty 2

## 2019-11-04 MED ORDER — HYDROMORPHONE HCL 1 MG/ML IJ SOLN: 0.5000 mg | INTRAMUSCULAR | Status: DC | PRN

## 2019-11-04 MED ORDER — NAPROXEN 500 MG PO TABS: 500.0000 mg | ORAL_TABLET | Freq: Two times a day (BID) | ORAL | 1 refills | Status: DC

## 2019-11-04 MED ORDER — DEXAMETHASONE SODIUM PHOSPHATE 10 MG/ML IJ SOLN
INTRAMUSCULAR | Status: DC | PRN
  Administered 2019-11-04: 6 mg via INTRAVENOUS

## 2019-11-04 MED ORDER — FENTANYL CITRATE (PF) 100 MCG/2ML IJ SOLN
INTRAMUSCULAR | Status: DC | PRN
  Administered 2019-11-04 (×4): 50 ug via INTRAVENOUS

## 2019-11-04 MED ORDER — ONDANSETRON HCL 4 MG/2ML IJ SOLN
INTRAMUSCULAR | Status: AC
  Filled 2019-11-04: qty 2

## 2019-11-04 MED ORDER — PHENYLEPHRINE HCL-NACL 10-0.9 MG/250ML-% IV SOLN
INTRAVENOUS | Status: DC | PRN
  Administered 2019-11-04: 50 ug/min via INTRAVENOUS

## 2019-11-04 MED ORDER — CHLORHEXIDINE GLUCONATE 4 % EX LIQD: 60.0000 mL | Freq: Once | CUTANEOUS | Status: DC

## 2019-11-04 MED ORDER — ACETAMINOPHEN 325 MG PO TABS: 325.0000 mg | ORAL_TABLET | Freq: Four times a day (QID) | ORAL | Status: DC | PRN

## 2019-11-04 MED ORDER — PROPOFOL 10 MG/ML IV BOLUS
INTRAVENOUS | Status: AC
  Filled 2019-11-04: qty 20

## 2019-11-04 MED ORDER — BUPIVACAINE HCL (PF) 0.5 % IJ SOLN
INTRAMUSCULAR | Status: DC | PRN
  Administered 2019-11-04: 15 mL via PERINEURAL

## 2019-11-04 MED ORDER — SUGAMMADEX SODIUM 500 MG/5ML IV SOLN
INTRAVENOUS | Status: AC
  Filled 2019-11-04: qty 5

## 2019-11-04 MED ORDER — METHOCARBAMOL 1000 MG/10ML IJ SOLN
500.0000 mg | Freq: Four times a day (QID) | INTRAVENOUS | Status: DC | PRN
  Filled 2019-11-04: qty 5

## 2019-11-04 MED ORDER — OXYCODONE HCL 5 MG PO TABS
10.0000 mg | ORAL_TABLET | ORAL | Status: DC | PRN
  Administered 2019-11-04 – 2019-11-05 (×2): 10 mg via ORAL
  Filled 2019-11-04 (×2): qty 2

## 2019-11-04 MED ORDER — FENTANYL CITRATE (PF) 100 MCG/2ML IJ SOLN
25.0000 ug | INTRAMUSCULAR | Status: DC | PRN
  Administered 2019-11-04 (×3): 50 ug via INTRAVENOUS

## 2019-11-04 MED ORDER — HYDROMORPHONE HCL 1 MG/ML IJ SOLN
0.2500 mg | INTRAMUSCULAR | Status: DC | PRN
  Administered 2019-11-04 (×3): 0.25 mg via INTRAVENOUS

## 2019-11-04 MED ORDER — CEFAZOLIN SODIUM-DEXTROSE 2-4 GM/100ML-% IV SOLN
2.0000 g | INTRAVENOUS | Status: AC
  Administered 2019-11-04: 2 g via INTRAVENOUS
  Filled 2019-11-04: qty 100

## 2019-11-04 SURGICAL SUPPLY — 65 items
ADH SKN CLS APL DERMABOND .7 (GAUZE/BANDAGES/DRESSINGS) ×1
AID PSTN UNV HD RSTRNT DISP (MISCELLANEOUS) ×1
BAG SPEC THK2 15X12 ZIP CLS (MISCELLANEOUS) ×1
BAG ZIPLOCK 12X15 (MISCELLANEOUS) ×2 IMPLANT
BIT DRILL 2.0X128 (BIT) ×2 IMPLANT
BLADE SAW SGTL 83.5X18.5 (BLADE) ×2 IMPLANT
CEMENT BONE DEPUY (Cement) ×2 IMPLANT
COOLER ICEMAN CLASSIC (MISCELLANEOUS) ×1 IMPLANT
COVER BACK TABLE 60X90IN (DRAPES) ×2 IMPLANT
COVER SURGICAL LIGHT HANDLE (MISCELLANEOUS) ×2 IMPLANT
COVER WAND RF STERILE (DRAPES) ×1 IMPLANT
DERMABOND ADVANCED (GAUZE/BANDAGES/DRESSINGS) ×1
DERMABOND ADVANCED .7 DNX12 (GAUZE/BANDAGES/DRESSINGS) ×1 IMPLANT
DRAPE ORTHO SPLIT 77X108 STRL (DRAPES) ×4
DRAPE SHEET LG 3/4 BI-LAMINATE (DRAPES) ×2 IMPLANT
DRAPE SURG 17X11 SM STRL (DRAPES) ×2 IMPLANT
DRAPE SURG ORHT 6 SPLT 77X108 (DRAPES) ×2 IMPLANT
DRAPE U-SHAPE 47X51 STRL (DRAPES) ×2 IMPLANT
DRESSING AQUACEL AG SP 3.5X10 (GAUZE/BANDAGES/DRESSINGS) IMPLANT
DRSG AQUACEL AG ADV 3.5X10 (GAUZE/BANDAGES/DRESSINGS) ×2 IMPLANT
DRSG AQUACEL AG SP 3.5X10 (GAUZE/BANDAGES/DRESSINGS) ×2
DURAPREP 26ML APPLICATOR (WOUND CARE) ×4 IMPLANT
ELECT BLADE TIP CTD 4 INCH (ELECTRODE) ×2 IMPLANT
ELECT REM PT RETURN 15FT ADLT (MISCELLANEOUS) ×2 IMPLANT
FACESHIELD WRAPAROUND (MASK) ×8 IMPLANT
FACESHIELD WRAPAROUND OR TEAM (MASK) ×4 IMPLANT
GLENOID WITH CLEAT MEDIUM (Shoulder) ×1 IMPLANT
GLOVE BIO SURGEON STRL SZ7.5 (GLOVE) ×2 IMPLANT
GLOVE BIO SURGEON STRL SZ8 (GLOVE) ×2 IMPLANT
GLOVE SS BIOGEL STRL SZ 7 (GLOVE) ×1 IMPLANT
GLOVE SS BIOGEL STRL SZ 7.5 (GLOVE) ×1 IMPLANT
GLOVE SUPERSENSE BIOGEL SZ 7 (GLOVE) ×1
GLOVE SUPERSENSE BIOGEL SZ 7.5 (GLOVE) ×1
GOWN STRL REUS W/TWL LRG LVL3 (GOWN DISPOSABLE) ×4 IMPLANT
HEAD HUMERAL UNIV 46/20 (Head) ×1 IMPLANT
KIT BASIN OR (CUSTOM PROCEDURE TRAY) ×2 IMPLANT
KIT TURNOVER KIT A (KITS) IMPLANT
MANIFOLD NEPTUNE II (INSTRUMENTS) ×2 IMPLANT
MARKER SKIN DUAL TIP RULER LAB (MISCELLANEOUS) ×2 IMPLANT
NDL TAPERED W/ NITINOL LOOP (MISCELLANEOUS) ×1 IMPLANT
NEEDLE TAPERED W/ NITINOL LOOP (MISCELLANEOUS) ×2 IMPLANT
NS IRRIG 1000ML POUR BTL (IV SOLUTION) ×2 IMPLANT
PACK SHOULDER (CUSTOM PROCEDURE TRAY) ×2 IMPLANT
PAD COLD SHLDR WRAP-ON (PAD) ×1 IMPLANT
PROTECTOR NERVE ULNAR (MISCELLANEOUS) ×2 IMPLANT
RESTRAINT HEAD UNIVERSAL NS (MISCELLANEOUS) ×2 IMPLANT
SLING ARM FOAM STRAP LRG (SOFTGOODS) ×2 IMPLANT
SLING ARM FOAM STRAP MED (SOFTGOODS) IMPLANT
SMARTMIX MINI TOWER (MISCELLANEOUS) ×2
SPONGE LAP 18X18 RF (DISPOSABLE) IMPLANT
STEM HUMERAL UNI APEX 10MM (Shoulder) ×1 IMPLANT
SUCTION FRAZIER HANDLE 12FR (TUBING) ×2
SUCTION TUBE FRAZIER 12FR DISP (TUBING) ×1 IMPLANT
SUT FIBERWIRE #2 38 T-5 BLUE (SUTURE)
SUT MNCRL AB 3-0 PS2 18 (SUTURE) ×2 IMPLANT
SUT MON AB 2-0 CT1 36 (SUTURE) ×2 IMPLANT
SUT VIC AB 1 CT1 36 (SUTURE) ×6 IMPLANT
SUTURE FIBERWR #2 38 T-5 BLUE (SUTURE) IMPLANT
SUTURE TAPE 1.3 40 TPR END (SUTURE) ×4 IMPLANT
SUTURETAPE 1.3 40 TPR END (SUTURE) ×8
TOWEL OR 17X26 10 PK STRL BLUE (TOWEL DISPOSABLE) ×2 IMPLANT
TOWEL OR NON WOVEN STRL DISP B (DISPOSABLE) ×2 IMPLANT
TOWER SMARTMIX MINI (MISCELLANEOUS) IMPLANT
WATER STERILE IRR 1000ML POUR (IV SOLUTION) ×4 IMPLANT
YANKAUER SUCT BULB TIP 10FT TU (MISCELLANEOUS) ×2 IMPLANT

## 2019-11-04 NOTE — Progress Notes (Addendum)
Patient does not want to use bedpan, states she will go to BR upon arrival to floor. Is afraid she will start pain again

## 2019-11-04 NOTE — Anesthesia Preprocedure Evaluation (Signed)
Anesthesia Evaluation  Patient identified by MRN, date of birth, ID band Patient awake    Reviewed: Allergy & Precautions, NPO status , Patient's Chart, lab work & pertinent test results  Airway Mallampati: II  TM Distance: >3 FB Neck ROM: Full    Dental no notable dental hx.    Pulmonary neg pulmonary ROS,    Pulmonary exam normal breath sounds clear to auscultation       Cardiovascular hypertension, Pt. on medications Normal cardiovascular exam Rhythm:Regular Rate:Normal     Neuro/Psych negative neurological ROS  negative psych ROS   GI/Hepatic negative GI ROS, Neg liver ROS,   Endo/Other  diabetes, Insulin DependentHypothyroidism   Renal/GU negative Renal ROS  negative genitourinary   Musculoskeletal negative musculoskeletal ROS (+)   Abdominal   Peds negative pediatric ROS (+)  Hematology negative hematology ROS (+)   Anesthesia Other Findings   Reproductive/Obstetrics negative OB ROS                             Anesthesia Physical Anesthesia Plan  ASA: III  Anesthesia Plan: General   Post-op Pain Management:  Regional for Post-op pain   Induction: Intravenous  PONV Risk Score and Plan: 3 and Ondansetron, Dexamethasone and Treatment may vary due to age or medical condition  Airway Management Planned: Oral ETT  Additional Equipment:   Intra-op Plan:   Post-operative Plan: Extubation in OR  Informed Consent: I have reviewed the patients History and Physical, chart, labs and discussed the procedure including the risks, benefits and alternatives for the proposed anesthesia with the patient or authorized representative who has indicated his/her understanding and acceptance.     Dental advisory given  Plan Discussed with: CRNA and Surgeon  Anesthesia Plan Comments:         Anesthesia Quick Evaluation

## 2019-11-04 NOTE — H&P (Signed)
Kaitlyn Ferguson    Chief Complaint: Left shoulder osteoarthritis HPI: The patient is a 73 y.o. female with chronic and progressively increasing left shoulder pain secondary to advanced OA. Due to increasing functional limitations and failure to respond to conservative management patient is brought to the OR today for TSA  Past Medical History:  Diagnosis Date  . Arthritis   . Diabetes mellitus   . DJD (degenerative joint disease) 10/07/2008  . DJD (degenerative joint disease)   . Family history of adverse reaction to anesthesia    MOTHER WAS DIFFICULT TO WAKE UP  . Hyperlipidemia   . Hypertension   . Hypothyroidism   . Nausea    INTERMITANT - TAKES PRILOSEC (DENIES REFLUX)  . Obesity   . Osteomyelitis of ankle (HCC)    Complications of Right ankle injury    Past Surgical History:  Procedure Laterality Date  . ABDOMINAL HYSTERECTOMY    . ANKLE FUSION  02/1996   Right ankle tib/talar  . BACK SURGERY  1984  . BREAST EXCISIONAL BIOPSY Right   . CARPAL TUNNEL RELEASE  1993   Right arm  . CHOLECYSTECTOMY    . EYE SURGERY Left   . NECK MASS EXCISION  12/29/2001  . right eye laser    . TONSILLECTOMY     Childhood  . TOTAL KNEE ARTHROPLASTY Left 12/21/2015   Procedure: LEFT TOTAL KNEE ARTHROPLASTY WITH COMPLEX COMPUTER NAVIGATION ;  Surgeon: Brian Swinteck, MD;  [facility];  Service: Orthopedics;  Laterality: Left;  . TOTAL SHOULDER ARTHROPLASTY Right 07/11/2016   Procedure: RIGHT TOTAL SHOULDER ARTHROPLASTY;  Surgeon: Kevin Supple, MD;  [facility];  Service: Orthopedics;  Laterality: Right;    Family History  Problem Relation Age of Onset  . Diabetes Brother   . Diabetes Sister        DM2-lost weight-now okay  . Heart attack Father        First MI- early 50's  . Stroke Mother   . Heart failure Mother   . Multiple myeloma Mother        Deceased at 73 due to same  . Breast cancer Maternal Aunt   . Heart attack Son     Social History:  reports that  she has never smoked. She has never used smokeless tobacco. She reports that she does not drink alcohol or use drugs.   Medications Prior to Admission  Medication Sig Dispense Refill  . amLODipine (NORVASC) 5 MG tablet Take 1 tablet (5 mg total) by mouth daily. 90 tablet 3  . aspirin EC 81 MG tablet Take 1 tablet (81 mg total) by mouth daily. 90 tablet 1  . cholecalciferol (VITAMIN D3) 25 MCG (1000 UNIT) tablet Take 1,000 Units by mouth daily.    . insulin degludec (TRESIBA FLEXTOUCH) 100 UNIT/ML SOPN FlexTouch Pen INJECT 60 UNITS UNDER THE SKIN ONCE DAILY AS DIRECTED (Patient taking differently: Inject 60 Units into the skin daily. ) 30 mL 1  . levothyroxine (SYNTHROID) 88 MCG tablet TAKE 1 TABLET BY MOUTH EVERY DAY BEFORE BREAKFAST (Patient taking differently: Take 88 mcg by mouth daily before breakfast. ) 90 tablet 3  . liraglutide (VICTOZA) 18 MG/3ML SOPN inject 0.3 milliliters (1.8 MG TOTAL) subcutaneously once daily (Patient taking differently: Inject 1.8 mg into the skin daily. ) 9 mL 11  . lisinopril-hydrochlorothiazide (ZESTORETIC) 20-12.5 MG tablet Take 1 tablet by mouth daily. 90 tablet 3  . metFORMIN (GLUCOPHAGE) 500 MG tablet Take 1.5 tablets (750 mg total) by   mouth 2 (two) times daily with a meal. 270 tablet 3  . Multiple Vitamin (MULTIVITAMIN WITH MINERALS) TABS tablet Take 1 tablet by mouth daily. With Vitamin D3    . omeprazole (PRILOSEC) 40 MG capsule Take 1 capsule (40 mg total) by mouth daily. 90 capsule 3  . rosuvastatin (CRESTOR) 20 MG tablet Take 1 tablet (20 mg total) by mouth every evening. 90 tablet 2  . Blood Glucose Monitoring Suppl (ONETOUCH VERIO FLEX SYSTEM) w/Device KIT 1 application by Does not apply route 2 (two) times daily. 1 kit 1  . Blood Glucose Monitoring Suppl (ONETOUCH VERIO) w/Device KIT Check blood sugars 2 times daily. Code: E11.9 1 kit 0  . glucose blood (ONETOUCH VERIO) test strip Use to check blood sugar twice daily or as directed. Code: E11.9 100  each 12  . ibuprofen (ADVIL,MOTRIN) 200 MG tablet Take 400 mg by mouth every 8 (eight) hours as needed for moderate pain.     . Insulin Pen Needle (B-D UF III MINI PEN NEEDLES) 31G X 5 MM MISC USE TWICE DAILY 100 each 12  . Lancet Devices (ONE TOUCH DELICA LANCING DEV) MISC Check blood sugars 2 times daily. Code: E11.9 1 each 0  . Lancets (FREESTYLE) lancets 1 each by Other route 2 (two) times daily. Use as instructed 100 each PRN  . Lancets (ONETOUCH DELICA PLUS [id number]) MISC 1 each by Does not apply route 2 (two) times daily. 100 each 12     Physical Exam: left shoulder with painful and restricted motion as noted at recent office visits  Vitals  Temp:  [97.8 F (36.6 C)] 97.8 F (36.6 C) (03/11 1020) Pulse Rate:  [96] 96 (03/11 1020) Resp:  [18] 18 (03/11 1020) BP: (141)/(81) 141/81 (03/11 1020) SpO2:  [98 %] 98 % (03/11 1020)  Assessment/Plan  Impression: Left shoulder osteoarthritis  Plan of Action: Procedure(s): TOTAL SHOULDER ARTHROPLASTY  Truc Winfree M Estiven Kohan 11/04/2019, 11:47 AM Contact # [phone]

## 2019-11-04 NOTE — Progress Notes (Signed)
Attempted call to granddaughter, no answer nor ability to leave VM

## 2019-11-04 NOTE — Anesthesia Procedure Notes (Signed)
Anesthesia Regional Block: Interscalene brachial plexus block   Pre-Anesthetic Checklist: ,, timeout performed, Correct Patient, Correct Site, Correct Laterality, Correct Procedure, Correct Position, site marked, Risks and benefits discussed,  Surgical consent,  Pre-op evaluation,  At surgeon's request and post-op pain management  Laterality: Left  Prep: chloraprep       Needles:  Injection technique: Single-shot  Needle Type: Echogenic Needle     Needle Length: 9cm      Additional Needles:   Procedures:,,,, ultrasound used (permanent image in chart),,,,  Narrative:  Start time: 11/04/2019 11:38 AM End time: 11/04/2019 11:48 AM Injection made incrementally with aspirations every 5 mL.  Performed by: Personally  Anesthesiologist: Myrtie Soman, MD  Additional Notes: Patient tolerated the procedure well without complications

## 2019-11-04 NOTE — Discharge Instructions (Signed)
Metta Clines. Supple, M.D., F.A.A.O.S. Orthopaedic Surgery Specializing in Arthroscopic and Reconstructive Surgery of the Shoulder [phone] [address] [place], [place] [address] - Fax [phone]   POST-OP TOTAL SHOULDER REPLACEMENT INSTRUCTIONS  1. Follow up for your first post-op appointment 10-14 days from the date of your surgery. If you do not have an appt our office will contact you  2. The bandage over your incision is waterproof. You may begin showering with this dressing on. You may leave this dressing on until first follow up appointment within 2 weeks. We prefer you leave this dressing in place until follow up however after 5-7 days if you are having itching or skin irritation and would like to remove it you may do so. Go slow and tug at the borders gently to break the bond the dressing has with the skin. At this point if there is no drainage it is okay to go without a bandage or you may cover it with a light guaze and tape. You can also expect significant bruising around your shoulder that will drift down your arm and into your chest wall. This is very normal and should resolve over several days.   3. Wear your sling/immobilizer at all times except to perform the exercises below or to occasionally let your arm dangle by your side to stretch your elbow. You also need to sleep in your sling immobilizer until instructed otherwise. It is ok to remove your sling if you are sitting in a controlled environment and allow your arm to rest in a position of comfort by your side or on your lap with pillows to give your neck and skin a break from the sling. You may remove it to allow arm to dangle by side to shower. If you are up walking around and when you go to sleep at night you need to wear it.  4. Range of motion to your elbow, wrist, and hand are encouraged 3-5 times daily. Exercise to your hand and fingers helps to reduce swelling you may experience.  5. Utilize ice to the  shoulder 3-5 times minimum a day and additionally if you are experiencing pain.  6. Prescriptions for a pain medication and a muscle relaxant are provided for you. It is recommended that if you are experiencing pain that you pain medication alone is not controlling, add the muscle relaxant along with the pain medication which can give additional pain relief. The first 1-2 days is generally the most severe of your pain and then should gradually decrease. As your pain lessens it is recommended that you decrease your use of the pain medications to an "as needed basis'" only and to always comply with the recommended dosages of the pain medications.  7. Pain medications can produce constipation along with their use. If you experience this, the use of an over the counter stool softener or laxative daily is recommended.   8. For additional questions or concerns, please do not hesitate to call the office. If after hours there is an answering service to forward your concerns to the physician on call.  9.Pain control following an exparel block  To help control your post-operative pain you received a nerve block  performed with Exparel which is a long acting anesthetic (numbing agent) which can provide pain relief and sensations of numbness (and relief of pain) in the operative shoulder and arm for up to 3 days. Sometimes it provides mixed relief, meaning you may still have numbness in  certain areas of the arm but can still be able to move  parts of that arm, hand, and fingers. We recommend that your prescribed pain medications  be used as needed. We do not feel it is necessary to "pre medicate" and "stay ahead" of pain.  Taking narcotic pain medications when you are not having any pain can lead to unnecessary and potentially dangerous side effects.    10. Use the ice machine as much as possible in the first 5-7 days from surgery, then you can wean its use to as needed. The ice typically needs to be replaced every  6 hours, instead of ice you can actually freeze water bottles to put in the cooler and then fill water around them to avoid having to purchase ice. You can have spare water bottles freezing to allow you to rotate them once they have melted. Try to have a thin shirt or light cloth or towel under the ice wrap to protect your skin.   11.  We recommend that you avoid any dental work or cleaning in the first 3 months following your joint replacement. This is to help minimize the possibility of infection from the bacteria in your mouth that enters your bloodstream during dental work. We also recommend that you take an antibiotic prior to your dental work for the first year after your shoulder replacement to further help reduce that risk. Please simply contact our office for antibiotics to be sent to your pharmacy prior to dental work.  POST-OP EXERCISES  Pendulum Exercises  Perform pendulum exercises while standing and bending at the waist. Support your uninvolved arm on a table or chair and allow your operated arm to hang freely. Make sure to do these exercises passively - not using you shoulder muscles. These exercises can be performed once your nerve block effects have worn off.  Repeat 20 times. Do 3 sessions per day.

## 2019-11-04 NOTE — Anesthesia Procedure Notes (Signed)
Anesthesia Procedure Image    

## 2019-11-04 NOTE — Transfer of Care (Signed)
Immediate Anesthesia Transfer of Care Note  Patient: Kaitlyn Ferguson  Procedure(s) Performed: Procedure(s) with comments: TOTAL SHOULDER ARTHROPLASTY (Left) - 124min  Patient Location: PACU  Anesthesia Type:General  Level of Consciousness: Alert, Awake, Oriented  Airway & Oxygen Therapy: Patient Spontanous Breathing  Post-op Assessment: Report given to RN  Post vital signs: Reviewed and stable  Last Vitals:  Vitals:   11/04/19 1232 11/04/19 1445  BP:  (!) 201/77  Pulse: 73 97  Resp:    Temp:    SpO2: [id number]    Complications: No apparent anesthesia complications

## 2019-11-04 NOTE — Op Note (Signed)
11/04/2019  2:26 PM  PATIENT:   Kaitlyn Ferguson  74 y.o. female  PRE-OPERATIVE DIAGNOSIS:  Left shoulder osteoarthritis  POST-OPERATIVE DIAGNOSIS: Same  PROCEDURE: Left anatomic total shoulder arthroplasty utilizing a press-fit size 10 Arthrex stem with a 46 x 20 eccentric head and a medium glenoid  SURGEON:  Natahlia Hoggard, Metta Clines M.D.  ASSISTANTS: Jenetta Loges, PA-C  ANESTHESIA:   General endotracheal and interscalene block with Exparel  EBL: 150 cc  SPECIMEN: None  Drains: None   PATIENT DISPOSITION:  PACU - hemodynamically stable.    PLAN OF CARE: Discharge to home after PACU  Brief history:  Patient is a 74 year old female who presents with chronic and progressively increasing left shoulder pain related to advanced osteoarthritis.  Her plain radiographs confirm complete loss of joint space with prominent peripheral osteophytes and subchondral sclerosis.  She had undergone a right shoulder arthroplasty that we performed back in 2017 and has done extremely well.  She is now seen today for planned left total shoulder arthroplasty.  Preoperatively I counseled [name] regarding treatment options as well as the potential risks versus benefits thereof.  Possible surgical complications were reviewed including bleeding, infection, neurovascular injury, persistent pain, loss of motion, anesthetic complication, failure the implant, and possible need for additional surgery.  She understands, and accepts, and agrees with our planned procedure.  Procedure in detail:  After undergoing routine preop evaluation patient received prophylactic antibiotics and an interscalene block with Exparel was established in the holding area by the anesthesia department.  Patient subsequently placed supine on the operating table and underwent the smooth induction of a general endotracheal anesthesia.  Placed into the beachchair position and appropriately padded and protected.  The left shoulder  girdle region was sterilely prepped and draped in standard fashion.  Timeout was called.  An anterior deltopectoral approach left shoulder is made through a 10 cm incision.  Skin flaps were elevated dissection carried deeply and the deltopectoral interval was then developed from proximal to distal with the vein taken laterally.  The upper centimeter and half of the pectoralis major tendon was tenotomized for exposure and the conjoined tendon was mobilized and retracted medially.  The long head biceps tendon was then tenodesed at the upper border the pectoralis major tendon and the proximal segment was unroofed and excised.  The rotator cuff was split along the rotator interval and the insertion of the subscapularis was outlined at its attachment to the lesser tuberosity.  An oscillating saw was then used to perform a lesser tuberosity osteotomy removing a wafer of bone and the subscap was then tagged and reflected medially and the capsular attachments were then divided from the anterior and infra margins of the humeral neck.  There is a very large anterior and inferior osteophyte.  We used the extra medullary guide to outline our proposed humeral head resection which was performed with an oscillating saw taking care to protect the rotator cuff superiorly and posteriorly.  A rondure was then used to remove the remnant of the large osteophyte on the humeral neck.  The humeral canal was then prepared hand reaming to size 7 and broaching to a size 10 with excellent fit.  Size 9 trial with metal cap introduced into the humeral canal and our attention was then directed to the glenoid which was exposed with appropriate retractors and a circumferential labral resection was then completed then once complete visualization of the periphery of the glenoid was achieved a guidepin was then directed into the  center of the glenoid and glenoid was then reamed with the medium reamer for the medium glenoid.  Preparation was completed  with the central drill followed by the superior and inferior peg and slot respectively and the glenoid was then broached and a trial showed excellent fit.  The glenoid was then cleaned and dried.  Cement was mixed and introduced in the superior and inferior peg and slot respectively.  We did apply morselized bone graft about the central peg of our glenoid.  Glenoid was then impacted with excellent fit and fixation.  We then returned our attention back to the proximal humerus where a series of shuttle sutures were passed through bone tunnels 1 medial to lateral to the LTO donor site for our future LTO repair.  Our humeral stem had been threaded with the sutures for the LTO repair and the stem was provisionally placed the sutures were all then shuttled and the stem was terminally seated with excellent fit and fixation.  The proximal locking screws were then tightened.  We then performed a series of trial reductions and ultimately found that the 46 x 20 eccentric head gave [place] the best soft tissue balance.  The final 46 x 20 head was then impacted onto the [facility] taper after it was carefully cleaned and dried.  Final reduction was then performed with again soft tissue balance was much our satisfaction with approximately 50% translation of the humeral head of the glenoid.  The joint was copiously irrigated.  Hemostasis was obtained.  The subscapularis was then appropriately mobilized in our suture construct for the LTO repair was then passed with the suture limbs through the bone tendon junction medially and the sutures were then tied using our standard construct with 2 horizontal sutures and 2 crossing sutures which allowed excellent reapposition of the LTO bone fragment to the donor site.  The rotator interval was repaired with a series of figure-of-eight suture tape sutures.  At this point the arm easily achieved 45 degrees of external rotation without excessive tension on the repair.  Again the shoulder was copes  irrigated.  Final hemostasis was obtained.  The deltopectoral interval was reapproximated with a series of figure-of-eight and 1 Vicryl sutures.  2-0 Vicryl used for the subcu layer and intracuticular 3 Monocryl for the skin followed by Dermabond and Aquasol dressing.  Left arm was placed into a sling and the patient was awakened, extubated, and taken to the recovery room in stable condition.  Jenetta Loges, PA-C was used as an Environmental consultant throughout this case essential for help with positioning the patient, position extremity, tissue manipulation, implantation of the prosthesis, wound closure, and intraoperative decision-making.  Metta Clines Hendryx Ricke MD   Contact # [phone]

## 2019-11-04 NOTE — Anesthesia Postprocedure Evaluation (Signed)
Anesthesia Post Note  Patient: Kaitlyn Ferguson  Procedure(s) Performed: TOTAL SHOULDER ARTHROPLASTY (Left Shoulder)     Patient location during evaluation: PACU Anesthesia Type: General Level of consciousness: awake and alert Pain management: pain level controlled Vital Signs Assessment: post-procedure vital signs reviewed and stable Respiratory status: spontaneous breathing, nonlabored ventilation, respiratory function stable and patient connected to nasal cannula oxygen Cardiovascular status: blood pressure returned to baseline and stable Postop Assessment: no apparent nausea or vomiting Anesthetic complications: no    Last Vitals:  Vitals:   11/04/19 1515 11/04/19 1530  BP: (!) 168/97 (!) 174/72  Pulse: 85 90  Resp: 11 (!) 7  Temp:    SpO2: 98% 99%    Last Pain:  Vitals:   11/04/19 1530  TempSrc:   PainSc: Asleep                 Suyash Amory S

## 2019-11-04 NOTE — Progress Notes (Signed)
Assisted Dr. Kalman Shan with left, ultrasound guided, interscalene  block. Side rails up, monitors on throughout procedure. See vital signs in flow sheet. Tolerated Procedure well.

## 2019-11-04 NOTE — Anesthesia Procedure Notes (Addendum)
Procedure Name: Intubation Date/Time: 11/04/2019 12:56 PM Performed by: West Pugh, CRNA Pre-anesthesia Checklist: Patient identified, Emergency Drugs available, Suction available, Patient being monitored and Timeout performed Patient Re-evaluated:Patient Re-evaluated prior to induction Oxygen Delivery Method: Circle system utilized Preoxygenation: Pre-oxygenation with 100% oxygen Induction Type: IV induction Ventilation: Mask ventilation without difficulty and Oral airway inserted - appropriate to patient size Laryngoscope Size: 3 and Glidescope Grade View: Grade I Tube type: Oral Tube size: 7.0 mm Number of attempts: 1 Airway Equipment and Method: Video-laryngoscopy and Rigid stylet Placement Confirmation: ETT inserted through vocal cords under direct vision,  positive ETCO2,  CO2 detector and breath sounds checked- equal and bilateral Secured at: 22 cm Tube secured with: Tape Dental Injury: Teeth and Oropharynx as per pre-operative assessment  Difficulty Due To: Difficulty was anticipated Comments: Prior anesthetic records indicate patient to be Grade 3 with a MAC 4. Bougie stylet utilized and established 7.53m tube. Glidescope used for following procedure.

## 2019-11-05 ENCOUNTER — Encounter: Payer: Self-pay | Admitting: *Deleted

## 2019-11-05 DIAGNOSIS — E039 Hypothyroidism, unspecified: Secondary | ICD-10-CM | POA: Diagnosis not present

## 2019-11-05 DIAGNOSIS — Z794 Long term (current) use of insulin: Secondary | ICD-10-CM | POA: Diagnosis not present

## 2019-11-05 DIAGNOSIS — Z881 Allergy status to other antibiotic agents status: Secondary | ICD-10-CM | POA: Diagnosis not present

## 2019-11-05 DIAGNOSIS — M19012 Primary osteoarthritis, left shoulder: Secondary | ICD-10-CM | POA: Diagnosis not present

## 2019-11-05 DIAGNOSIS — Z888 Allergy status to other drugs, medicaments and biological substances status: Secondary | ICD-10-CM | POA: Diagnosis not present

## 2019-11-05 DIAGNOSIS — Z79899 Other long term (current) drug therapy: Secondary | ICD-10-CM | POA: Diagnosis not present

## 2019-11-05 DIAGNOSIS — Z981 Arthrodesis status: Secondary | ICD-10-CM | POA: Diagnosis not present

## 2019-11-05 DIAGNOSIS — E785 Hyperlipidemia, unspecified: Secondary | ICD-10-CM | POA: Diagnosis not present

## 2019-11-05 DIAGNOSIS — Z7982 Long term (current) use of aspirin: Secondary | ICD-10-CM | POA: Diagnosis not present

## 2019-11-05 DIAGNOSIS — Z8249 Family history of ischemic heart disease and other diseases of the circulatory system: Secondary | ICD-10-CM | POA: Diagnosis not present

## 2019-11-05 DIAGNOSIS — E119 Type 2 diabetes mellitus without complications: Secondary | ICD-10-CM | POA: Diagnosis not present

## 2019-11-05 DIAGNOSIS — I10 Essential (primary) hypertension: Secondary | ICD-10-CM | POA: Diagnosis not present

## 2019-11-05 DIAGNOSIS — Z833 Family history of diabetes mellitus: Secondary | ICD-10-CM | POA: Diagnosis not present

## 2019-11-05 DIAGNOSIS — Z96652 Presence of left artificial knee joint: Secondary | ICD-10-CM | POA: Diagnosis not present

## 2019-11-05 DIAGNOSIS — Z96611 Presence of right artificial shoulder joint: Secondary | ICD-10-CM | POA: Diagnosis not present

## 2019-11-05 DIAGNOSIS — M25712 Osteophyte, left shoulder: Secondary | ICD-10-CM | POA: Diagnosis not present

## 2019-11-05 LAB — GLUCOSE, CAPILLARY: Glucose-Capillary: 266 mg/dL — ABNORMAL HIGH (ref 70–99)

## 2019-11-05 NOTE — Plan of Care (Signed)
All discharge instructions were given to Pt. All questions were answered.

## 2019-11-05 NOTE — Discharge Summary (Signed)
PATIENT ID:      Kaitlyn Ferguson  MRN:     [id number] DOB/AGE:    Nov 25, 1945 / 74 y.o.     DISCHARGE SUMMARY  ADMISSION DATE:    11/04/2019 DISCHARGE DATE:    ADMISSION DIAGNOSIS: Left shoulder osteoarthritis Past Medical History:  Diagnosis Date  . Arthritis   . Diabetes mellitus   . DJD (degenerative joint disease) 10/07/2008  . DJD (degenerative joint disease)   . Family history of adverse reaction to anesthesia    MOTHER WAS DIFFICULT TO WAKE UP  . Hyperlipidemia   . Hypertension   . Hypothyroidism   . Nausea    INTERMITANT - TAKES PRILOSEC (DENIES REFLUX)  . Obesity   . Osteomyelitis of ankle (HCC)    Complications of Right ankle injury    DISCHARGE DIAGNOSIS:   Active Problems:   Status post arthroscopy of left shoulder   Status post total shoulder arthroplasty, left   PROCEDURE: Procedure(s): TOTAL SHOULDER ARTHROPLASTY on 11/04/2019  CONSULTS:    HISTORY:  See H&P in chart.  HOSPITAL COURSE:  Kaitlyn Ferguson is a 74 y.o. admitted on 11/04/2019 with a diagnosis of Left shoulder osteoarthritis.  They were brought to the operating room on 11/04/2019 and underwent Procedure(s): TOTAL SHOULDER ARTHROPLASTY.    They were given perioperative antibiotics:  Anti-infectives (From admission, onward)   Start     Dose/Rate Route Frequency Ordered Stop   11/04/19 1030  ceFAZolin (ANCEF) IVPB 2g/100 mL premix     2 g 200 mL/hr over 30 Minutes Intravenous On call to O.R. 11/04/19 1024 11/04/19 1327    .  Patient underwent the above named procedure and tolerated it well. The following day they were hemodynamically stable and pain was controlled on oral analgesics. They were neurovascularly intact to the operative extremity. OT was ordered and worked with patient per protocol. They were medically and orthopaedically stable for discharge on .    DIAGNOSTIC STUDIES:  RECENT RADIOGRAPHIC STUDIES :  No results found.  RECENT VITAL SIGNS:   Patient Vitals for  the past 24 hrs:  BP Temp Temp src Pulse Resp SpO2 Height Weight  11/05/19 0644 130/64 98.1 F (36.7 C) Oral (!) 59 16 94 % - -  11/05/19 0115 (!) 151/57 98.2 F (36.8 C) Oral (!) 56 20 97 % - -  11/04/19 2031 (!) 151/64 98.3 F (36.8 C) Oral 88 20 95 % - -  11/04/19 1941 (!) 145/69 98.4 F (36.9 C) Oral 88 20 96 % - -  11/04/19 1834 - - - 91 - 91 % - -  11/04/19 1833 139/72 98.7 F (37.1 C) - 92 18 (!) 89 % - -  11/04/19 1730 - - - - - - '5\' 7"'  (1.702 m) 113.4 kg  11/04/19 1718 (!) 165/65 98.1 F (36.7 C) Oral 95 15 95 % - -  11/04/19 1700 (!) 160/74 98.5 F (36.9 C) - 91 14 93 % - -  11/04/19 1645 (!) 170/69 - - 91 13 95 % - -  11/04/19 1630 (!) 163/71 - - 93 13 96 % - -  11/04/19 1615 (!) 168/74 - - 91 13 96 % - -  11/04/19 1600 (!) 165/82 - - 89 15 93 % - -  11/04/19 1545 (!) 179/73 - - 93 14 94 % - -  11/04/19 1530 (!) 174/72 - - 90 (!) 7 99 % - -  11/04/19 1515 (!) 168/97 - - 85 11 98 % - -  11/04/19 1500 (!) 183/80 - - 88 14 100 % - -  11/04/19 1452 (!) 183/81 - - 91 15 99 % - -  11/04/19 1445 (!) 201/77 97.9 F (36.6 C) - 97 (!) 40 98 % - -  11/04/19 1232 - - - 73 - 98 % - -  11/04/19 1231 - - - 74 - 98 % - -  11/04/19 1230 - - - 67 - 98 % - -  11/04/19 1229 133/84 - - 73 - 100 % - -  11/04/19 1228 - - - 67 - 100 % - -  11/04/19 1227 - - - 73 14 98 % - -  11/04/19 1226 - - - 76 18 98 % - -  11/04/19 1225 - - - 75 18 98 % - -  11/04/19 1224 130/74 - - 76 (!) 21 98 % - -  11/04/19 1223 - - - 79 19 98 % - -  11/04/19 1222 - - - 74 16 96 % - -  11/04/19 1221 - - - 72 18 98 % - -  11/04/19 1220 - - - 73 18 97 % - -  11/04/19 1219 128/69 - - 73 17 97 % - -  11/04/19 1218 - - - 78 19 97 % - -  11/04/19 1217 - - - 77 17 97 % - -  11/04/19 1216 - - - 74 16 97 % - -  11/04/19 1215 - - - 78 16 97 % - -  11/04/19 1214 136/66 - - 77 18 97 % - -  11/04/19 1213 - - - 79 19 97 % - -  11/04/19 1212 - - - 71 17 99 % - -  11/04/19 1211 - - - 76 16 97 % - -  11/04/19 1210 - - -  77 18 97 % - -  11/04/19 1209 (!) 141/69 - - 75 18 97 % - -  11/04/19 1208 - - - 76 17 97 % - -  11/04/19 1207 - - - 77 16 97 % - -  11/04/19 1206 - - - 76 17 97 % - -  11/04/19 1205 - - - 74 17 97 % - -  11/04/19 1204 133/69 - - 73 18 97 % - -  11/04/19 1203 - - - 73 17 97 % - -  11/04/19 1202 - - - 74 16 98 % - -  11/04/19 1201 - - - 85 18 96 % - -  11/04/19 1200 - - - 82 18 97 % - -  11/04/19 1159 (!) 152/75 - - 78 16 95 % - -  11/04/19 1158 - - - 74 16 96 % - -  11/04/19 1157 - - - 78 15 98 % - -  11/04/19 1156 - - - 73 13 98 % - -  11/04/19 1155 - - - 80 16 97 % - -  11/04/19 1154 (!) 153/77 - - 84 15 98 % - -  11/04/19 1153 - - - 69 18 99 % - -  11/04/19 1152 - - - 80 16 98 % - -  11/04/19 1151 - - - 86 18 97 % - -  11/04/19 1150 - - - 85 16 97 % - -  11/04/19 1149 (!) 161/75 - - 86 16 96 % - -  11/04/19 1148 - - - 81 16 96 % - -  11/04/19 1147 - - - 84 15 96 % - -  11/04/19 1146 - - - 83 15 97 % - -  11/04/19 1145 - - - 82 15 98 % - -  11/04/19 1144 (!) 173/77 - - 88 20 97 % - -  11/04/19 1143 - - - - 17 - - -  11/04/19 1142 - - - - 17 - - -  11/04/19 1141 - - - - 18 - - -  11/04/19 1140 - - - - 14 - - -  11/04/19 1139 (!) 163/76 - - - 16 98 % - -  11/04/19 1020 (!) 141/81 97.8 F (36.6 C) Oral 96 18 98 % - -  .  RECENT EKG RESULTS:    Orders placed or performed in visit on 10/27/19  . EKG 12-Lead  . EKG 12-Lead  . EKG 12-Lead    DISCHARGE INSTRUCTIONS:  Discharge Instructions    Discharge patient   Complete by: As directed    Discharge disposition: 01-Home or Self Care   Discharge patient date: 11/04/2019      DISCHARGE MEDICATIONS:   Allergies as of 11/05/2019      Reactions   Gabapentin Nausea Only   Vancomycin Itching   Pre-medicated with benadryl and tylenol 1 hr before administration of vanc      Medication List    STOP taking these medications   ibuprofen 200 MG tablet Commonly known as: ADVIL     TAKE these medications   amLODipine 5  MG tablet Commonly known as: NORVASC Take 1 tablet (5 mg total) by mouth daily.   aspirin EC 81 MG tablet Take 1 tablet (81 mg total) by mouth daily.   cholecalciferol 25 MCG (1000 UNIT) tablet Commonly known as: VITAMIN D3 Take 1,000 Units by mouth daily.   cyclobenzaprine 10 MG tablet Commonly known as: FLEXERIL Take 1 tablet (10 mg total) by mouth 3 (three) times daily as needed for muscle spasms.   freestyle lancets 1 each by Other route 2 (two) times daily. Use as instructed   OneTouch Delica Plus [id number] Misc 1 each by Does not apply route 2 (two) times daily.   Insulin Pen Needle 31G X 5 MM Misc Commonly known as: B-D UF III MINI PEN NEEDLES USE TWICE DAILY   levothyroxine 88 MCG tablet Commonly known as: SYNTHROID TAKE 1 TABLET BY MOUTH EVERY DAY BEFORE BREAKFAST What changed:   how much to take  how to take this  when to take this  additional instructions   lisinopril-hydrochlorothiazide 20-12.5 MG tablet Commonly known as: ZESTORETIC Take 1 tablet by mouth daily.   metFORMIN 500 MG tablet Commonly known as: GLUCOPHAGE Take 1.5 tablets (750 mg total) by mouth 2 (two) times daily with a meal.   multivitamin with minerals Tabs tablet Take 1 tablet by mouth daily. With Vitamin D3   naproxen 500 MG tablet Commonly known as: Naprosyn Take 1 tablet (500 mg total) by mouth 2 (two) times daily with a meal.   omeprazole 40 MG capsule Commonly known as: PRILOSEC Take 1 capsule (40 mg total) by mouth daily.   ondansetron 4 MG tablet Commonly known as: Zofran Take 1 tablet (4 mg total) by mouth every 8 (eight) hours as needed for nausea or vomiting.   ONE TOUCH DELICA LANCING DEV Misc Check blood sugars 2 times daily. Code: E11.9   OneTouch Verio Flex System w/Device Kit 1 application by Does not apply route 2 (two) times daily.   OneTouch Verio w/Device Kit Check blood sugars 2 times daily. Code: E11.9  OneTouch Verio test strip Generic drug:  glucose blood Use to check blood sugar twice daily or as directed. Code: E11.9   oxyCODONE-acetaminophen 5-325 MG tablet Commonly known as: Percocet Take 1 tablet by mouth every 4 (four) hours as needed (max 6 q).   rosuvastatin 20 MG tablet Commonly known as: CRESTOR Take 1 tablet (20 mg total) by mouth every evening.   Tyler Aas FlexTouch 100 UNIT/ML FlexTouch Pen Generic drug: insulin degludec INJECT 60 UNITS UNDER THE SKIN ONCE DAILY AS DIRECTED What changed:   how much to take  how to take this  when to take this  additional instructions   Victoza 18 MG/3ML Sopn Generic drug: liraglutide inject 0.3 milliliters (1.8 MG TOTAL) subcutaneously once daily What changed:   how much to take  how to take this  when to take this  additional instructions       FOLLOW UP VISIT:   Follow-up Information    Justice Britain, MD.   Specialty: Orthopedic Surgery Why: in 10-14 days Contact information: [address] [place] [address] [phone]           DISCHARGE TO: Home   DISCHARGE CONDITION:  [place] for Dr. Justice Britain 11/05/2019, 7:51 AM

## 2019-11-05 NOTE — Evaluation (Addendum)
Occupational Therapy Evaluation Patient Details Name: Kaitlyn Ferguson MRN: [id number] DOB: September 16, 1945 Today's Date: 11/05/2019    History of Present Illness 74 y o female is s/p L TSA.  H/O R TSA    Clinical Impression   Pt was admitted for the above sx. All education was reviewed. She will follow up with Dr Kaitlyn Ferguson for further rehab needs.    Follow Up Recommendations  Follow surgeon's recommendation for DC plan and follow-up therapies    Equipment Recommendations  None recommended by OT    Recommendations for Other Services       Precautions / Restrictions Precautions Precautions: Shoulder Type of Shoulder Precautions: May come out of sling in controlled environment, may use arm passively during adls within the following parameters: 20 ER, 45 ABD, 60 FF.  May move elbow wrist and hand and perform gentle pendulums Shoulder Interventions: Shoulder sling/immobilizer Precaution Booklet Issued: Yes (comment) Restrictions Weight Bearing Restrictions: Yes LUE Weight Bearing: Non weight bearing      Mobility Bed Mobility               General bed mobility comments: min A for OOB  Transfers Overall transfer level: Modified independent               General transfer comment: used bedrail to stand    Balance                                           ADL either performed or assessed with clinical judgement   ADL Overall ADL's : Needs assistance/impaired Eating/Feeding: Independent   Grooming: Wash/dry hands;Modified independent   Upper Body Bathing: Minimal assistance   Lower Body Bathing: Moderate assistance   Upper Body Dressing : Moderate assistance   Lower Body Dressing: Moderate assistance   Toilet Transfer: Modified Independent   Toileting- Clothing Manipulation and Hygiene: Modified independent         General ADL Comments: ambulated in hall, performed dressing and gentle pendulums, distal ROM.  Reviewed  precautions, ice machine, protocol. Pt verbalizes understanding.  Pt's arm is "awake" and she has a tendency to actively move it automatically; she will need to try to think about performing normally automatic ADL activities     Vision         Perception     Praxis      Pertinent Vitals/Pain Pain Assessment: Faces Faces Pain Scale: Hurts a little bit Pain Location: L shoulder Pain Intervention(s): Limited activity within patient's tolerance;Monitored during session;Repositioned     Hand Dominance Right   Extremity/Trunk Assessment Upper Extremity Assessment Upper Extremity Assessment: (able to move L elbow wrist and fingers)           Communication Communication Communication: No difficulties   Cognition Arousal/Alertness: Awake/alert Behavior During Therapy: WFL for tasks assessed/performed Overall Cognitive Status: Within Functional Limits for tasks assessed                                     General Comments       Exercises     Shoulder Instructions      Home Living Family/patient expects to be discharged to:: Private residence Living Arrangements: Alone Available Help at Discharge: Family               Bathroom Shower/Tub: Tub/shower  unit   Bathroom Toilet: Handicapped height     Home Equipment: Grab bars - tub/shower   Additional Comments: will have help around the clock until Tuesday then intermittently      Prior Functioning/Environment Level of Independence: Independent                 OT Problem List:        OT Treatment/Interventions:      OT Goals(Current goals can be found in the care plan section) Acute Rehab OT Goals OT Goal Formulation: All assessment and education complete, DC therapy  OT Frequency:     Barriers to D/C:            Co-evaluation              AM-PAC OT "6 Clicks" Daily Activity     Outcome Measure   Help from another person taking care of personal grooming?: None Help  from another person toileting, which includes using toliet, bedpan, or urinal?: None Help from another person bathing (including washing, rinsing, drying)?: A Lot Help from another person to put on and taking off regular upper body clothing?: A Lot Help from another person to put on and taking off regular lower body clothing?: A Lot 6 Click Score: 14   End of Session    Activity Tolerance: Patient tolerated treatment well Patient left: in chair;with call bell/phone within reach  OT Visit Diagnosis: Muscle weakness (generalized) (M62.81)                Time: [id number] OT Time Calculation (min): 37 min Charges:  OT General Charges $OT Visit: 1 Visit OT Evaluation $OT Eval Low Complexity: 1 Low OT Treatments $Self Care/Home Management : 8-22 mins  Kaitlyn Ferguson S, OTR/L Acute Rehabilitation Services 11/05/2019  [place] 11/05/2019, 9:47 AM

## 2019-11-10 ENCOUNTER — Ambulatory Visit (INDEPENDENT_AMBULATORY_CARE_PROVIDER_SITE_OTHER): Payer: Medicare Other | Admitting: Family Medicine

## 2019-11-10 ENCOUNTER — Encounter: Payer: Self-pay | Admitting: Family Medicine

## 2019-11-10 ENCOUNTER — Ambulatory Visit: Payer: Medicare Other | Attending: Internal Medicine

## 2019-11-10 ENCOUNTER — Other Ambulatory Visit: Payer: Self-pay

## 2019-11-10 DIAGNOSIS — E119 Type 2 diabetes mellitus without complications: Secondary | ICD-10-CM

## 2019-11-10 DIAGNOSIS — Z23 Encounter for immunization: Secondary | ICD-10-CM

## 2019-11-10 NOTE — Patient Instructions (Addendum)
Lets treat your diabetes with a higher dose of the insulin.   1. Stay on metformin 500mg  twice a day. 2. For now stay on 64 units of insulin Go up one uit if any morning blood sugar is above 150 or if any blood sugar is above 200.  Do not go over 70 units daily. See me in early June for another A1C.  Our goal is less than 8.   Of course, call me sooner if problems.

## 2019-11-10 NOTE — Progress Notes (Signed)
   Covid-19 Vaccination Clinic  Name:  Kaitlyn Ferguson    MRN: [id number] DOB: [birth year]  11/10/2019  Ms. Yardley was observed post Covid-19 immunization for 15 minutes without incident. She was provided with Vaccine Information Sheet and instruction to access the V-Safe system.   Ms. Ambriz was instructed to call 911 with any severe reactions post vaccine: Marland Kitchen Difficulty breathing  . Swelling of face and throat  . A fast heartbeat  . A bad rash all over body  . Dizziness and weakness   Immunizations Administered    Name Date Dose VIS Date Route   Pfizer COVID-19 Vaccine 11/10/2019  1:31 PM 0.3 mL 08/06/2019 Intramuscular   Manufacturer: [place]   Lot: [id number]   [place]: [id number]

## 2019-11-12 ENCOUNTER — Encounter: Payer: Self-pay | Admitting: Family Medicine

## 2019-11-12 NOTE — Assessment & Plan Note (Signed)
Discussed option of increasing insulin or adding an SGLT2.  She wants increase insulin.  Discussed rationale - see AVS.  Goal A1Cis less than 8 given age.  We don't have that far to go.  She also looks forward to increasing her activity with spring and as she recovers from her shoulder surgery.

## 2019-11-12 NOTE — Progress Notes (Signed)
    SUBJECTIVE:   CHIEF COMPLAINT / HPI:   Diabetes and more Recent surgery on left shoulder.  In pain but otherwise fine.  Being managed by ortho DM - We have figured out that the max dose of metformin she can tolerate without diarrhea is 500 mg bid.  Unfortunately, her BS is up with that dose.  She has self increased her dose of insulin to 64 units.  She is on a GLP1 but not on SGLT2    OBJECTIVE:   BP (!) 102/50   Pulse 81   Ht 5\' 7"  (1.702 m)   Wt 254 lb 12.8 oz (115.6 kg)   SpO2 99%   BMI 39.91 kg/m   Lungs clear Cardiac RRR without m or g   ASSESSMENT/PLAN:   No problem-specific Assessment & Plan notes found for this encounter.     [name], MD [place]

## 2019-11-17 DIAGNOSIS — Z96612 Presence of left artificial shoulder joint: Secondary | ICD-10-CM | POA: Diagnosis not present

## 2019-11-17 DIAGNOSIS — Z471 Aftercare following joint replacement surgery: Secondary | ICD-10-CM | POA: Diagnosis not present

## 2019-11-23 DIAGNOSIS — H35722 Serous detachment of retinal pigment epithelium, left eye: Secondary | ICD-10-CM | POA: Diagnosis not present

## 2019-11-23 DIAGNOSIS — H353221 Exudative age-related macular degeneration, left eye, with active choroidal neovascularization: Secondary | ICD-10-CM | POA: Diagnosis not present

## 2019-11-23 DIAGNOSIS — H3562 Retinal hemorrhage, left eye: Secondary | ICD-10-CM | POA: Diagnosis not present

## 2019-11-23 DIAGNOSIS — H35371 Puckering of macula, right eye: Secondary | ICD-10-CM | POA: Diagnosis not present

## 2019-11-24 DIAGNOSIS — Z9883 Filtering (vitreous) bleb after glaucoma surgery status: Secondary | ICD-10-CM | POA: Diagnosis not present

## 2019-11-24 DIAGNOSIS — H401122 Primary open-angle glaucoma, left eye, moderate stage: Secondary | ICD-10-CM | POA: Diagnosis not present

## 2019-11-24 DIAGNOSIS — H353111 Nonexudative age-related macular degeneration, right eye, early dry stage: Secondary | ICD-10-CM | POA: Diagnosis not present

## 2019-11-24 DIAGNOSIS — H40021 Open angle with borderline findings, high risk, right eye: Secondary | ICD-10-CM | POA: Diagnosis not present

## 2019-12-01 DIAGNOSIS — M25512 Pain in left shoulder: Secondary | ICD-10-CM | POA: Diagnosis not present

## 2019-12-01 DIAGNOSIS — M25612 Stiffness of left shoulder, not elsewhere classified: Secondary | ICD-10-CM | POA: Diagnosis not present

## 2019-12-06 DIAGNOSIS — M25512 Pain in left shoulder: Secondary | ICD-10-CM | POA: Diagnosis not present

## 2019-12-06 DIAGNOSIS — M25612 Stiffness of left shoulder, not elsewhere classified: Secondary | ICD-10-CM | POA: Diagnosis not present

## 2019-12-13 ENCOUNTER — Other Ambulatory Visit: Payer: Self-pay | Admitting: Family Medicine

## 2019-12-14 DIAGNOSIS — M25512 Pain in left shoulder: Secondary | ICD-10-CM | POA: Diagnosis not present

## 2019-12-14 DIAGNOSIS — M25612 Stiffness of left shoulder, not elsewhere classified: Secondary | ICD-10-CM | POA: Diagnosis not present

## 2019-12-15 DIAGNOSIS — Z471 Aftercare following joint replacement surgery: Secondary | ICD-10-CM | POA: Diagnosis not present

## 2019-12-15 DIAGNOSIS — Z96612 Presence of left artificial shoulder joint: Secondary | ICD-10-CM | POA: Diagnosis not present

## 2019-12-16 DIAGNOSIS — M25512 Pain in left shoulder: Secondary | ICD-10-CM | POA: Diagnosis not present

## 2019-12-16 DIAGNOSIS — M25612 Stiffness of left shoulder, not elsewhere classified: Secondary | ICD-10-CM | POA: Diagnosis not present

## 2019-12-21 DIAGNOSIS — M25512 Pain in left shoulder: Secondary | ICD-10-CM | POA: Diagnosis not present

## 2019-12-21 DIAGNOSIS — M25612 Stiffness of left shoulder, not elsewhere classified: Secondary | ICD-10-CM | POA: Diagnosis not present

## 2019-12-23 DIAGNOSIS — M25612 Stiffness of left shoulder, not elsewhere classified: Secondary | ICD-10-CM | POA: Diagnosis not present

## 2019-12-23 DIAGNOSIS — M25512 Pain in left shoulder: Secondary | ICD-10-CM | POA: Diagnosis not present

## 2019-12-31 DIAGNOSIS — M25612 Stiffness of left shoulder, not elsewhere classified: Secondary | ICD-10-CM | POA: Diagnosis not present

## 2019-12-31 DIAGNOSIS — M25512 Pain in left shoulder: Secondary | ICD-10-CM | POA: Diagnosis not present

## 2020-01-04 DIAGNOSIS — M25612 Stiffness of left shoulder, not elsewhere classified: Secondary | ICD-10-CM | POA: Diagnosis not present

## 2020-01-04 DIAGNOSIS — M25512 Pain in left shoulder: Secondary | ICD-10-CM | POA: Diagnosis not present

## 2020-01-10 DIAGNOSIS — M25512 Pain in left shoulder: Secondary | ICD-10-CM | POA: Diagnosis not present

## 2020-01-10 DIAGNOSIS — M25612 Stiffness of left shoulder, not elsewhere classified: Secondary | ICD-10-CM | POA: Diagnosis not present

## 2020-01-13 DIAGNOSIS — M25512 Pain in left shoulder: Secondary | ICD-10-CM | POA: Diagnosis not present

## 2020-01-13 DIAGNOSIS — M25612 Stiffness of left shoulder, not elsewhere classified: Secondary | ICD-10-CM | POA: Diagnosis not present

## 2020-01-17 DIAGNOSIS — M25612 Stiffness of left shoulder, not elsewhere classified: Secondary | ICD-10-CM | POA: Diagnosis not present

## 2020-01-17 DIAGNOSIS — M25512 Pain in left shoulder: Secondary | ICD-10-CM | POA: Diagnosis not present

## 2020-01-18 ENCOUNTER — Other Ambulatory Visit: Payer: Self-pay

## 2020-01-18 ENCOUNTER — Ambulatory Visit (INDEPENDENT_AMBULATORY_CARE_PROVIDER_SITE_OTHER): Payer: Medicare Other | Admitting: Family Medicine

## 2020-01-18 ENCOUNTER — Encounter: Payer: Self-pay | Admitting: Family Medicine

## 2020-01-18 DIAGNOSIS — M542 Cervicalgia: Secondary | ICD-10-CM

## 2020-01-18 MED ORDER — BACLOFEN 5 MG PO TABS: 5.0000 mg | ORAL_TABLET | Freq: Three times a day (TID) | ORAL | 0 refills | Status: DC | PRN

## 2020-01-18 NOTE — Assessment & Plan Note (Signed)
Patient with pain to palpation of the right sternocleidomastoid.  This seems consistent with the pain she has been feeling at home.  Muscles feel very taut as well.  Pain seems to be resolved with heat as well which is consistent with muscle strain.  I advised continuing heat.  Will give short course of baclofen for muscle relaxant.  Patient advised to do stretching daily.  Patient to follow-up with PCP in 2 weeks, sooner if worsening.  Hopeful that with stretching exercises patient's pain will improve.

## 2020-01-18 NOTE — Patient Instructions (Signed)

## 2020-01-18 NOTE — Progress Notes (Signed)
    SUBJECTIVE:   CHIEF COMPLAINT / HPI:   Neck pain Patient presenting for neck pain that started Saturday/Sunday. Initially started on left side. Yesterday got worse and now radiates to right. Denies h/o trauma or injury. On 3/11 she had left shoulder replacement, initially thought referred pain as she is doing PT. Reports no change in her movement. Is able to move neck but restricted in right turning. No new headaches. Usually sleeps on both sides. Usually turns sides after she goes to the bathroom. Pain is improved with "pain pills" which she had from her shoulder injury. Heating pad also improved pain. Moving makes pain worse. Reports it feels "sore" more of an ache than it is pain.    PERTINENT  PMH / PSH: HTN, T2DM, hypothyroidism, HLD, s/p shoulder replacement   OBJECTIVE:   BP 140/68   Pulse 66   Ht 5\' 7"  (1.702 m)   Wt 252 lb (114.3 kg)   SpO2 99%   BMI 39.47 kg/m   Gen: awake and alert, NAD HEENT: conjunctiva normal Neck: limited ROM in turning to right, tender to palpation of right SCM, tight musculature of SCM, pain reproduced with palpation of SCM, normal cervical vertebra ASSESSMENT/PLAN:   Neck pain Patient with pain to palpation of the right sternocleidomastoid.  This seems consistent with the pain she has been feeling at home.  Muscles feel very taut as well.  Pain seems to be resolved with heat as well which is consistent with muscle strain.  I advised continuing heat.  Will give short course of baclofen for muscle relaxant.  Patient advised to do stretching daily.  Patient to follow-up with PCP in 2 weeks, sooner if worsening.  Hopeful that with stretching exercises patient's pain will improve.     Caroline More, [place]

## 2020-01-19 ENCOUNTER — Telehealth: Payer: Self-pay

## 2020-01-19 ENCOUNTER — Other Ambulatory Visit: Payer: Self-pay

## 2020-01-19 ENCOUNTER — Encounter: Payer: Self-pay | Admitting: Family Medicine

## 2020-01-19 ENCOUNTER — Ambulatory Visit (INDEPENDENT_AMBULATORY_CARE_PROVIDER_SITE_OTHER): Payer: Medicare Other | Admitting: Family Medicine

## 2020-01-19 DIAGNOSIS — M542 Cervicalgia: Secondary | ICD-10-CM

## 2020-01-19 MED ORDER — OXYCODONE-ACETAMINOPHEN 5-325 MG PO TABS: 1.0000 | ORAL_TABLET | Freq: Four times a day (QID) | ORAL | 0 refills | Status: DC | PRN

## 2020-01-19 NOTE — Telephone Encounter (Signed)
If patient is having worsening pain she needs to be seen. Muscle spasm should improve with a muscle relaxant. I advised her yesterday to f/u in 2 week, sooner if worsening pain.   Dalphine Handing, PGY-3 [facility] 01/19/2020 10:17 AM

## 2020-01-19 NOTE — Progress Notes (Signed)
    SUBJECTIVE:   CHIEF COMPLAINT / HPI:   Neck pain Patient reports that she continues to have neck pain, it is now worsening and radiates to right shoulder. Patient is taking baclofen, bid, and reports no improvement. Using heating pads "a lot". Heating pads are helping. Does not resolve the pain but it eases off the pain. Denies trauma or other injury since yesterday. Vision is normal. No dizziness/LH, no syncope. No knew headaches.   PERTINENT  PMH / PSH: HTN, T2DM, hypothyroidism, HLD, s/p shoulder replacement   OBJECTIVE:   BP 122/72   Pulse 90   Wt 247 lb (112 kg)   SpO2 95%   BMI 38.69 kg/m   Gen: awake and alert, NAD Neck: limited ROM 2/2 pain, tender to palpation of right SCM, tight musculature of SCM, pain reproduced with palpation of SCM, no pain with palpation of vertebra   ASSESSMENT/PLAN:   Neck pain Continues to have pain of right SCM. Seems consistent with musculoskeletal pain. Pain is improved with heating pads. Will give short course of percocet for acute pain. Advised close f/u in clinic within 2 weeks, sooner if worsening. Advised to continue stretching.      Caroline More, [place]

## 2020-01-19 NOTE — Assessment & Plan Note (Signed)
Continues to have pain of right SCM. Seems consistent with musculoskeletal pain. Pain is improved with heating pads. Will give short course of percocet for acute pain. Advised close f/u in clinic within 2 weeks, sooner if worsening. Advised to continue stretching.

## 2020-01-19 NOTE — Patient Instructions (Signed)

## 2020-01-19 NOTE — Telephone Encounter (Signed)
Patient calls nurse line following up from office visit yesterday. Patient states that pain has not improved at all with addition of muscle relaxer. Patient is requesting pain medication be sent to pharmacy. Patient reports that she believes she is having muscle spasms in her neck. Patient rates pain at 10/10. Patient reports that heating pad still provides some relief, however, once she removes the pain returns instantly. Patient states that she can barely turn her head from side to side. Onset on Sunday evening, pain worsening each day. Patient denies difficulty breathing, swallowing, headaches, blurred vision and radiation of pain.    Patient would like to use [facility] on [address] if pain medication is to be sent in.   Please advise next steps for patient.   ED precautions given.   Talbot Grumbling, RN

## 2020-01-19 NOTE — Telephone Encounter (Signed)
Patient called and rescheduled for this afternoon with Dr. Tammi Klippel.   Talbot Grumbling, RN

## 2020-01-20 ENCOUNTER — Other Ambulatory Visit: Payer: Self-pay

## 2020-01-20 ENCOUNTER — Ambulatory Visit (INDEPENDENT_AMBULATORY_CARE_PROVIDER_SITE_OTHER): Payer: Medicare Other | Admitting: Family Medicine

## 2020-01-20 VITALS — BP 104/62 | HR 80 | Ht 67.0 in | Wt 245.0 lb

## 2020-01-20 DIAGNOSIS — M546 Pain in thoracic spine: Secondary | ICD-10-CM | POA: Diagnosis not present

## 2020-01-20 MED ORDER — KETOROLAC TROMETHAMINE 30 MG/ML IJ SOLN
30.0000 mg | Freq: Once | INTRAMUSCULAR | Status: AC
  Administered 2020-01-20: 30 mg via INTRAMUSCULAR

## 2020-01-20 NOTE — Patient Instructions (Signed)
Please go and get an xray of your back.   You may take naproxen alternating with oxycodone for pain.   If you pain becomes unbearable or you develop muscle weakness, you make go to ED, where they can do more pain control and further imaging.

## 2020-01-20 NOTE — Telephone Encounter (Signed)
Patient returns call to nurse line stating that neck pain has not gotten any better with addition of percocet. Patient reports that pain has now radiated to L side of back. Patient states the only relief she has is when she is using heat pack.   Scheduled for this afternoon, per [place] for possible trigger point injections.   Talbot Grumbling, RN

## 2020-01-20 NOTE — Assessment & Plan Note (Addendum)
Focal thoracic midline back pain.  Patient has been to the office 2 other times in the past 3 days.  Without history of trauma, concern for possible fragility fracture given she is postmenopausal.  No suspicion of C-spine abnormalities based on exam. -Toradol -Thoracic x-ray -May continue oxycodone, baclofen and naproxen for pain -May go to ED if needs further pain control

## 2020-01-20 NOTE — Progress Notes (Signed)
    SUBJECTIVE:   CHIEF COMPLAINT / HPI:   BACK PAIN  Patient states that she had neck pain 4 days.  It started on right, then moved to the left.  She has been seen in clinic twice for the past 2 days.  She has tried baclofen, oxycodone for pain but no improvement..  Reports that her neck no longer hurts, but now reports back pain in thoracic area.  She denies trauma.  Says the pain is 10/10.  It is sharp pain and constant, it is worse when it is especially when massaged, but then massaged will help the pain get better.  The pain does not radiate.  She has a history of h/o of L3 surgery for bulging disc, but did not have any pain.  Prior history of similar pain: N History of cancer: N Weak immune system:  N History of IV drug use: N History of steroid use: N  Symptoms Incontinence of bowel or bladder:  N Numbness of leg: N Fever: N Rest or Night pain: N Weight Loss:  N. Reportedly has had 7 lbs loss over the past 3 days, But has not had any vomiting or diarrhea. Seems likely a scale error.  Rash: N  DEXA scan in 2006 showed no osteoporosis. Normal Cr in 10/2019.   OBJECTIVE:   BP 104/62   Pulse 80   Ht 5\' 7"  (1.702 m)   Wt 245 lb (111.1 kg)   SpO2 99%   BMI 38.37 kg/m   Gen: appears uncomfortable Neck: no cervical lymphadopathy, midline Cspine nontender, no SCM tenderness MSK: bilateral shoulders are nontender, midline Tspine tenderness 2 inches down from C7, 5/5 upper extremity strength throughout, normal sensation, bilaterally in upper extremities, normal strength in fingers, no contusion or deformity or stepoff in thoracic back Neuro: grossly normal, moves all extremities   ASSESSMENT/PLAN:   Acute midline thoracic back pain Focal thoracic midline back pain.  Patient has been to the office 2 other times in the past 3 days.  Without history of trauma, concern for possible fragility fracture given she is postmenopausal.  No suspicion of C-spine abnormalities based on  exam. -Toradol -Thoracic x-ray -May continue oxycodone, baclofen and naproxen for pain -May go to ED if needs further pain control     Kaitlyn Hollow, MD [place]

## 2020-01-25 ENCOUNTER — Other Ambulatory Visit: Payer: Self-pay

## 2020-01-25 DIAGNOSIS — E119 Type 2 diabetes mellitus without complications: Secondary | ICD-10-CM

## 2020-01-25 MED ORDER — TRESIBA FLEXTOUCH 100 UNIT/ML ~~LOC~~ SOPN: PEN_INJECTOR | SUBCUTANEOUS | 1 refills | Status: DC

## 2020-01-25 NOTE — Telephone Encounter (Signed)
Patient calls nurse line stating she needs a refill on Tresiba. Patient reports she is now taking 70 units verses 60 units. I have updated the prescription. Please advise on refill.

## 2020-01-27 ENCOUNTER — Other Ambulatory Visit: Payer: Self-pay | Admitting: Family Medicine

## 2020-01-28 DIAGNOSIS — Z96612 Presence of left artificial shoulder joint: Secondary | ICD-10-CM | POA: Diagnosis not present

## 2020-01-28 DIAGNOSIS — M542 Cervicalgia: Secondary | ICD-10-CM | POA: Diagnosis not present

## 2020-01-28 DIAGNOSIS — Z471 Aftercare following joint replacement surgery: Secondary | ICD-10-CM | POA: Diagnosis not present

## 2020-02-01 ENCOUNTER — Encounter (INDEPENDENT_AMBULATORY_CARE_PROVIDER_SITE_OTHER): Payer: Self-pay | Admitting: Ophthalmology

## 2020-02-01 ENCOUNTER — Other Ambulatory Visit: Payer: Self-pay

## 2020-02-01 ENCOUNTER — Ambulatory Visit (INDEPENDENT_AMBULATORY_CARE_PROVIDER_SITE_OTHER): Payer: Medicare Other | Admitting: Ophthalmology

## 2020-02-01 DIAGNOSIS — H35371 Puckering of macula, right eye: Secondary | ICD-10-CM | POA: Diagnosis not present

## 2020-02-01 DIAGNOSIS — H40031 Anatomical narrow angle, right eye: Secondary | ICD-10-CM | POA: Diagnosis not present

## 2020-02-01 DIAGNOSIS — H35722 Serous detachment of retinal pigment epithelium, left eye: Secondary | ICD-10-CM | POA: Insufficient documentation

## 2020-02-01 DIAGNOSIS — H353221 Exudative age-related macular degeneration, left eye, with active choroidal neovascularization: Secondary | ICD-10-CM | POA: Insufficient documentation

## 2020-02-01 HISTORY — DX: Anatomical narrow angle, right eye: H40.031

## 2020-02-01 MED ORDER — BEVACIZUMAB CHEMO INJECTION 1.25MG/0.05ML SYRINGE FOR KALEIDOSCOPE
1.2500 mg | INTRAVITREAL | Status: AC | PRN
  Administered 2020-02-01: 1.25 mg via INTRAVITREAL

## 2020-02-01 NOTE — Progress Notes (Signed)
02/01/2020     CHIEF COMPLAINT Patient presents for Retina Follow Up   HISTORY OF PRESENT ILLNESS: Kaitlyn Ferguson is a 74 y.o. female who presents to the clinic today for:   HPI    Retina Follow Up    Patient presents with  Wet AMD.  In both eyes.  Severity is moderate.  Duration of 10 weeks.  Since onset it is stable.  I, the attending physician,  performed the HPI with the patient and updated documentation appropriately.          Comments    10 Week AMD f\u OU. Possible Avastin OS. OCT Pt states vision is stable. Denies FOL and floaters. BGL: 208 (currently on prednisone) A1C: 8.1       Last edited by Hurman Horn, MD on 02/01/2020 11:40 AM. (History)      Referring physician: [name], MD [place],  [place] [id number]  HISTORICAL INFORMATION:   Selected notes from the MEDICAL RECORD NUMBER    Lab Results  Component Value Date   HGBA1C 8.1 (H) 10/27/2019     CURRENT MEDICATIONS: No current outpatient medications on file. (Ophthalmic Drugs)   No current facility-administered medications for this visit. (Ophthalmic Drugs)   Current Outpatient Medications (Other)  Medication Sig  . Insulin Pen Needle (B-D UF III MINI PEN NEEDLES) 31G X 5 MM MISC USE TWICE DAILY  . amLODipine (NORVASC) 5 MG tablet Take 1 tablet (5 mg total) by mouth daily.  Marland Kitchen aspirin EC 81 MG tablet Take 1 tablet (81 mg total) by mouth daily.  . Baclofen 5 MG TABS Take 5 mg by mouth 3 (three) times daily as needed (muscle pain).  . Blood Glucose Monitoring Suppl (ONETOUCH VERIO FLEX SYSTEM) w/Device KIT 1 application by Does not apply route 2 (two) times daily.  . Blood Glucose Monitoring Suppl (ONETOUCH VERIO) w/Device KIT Check blood sugars 2 times daily. Code: E11.9  . cholecalciferol (VITAMIN D3) 25 MCG (1000 UNIT) tablet Take 1,000 Units by mouth daily.  Marland Kitchen glucose blood (ONETOUCH VERIO) test strip Use to check blood sugar twice daily or as directed. Code:  E11.9  . insulin degludec (TRESIBA FLEXTOUCH) 100 UNIT/ML FlexTouch Pen INJECT 70 UNITS UNDER THE SKIN ONCE DAILY AS DIRECTED  . Lancet Devices (ONE TOUCH DELICA LANCING DEV) MISC Check blood sugars 2 times daily. Code: E11.9  . Lancets (FREESTYLE) lancets 1 each by Other route 2 (two) times daily. Use as instructed  . Lancets (ONETOUCH DELICA PLUS [id number]) MISC 1 each by Does not apply route 2 (two) times daily.  Marland Kitchen levothyroxine (SYNTHROID) 88 MCG tablet TAKE 1 TABLET BY MOUTH EVERY DAY BEFORE BREAKFAST (Patient taking differently: Take 88 mcg by mouth daily before breakfast. )  . liraglutide (VICTOZA) 18 MG/3ML SOPN inject 0.3 milliliters (1.8 MG TOTAL) subcutaneously once daily (Patient taking differently: Inject 1.8 mg into the skin daily. )  . lisinopril-hydrochlorothiazide (ZESTORETIC) 20-12.5 MG tablet Take 1 tablet by mouth daily.  . metFORMIN (GLUCOPHAGE) 500 MG tablet Take 1.5 tablets (750 mg total) by mouth 2 (two) times daily with a meal.  . Multiple Vitamin (MULTIVITAMIN WITH MINERALS) TABS tablet Take 1 tablet by mouth daily. With Vitamin D3  . naproxen (NAPROSYN) 500 MG tablet Take 1 tablet (500 mg total) by mouth 2 (two) times daily with a meal.  . omeprazole (PRILOSEC) 40 MG capsule Take 1 capsule (40 mg total) by mouth daily.  . ondansetron (ZOFRAN) 4 MG tablet  Take 1 tablet (4 mg total) by mouth every 8 (eight) hours as needed for nausea or vomiting.  Marland Kitchen oxyCODONE-acetaminophen (PERCOCET) 5-325 MG tablet Take 1 tablet by mouth every 6 (six) hours as needed (max 6 q).  . rosuvastatin (CRESTOR) 20 MG tablet TAKE 1 TABLET(20 MG) BY MOUTH EVERY EVENING   No current facility-administered medications for this visit. (Other)      REVIEW OF SYSTEMS: ROS    Positive for: Endocrine   Last edited by Tilda Franco on 02/01/2020 10:55 AM. (History)       ALLERGIES Allergies  Allergen Reactions  . Gabapentin Nausea Only  . Vancomycin Itching    Pre-medicated with benadryl  and tylenol 1 hr before administration of vanc    PAST MEDICAL HISTORY Past Medical History:  Diagnosis Date  . Arthritis   . Diabetes mellitus   . DJD (degenerative joint disease) 10/07/2008  . DJD (degenerative joint disease)   . Family history of adverse reaction to anesthesia    MOTHER WAS DIFFICULT TO WAKE UP  . Hyperlipidemia   . Hypertension   . Hypothyroidism   . Nausea    INTERMITANT - TAKES PRILOSEC (DENIES REFLUX)  . Obesity   . Osteomyelitis of ankle (HCC)    Complications of Right ankle injury   Past Surgical History:  Procedure Laterality Date  . ABDOMINAL HYSTERECTOMY    . ANKLE FUSION  02/1996   Right ankle tib/talar  . BACK SURGERY  1984  . BREAST EXCISIONAL BIOPSY Right   . CARPAL TUNNEL RELEASE  1993   Right arm  . CHOLECYSTECTOMY    . EYE SURGERY Left   . NECK MASS EXCISION  12/29/2001  . right eye laser    . TONSILLECTOMY     Childhood  . TOTAL KNEE ARTHROPLASTY Left 12/21/2015   Procedure: LEFT TOTAL KNEE ARTHROPLASTY WITH COMPLEX COMPUTER NAVIGATION ;  Surgeon: Rod Can, MD;  Location: [facility];  Service: Orthopedics;  Laterality: Left;  . TOTAL SHOULDER ARTHROPLASTY Right 07/11/2016   Procedure: RIGHT TOTAL SHOULDER ARTHROPLASTY;  Surgeon: Justice Britain, MD;  Location: [facility];  Service: Orthopedics;  Laterality: Right;  . TOTAL SHOULDER ARTHROPLASTY Left 11/04/2019   Procedure: TOTAL SHOULDER ARTHROPLASTY;  Surgeon: Justice Britain, MD;  Location: [facility];  Service: Orthopedics;  Laterality: Left;  153mn    FAMILY HISTORY Family History  Problem Relation Age of Onset  . Diabetes Brother   . Diabetes Sister        DM2-lost weight-now okay  . Heart attack Father        First MI- early 579's . Stroke Mother   . Heart failure Mother   . Multiple myeloma Mother        Deceased at 751due to same  . Breast cancer Maternal Aunt   . Heart attack Son     SOCIAL HISTORY Social History   Tobacco Use  . Smoking status: Never Smoker  .  Smokeless tobacco: Never Used  . Tobacco comment: No tobacco currently -   Substance Use Topics  . Alcohol use: No    Alcohol/week: 0.0 standard drinks  . Drug use: No         OPHTHALMIC EXAM:  Base Eye Exam    Visual Acuity (Snellen - Linear)      Right Left   Dist [place] 20/60 -1 20/400   Dist ph [place] 20/25 -2 20/50 +       Tonometry (Tonopen, 11:01 AM)  Right Left   Pressure 15 15       Pupils      Pupils Dark Light Shape React APD   Right PERRL 4 3 Round Brisk None   Left PERRL 4 3 Round Brisk None       Visual Fields (Counting fingers)      Left Right    Full Full       Neuro/Psych    Oriented x3: Yes   Mood/Affect: Normal       Dilation    Both eyes: 1.0% Mydriacyl, 2.5% Phenylephrine @ 11:02 AM        Slit Lamp and Fundus Exam    External Exam      Right Left   External Normal Normal       Fundus Exam      Right Left   Posterior Vitreous Posterior vitreous detachment Posterior vitreous detachment   Disc Normal Normal   C/D Ratio 0.2 0.35   Macula Epiretinal membrane Intraretinal hemorrhage, Retinal pigment epithelial mottling   Vessels Normal Normal   Periphery Normal Normal          IMAGING AND PROCEDURES  Imaging and Procedures for 02/01/20  OCT, Retina - OU - Both Eyes       Right Eye Quality was good. Scan locations included subfoveal. Central Foveal Thickness: 277. Progression has been stable. Findings include epiretinal membrane.   Left Eye Quality was good. Scan locations included subfoveal. Central Foveal Thickness: 393. Findings include abnormal foveal contour.   Notes OD, with severe epiretinal membrane topographic distortion although this is stable over many years no interval change   OS with evidence of subfoveal CNVM, RPE rip, chronic active lesion with subretinal fluid, currently at 10-week exam interval and stable       Intravitreal Injection, Pharmacologic Agent - OS - Left Eye       Time Out 02/01/2020.  12:03 PM. Confirmed correct patient, procedure, site, and patient consented.   Anesthesia Topical anesthesia was used. Anesthetic medications included Akten 3.5%.   Procedure Preparation included Ofloxacin . A 30 gauge needle was used.   Injection:  1.25 mg Bevacizumab (AVASTIN) SOLN   NDC: [address]-7, Lot: [id number]   Route: Intravitreal, Site: Left Eye, Waste: 0 mg  Post-op Post injection exam found visual acuity of at least counting fingers. The patient tolerated the procedure well. There were no complications. The patient received written and verbal post procedure care education.                 ASSESSMENT/PLAN:  Right epiretinal membrane The nature of macular pucker (epiretinal membrane ERM) was discussed with the patient as well as threshold criteria for vitrectomy surgery. I explained that in rare cases another surgery is needed to actually remove a second wrinkle should it regrow.  Most often, the epiretinal membrane and underlying wrinkled internal limiting membrane are removed with the first surgery, to accomplish the goals.   If the operative eye is Phakic (natural lens still present), cataract surgery is often recommended prior to Vitrectomy. This will enable the retina surgeon to have the best view during surgery and the patient to obtain optimal results in the future. Treatment options were discussed.  Stable over time observe  Exudative age-related macular degeneration of left eye with active choroidal neovascularization (HCC) The nature of wet macular degeneration was discussed with the patient.  Forms of therapy reviewed include the use of Anti-VEGF medications injected painlessly into the eye, as well as other  possible treatment modalities, including thermal laser therapy. Fellow eye involvement and risks were discussed with the patient. Upon the finding of wet age related macular degeneration, treatment will be offered. The treatment regimen is on a treat as needed  basis with the intent to treat if necessary and extend interval of exams when possible. On average 1 out of 6 patients do not need lifetime therapy. However, the risk of recurrent disease is high for a lifetime.  Initially monthly, then periodic, examinations and evaluations will determine whether the next treatment is required on the day of the examination.,  Chronically active due to RPE rip and chronic serous retinal detachment.  Goal is to maintain stability of the lesion currently on 10-week exam interval repeat intravitreal Avastin today      ICD-10-CM   1. Exudative age-related macular degeneration of left eye with active choroidal neovascularization (HCC)  H35.3221 OCT, Retina - OU - Both Eyes    Intravitreal Injection, Pharmacologic Agent - OS - Left Eye    Bevacizumab (AVASTIN) SOLN 1.25 mg  2. Serous detachment of retinal pigment epithelium of left eye  H35.722   3. Right epiretinal membrane  H35.371 OCT, Retina - OU - Both Eyes  4. Anatomical narrow angle glaucoma with borderline intraocular pressure, right  H40.031     1.OS with evidence of subfoveal CNVM, RPE rip, chronic active lesion with subretinal fluid, currently at 10-week exam interval and stable  2.  3.  Ophthalmic Meds Ordered this visit:  Meds ordered this encounter  Medications  . Bevacizumab (AVASTIN) SOLN 1.25 mg       Return in about 10 weeks (around 04/11/2020) for OS, dilate, AVASTIN OCT.  There are no Patient Instructions on file for this visit.   Explained the diagnoses, plan, and follow up with the patient and they expressed understanding.  Patient expressed understanding of the importance of proper follow up care.   Clent Demark Daleiza Bacchi M.D. Diseases & Surgery of the Retina and Vitreous [facility] [place] 02/01/20     Abbreviations: M myopia (nearsighted); A astigmatism; H hyperopia (farsighted); P presbyopia; Mrx spectacle prescription;  CTL contact lenses; OD right eye; OS left eye; OU  both eyes  XT exotropia; ET esotropia; PEK punctate epithelial keratitis; PEE punctate epithelial erosions; DES dry eye syndrome; MGD meibomian gland dysfunction; ATs artificial tears; PFAT's preservative free artificial tears; [place] nuclear sclerotic cataract; PSC posterior subcapsular cataract; ERM epi-retinal membrane; PVD posterior vitreous detachment; RD retinal detachment; DM diabetes mellitus; DR diabetic retinopathy; NPDR non-proliferative diabetic retinopathy; PDR proliferative diabetic retinopathy; CSME clinically significant macular edema; DME diabetic macular edema; dbh dot blot hemorrhages; CWS cotton wool spot; POAG primary open angle glaucoma; C/D cup-to-disc ratio; HVF humphrey visual field; GVF goldmann visual field; OCT optical coherence tomography; IOP intraocular pressure; BRVO Branch retinal vein occlusion; CRVO central retinal vein occlusion; CRAO central retinal artery occlusion; BRAO branch retinal artery occlusion; RT retinal tear; SB scleral buckle; PPV pars plana vitrectomy; VH Vitreous hemorrhage; PRP panretinal laser photocoagulation; IVK intravitreal kenalog; VMT vitreomacular traction; MH Macular hole;  NVD neovascularization of the disc; NVE neovascularization elsewhere; AREDS age related eye disease study; ARMD age related macular degeneration; POAG primary open angle glaucoma; EBMD epithelial/anterior basement membrane dystrophy; ACIOL anterior chamber intraocular lens; IOL intraocular lens; PCIOL posterior chamber intraocular lens; Phaco/IOL phacoemulsification with intraocular lens placement; [place] photorefractive keratectomy; LASIK laser assisted in situ keratomileusis; HTN hypertension; DM diabetes mellitus; COPD chronic obstructive pulmonary disease

## 2020-02-01 NOTE — Assessment & Plan Note (Signed)
The nature of wet macular degeneration was discussed with the patient.  Forms of therapy reviewed include the use of Anti-VEGF medications injected painlessly into the eye, as well as other possible treatment modalities, including thermal laser therapy. Fellow eye involvement and risks were discussed with the patient. Upon the finding of wet age related macular degeneration, treatment will be offered. The treatment regimen is on a treat as needed basis with the intent to treat if necessary and extend interval of exams when possible. On average 1 out of 6 patients do not need lifetime therapy. However, the risk of recurrent disease is high for a lifetime.  Initially monthly, then periodic, examinations and evaluations will determine whether the next treatment is required on the day of the examination.,  Chronically active due to RPE rip and chronic serous retinal detachment.  Goal is to maintain stability of the lesion currently on 10-week exam interval repeat intravitreal Avastin today

## 2020-02-01 NOTE — Assessment & Plan Note (Signed)
The nature of macular pucker (epiretinal membrane ERM) was discussed with the patient as well as threshold criteria for vitrectomy surgery. I explained that in rare cases another surgery is needed to actually remove a second wrinkle should it regrow.  Most often, the epiretinal membrane and underlying wrinkled internal limiting membrane are removed with the first surgery, to accomplish the goals.   If the operative eye is Phakic (natural lens still present), cataract surgery is often recommended prior to Vitrectomy. This will enable the retina surgeon to have the best view during surgery and the patient to obtain optimal results in the future. Treatment options were discussed.  Stable over time observe

## 2020-02-02 ENCOUNTER — Ambulatory Visit: Payer: Medicare Other | Admitting: Family Medicine

## 2020-02-07 ENCOUNTER — Other Ambulatory Visit: Payer: Self-pay

## 2020-02-07 MED ORDER — LISINOPRIL-HYDROCHLOROTHIAZIDE 20-12.5 MG PO TABS: 1.0000 | ORAL_TABLET | Freq: Every day | ORAL | 3 refills | Status: DC

## 2020-02-09 ENCOUNTER — Other Ambulatory Visit: Payer: Self-pay

## 2020-02-09 DIAGNOSIS — M436 Torticollis: Secondary | ICD-10-CM | POA: Diagnosis not present

## 2020-02-09 MED ORDER — BACLOFEN 5 MG PO TABS: 5.0000 mg | ORAL_TABLET | Freq: Three times a day (TID) | ORAL | 6 refills | Status: DC | PRN

## 2020-02-16 DIAGNOSIS — M5030 Other cervical disc degeneration, unspecified cervical region: Secondary | ICD-10-CM | POA: Diagnosis not present

## 2020-03-02 ENCOUNTER — Ambulatory Visit (INDEPENDENT_AMBULATORY_CARE_PROVIDER_SITE_OTHER): Payer: Medicare Other | Admitting: Family Medicine

## 2020-03-02 ENCOUNTER — Other Ambulatory Visit: Payer: Self-pay

## 2020-03-02 ENCOUNTER — Other Ambulatory Visit: Payer: Self-pay | Admitting: Family Medicine

## 2020-03-02 ENCOUNTER — Encounter: Payer: Self-pay | Admitting: Family Medicine

## 2020-03-02 VITALS — BP 112/60 | HR 66 | Ht 67.0 in | Wt 247.6 lb

## 2020-03-02 DIAGNOSIS — M542 Cervicalgia: Secondary | ICD-10-CM | POA: Diagnosis not present

## 2020-03-02 DIAGNOSIS — I10 Essential (primary) hypertension: Secondary | ICD-10-CM | POA: Diagnosis not present

## 2020-03-02 DIAGNOSIS — E785 Hyperlipidemia, unspecified: Secondary | ICD-10-CM | POA: Diagnosis not present

## 2020-03-02 DIAGNOSIS — E119 Type 2 diabetes mellitus without complications: Secondary | ICD-10-CM

## 2020-03-02 DIAGNOSIS — S161XXA Strain of muscle, fascia and tendon at neck level, initial encounter: Secondary | ICD-10-CM

## 2020-03-02 LAB — POCT GLYCOSYLATED HEMOGLOBIN (HGB A1C): HbA1c, POC (controlled diabetic range): 7.6 % — AB (ref 0.0–7.0)

## 2020-03-02 MED ORDER — DICLOFENAC SODIUM 1 % EX GEL: 2.0000 g | Freq: Four times a day (QID) | CUTANEOUS | 12 refills | Status: DC

## 2020-03-02 MED ORDER — ROSUVASTATIN CALCIUM 10 MG PO TABS: 10.0000 mg | ORAL_TABLET | Freq: Every day | ORAL | 3 refills | Status: DC

## 2020-03-02 NOTE — Patient Instructions (Signed)
I am delighted with you.  It is great that your neck is better with exercises.  Remember that Toradal is a good pain medicine for you. I sent in some gel for your neck.  The exercises and gel work well together. I am also glad you diabetes is improved.  Some is the medicine and some is the weight loss.  Please keep working on that weight loss. I cut bak your crestor to 10 mg.  See if you notice a change in the cramps. I will call with the blood results. See me in three months.

## 2020-03-03 ENCOUNTER — Telehealth: Payer: Self-pay | Admitting: Family Medicine

## 2020-03-03 ENCOUNTER — Encounter: Payer: Self-pay | Admitting: Family Medicine

## 2020-03-03 LAB — BASIC METABOLIC PANEL
BUN/Creatinine Ratio: 14 (ref 12–28)
BUN: 12 mg/dL (ref 8–27)
CO2: 27 mmol/L (ref 20–29)
Calcium: 9.6 mg/dL (ref 8.7–10.3)
Chloride: 99 mmol/L (ref 96–106)
Creatinine, Ser: 0.83 mg/dL (ref 0.57–1.00)
GFR calc Af Amer: 81 mL/min/{1.73_m2} (ref >59)
GFR calc non Af Amer: 70 mL/min/{1.73_m2} (ref >59)
Glucose: 72 mg/dL (ref 65–99)
Potassium: 4.3 mmol/L (ref 3.5–5.2)
Sodium: 142 mmol/L (ref 134–144)

## 2020-03-03 NOTE — Assessment & Plan Note (Signed)
Good control

## 2020-03-03 NOTE — Assessment & Plan Note (Signed)
Markedly improved.  Continue neck exercises.  Add voltaren gel.

## 2020-03-03 NOTE — Assessment & Plan Note (Signed)
Doing well on current meds.  Check potassium for the cramps.

## 2020-03-03 NOTE — Telephone Encounter (Signed)
Called and informed results normal

## 2020-03-03 NOTE — Assessment & Plan Note (Signed)
Will try lower dose given very low LDL.

## 2020-03-03 NOTE — Progress Notes (Signed)
    SUBJECTIVE:   CHIEF COMPLAINT / HPI:   FU several problems 1. Neck pain.  Patient really had an excellent response to the toradal injection.  She also did some short term PT and continues to do the exercises they taught her.  Minimal pain 2. DM.  Eating better and more active.  She has had some weight loss.  A1C is nicely down to 7.6.  Tolerating her various meds well.  No hypoglycemic sx. 3. Having some leg cramps at night.  Wonders if it is her crestor.  She has been on crestor "for years."  On lisinopril/HCTZ for HBP.  No recent potassium    OBJECTIVE:   BP 112/60   Pulse 66   Ht 5\' 7"  (1.702 m)   Wt 247 lb 9.6 oz (112.3 kg)   SpO2 97%   BMI 38.78 kg/m   Lung clear Cardiac RRR without m or g Good strength and sensation both upper ext.  ASSESSMENT/PLAN:   Primary hypertension Doing well on current meds.  Check potassium for the cramps.  Hyperlipidemia Will try lower dose given very low LDL.  Diabetes mellitus without complication ([place]) Good control  Cervical strain Markedly improved.  Continue neck exercises.  Add voltaren gel.     [name], MD [place]

## 2020-03-27 ENCOUNTER — Encounter (INDEPENDENT_AMBULATORY_CARE_PROVIDER_SITE_OTHER): Payer: Self-pay

## 2020-03-27 DIAGNOSIS — H40021 Open angle with borderline findings, high risk, right eye: Secondary | ICD-10-CM | POA: Diagnosis not present

## 2020-03-27 DIAGNOSIS — H401122 Primary open-angle glaucoma, left eye, moderate stage: Secondary | ICD-10-CM | POA: Diagnosis not present

## 2020-03-27 DIAGNOSIS — E119 Type 2 diabetes mellitus without complications: Secondary | ICD-10-CM | POA: Diagnosis not present

## 2020-03-27 DIAGNOSIS — H353111 Nonexudative age-related macular degeneration, right eye, early dry stage: Secondary | ICD-10-CM | POA: Diagnosis not present

## 2020-03-27 DIAGNOSIS — H353221 Exudative age-related macular degeneration, left eye, with active choroidal neovascularization: Secondary | ICD-10-CM | POA: Diagnosis not present

## 2020-04-11 ENCOUNTER — Encounter (INDEPENDENT_AMBULATORY_CARE_PROVIDER_SITE_OTHER): Payer: Medicare Other | Admitting: Ophthalmology

## 2020-04-12 ENCOUNTER — Other Ambulatory Visit: Payer: Self-pay

## 2020-04-12 ENCOUNTER — Encounter (INDEPENDENT_AMBULATORY_CARE_PROVIDER_SITE_OTHER): Payer: Self-pay | Admitting: Ophthalmology

## 2020-04-12 ENCOUNTER — Ambulatory Visit (INDEPENDENT_AMBULATORY_CARE_PROVIDER_SITE_OTHER): Payer: Medicare Other | Admitting: Ophthalmology

## 2020-04-12 DIAGNOSIS — H353221 Exudative age-related macular degeneration, left eye, with active choroidal neovascularization: Secondary | ICD-10-CM

## 2020-04-12 MED ORDER — BEVACIZUMAB CHEMO INJECTION 1.25MG/0.05ML SYRINGE FOR KALEIDOSCOPE
1.2500 mg | INTRAVITREAL | Status: AC | PRN
Start: 2020-04-12 — End: 2020-04-12
  Administered 2020-04-12: 1.25 mg via INTRAVITREAL

## 2020-04-12 NOTE — Progress Notes (Signed)
04/12/2020     CHIEF COMPLAINT Patient presents for Retina Follow Up   HISTORY OF PRESENT ILLNESS: Kaitlyn Ferguson is a 74 y.o. female who presents to the clinic today for:   HPI    Retina Follow Up    Patient presents with  Wet AMD.  In left eye.  This started 10 weeks ago.  Severity is mild.  Duration of 10 weeks.  Since onset it is stable.          Comments    10 Week AMD F/U OS, poss Avastin OS  Pt denies noticeable changes to [place] OU since last visit. Pt denies ocular pain, flashes of light, or floaters OU.         Last edited by Rockie Neighbours, [place] on 04/12/2020  9:52 AM. (History)      Referring physician: [name], [place],  [place] [address]  HISTORICAL INFORMATION:   Selected notes from the MEDICAL RECORD NUMBER    Lab Results  Component Value Date   HGBA1C 7.6 (A) 03/02/2020     CURRENT MEDICATIONS: No current outpatient medications on file. (Ophthalmic Drugs)   No current facility-administered medications for this visit. (Ophthalmic Drugs)   Current Outpatient Medications (Other)  Medication Sig  . Insulin Pen Needle (B-D UF III MINI PEN NEEDLES) 31G X 5 MM MISC USE TWICE DAILY  . amLODipine (NORVASC) 5 MG tablet Take 1 tablet (5 mg total) by mouth daily.  Marland Kitchen aspirin EC 81 MG tablet Take 1 tablet (81 mg total) by mouth daily.  . Blood Glucose Monitoring Suppl (ONETOUCH VERIO FLEX SYSTEM) w/Device KIT 1 application by Does not apply route 2 (two) times daily.  . Blood Glucose Monitoring Suppl (ONETOUCH VERIO) w/Device KIT Check blood sugars 2 times daily. Code: E11.9  . cholecalciferol (VITAMIN D3) 25 MCG (1000 UNIT) tablet Take 1,000 Units by mouth daily.  . diclofenac Sodium (VOLTAREN) 1 % GEL Apply 2 g topically 4 (four) times daily.  Marland Kitchen glucose blood (ONETOUCH VERIO) test strip Use to check blood sugar twice daily or as directed. Code: E11.9  . insulin degludec (TRESIBA FLEXTOUCH) 100 UNIT/ML FlexTouch Pen  INJECT 70 UNITS UNDER THE SKIN ONCE DAILY AS DIRECTED  . Lancet Devices (ONE TOUCH DELICA LANCING DEV) MISC Check blood sugars 2 times daily. Code: E11.9  . Lancets (FREESTYLE) lancets 1 each by Other route 2 (two) times daily. Use as instructed  . Lancets (ONETOUCH DELICA PLUS [id number]) MISC 1 each by Does not apply route 2 (two) times daily.  Marland Kitchen levothyroxine (SYNTHROID) 88 MCG tablet TAKE 1 TABLET BY MOUTH EVERY DAY BEFORE BREAKFAST (Patient taking differently: Take 88 mcg by mouth daily before breakfast. )  . liraglutide (VICTOZA) 18 MG/3ML SOPN inject 0.3 milliliters (1.8 MG TOTAL) subcutaneously once daily (Patient taking differently: Inject 1.8 mg into the skin daily. )  . lisinopril-hydrochlorothiazide (ZESTORETIC) 20-12.5 MG tablet Take 1 tablet by mouth daily.  . metFORMIN (GLUCOPHAGE) 500 MG tablet Take 1.5 tablets (750 mg total) by mouth 2 (two) times daily with a meal.  . Multiple Vitamin (MULTIVITAMIN WITH MINERALS) TABS tablet Take 1 tablet by mouth daily. With Vitamin D3  . omeprazole (PRILOSEC) 40 MG capsule TAKE 1 CAPSULE(40 MG) BY MOUTH DAILY  . ondansetron (ZOFRAN) 4 MG tablet Take 1 tablet (4 mg total) by mouth every 8 (eight) hours as needed for nausea or vomiting.  . rosuvastatin (CRESTOR) 10 MG tablet Take 1 tablet (10 mg total)  by mouth daily.   No current facility-administered medications for this visit. (Other)      REVIEW OF SYSTEMS:    ALLERGIES Allergies  Allergen Reactions  . Gabapentin Nausea Only  . Vancomycin Itching    Pre-medicated with benadryl and tylenol 1 hr before administration of vanc    PAST MEDICAL HISTORY Past Medical History:  Diagnosis Date  . Anatomical narrow angle glaucoma with borderline intraocular pressure, right 02/01/2020  . Arthritis   . Diabetes mellitus   . DJD (degenerative joint disease) 10/07/2008  . DJD (degenerative joint disease)   . Family history of adverse reaction to anesthesia    MOTHER WAS DIFFICULT TO WAKE UP   . Hyperlipidemia   . Hypertension   . Hypothyroidism   . Nausea    INTERMITANT - TAKES PRILOSEC (DENIES REFLUX)  . Obesity   . Osteomyelitis of ankle (HCC)    Complications of Right ankle injury   Past Surgical History:  Procedure Laterality Date  . ABDOMINAL HYSTERECTOMY    . ANKLE FUSION  02/1996   Right ankle tib/talar  . BACK SURGERY  1984  . BREAST EXCISIONAL BIOPSY Right   . CARPAL TUNNEL RELEASE  1993   Right arm  . CHOLECYSTECTOMY    . EYE SURGERY Left   . NECK MASS EXCISION  12/29/2001  . right eye laser    . TONSILLECTOMY     Childhood  . TOTAL KNEE ARTHROPLASTY Left 12/21/2015   Procedure: LEFT TOTAL KNEE ARTHROPLASTY WITH COMPLEX COMPUTER NAVIGATION ;  Surgeon: Rod Can, MD;  Location: [facility];  Service: Orthopedics;  Laterality: Left;  . TOTAL SHOULDER ARTHROPLASTY Right 07/11/2016   Procedure: RIGHT TOTAL SHOULDER ARTHROPLASTY;  Surgeon: Justice Britain, MD;  Location: [facility];  Service: Orthopedics;  Laterality: Right;  . TOTAL SHOULDER ARTHROPLASTY Left 11/04/2019   Procedure: TOTAL SHOULDER ARTHROPLASTY;  Surgeon: Justice Britain, MD;  Location: [facility];  Service: Orthopedics;  Laterality: Left;  139mn    FAMILY HISTORY Family History  Problem Relation Age of Onset  . Diabetes Brother   . Diabetes Sister        DM2-lost weight-now okay  . Heart attack Father        First MI- early 572's . Stroke Mother   . Heart failure Mother   . Multiple myeloma Mother        Deceased at 747due to same  . Breast cancer Maternal Aunt   . Heart attack Son     SOCIAL HISTORY Social History   Tobacco Use  . Smoking status: Never Smoker  . Smokeless tobacco: Never Used  . Tobacco comment: No tobacco currently -   Vaping Use  . Vaping Use: Never used  Substance Use Topics  . Alcohol use: No    Alcohol/week: 0.0 standard drinks  . Drug use: No         OPHTHALMIC EXAM: Base Eye Exam    Visual Acuity (ETDRS)      Right Left   Dist [place] 20/60 -1 20/200    Dist ph [place] 20/25 -2 20/60ecc       Tonometry (Tonopen, 9:53 AM)      Right Left   Pressure 14 11       Pupils      Pupils Dark Light Shape React APD   Right PERRL 4 3 Round Brisk None   Left PERRL 4 3 Round Brisk None       Visual Fields (Counting fingers)  Left Right    Full Full       Extraocular Movement      Right Left    Full Full       Neuro/Psych    Oriented x3: Yes   Mood/Affect: Normal       Dilation    Left eye: 1.0% Mydriacyl, 2.5% Phenylephrine @ 9:56 AM        Slit Lamp and Fundus Exam    External Exam      Right Left   External Normal Normal       Slit Lamp Exam      Right Left   Lids/Lashes Normal Normal   Conjunctiva/Sclera White and quiet White and quiet   Cornea Clear Clear   Anterior Chamber Deep and quiet Deep and quiet   Iris Round and reactive Round and reactive   Anterior Vitreous Normal Normal       Fundus Exam      Right Left   Posterior Vitreous  Posterior vitreous detachment   Disc  Normal   C/D Ratio  0.35   Macula  Intraretinal hemorrhage, Retinal pigment epithelial mottling, Disciform scar   Vessels  Normal   Periphery  Normal          IMAGING AND PROCEDURES  Imaging and Procedures for 04/12/20  OCT, Retina - OU - Both Eyes       Right Eye Quality was good. Scan locations included subfoveal. Central Foveal Thickness: 306. Progression has been stable. Findings include epiretinal membrane.   Left Eye Quality was good. Scan locations included subfoveal. Central Foveal Thickness: 397. Progression has been stable. Findings include abnormal foveal contour.   Notes OD, with severe epiretinal membrane topographic distortion although this is stable over many years no interval change   OS with evidence of subfoveal CNVM, RPE rip, chronic active lesion with subretinal fluid, currently at 10-week exam interval and stable       Intravitreal Injection, Pharmacologic Agent - OS - Left Eye       Time  Out 04/12/2020. 11:10 AM. Confirmed correct patient, procedure, site, and patient consented.   Anesthesia Topical anesthesia was used. Anesthetic medications included [place] 3.5%.   Procedure Preparation included Ofloxacin , Tobramycin 0.3%, 10% betadine to eyelids, 5% betadine to ocular surface. A supplied needle was used.   Injection:  1.25 mg Bevacizumab (AVASTIN) SOLN   NDC: [address]-4, Lot: [id number]   Route: Intravitreal, Site: Left Eye, Waste: 0 mg  Post-op Post injection exam found visual acuity of at least counting fingers. The patient tolerated the procedure well. There were no complications. The patient received written and verbal post procedure care education. Post injection medications were not given.                 ASSESSMENT/PLAN:  Exudative age-related macular degeneration of left eye with active choroidal neovascularization (HCC) The nature of wet macular degeneration was discussed with the patient.  Forms of therapy reviewed include the use of Anti-VEGF medications injected painlessly into the eye, as well as other possible treatment modalities, including thermal laser therapy. Fellow eye involvement and risks were discussed with the patient. Upon the finding of wet age related macular degeneration, treatment will be offered. The treatment regimen is on a treat as needed basis with the intent to treat if necessary and extend interval of exams when possible. On average 1 out of 6 patients do not need lifetime therapy. However, the risk of recurrent disease is high for a lifetime.  Initially monthly, then periodic, examinations and evaluations will determine whether the next treatment is required on the day of the examination.  OS, subretinal fluid is chronic since this condition is associated with RPE rip and a subfoveal disciform scar.  Able to 10-week interval, will treat today and extend interval examination to 3 months      ICD-10-CM   1. Exudative age-related  macular degeneration of left eye with active choroidal neovascularization (HCC)  H35.3221 OCT, Retina - OU - Both Eyes    Intravitreal Injection, Pharmacologic Agent - OS - Left Eye    Bevacizumab (AVASTIN) SOLN 1.25 mg    1.  Repeat injection intravitreal Avastin OS today and examination in the months OU with possible injection OS  2.  3.  Ophthalmic Meds Ordered this visit:  Meds ordered this encounter  Medications  . Bevacizumab (AVASTIN) SOLN 1.25 mg       Return in about 3 months (around 07/13/2020) for DILATE OU, AVASTIN OCT, OS.  There are no Patient Instructions on file for this visit.   Explained the diagnoses, plan, and follow up with the patient and they expressed understanding.  Patient expressed understanding of the importance of proper follow up care.   Clent Demark Tevion Laforge M.D. Diseases & Surgery of the Retina and Vitreous [facility] [place] 04/12/20     Abbreviations: M myopia (nearsighted); A astigmatism; H hyperopia (farsighted); P presbyopia; Mrx spectacle prescription;  CTL contact lenses; OD right eye; OS left eye; OU both eyes  XT exotropia; ET esotropia; PEK punctate epithelial keratitis; PEE punctate epithelial erosions; DES dry eye syndrome; MGD meibomian gland dysfunction; ATs artificial tears; PFAT's preservative free artificial tears; [place] nuclear sclerotic cataract; PSC posterior subcapsular cataract; ERM epi-retinal membrane; PVD posterior vitreous detachment; RD retinal detachment; DM diabetes mellitus; DR diabetic retinopathy; NPDR non-proliferative diabetic retinopathy; PDR proliferative diabetic retinopathy; CSME clinically significant macular edema; DME diabetic macular edema; dbh dot blot hemorrhages; CWS cotton wool spot; POAG primary open angle glaucoma; C/D cup-to-disc ratio; HVF humphrey visual field; GVF goldmann visual field; OCT optical coherence tomography; IOP intraocular pressure; BRVO Branch retinal vein occlusion; CRVO central  retinal vein occlusion; CRAO central retinal artery occlusion; BRAO branch retinal artery occlusion; RT retinal tear; SB scleral buckle; PPV pars plana vitrectomy; VH Vitreous hemorrhage; PRP panretinal laser photocoagulation; IVK intravitreal kenalog; VMT vitreomacular traction; MH Macular hole;  NVD neovascularization of the disc; NVE neovascularization elsewhere; AREDS age related eye disease study; ARMD age related macular degeneration; POAG primary open angle glaucoma; EBMD epithelial/anterior basement membrane dystrophy; ACIOL anterior chamber intraocular lens; IOL intraocular lens; PCIOL posterior chamber intraocular lens; Phaco/IOL phacoemulsification with intraocular lens placement; [place] photorefractive keratectomy; LASIK laser assisted in situ keratomileusis; HTN hypertension; DM diabetes mellitus; COPD chronic obstructive pulmonary disease

## 2020-04-12 NOTE — Assessment & Plan Note (Signed)
The nature of wet macular degeneration was discussed with the patient.  Forms of therapy reviewed include the use of Anti-VEGF medications injected painlessly into the eye, as well as other possible treatment modalities, including thermal laser therapy. Fellow eye involvement and risks were discussed with the patient. Upon the finding of wet age related macular degeneration, treatment will be offered. The treatment regimen is on a treat as needed basis with the intent to treat if necessary and extend interval of exams when possible. On average 1 out of 6 patients do not need lifetime therapy. However, the risk of recurrent disease is high for a lifetime.  Initially monthly, then periodic, examinations and evaluations will determine whether the next treatment is required on the day of the examination.  OS, subretinal fluid is chronic since this condition is associated with RPE rip and a subfoveal disciform scar.  Able to 10-week interval, will treat today and extend interval examination to 3 months

## 2020-04-13 ENCOUNTER — Other Ambulatory Visit: Payer: Self-pay | Admitting: Family Medicine

## 2020-04-13 DIAGNOSIS — E119 Type 2 diabetes mellitus without complications: Secondary | ICD-10-CM

## 2020-04-15 ENCOUNTER — Other Ambulatory Visit: Payer: Self-pay | Admitting: Family Medicine

## 2020-04-25 ENCOUNTER — Other Ambulatory Visit: Payer: Self-pay | Admitting: Family Medicine

## 2020-04-25 DIAGNOSIS — Z1231 Encounter for screening mammogram for malignant neoplasm of breast: Secondary | ICD-10-CM

## 2020-04-28 ENCOUNTER — Ambulatory Visit
Admission: RE | Admit: 2020-04-28 | Discharge: 2020-04-28 | Disposition: A | Discharge status: Home / Self Care | Payer: Medicare Other | Source: Ambulatory Visit | Attending: Family Medicine | Admitting: Family Medicine

## 2020-04-28 ENCOUNTER — Other Ambulatory Visit: Payer: Self-pay

## 2020-04-28 DIAGNOSIS — Z1231 Encounter for screening mammogram for malignant neoplasm of breast: Secondary | ICD-10-CM | POA: Diagnosis not present

## 2020-05-02 ENCOUNTER — Other Ambulatory Visit: Payer: Self-pay | Admitting: *Deleted

## 2020-05-02 MED ORDER — AMLODIPINE BESYLATE 5 MG PO TABS: 5.0000 mg | ORAL_TABLET | Freq: Every day | ORAL | 0 refills | Status: DC

## 2020-05-18 ENCOUNTER — Other Ambulatory Visit: Payer: Self-pay | Admitting: Family Medicine

## 2020-05-18 DIAGNOSIS — E038 Other specified hypothyroidism: Secondary | ICD-10-CM

## 2020-05-24 DIAGNOSIS — R509 Fever, unspecified: Secondary | ICD-10-CM | POA: Diagnosis not present

## 2020-05-24 DIAGNOSIS — U071 COVID-19: Secondary | ICD-10-CM | POA: Diagnosis not present

## 2020-05-24 DIAGNOSIS — R05 Cough: Secondary | ICD-10-CM | POA: Diagnosis not present

## 2020-05-29 ENCOUNTER — Ambulatory Visit: Payer: Medicare Other

## 2020-06-15 ENCOUNTER — Ambulatory Visit (INDEPENDENT_AMBULATORY_CARE_PROVIDER_SITE_OTHER): Payer: Medicare Other | Admitting: Family Medicine

## 2020-06-15 ENCOUNTER — Other Ambulatory Visit: Payer: Self-pay

## 2020-06-15 ENCOUNTER — Encounter: Payer: Self-pay | Admitting: Family Medicine

## 2020-06-15 VITALS — BP 124/68 | HR 79 | Ht 67.0 in | Wt 251.8 lb

## 2020-06-15 DIAGNOSIS — I10 Essential (primary) hypertension: Secondary | ICD-10-CM

## 2020-06-15 DIAGNOSIS — Z23 Encounter for immunization: Secondary | ICD-10-CM | POA: Diagnosis not present

## 2020-06-15 DIAGNOSIS — E119 Type 2 diabetes mellitus without complications: Secondary | ICD-10-CM | POA: Diagnosis not present

## 2020-06-15 DIAGNOSIS — E785 Hyperlipidemia, unspecified: Secondary | ICD-10-CM | POA: Diagnosis not present

## 2020-06-15 LAB — POCT GLYCOSYLATED HEMOGLOBIN (HGB A1C): HbA1c, POC (controlled diabetic range): 7.3 % — AB (ref 0.0–7.0)

## 2020-06-15 NOTE — Assessment & Plan Note (Signed)
Doing well.  Want CGM to sharpen lifestyle focus.

## 2020-06-15 NOTE — Assessment & Plan Note (Signed)
Talked again about diet and exercise.  I believe continuous glucose monitor may help with lifestyle changes.

## 2020-06-15 NOTE — Assessment & Plan Note (Signed)
Trial off amlodipine.  Follow BP carefully.

## 2020-06-15 NOTE — Assessment & Plan Note (Addendum)
States she was given 5 mg pills.  Will recheck Direct LDL

## 2020-06-15 NOTE — Progress Notes (Signed)
    SUBJECTIVE:   CHIEF COMPLAINT / HPI:   FU diabetes and recent COVID infection. DM control continues to improve.  7.3 today.  Given age, my goal is less than 8.  Her goal is less than seven.  She wants to try continuous glucose monitoring to better understand effects of diet and exercise. HBP.  She would like to try less meds since her BP is always good. ASCVD risk.  Has been taking ASA for primary prevention.  Instructed to stop based on data of risk benefit ratio. Recent COVID.  Had received two doses of Pfizer vaccine.  She had mild symptoms.  Friend infected at similar time and unvaccinated is still in the hospital after one month.  Asks about when booster.   Will get flu shot today.      OBJECTIVE:   BP 124/68   Pulse 79   Ht 5\' 7"  (1.702 m)   Wt 251 lb 12.8 oz (114.2 kg)   SpO2 96%   BMI 39.44 kg/m   Lungs clear Cardiac RRR without m or g Ext no edema.  ASSESSMENT/PLAN:   Hyperlipidemia States she was given 5 mg pills.  Will recheck Direct LDL  Primary hypertension Trial off amlodipine.  Follow BP carefully.  Morbid obesity ([place]) Talked again about diet and exercise.  I believe continuous glucose monitor may help with lifestyle changes.  Diabetes mellitus without complication ([place]) Doing well.  Want CGM to sharpen lifestyle focus.     [name], MD [place]

## 2020-06-15 NOTE — Patient Instructions (Addendum)
Please make an appointment with Dr. Valentina Lucks for the continuous glucose monitoring device.  Bring in all your medications for that visit. Your A1C is pretty good.  7.3 is fine.  It could be better.   Stop the daily aspirin Stop the amlodipine and watch your blood pressure.   I would wait a few months on the COVID booster.  Who knows, we may have more information by then.   I will call with your cholesterol result. See me in 3 months.

## 2020-06-16 LAB — LDL CHOLESTEROL, DIRECT: LDL Direct: 54 mg/dL (ref 0–99)

## 2020-06-22 ENCOUNTER — Other Ambulatory Visit: Payer: Self-pay

## 2020-06-22 ENCOUNTER — Encounter: Payer: Self-pay | Admitting: Pharmacist

## 2020-06-22 ENCOUNTER — Ambulatory Visit (INDEPENDENT_AMBULATORY_CARE_PROVIDER_SITE_OTHER): Payer: Medicare Other | Admitting: Pharmacist

## 2020-06-22 VITALS — BP 132/78 | HR 76 | Ht 68.5 in | Wt 254.0 lb

## 2020-06-22 DIAGNOSIS — E119 Type 2 diabetes mellitus without complications: Secondary | ICD-10-CM

## 2020-06-22 MED ORDER — METFORMIN HCL ER 500 MG PO TB24: 500.0000 mg | ORAL_TABLET | Freq: Two times a day (BID) | ORAL | 3 refills | Status: DC

## 2020-06-22 MED ORDER — OZEMPIC (0.25 OR 0.5 MG/DOSE) 2 MG/1.5ML ~~LOC~~ SOPN: 0.5000 mg | PEN_INJECTOR | SUBCUTANEOUS | 0 refills | Status: DC

## 2020-06-22 NOTE — Patient Instructions (Addendum)
Nice to see you today!  Stop Metformin (immediate release) and begin Metformin Extended Release (XR) twice daily with meals. If you tolerate this for a week, increase to once daily in the morning and two in the evening.   Finish your supply of Victoza. You can start Ozempic the day after your last Victoza dose. - Inject Ozempic 0.25mg  once weekly for two weeks - After two weeks, inject Ozempic 0.5mg  once weekly  Call us in December to schedule an appointment with Dr. Valentina Lucks in January on a Thursday if available!

## 2020-06-22 NOTE — Assessment & Plan Note (Signed)
Diabetes longstanding currently controlled with A1C 7.3 (provider A1C goal <8, patient goal <7). Patient denies recent hypoglycemic events and is able to verbalize appropriate hypoglycemia management plan. She is adherent to her medications. Her fasting blood sugars are approaching goal 80-130, post prandial are sometimes elevated >180. Blood sugar control is variable due to sedentary lifestyle and possible insulin resistance.   Unfortunately she would not qualify for CGM given her lack of multi-shot insulin regimen (insurance will not cover) and was made aware of cost of CGM. She decided not to proceed with CGM monitoring at this time but will continue checking blood sugar at home.  She would benefit from a trial of extended release metformin in order to titrate to maximum dose if possible.   -Continue basal insulin Tresiba 60 units once daily -Continue supply of Victoza 1.8mg  once daily until finished  -Start Ozempic (semaglutide) to begin when supply of Victoza is complete. Inject Ozempic 0.25mg  once weekly for two weeks, then titrate to Ozempic 0.5mg  once weekly  -Discontinue metformin immediate release and begin metformin 500mg  extended release (XR) twice daily with meals. If tolerating for one week, increase to metformin XR 500mg  every morning and 1,000mg  every evening with meals.

## 2020-06-22 NOTE — Progress Notes (Signed)
S:     Chief Complaint  Patient presents with  . Medication Management    Diabetes - CGM    Patient arrives in good spirits for diabetes evaluation, education, and management. Patient is here primarily for discussion of continuous glucose monitor system. She recently had COVID and was cleared from quarantine on 06/04/20. She said she had little appetite during her infection and reduced her dose of Tresiba from 70 units to 60 units once daily after she began having some episodes of hypoglycemia (BG 70-80). She continues on Victoza and metformin as prescribed and reports compliance with medication adherence. She has been on a higher dose of metformin in the past but had to reduce the dose to her current regimen due to GI adverse effects.  Current diabetes medications include: metformin 500mg  with meals twice daily, Victoza 1.8mg  injection once daily, Tresiba 60 units once daily.  Patient-reported exercise habits: she reports little to no exercise after COVID infection. She enjoys going out to eat or shopping but has only started thinking about re-joining a gym. Her confidence to start exercising again on a scale of 1-10 is a 5.    O:  Physical Exam   ROS   Lab Results  Component Value Date   HGBA1C 7.3 (A) 06/15/2020   Vitals:   06/22/20 1532  BP: 132/78  Pulse: 76  SpO2: 97%    Lipid Panel     Component Value Date/Time   CHOL 101 08/04/2019 1704   TRIG 186 (H) 08/04/2019 1704   HDL 43 08/04/2019 1704   CHOLHDL 2.3 08/04/2019 1704   CHOLHDL 2.4 02/05/2016 1606   VLDL 25 02/05/2016 1606   LDLCALC 28 08/04/2019 1704   LDLDIRECT 54 06/15/2020 1017   LDLDIRECT 56 08/04/2013 0934    Home fasting blood sugars: around 135 2 hour post-meal/random blood sugars: 150-200s   A/P: Diabetes longstanding currently controlled with A1C 7.3 (provider A1C goal <8, patient goal <7). Patient denies recent hypoglycemic events and is able to verbalize appropriate hypoglycemia  management plan. She is adherent to her medications. Her fasting blood sugars are approaching goal 80-130, post prandial are sometimes elevated >180. Blood sugar control is variable due to sedentary lifestyle and possible insulin resistance.   Unfortunately she would not qualify for CGM given her lack of multi-shot insulin regimen (insurance will not cover) and was made aware of cost of CGM. She decided not to proceed with CGM monitoring at this time but will continue checking blood sugar at home.  She would benefit from a trial of extended release metformin in order to titrate to maximum dose if possible.   -Continue basal insulin Tresiba 60 units once daily -Continue supply of Victoza 1.8mg  once daily until finished  -Start Ozempic (semaglutide) to begin when supply of Victoza is complete. Inject Ozempic 0.25mg  once weekly for two weeks, then titrate to Ozempic 0.5mg  once weekly  -Discontinue metformin immediate release and begin metformin 500mg  extended release (XR) twice daily with meals. If tolerating for one week, increase to metformin XR 500mg  every morning and 1,000mg  every evening with meals.   -Extensively discussed pathophysiology of diabetes, recommended lifestyle interventions, dietary effects on blood sugar control -Counseled on s/sx of and management of hypoglycemia -Instructed her to continue checking blood sugar in the mornings and after meals -Encouraged joining a gym  -Next A1C anticipated 08/2019.   Medication Samples have been provided to the patient.  Drug name: Ozempic       Strength: 0.25  Qty: 2  LOT: [id number]  Exp.Date: 08/25/2022  Dosing instructions: Inject Ozempic 0.25mg  once weekly for two weeks, then titrate to Ozempic 0.5mg  once weekly   The patient has been instructed regarding the correct time, dose, and frequency of taking this medication, including desired effects and most common side effects.   Written patient instructions provided.  Total time in face  to face counseling 45 minutes.   Follow up Pharmacist Clinic Visit in January. Patient to call in December to schedule.   Patient seen with Mercy Riding, PharmD, PGY1 Acute Care Resident and Lorel Monaco, PharmD, PGY2 Ambulatory Care Resident

## 2020-06-22 NOTE — Progress Notes (Signed)
Reviewed: I agree with Dr. Koval's documentation and management. 

## 2020-07-13 ENCOUNTER — Ambulatory Visit (INDEPENDENT_AMBULATORY_CARE_PROVIDER_SITE_OTHER): Payer: Medicare Other | Admitting: Ophthalmology

## 2020-07-13 ENCOUNTER — Encounter (INDEPENDENT_AMBULATORY_CARE_PROVIDER_SITE_OTHER): Payer: Self-pay | Admitting: Ophthalmology

## 2020-07-13 ENCOUNTER — Other Ambulatory Visit: Payer: Self-pay

## 2020-07-13 DIAGNOSIS — H35371 Puckering of macula, right eye: Secondary | ICD-10-CM

## 2020-07-13 DIAGNOSIS — E119 Type 2 diabetes mellitus without complications: Secondary | ICD-10-CM | POA: Diagnosis not present

## 2020-07-13 DIAGNOSIS — H353221 Exudative age-related macular degeneration, left eye, with active choroidal neovascularization: Secondary | ICD-10-CM

## 2020-07-13 LAB — HM DIABETES EYE EXAM

## 2020-07-13 MED ORDER — BEVACIZUMAB CHEMO INJECTION 1.25MG/0.05ML SYRINGE FOR KALEIDOSCOPE
1.2500 mg | INTRAVITREAL | Status: AC | PRN
  Administered 2020-07-13: 1.25 mg via INTRAVITREAL

## 2020-07-13 NOTE — Progress Notes (Signed)
07/13/2020     CHIEF COMPLAINT Patient presents for Retina Follow Up   HISTORY OF PRESENT ILLNESS: Kaitlyn Ferguson is a 74 y.o. female who presents to the clinic today for:   HPI    Retina Follow Up    Patient presents with  Wet AMD.  In left eye.  This started 3 months ago.  Severity is mild.  Duration of 3 months.  Since onset it is stable.          Comments    3 MO F/U OU, POSS AVASTIN OS   Pt reports stable vision, no new F/F, no pain or pressure.        Last edited by [name] on 07/13/2020 10:17 AM. (History)      Referring physician: [name], MD [place],  [place] [id number]  HISTORICAL INFORMATION:   Selected notes from the MEDICAL RECORD NUMBER    Lab Results  Component Value Date   HGBA1C 7.3 (A) 06/15/2020     CURRENT MEDICATIONS: No current outpatient medications on file. (Ophthalmic Drugs)   No current facility-administered medications for this visit. (Ophthalmic Drugs)   Current Outpatient Medications (Other)  Medication Sig  . Insulin Pen Needle (B-D UF III MINI PEN NEEDLES) 31G X 5 MM MISC USE TWICE DAILY  . Blood Glucose Monitoring Suppl (ONETOUCH VERIO FLEX SYSTEM) w/Device KIT 1 application by Does not apply route 2 (two) times daily.  . Blood Glucose Monitoring Suppl (ONETOUCH VERIO) w/Device KIT Check blood sugars 2 times daily. Code: E11.9  . cholecalciferol (VITAMIN D3) 25 MCG (1000 UNIT) tablet Take 1,000 Units by mouth daily.  . diclofenac Sodium (VOLTAREN) 1 % GEL Apply 2 g topically 4 (four) times daily. (Patient not taking: Reported on 06/22/2020)  . glucose blood (ONETOUCH VERIO) test strip Use to check blood sugar twice daily or as directed. Code: E11.9  . insulin degludec (TRESIBA FLEXTOUCH) 100 UNIT/ML FlexTouch Pen ADMINISTER 70 UNITS UNDER THE SKIN EVERY DAY AS DIRECTED (Patient taking differently: Inject 60 Units into the skin daily. ADMINISTER 70 UNITS UNDER THE SKIN EVERY DAY  AS DIRECTED)  . Lancet Devices (ONE TOUCH DELICA LANCING DEV) MISC Check blood sugars 2 times daily. Code: E11.9  . Lancets (FREESTYLE) lancets 1 each by Other route 2 (two) times daily. Use as instructed  . Lancets (ONETOUCH DELICA PLUS [id number]) MISC 1 each by Does not apply route 2 (two) times daily.  Marland Kitchen levothyroxine (SYNTHROID) 88 MCG tablet TAKE 1 TABLET BY MOUTH EVERY DAY BEFORE BREAKFAST  . lisinopril-hydrochlorothiazide (ZESTORETIC) 20-12.5 MG tablet Take 1 tablet by mouth daily.  . metFORMIN (GLUCOPHAGE-XR) 500 MG 24 hr tablet Take 1 tablet (500 mg total) by mouth 2 (two) times daily with a meal.  . Multiple Vitamin (MULTIVITAMIN WITH MINERALS) TABS tablet Take 1 tablet by mouth daily. With Vitamin D3  . omeprazole (PRILOSEC) 40 MG capsule TAKE 1 CAPSULE(40 MG) BY MOUTH DAILY  . ondansetron (ZOFRAN) 4 MG tablet Take 1 tablet (4 mg total) by mouth every 8 (eight) hours as needed for nausea or vomiting. (Patient not taking: Reported on 06/22/2020)  . rosuvastatin (CRESTOR) 5 MG tablet Take 5 mg by mouth daily.  . Semaglutide,0.25 or 0.5MG/DOS, (OZEMPIC, 0.25 OR 0.5 MG/DOSE,) 2 MG/1.5ML SOPN Inject 0.5 mg into the skin once a week.   No current facility-administered medications for this visit. (Other)      REVIEW OF SYSTEMS:    ALLERGIES Allergies  Allergen Reactions  .  Gabapentin Nausea Only  . Vancomycin Itching    Pre-medicated with benadryl and tylenol 1 hr before administration of vanc    PAST MEDICAL HISTORY Past Medical History:  Diagnosis Date  . Anatomical narrow angle glaucoma with borderline intraocular pressure, right 02/01/2020  . Arthritis   . Diabetes mellitus   . DJD (degenerative joint disease) 10/07/2008  . DJD (degenerative joint disease)   . Family history of adverse reaction to anesthesia    MOTHER WAS DIFFICULT TO WAKE UP  . Hyperlipidemia   . Hypertension   . Hypothyroidism   . Nausea    INTERMITANT - TAKES PRILOSEC (DENIES REFLUX)  . Obesity     . Osteomyelitis of ankle (HCC)    Complications of Right ankle injury   Past Surgical History:  Procedure Laterality Date  . ABDOMINAL HYSTERECTOMY    . ANKLE FUSION  02/1996   Right ankle tib/talar  . BACK SURGERY  1984  . BREAST EXCISIONAL BIOPSY Right   . CARPAL TUNNEL RELEASE  1993   Right arm  . CHOLECYSTECTOMY    . EYE SURGERY Left   . NECK MASS EXCISION  12/29/2001  . right eye laser    . TONSILLECTOMY     Childhood  . TOTAL KNEE ARTHROPLASTY Left 12/21/2015   Procedure: LEFT TOTAL KNEE ARTHROPLASTY WITH COMPLEX COMPUTER NAVIGATION ;  Surgeon: Rod Can, MD;  Location: [facility];  Service: Orthopedics;  Laterality: Left;  . TOTAL SHOULDER ARTHROPLASTY Right 07/11/2016   Procedure: RIGHT TOTAL SHOULDER ARTHROPLASTY;  Surgeon: Justice Britain, MD;  Location: [facility];  Service: Orthopedics;  Laterality: Right;  . TOTAL SHOULDER ARTHROPLASTY Left 11/04/2019   Procedure: TOTAL SHOULDER ARTHROPLASTY;  Surgeon: Justice Britain, MD;  Location: [facility];  Service: Orthopedics;  Laterality: Left;  164mn    FAMILY HISTORY Family History  Problem Relation Age of Onset  . Diabetes Brother   . Diabetes Sister        DM2-lost weight-now okay  . Heart attack Father        First MI- early 584's . Stroke Mother   . Heart failure Mother   . Multiple myeloma Mother        Deceased at 757due to same  . Breast cancer Maternal Aunt   . Heart attack Son     SOCIAL HISTORY Social History   Tobacco Use  . Smoking status: Never Smoker  . Smokeless tobacco: Never Used  . Tobacco comment: No tobacco currently -   Vaping Use  . Vaping Use: Never used  Substance Use Topics  . Alcohol use: No    Alcohol/week: 0.0 standard drinks  . Drug use: No         OPHTHALMIC EXAM: Base Eye Exam    Visual Acuity (Snellen - Linear)      Right Left   Dist [place] 20/40 +2 20/100 -1   Dist ph [place] 20/25 20/50 +1       Tonometry (Tonopen, 10:26 AM)      Right Left   Pressure 15 12       Pupils       Pupils Dark Light Shape React APD   Right PERRL 4 3 Round Brisk None   Left PERRL 4 3 Round Brisk None       Visual Fields (Counting fingers)      Left Right    Full Full       Extraocular Movement      Right Left  Full Full       Neuro/Psych    Oriented x3: Yes   Mood/Affect: Normal       Dilation    Both eyes: 1.0% Mydriacyl, 2.5% Phenylephrine @ 10:26 AM        Slit Lamp and Fundus Exam    External Exam      Right Left   External Normal Normal       Slit Lamp Exam      Right Left   Lids/Lashes Normal Normal   Conjunctiva/Sclera White and quiet White and quiet   Cornea Clear Clear   Anterior Chamber Deep and quiet Deep and quiet   Iris Round and reactive Round and reactive   Lens Posterior chamber intraocular lens, Centered posterior chamber intraocular lens Posterior chamber intraocular lens, Centered posterior chamber intraocular lens   Anterior Vitreous Normal Normal       Fundus Exam      Right Left   Posterior Vitreous Posterior vitreous detachment Posterior vitreous detachment   Disc Normal Normal   C/D Ratio 0.35 0.35   Macula Epiretinal membrane Intraretinal hemorrhage, Retinal pigment epithelial mottling, Disciform scar   Vessels no DR no DR   Periphery Normal Normal          IMAGING AND PROCEDURES  Imaging and Procedures for 07/13/20  OCT, Retina - OU - Both Eyes       Right Eye Quality was good. Scan locations included subfoveal. Central Foveal Thickness: 306. Progression has been stable. Findings include epiretinal membrane.   Left Eye Quality was good. Scan locations included subfoveal. Central Foveal Thickness: 322. Progression has worsened. Findings include subretinal fluid.   Notes OS increased fluid       Intravitreal Injection, Pharmacologic Agent - OS - Left Eye       Time Out 07/13/2020. 11:33 AM. Confirmed correct patient, procedure, site, and patient consented.   Anesthesia Topical anesthesia was used.  Anesthetic medications included [place] 3.5%.   Procedure Preparation included Tobramycin 0.3%, 10% betadine to eyelids, 5% betadine to ocular surface. A supplied needle was used.   Injection:  1.25 mg Bevacizumab (AVASTIN) SOLN   NDC: 70360-001-02, Lot: [id number]   Route: Intravitreal, Site: Left Eye, Waste: 0 mg  Post-op Post injection exam found visual acuity of at least counting fingers. The patient tolerated the procedure well. There were no complications. The patient received written and verbal post procedure care education. Post injection medications were not given.                 ASSESSMENT/PLAN:  Exudative age-related macular degeneration of left eye with active choroidal neovascularization (HCC) Old RPE tear with chronic serous subfoveal detachment now worse fluid collection at 33-monthfollow-up, will need to repeat injection I Avastin today and examination in 8 weeks  Right epiretinal membrane Severe topographic distortion right eye with no interval change over the preceding years And with preserved acuity will observe  Diabetes mellitus without complication (HCC) No accumulative diabetic eye disease      ICD-10-CM   1. Exudative age-related macular degeneration of left eye with active choroidal neovascularization (HCC)  H35.3221 OCT, Retina - OU - Both Eyes    Intravitreal Injection, Pharmacologic Agent - OS - Left Eye    Bevacizumab (AVASTIN) SOLN 1.25 mg  2. Right epiretinal membrane  H35.371   3. Diabetes mellitus without complication ([place]  EO70.9    1.  Slightly more active subretinal fluid from RPE tear, CNVM, vascularized PED left eye,  will repeat Avastin left eye today to protect and maintain visual acuity and examination repeat in 8 weeks  2.  Epiretinal membrane, no specific therapy warranted at this time stable over time  3.  Ophthalmic Meds Ordered this visit:  Meds ordered this encounter  Medications  . Bevacizumab (AVASTIN) SOLN 1.25 mg         Return in about 8 weeks (around 09/07/2020) for dilate, OS, AVASTIN OCT.  There are no Patient Instructions on file for this visit.   Explained the diagnoses, plan, and follow up with the patient and they expressed understanding.  Patient expressed understanding of the importance of proper follow up care.   Clent Demark Carmon Brigandi M.D. Diseases & Surgery of the Retina and Vitreous Retina & Diabetic [place] 07/13/20     Abbreviations: M myopia (nearsighted); A astigmatism; H hyperopia (farsighted); P presbyopia; Mrx spectacle prescription;  CTL contact lenses; OD right eye; OS left eye; OU both eyes  XT exotropia; ET esotropia; PEK punctate epithelial keratitis; PEE punctate epithelial erosions; DES dry eye syndrome; MGD meibomian gland dysfunction; ATs artificial tears; PFAT's preservative free artificial tears; [place] nuclear sclerotic cataract; PSC posterior subcapsular cataract; ERM epi-retinal membrane; PVD posterior vitreous detachment; RD retinal detachment; DM diabetes mellitus; DR diabetic retinopathy; NPDR non-proliferative diabetic retinopathy; PDR proliferative diabetic retinopathy; CSME clinically significant macular edema; DME diabetic macular edema; dbh dot blot hemorrhages; CWS cotton wool spot; POAG primary open angle glaucoma; C/D cup-to-disc ratio; HVF humphrey visual field; GVF goldmann visual field; OCT optical coherence tomography; IOP intraocular pressure; BRVO Branch retinal vein occlusion; CRVO central retinal vein occlusion; CRAO central retinal artery occlusion; BRAO branch retinal artery occlusion; RT retinal tear; SB scleral buckle; PPV pars plana vitrectomy; VH Vitreous hemorrhage; PRP panretinal laser photocoagulation; IVK intravitreal kenalog; VMT vitreomacular traction; MH Macular hole;  NVD neovascularization of the disc; NVE neovascularization elsewhere; AREDS age related eye disease study; ARMD age related macular degeneration; POAG primary open angle glaucoma; EBMD  epithelial/anterior basement membrane dystrophy; ACIOL anterior chamber intraocular lens; IOL intraocular lens; PCIOL posterior chamber intraocular lens; Phaco/IOL phacoemulsification with intraocular lens placement; [place] photorefractive keratectomy; LASIK laser assisted in situ keratomileusis; HTN hypertension; DM diabetes mellitus; COPD chronic obstructive pulmonary disease

## 2020-07-13 NOTE — Assessment & Plan Note (Signed)
Severe topographic distortion right eye with no interval change over the preceding years And with preserved acuity will observe

## 2020-07-13 NOTE — Assessment & Plan Note (Signed)
No accumulative diabetic eye disease

## 2020-07-13 NOTE — Assessment & Plan Note (Signed)
Old RPE tear with chronic serous subfoveal detachment now worse fluid collection at 65-month follow-up, will need to repeat injection I Avastin today and examination in 8 weeks

## 2020-07-25 DIAGNOSIS — H40021 Open angle with borderline findings, high risk, right eye: Secondary | ICD-10-CM | POA: Diagnosis not present

## 2020-07-25 DIAGNOSIS — E119 Type 2 diabetes mellitus without complications: Secondary | ICD-10-CM | POA: Diagnosis not present

## 2020-07-25 DIAGNOSIS — Z794 Long term (current) use of insulin: Secondary | ICD-10-CM | POA: Diagnosis not present

## 2020-07-25 DIAGNOSIS — H353111 Nonexudative age-related macular degeneration, right eye, early dry stage: Secondary | ICD-10-CM | POA: Diagnosis not present

## 2020-07-25 DIAGNOSIS — H401122 Primary open-angle glaucoma, left eye, moderate stage: Secondary | ICD-10-CM | POA: Diagnosis not present

## 2020-07-26 DIAGNOSIS — Z86010 Personal history of colonic polyps: Secondary | ICD-10-CM | POA: Diagnosis not present

## 2020-07-26 DIAGNOSIS — E119 Type 2 diabetes mellitus without complications: Secondary | ICD-10-CM | POA: Diagnosis not present

## 2020-07-26 DIAGNOSIS — K219 Gastro-esophageal reflux disease without esophagitis: Secondary | ICD-10-CM | POA: Diagnosis not present

## 2020-07-31 ENCOUNTER — Other Ambulatory Visit: Payer: Self-pay | Admitting: Family Medicine

## 2020-08-28 NOTE — Progress Notes (Signed)
This examination was started by staff, but could not be completed due to office functioning circumstances.   The computer system malfunctiononed this day

## 2020-08-29 ENCOUNTER — Other Ambulatory Visit: Payer: Self-pay

## 2020-08-29 ENCOUNTER — Ambulatory Visit (INDEPENDENT_AMBULATORY_CARE_PROVIDER_SITE_OTHER): Payer: Medicare Other | Admitting: Pharmacist

## 2020-08-29 ENCOUNTER — Encounter: Payer: Self-pay | Admitting: Pharmacist

## 2020-08-29 DIAGNOSIS — E119 Type 2 diabetes mellitus without complications: Secondary | ICD-10-CM

## 2020-08-29 MED ORDER — TRESIBA FLEXTOUCH 100 UNIT/ML ~~LOC~~ SOPN: 62.0000 [IU] | PEN_INJECTOR | Freq: Every day | SUBCUTANEOUS | Status: DC

## 2020-08-29 MED ORDER — SEMAGLUTIDE (1 MG/DOSE) 2 MG/1.5ML ~~LOC~~ SOPN: 1.0000 mg | PEN_INJECTOR | SUBCUTANEOUS | 5 refills | Status: DC

## 2020-08-29 NOTE — Progress Notes (Signed)
    S:     Chief Complaint  Patient presents with  . Medication Management    Diabetes - GLP - Ozempic     Patient arrives in good spirits, ambulating without assistance.  Presents for diabetes evaluation, education, and management Patient was referred and last seen by Primary Care Provider on 06/15/2020 and Rx clinic 06/22/2020..    Medication adherence reported -  Self reported as good. Current diabetes medications include: Tresiba 62 units, metformin XR 500mg  BID and Ozempic 0.5mg  once weekly.  Current hypertension medications include: amlodipine 5mg  dailiy, lisinopril/HCTZ 20/12.5mg  daily.  Current hyperlipidemia medications include: rosuvastin 5mg   Patient denies hypoglycemic events.   Patient-reported [facility] habits: [facility] (45 minutes per visit).     O:  Physical Exam Vitals reviewed.  Constitutional:      Appearance: Normal appearance.  Pulmonary:     Effort: Pulmonary effort is normal.  Neurological:     Mental Status: She is alert.  Psychiatric:        Mood and Affect: Mood normal.        Behavior: Behavior normal.        Thought Content: Thought content normal.      Review of Systems  All other systems reviewed and are negative.    Lab Results  Component Value Date   HGBA1C 7.3 (A) 06/15/2020   Vitals:   08/29/20 1002  BP: (!) 144/78  Pulse: 71  SpO2: 97%    Lipid Panel     Component Value Date/Time   CHOL 101 08/04/2019 1704   TRIG 186 (H) 08/04/2019 1704   HDL 43 08/04/2019 1704   CHOLHDL 2.3 08/04/2019 1704   CHOLHDL 2.4 02/05/2016 1606   VLDL 25 02/05/2016 1606   LDLCALC 28 08/04/2019 1704   LDLDIRECT 54 06/15/2020 1017   LDLDIRECT 56 08/04/2013 0934    Home fasting blood sugars: low 100s     Clinical Atherosclerotic Cardiovascular Disease (ASCVD): No    A/P: Diabetes longstanding and currently well controlled. Patient is able to verbalize appropriate hypoglycemia management plan. Medication  adherence appears good. Tolerating switch from Victoza (liraglutide) to Ozempic (semaglutide). -Continued basal insulin Tresiba  (insulin degludec).  -Increased dose of GLP-1 Ozempic (semaglutide) from 0.5mg  to 1mg  once weekly after 1 additional dose to use the rest of current supply.   -Extensively discussed pathophysiology of diabetes, recommended lifestyle interventions, dietary effects on blood sugar control -Counseled on s/sx of and management of hypoglycemia -Next A1C anticipated at next PCP visit with Dr. 14/04/2019 in February.    Written patient instructions provided.  Total time in face to face counseling 30 minutes.   Follow up PCP Clinic Visit in  1 month. Rx Clinic visit - PRN  Patient seen with 14/05/2013, PharmD Candidate, and , PharmD - PGY-1 Resident.

## 2020-08-29 NOTE — Assessment & Plan Note (Signed)
Diabetes longstanding and currently well controlled. Patient is able to verbalize appropriate hypoglycemia management plan. Medication adherence appears good. Tolerating switch from Victoza (liraglutide) to Ozempic (semaglutide). -Continued basal insulin Tresiba  (insulin degludec).  -Increased dose of GLP-1 Ozempic (semaglutide) from 0.5mg  to 1mg  once weekly after 1 additional dose to use the rest of current supply.   -Extensively discussed pathophysiology of diabetes, recommended lifestyle interventions, dietary effects on blood sugar control -Counseled on s/sx of and management of hypoglycemia -Next A1C anticipated at next PCP visit with Dr. in February.

## 2020-08-29 NOTE — Patient Instructions (Addendum)
Nice to see you today!  Keep up the great work being active.  Your weight progress is also great.  Keep going.   Please take your next/last dose of 0.5mg  next Monday.   The following week, please start the Ozempic at 1mg  once weekly.   Follow-up with Dr. in February - Make Appointment today.

## 2020-08-29 NOTE — Progress Notes (Signed)
Reviewed: Agree with Dr. Koval's documentation and management. 

## 2020-09-07 ENCOUNTER — Encounter (INDEPENDENT_AMBULATORY_CARE_PROVIDER_SITE_OTHER): Payer: Self-pay | Admitting: Ophthalmology

## 2020-09-07 ENCOUNTER — Ambulatory Visit (INDEPENDENT_AMBULATORY_CARE_PROVIDER_SITE_OTHER): Payer: Medicare Other | Admitting: Ophthalmology

## 2020-09-07 ENCOUNTER — Other Ambulatory Visit: Payer: Self-pay

## 2020-09-07 DIAGNOSIS — E119 Type 2 diabetes mellitus without complications: Secondary | ICD-10-CM | POA: Diagnosis not present

## 2020-09-07 DIAGNOSIS — H35371 Puckering of macula, right eye: Secondary | ICD-10-CM

## 2020-09-07 DIAGNOSIS — H353221 Exudative age-related macular degeneration, left eye, with active choroidal neovascularization: Secondary | ICD-10-CM

## 2020-09-07 MED ORDER — BEVACIZUMAB 2.5 MG/0.1ML IZ SOSY
2.5000 mg | PREFILLED_SYRINGE | INTRAVITREAL | Status: AC | PRN
  Administered 2020-09-07: 2.5 mg via INTRAVITREAL

## 2020-09-07 NOTE — Assessment & Plan Note (Signed)
No detectable diabetic retinopathy 

## 2020-09-07 NOTE — Assessment & Plan Note (Signed)
OS, chronic active subfoveal lesion now with white fibrotic scarring clinically as well as much less subretinal fluid around the subfoveal nodule.  Stabilizing currently at 8-week follow-up examination.  Repeat injection and Avastin today and examination again in 10 weeks

## 2020-09-07 NOTE — Assessment & Plan Note (Signed)
No interval change, epiretinal membrane, no impact on acuity will observe

## 2020-09-07 NOTE — Progress Notes (Signed)
09/07/2020     CHIEF COMPLAINT Patient presents for Retina Follow Up (8 Week Wet AMD f\u OS. Possible Avastin OS. OCT/Pt states OS vision has been blurry recently/BGL: 167)   HISTORY OF PRESENT ILLNESS: Kaitlyn Ferguson is a 75 y.o. female who presents to the clinic today for:   HPI    Retina Follow Up    Patient presents with  Wet AMD.  In left eye.  Severity is moderate.  Duration of 8 weeks.  Since onset it is stable.  I, the attending physician,  performed the HPI with the patient and updated documentation appropriately. Additional comments: 8 Week Wet AMD f\u OS. Possible Avastin OS. OCT Pt states OS vision has been blurry recently BGL: 167       Last edited by Tilda Franco on 09/07/2020 10:30 AM. (History)      Referring physician: [name], MD [place],  [place] [id number]  HISTORICAL INFORMATION:   Selected notes from the MEDICAL RECORD NUMBER    Lab Results  Component Value Date   HGBA1C 7.3 (A) 06/15/2020     CURRENT MEDICATIONS: No current outpatient medications on file. (Ophthalmic Drugs)   No current facility-administered medications for this visit. (Ophthalmic Drugs)   Current Outpatient Medications (Other)  Medication Sig  . Insulin Pen Needle (B-D UF III MINI PEN NEEDLES) 31G X 5 MM MISC USE TWICE DAILY  . amLODipine (NORVASC) 5 MG tablet Take 5 mg by mouth daily.  . Blood Glucose Monitoring Suppl (ONETOUCH VERIO FLEX SYSTEM) w/Device KIT 1 application by Does not apply route 2 (two) times daily.  . Blood Glucose Monitoring Suppl (ONETOUCH VERIO) w/Device KIT Check blood sugars 2 times daily. Code: E11.9  . cholecalciferol (VITAMIN D3) 25 MCG (1000 UNIT) tablet Take 1,000 Units by mouth daily. (Patient not taking: Reported on 08/29/2020)  . diclofenac Sodium (VOLTAREN) 1 % GEL Apply 2 g topically 4 (four) times daily. (Patient not taking: No sig reported)  . glucose blood (ONETOUCH VERIO) test strip Use to check  blood sugar twice daily or as directed. Code: E11.9  . insulin degludec (TRESIBA FLEXTOUCH) 100 UNIT/ML FlexTouch Pen Inject 62 Units into the skin daily.  Elmore Guise Devices (ONE TOUCH DELICA LANCING DEV) MISC Check blood sugars 2 times daily. Code: E11.9  . Lancets (FREESTYLE) lancets 1 each by Other route 2 (two) times daily. Use as instructed  . Lancets (ONETOUCH DELICA PLUS [id number]) MISC 1 each by Does not apply route 2 (two) times daily.  Marland Kitchen levothyroxine (SYNTHROID) 88 MCG tablet TAKE 1 TABLET BY MOUTH EVERY DAY BEFORE BREAKFAST  . lisinopril-hydrochlorothiazide (ZESTORETIC) 20-12.5 MG tablet Take 1 tablet by mouth daily.  . metFORMIN (GLUCOPHAGE-XR) 500 MG 24 hr tablet Take 1 tablet (500 mg total) by mouth 2 (two) times daily with a meal.  . Multiple Vitamin (MULTIVITAMIN WITH MINERALS) TABS tablet Take 1 tablet by mouth daily. With Vitamin D3  . omeprazole (PRILOSEC) 40 MG capsule TAKE 1 CAPSULE(40 MG) BY MOUTH DAILY  . rosuvastatin (CRESTOR) 5 MG tablet Take 5 mg by mouth daily.  . Semaglutide, 1 MG/DOSE, 2 MG/1.5ML SOPN Inject 1 mg into the skin once a week.   No current facility-administered medications for this visit. (Other)      REVIEW OF SYSTEMS: ROS    Positive for: Endocrine   Last edited by Tilda Franco on 09/07/2020 10:26 AM. (History)       ALLERGIES Allergies  Allergen Reactions  .  Gabapentin Nausea Only  . Vancomycin Itching    Pre-medicated with benadryl and tylenol 1 hr before administration of vanc    PAST MEDICAL HISTORY Past Medical History:  Diagnosis Date  . Anatomical narrow angle glaucoma with borderline intraocular pressure, right 02/01/2020  . Arthritis   . Diabetes mellitus   . DJD (degenerative joint disease) 10/07/2008  . DJD (degenerative joint disease)   . Family history of adverse reaction to anesthesia    MOTHER WAS DIFFICULT TO WAKE UP  . Hyperlipidemia   . Hypertension   . Hypothyroidism   . Nausea    INTERMITANT - TAKES  PRILOSEC (DENIES REFLUX)  . Obesity   . Osteomyelitis of ankle (HCC)    Complications of Right ankle injury   Past Surgical History:  Procedure Laterality Date  . ABDOMINAL HYSTERECTOMY    . ANKLE FUSION  02/1996   Right ankle tib/talar  . BACK SURGERY  1984  . BREAST EXCISIONAL BIOPSY Right   . CARPAL TUNNEL RELEASE  1993   Right arm  . CHOLECYSTECTOMY    . EYE SURGERY Left   . NECK MASS EXCISION  12/29/2001  . right eye laser    . TONSILLECTOMY     Childhood  . TOTAL KNEE ARTHROPLASTY Left 12/21/2015   Procedure: LEFT TOTAL KNEE ARTHROPLASTY WITH COMPLEX COMPUTER NAVIGATION ;  Surgeon: Rod Can, MD;  Location: [facility];  Service: Orthopedics;  Laterality: Left;  . TOTAL SHOULDER ARTHROPLASTY Right 07/11/2016   Procedure: RIGHT TOTAL SHOULDER ARTHROPLASTY;  Surgeon: Justice Britain, MD;  Location: [facility];  Service: Orthopedics;  Laterality: Right;  . TOTAL SHOULDER ARTHROPLASTY Left 11/04/2019   Procedure: TOTAL SHOULDER ARTHROPLASTY;  Surgeon: Justice Britain, MD;  Location: [facility];  Service: Orthopedics;  Laterality: Left;  160mn    FAMILY HISTORY Family History  Problem Relation Age of Onset  . Diabetes Brother   . Diabetes Sister        DM2-lost weight-now okay  . Heart attack Father        First MI- early 532's . Stroke Mother   . Heart failure Mother   . Multiple myeloma Mother        Deceased at 746due to same  . Breast cancer Maternal Aunt   . Heart attack Son     SOCIAL HISTORY Social History   Tobacco Use  . Smoking status: Never Smoker  . Smokeless tobacco: Never Used  . Tobacco comment: No tobacco currently -   Vaping Use  . Vaping Use: Never used  Substance Use Topics  . Alcohol use: No    Alcohol/week: 0.0 standard drinks  . Drug use: No         OPHTHALMIC EXAM: Base Eye Exam    Visual Acuity (Snellen - Linear)      Right Left   Dist [place] 20/60 -2 E Card @ 5'   Dist ph [place] 20/40 20/400       Tonometry (Tonopen, 10:32 AM)      Right Left    Pressure 13 9       Pupils      Pupils Dark Light Shape React APD   Right PERRL 4 3 Round Brisk None   Left PERRL 4 3 Round Brisk None       Visual Fields (Counting fingers)      Left Right    Full Full       Neuro/Psych    Oriented x3: Yes   Mood/Affect: Normal  Dilation    Left eye: 1.0% Mydriacyl, 2.5% Phenylephrine @ 10:32 AM        Slit Lamp and Fundus Exam    External Exam      Right Left   External Normal Normal       Slit Lamp Exam      Right Left   Lids/Lashes Normal Normal   Conjunctiva/Sclera White and quiet White and quiet   Cornea Clear Clear   Anterior Chamber Deep and quiet Deep and quiet   Iris Round and reactive Round and reactive   Lens Posterior chamber intraocular lens, Centered posterior chamber intraocular lens Posterior chamber intraocular lens, Centered posterior chamber intraocular lens   Anterior Vitreous Normal Normal       Fundus Exam      Right Left   Posterior Vitreous  Posterior vitreous detachment, Central vitreous floaters   Disc  Normal   C/D Ratio  0.35   Macula  Intraretinal hemorrhage, Retinal pigment epithelial mottling, Disciform scar   Vessels  no DR   Periphery  Normal          IMAGING AND PROCEDURES  Imaging and Procedures for 09/07/20  OCT, Retina - OU - Both Eyes       Right Eye Quality was good. Scan locations included subfoveal. Central Foveal Thickness: 288. Progression has been stable. Findings include epiretinal membrane, abnormal foveal contour.   Left Eye Quality was good. Scan locations included subfoveal. Central Foveal Thickness: 334. Progression has improved. Findings include cystoid macular edema, disciform scar, subretinal fluid, subretinal hyper-reflective material, abnormal foveal contour.   Notes Subretinal scarring, disciform scar, now with much less subretinal fluid, today at 8-week follow-up OS.  We will repeat injection today and may extend interval of the evaluation to 10  weeks  OD with irregular epiretinal membrane foveal distortion yet stable over time with no outer retinal changes.  We will continue to monitor and observe       Intravitreal Injection, Pharmacologic Agent - OS - Left Eye       Time Out 09/07/2020. 11:06 AM. Confirmed correct patient, procedure, site, and patient consented.   Anesthesia Topical anesthesia was used. Anesthetic medications included [place]ten 3.5%.   Procedure Preparation included Tobramycin 0.3%, 10% betadine to eyelids, 5% betadine to ocular surface. A supplied needle was used.   Injection:  2.5 mg Bevacizumab (AVASTIN) 2.68m/0.1mL SOSY   NDC: 701027-253-66 Lot:: [id number]  Route: Intravitreal, Site: Left Eye  Post-op Post injection exam found visual acuity of at least counting fingers. The patient tolerated the procedure well. There were no complications. The patient received written and verbal post procedure care education. Post injection medications were not given.                 ASSESSMENT/PLAN:  Diabetes mellitus without complication (HCC) No detectable diabetic retinopathy  Exudative age-related macular degeneration of left eye with active choroidal neovascularization (HCC) OS, chronic active subfoveal lesion now with white fibrotic scarring clinically as well as much less subretinal fluid around the subfoveal nodule.  Stabilizing currently at 8-week follow-up examination.  Repeat injection and Avastin today and examination again in 10 weeks  Right epiretinal membrane No interval change, epiretinal membrane, no impact on acuity will observe      ICD-10-CM   1. Exudative age-related macular degeneration of left eye with active choroidal neovascularization (HCC)  H35.3221 OCT, Retina - OU - Both Eyes    Intravitreal Injection, Pharmacologic Agent - OS - Left Eye  bevacizumab (AVASTIN) SOSY 2.5 mg  2. Diabetes mellitus without complication ([place])  L27.5   3. Right epiretinal membrane  H35.371      1.  2.  3.  Ophthalmic Meds Ordered this visit:  Meds ordered this encounter  Medications  . bevacizumab (AVASTIN) SOSY 2.5 mg       Return in about 10 weeks (around 11/16/2020) for dilate, OS, AVASTIN OCT.  There are no Patient Instructions on file for this visit.   Explained the diagnoses, plan, and follow up with the patient and they expressed understanding.  Patient expressed understanding of the importance of proper follow up care.   Clent Demark Milfred Krammes M.D. Diseases & Surgery of the Retina and Vitreous [facility] [place] 09/07/20     Abbreviations: M myopia (nearsighted); A astigmatism; H hyperopia (farsighted); P presbyopia; Mrx spectacle prescription;  CTL contact lenses; OD right eye; OS left eye; OU both eyes  XT exotropia; ET esotropia; PEK punctate epithelial keratitis; PEE punctate epithelial erosions; DES dry eye syndrome; MGD meibomian gland dysfunction; ATs artificial tears; PFAT's preservative free artificial tears; Coleman nuclear sclerotic cataract; PSC posterior subcapsular cataract; ERM epi-retinal membrane; PVD posterior vitreous detachment; RD retinal detachment; DM diabetes mellitus; DR diabetic retinopathy; NPDR non-proliferative diabetic retinopathy; PDR proliferative diabetic retinopathy; CSME clinically significant macular edema; DME diabetic macular edema; dbh dot blot hemorrhages; CWS cotton wool spot; POAG primary open angle glaucoma; C/D cup-to-disc ratio; HVF humphrey visual field; GVF goldmann visual field; OCT optical coherence tomography; IOP intraocular pressure; BRVO Branch retinal vein occlusion; CRVO central retinal vein occlusion; CRAO central retinal artery occlusion; BRAO branch retinal artery occlusion; RT retinal tear; SB scleral buckle; PPV pars plana vitrectomy; VH Vitreous hemorrhage; PRP panretinal laser photocoagulation; IVK intravitreal kenalog; VMT vitreomacular traction; MH Macular hole;  NVD neovascularization of the disc; NVE  neovascularization elsewhere; AREDS age related eye disease study; ARMD age related macular degeneration; POAG primary open angle glaucoma; EBMD epithelial/anterior basement membrane dystrophy; ACIOL anterior chamber intraocular lens; IOL intraocular lens; PCIOL posterior chamber intraocular lens; Phaco/IOL phacoemulsification with intraocular lens placement; [place] photorefractive keratectomy; LASIK laser assisted in situ keratomileusis; HTN hypertension; DM diabetes mellitus; COPD chronic obstructive pulmonary disease

## 2020-09-14 DIAGNOSIS — D179 Benign lipomatous neoplasm, unspecified: Secondary | ICD-10-CM | POA: Diagnosis not present

## 2020-09-14 DIAGNOSIS — D120 Benign neoplasm of cecum: Secondary | ICD-10-CM | POA: Diagnosis not present

## 2020-09-14 DIAGNOSIS — Z86010 Personal history of colonic polyps: Secondary | ICD-10-CM | POA: Diagnosis not present

## 2020-09-14 DIAGNOSIS — D123 Benign neoplasm of transverse colon: Secondary | ICD-10-CM | POA: Diagnosis not present

## 2020-09-14 DIAGNOSIS — K635 Polyp of colon: Secondary | ICD-10-CM | POA: Diagnosis not present

## 2020-09-15 ENCOUNTER — Encounter: Payer: Self-pay | Admitting: Family Medicine

## 2020-09-15 DIAGNOSIS — K635 Polyp of colon: Secondary | ICD-10-CM | POA: Insufficient documentation

## 2020-09-28 ENCOUNTER — Other Ambulatory Visit: Payer: Self-pay

## 2020-09-28 ENCOUNTER — Encounter: Payer: Self-pay | Admitting: Family Medicine

## 2020-09-28 ENCOUNTER — Ambulatory Visit (INDEPENDENT_AMBULATORY_CARE_PROVIDER_SITE_OTHER): Payer: Medicare Other | Admitting: Family Medicine

## 2020-09-28 VITALS — BP 128/68 | HR 87 | Ht 67.0 in | Wt 247.6 lb

## 2020-09-28 DIAGNOSIS — M159 Polyosteoarthritis, unspecified: Secondary | ICD-10-CM

## 2020-09-28 DIAGNOSIS — E11319 Type 2 diabetes mellitus with unspecified diabetic retinopathy without macular edema: Secondary | ICD-10-CM | POA: Diagnosis not present

## 2020-09-28 DIAGNOSIS — E119 Type 2 diabetes mellitus without complications: Secondary | ICD-10-CM | POA: Diagnosis not present

## 2020-09-28 DIAGNOSIS — E785 Hyperlipidemia, unspecified: Secondary | ICD-10-CM | POA: Diagnosis not present

## 2020-09-28 DIAGNOSIS — I10 Essential (primary) hypertension: Secondary | ICD-10-CM

## 2020-09-28 DIAGNOSIS — Z794 Long term (current) use of insulin: Secondary | ICD-10-CM

## 2020-09-28 LAB — POCT URINALYSIS DIP (MANUAL ENTRY)
Bilirubin, UA: NEGATIVE
Glucose, UA: NEGATIVE mg/dL
Ketones, POC UA: NEGATIVE mg/dL
Nitrite, UA: NEGATIVE
Spec Grav, UA: 1.025 (ref 1.010–1.025)
Urobilinogen, UA: 0.2 E.U./dL
pH, UA: 5.5 (ref 5.0–8.0)

## 2020-09-28 LAB — POCT UA - MICROSCOPIC ONLY

## 2020-09-28 LAB — POCT GLYCOSYLATED HEMOGLOBIN (HGB A1C): Hemoglobin A1C: 7.6 % — AB (ref 4.0–5.6)

## 2020-09-28 MED ORDER — METFORMIN HCL ER 500 MG PO TB24: ORAL_TABLET | ORAL | 3 refills | Status: DC

## 2020-09-28 NOTE — Patient Instructions (Addendum)
Since your blood pressure is good, stay off the amlodipine.  Please check your blood pressure at home.  The goal is to have most readings 140/90 or less.   For the diabetes, take two metformin each morning and one at night.  Let me know if your have stomach problems at the higher dose.   I encourage you to get your COVID booster soon.  Call for a nurse visit if you want to get it here.  I will call with lab test results. See me in three months.

## 2020-09-28 NOTE — Assessment & Plan Note (Signed)
I believe her right flank pain is MSK, not renal.  Check creat and UA

## 2020-09-28 NOTE — Assessment & Plan Note (Signed)
Check lipid panel  

## 2020-09-28 NOTE — Assessment & Plan Note (Signed)
OK to stay off amlodipine.  Check home blood pressures.

## 2020-09-28 NOTE — Assessment & Plan Note (Signed)
Increase metformin to 1000 qam and 500 at dinner.

## 2020-09-28 NOTE — Progress Notes (Signed)
    SUBJECTIVE:   CHIEF COMPLAINT / HPI:   Diabetes and more A1C not at goal.  Could not tolerate higher dose of immediate release metformin due to GI side effects.  Tolerating extended release fine.  Willing to try and increase. Hypertension.  Has not taken amlodipine for several days.  BP fine.  Would like to try staying off. Undecided about COVID booster.  We discussed, I encouraged.  She still says not today. Right sided flank pain.  No urgency frequency or dysuria.  Hurts when she moves.  Worried it may be her kidneys. States she recently had colonoscopy by Dr. Benson Norway.  I have yet to see the results.  She was told more polyps and will need repeat in three years.      OBJECTIVE:   BP 128/68   Pulse 87   Ht 5\' 7"  (1.702 m)   Wt 247 lb 9.6 oz (112.3 kg)   SpO2 96%   BMI 38.78 kg/m  Weight loss noted. Lungs clear Cardiac RRR without m or g Abd, no CVA tenderness nor tenderness to bimanual of kidneys. Diabetic foot exam done.  ASSESSMENT/PLAN:   Type 2 diabetes mellitus with ophthalmic complication (HCC) Increase metformin to 1000 qam and 500 at dinner.  Primary hypertension OK to stay off amlodipine.  Check home blood pressures.  ARTHRITIS, DEGENERATIVE I believe her right flank pain is MSK, not renal.  Check creat and UA  Hyperlipidemia Check lipid panel     [name], MD [place]

## 2020-09-29 LAB — BASIC METABOLIC PANEL
BUN/Creatinine Ratio: 18 (ref 12–28)
BUN: 15 mg/dL (ref 8–27)
CO2: 27 mmol/L (ref 20–29)
Calcium: 10.4 mg/dL — ABNORMAL HIGH (ref 8.7–10.3)
Chloride: 99 mmol/L (ref 96–106)
Creatinine, Ser: 0.83 mg/dL (ref 0.57–1.00)
GFR calc Af Amer: 80 mL/min/{1.73_m2} (ref >59)
GFR calc non Af Amer: 70 mL/min/{1.73_m2} (ref >59)
Glucose: 98 mg/dL (ref 65–99)
Potassium: 3.6 mmol/L (ref 3.5–5.2)
Sodium: 142 mmol/L (ref 134–144)

## 2020-09-29 LAB — LIPID PANEL
Chol/HDL Ratio: 2.3 ratio (ref 0.0–4.4)
Cholesterol, Total: 95 mg/dL — ABNORMAL LOW (ref 100–199)
HDL: 41 mg/dL (ref >39)
LDL Chol Calc (NIH): 31 mg/dL (ref 0–99)
Triglycerides: 128 mg/dL (ref 0–149)
VLDL Cholesterol Cal: 23 mg/dL (ref 5–40)

## 2020-10-07 ENCOUNTER — Other Ambulatory Visit: Payer: Self-pay | Admitting: Family Medicine

## 2020-10-09 ENCOUNTER — Other Ambulatory Visit: Payer: Self-pay | Admitting: Family Medicine

## 2020-10-23 ENCOUNTER — Other Ambulatory Visit: Payer: Self-pay | Admitting: Family Medicine

## 2020-10-23 DIAGNOSIS — E119 Type 2 diabetes mellitus without complications: Secondary | ICD-10-CM

## 2020-11-07 ENCOUNTER — Other Ambulatory Visit: Payer: Self-pay | Admitting: Family Medicine

## 2020-11-16 ENCOUNTER — Other Ambulatory Visit: Payer: Self-pay

## 2020-11-16 ENCOUNTER — Encounter (INDEPENDENT_AMBULATORY_CARE_PROVIDER_SITE_OTHER): Payer: Self-pay | Admitting: Ophthalmology

## 2020-11-16 ENCOUNTER — Ambulatory Visit (INDEPENDENT_AMBULATORY_CARE_PROVIDER_SITE_OTHER): Payer: Medicare Other | Admitting: Ophthalmology

## 2020-11-21 ENCOUNTER — Encounter (INDEPENDENT_AMBULATORY_CARE_PROVIDER_SITE_OTHER): Payer: Self-pay | Admitting: Ophthalmology

## 2020-11-21 ENCOUNTER — Ambulatory Visit (INDEPENDENT_AMBULATORY_CARE_PROVIDER_SITE_OTHER): Payer: Medicare Other | Admitting: Ophthalmology

## 2020-11-21 ENCOUNTER — Other Ambulatory Visit: Payer: Self-pay

## 2020-11-21 DIAGNOSIS — Z794 Long term (current) use of insulin: Secondary | ICD-10-CM | POA: Diagnosis not present

## 2020-11-21 DIAGNOSIS — H353112 Nonexudative age-related macular degeneration, right eye, intermediate dry stage: Secondary | ICD-10-CM

## 2020-11-21 DIAGNOSIS — E11319 Type 2 diabetes mellitus with unspecified diabetic retinopathy without macular edema: Secondary | ICD-10-CM | POA: Diagnosis not present

## 2020-11-21 DIAGNOSIS — H353221 Exudative age-related macular degeneration, left eye, with active choroidal neovascularization: Secondary | ICD-10-CM

## 2020-11-21 DIAGNOSIS — H35371 Puckering of macula, right eye: Secondary | ICD-10-CM

## 2020-11-21 MED ORDER — BEVACIZUMAB 2.5 MG/0.1ML IZ SOSY
2.5000 mg | PREFILLED_SYRINGE | INTRAVITREAL | Status: AC | PRN
  Administered 2020-11-21: 2.5 mg via INTRAVITREAL

## 2020-11-21 NOTE — Assessment & Plan Note (Signed)
Central foveal scarring with intraretinal fluid and subretinal fluid remaining here at 10-week interval.  We will repeat injection today and examination again in 9 weeks.  Disciform scar limits acuity thus our goal is to prevent scotoma enlargement with treatment while decreasing treatment burden

## 2020-11-21 NOTE — Assessment & Plan Note (Signed)
The nature of macular pucker (epiretinal membrane ERM) was discussed with the patient as well as threshold criteria for vitrectomy surgery. I explained that in rare cases another surgery is needed to actually remove a second wrinkle should it regrow.  Most often, the epiretinal membrane and underlying wrinkled internal limiting membrane are removed with the first surgery, to accomplish the goals.   If the operative eye is [place] (natural lens still present), cataract surgery is often recommended prior to Vitrectomy. This will enable the retina surgeon to have the best view during surgery and the patient to obtain optimal results in the future. Treatment options were discussed. Severe topographic distortion of the macula right eye yet with good acuity, stable for years we will continue to observe

## 2020-11-21 NOTE — Assessment & Plan Note (Signed)
No high risk features, no CNVM OD

## 2020-11-21 NOTE — Assessment & Plan Note (Signed)
No DR detected 

## 2020-11-21 NOTE — Progress Notes (Signed)
11/21/2020     CHIEF COMPLAINT Patient presents for Retina Follow Up (10 Week Wet AMD f\u OS. Possible Avastin OS. OCT/Pt states no changes in vision. Denies new complaints./BGL: 104)   HISTORY OF PRESENT ILLNESS: Kaitlyn Ferguson is a 75 y.o. female who presents to the clinic today for:   HPI    Retina Follow Up    Patient presents with  Wet AMD.  In left eye.  Severity is moderate.  Duration of 10 weeks.  Since onset it is stable.  I, the attending physician,  performed the HPI with the patient and updated documentation appropriately. Additional comments: 10 Week Wet AMD f\u OS. Possible Avastin OS. OCT Pt states no changes in vision. Denies new complaints. BGL: 104       Last edited by Tilda Franco on 11/21/2020  9:36 AM. (History)      Referring physician: [name], [place],  [place] [id number]  HISTORICAL INFORMATION:   Selected notes from the MEDICAL RECORD NUMBER    Lab Results  Component Value Date   HGBA1C 7.6 (A) 09/28/2020     CURRENT MEDICATIONS: No current outpatient medications on file. (Ophthalmic Drugs)   No current facility-administered medications for this visit. (Ophthalmic Drugs)   Current Outpatient Medications (Other)  Medication Sig  . Insulin Pen Needle (B-D UF III MINI PEN NEEDLES) 31G X 5 MM MISC USE TWICE DAILY  . Blood Glucose Monitoring Suppl (ONETOUCH VERIO FLEX SYSTEM) w/Device KIT 1 application by Does not apply route 2 (two) times daily.  . Blood Glucose Monitoring Suppl (ONETOUCH VERIO) w/Device KIT Check blood sugars 2 times daily. Code: E11.9  . cholecalciferol (VITAMIN D3) 25 MCG (1000 UNIT) tablet Take 1,000 Units by mouth daily. (Patient not taking: Reported on 08/29/2020)  . diclofenac Sodium (VOLTAREN) 1 % GEL Apply 2 g topically 4 (four) times daily. (Patient not taking: No sig reported)  . glucose blood (ONETOUCH VERIO) test strip USE TO TEST BLOOD SUGAR TWICE DAILY OR AS DIRECTED  .  insulin degludec (TRESIBA FLEXTOUCH) 100 UNIT/ML FlexTouch Pen Inject 62 Units into the skin daily.  [employer] Devices (ONE TOUCH DELICA LANCING DEV) MISC Check blood sugars 2 times daily. Code: E11.9  . Lancets (FREESTYLE) lancets 1 each by Other route 2 (two) times daily. Use as instructed  . Lancets (ONETOUCH DELICA PLUS [id number]) MISC USE TO TEST BLOOD SUGAR TWICE DAILY  . levothyroxine (SYNTHROID) 88 MCG tablet TAKE 1 TABLET BY MOUTH EVERY DAY BEFORE BREAKFAST  . lisinopril-hydrochlorothiazide (ZESTORETIC) 20-12.5 MG tablet TAKE 1 TABLET BY MOUTH DAILY  . metFORMIN (GLUCOPHAGE-XR) 500 MG 24 hr tablet 2 pills with breakfast and 1 pill with dinner.  . Multiple Vitamin (MULTIVITAMIN WITH MINERALS) TABS tablet Take 1 tablet by mouth daily. With Vitamin D3  . rosuvastatin (CRESTOR) 5 MG tablet Take 5 mg by mouth daily.  . Semaglutide, 1 MG/DOSE, 2 MG/1.5ML SOPN Inject 1 mg into the skin once a week.   No current facility-administered medications for this visit. (Other)      REVIEW OF SYSTEMS: ROS    Positive for: Endocrine   Last edited by Tilda Franco on 11/21/2020  9:36 AM. (History)       ALLERGIES Allergies  Allergen Reactions  . Gabapentin Nausea Only  . Vancomycin Itching    Pre-medicated with benadryl and tylenol 1 hr before administration of vanc    PAST MEDICAL HISTORY Past Medical History:  Diagnosis Date  .  Anatomical narrow angle glaucoma with borderline intraocular pressure, right 02/01/2020  . Arthritis   . Diabetes mellitus   . DJD (degenerative joint disease) 10/07/2008  . DJD (degenerative joint disease)   . Family history of adverse reaction to anesthesia    MOTHER WAS DIFFICULT TO WAKE UP  . Hyperlipidemia   . Hypertension   . Hypothyroidism   . Nausea    INTERMITANT - TAKES PRILOSEC (DENIES REFLUX)  . Obesity   . Osteomyelitis of ankle (HCC)    Complications of Right ankle injury   Past Surgical History:  Procedure Laterality Date  .  ABDOMINAL HYSTERECTOMY    . ANKLE FUSION  02/1996   Right ankle tib/talar  . BACK SURGERY  1984  . BREAST EXCISIONAL BIOPSY Right   . CARPAL TUNNEL RELEASE  1993   Right arm  . CHOLECYSTECTOMY    . EYE SURGERY Left   . NECK MASS EXCISION  12/29/2001  . right eye laser    . TONSILLECTOMY     Childhood  . TOTAL KNEE ARTHROPLASTY Left 12/21/2015   Procedure: LEFT TOTAL KNEE ARTHROPLASTY WITH COMPLEX COMPUTER NAVIGATION ;  Surgeon: Rod Can, MD;  Location: [facility];  Service: Orthopedics;  Laterality: Left;  . TOTAL SHOULDER ARTHROPLASTY Right 07/11/2016   Procedure: RIGHT TOTAL SHOULDER ARTHROPLASTY;  Surgeon: Justice Britain, MD;  Location: [facility];  Service: Orthopedics;  Laterality: Right;  . TOTAL SHOULDER ARTHROPLASTY Left 11/04/2019   Procedure: TOTAL SHOULDER ARTHROPLASTY;  Surgeon: Justice Britain, MD;  Location: [facility];  Service: Orthopedics;  Laterality: Left;  180mn    FAMILY HISTORY Family History  Problem Relation Age of Onset  . Diabetes Brother   . Diabetes Sister        DM2-lost weight-now okay  . Heart attack Father        First MI- early 528's . Stroke Mother   . Heart failure Mother   . Multiple myeloma Mother        Deceased at 770due to same  . Breast cancer Maternal Aunt   . Heart attack Son     SOCIAL HISTORY Social History   Tobacco Use  . Smoking status: Never Smoker  . Smokeless tobacco: Never Used  . Tobacco comment: No tobacco currently -   Vaping Use  . Vaping Use: Never used  Substance Use Topics  . Alcohol use: No    Alcohol/week: 0.0 standard drinks  . Drug use: No         OPHTHALMIC EXAM: Base Eye Exam    Visual Acuity (Snellen - Linear)      Right Left   Dist [place] 20/40 -2 E Card @ 5'   Dist ph [place] 20/25 -1 NI       Tonometry (Tonopen, 9:40 AM)      Right Left   Pressure 15 11       Pupils      Pupils Dark Light Shape React APD   Right PERRL 4 3 Round Slow None   Left PERRL 4 3 Round Slow None       Visual Fields  (Counting fingers)      Left Right    Full Full       Neuro/Psych    Oriented x3: Yes   Mood/Affect: Normal       Dilation    Left eye: 1.0% Mydriacyl, 2.5% Phenylephrine @ 9:40 AM        Slit Lamp and Fundus Exam  External Exam      Right Left   External Normal Normal       Slit Lamp Exam      Right Left   Lids/Lashes Normal Normal   Conjunctiva/Sclera White and quiet White and quiet   Cornea Clear Clear   Anterior Chamber Deep and quiet Deep and quiet   Iris Round and reactive Round and reactive   Lens Posterior chamber intraocular lens, Centered posterior chamber intraocular lens Posterior chamber intraocular lens, Centered posterior chamber intraocular lens   Anterior Vitreous Normal Normal       Fundus Exam      Right Left   Posterior Vitreous  Posterior vitreous detachment, Central vitreous floaters   Disc  Normal   C/D Ratio  0.35   Macula  Intraretinal hemorrhage, Retinal pigment epithelial mottling, Disciform scar   Vessels  no DR   Periphery  Normal          IMAGING AND PROCEDURES  Imaging and Procedures for 11/21/20  OCT, Retina - OU - Both Eyes       Right Eye Quality was good. Scan locations included subfoveal. Central Foveal Thickness: 288. Progression has been stable. Findings include epiretinal membrane, abnormal foveal contour.   Left Eye Quality was good. Scan locations included subfoveal. Central Foveal Thickness: 334. Progression has improved. Findings include cystoid macular edema, disciform scar, subretinal fluid, subretinal hyper-reflective material, abnormal foveal contour.   Notes Subretinal scarring, disciform scar, slight increase subretinal fluid, today at 10 -week follow-up OS.  We will repeat injection today and may decrease interval of the evaluation to 9 weeks  OD with irregular epiretinal membrane foveal distortion yet stable over time with no outer retinal changes.  We will continue to monitor and observe        Intravitreal Injection, Pharmacologic Agent - OS - Left Eye       Time Out 11/21/2020. 10:12 AM. Confirmed correct patient, procedure, site, and patient consented.   Anesthesia Topical anesthesia was used. Anesthetic medications included [place] 3.5%.   Procedure Preparation included Tobramycin 0.3%, 10% betadine to eyelids, 5% betadine to ocular surface. A supplied needle was used.   Injection:  2.5 mg Bevacizumab (AVASTIN) 2.60m/0.1mL SOSY   NDC:: 03888-280-03 Lot:: [id number]  Route: Intravitreal, Site: Left Eye  Post-op Post injection exam found visual acuity of at least counting fingers. The patient tolerated the procedure well. There were no complications. The patient received written and verbal post procedure care education. Post injection medications were not given.                 ASSESSMENT/PLAN:  Right epiretinal membrane The nature of macular pucker (epiretinal membrane ERM) was discussed with the patient as well as threshold criteria for vitrectomy surgery. I explained that in rare cases another surgery is needed to actually remove a second wrinkle should it regrow.  Most often, the epiretinal membrane and underlying wrinkled internal limiting membrane are removed with the first surgery, to accomplish the goals.   If the operative eye is [place] (natural lens still present), cataract surgery is often recommended prior to Vitrectomy. This will enable the retina surgeon to have the best view during surgery and the patient to obtain optimal results in the future. Treatment options were discussed. Severe topographic distortion of the macula right eye yet with good acuity, stable for years we will continue to observe  Intermediate stage nonexudative age-related macular degeneration of right eye No high risk features, no CNVM OD  Type 2 diabetes mellitus with ophthalmic complication (HCC) No DR detected  Exudative age-related macular degeneration of left eye with active  choroidal neovascularization (HCC) Central foveal scarring with intraretinal fluid and subretinal fluid remaining here at 10-week interval.  We will repeat injection today and examination again in 9 weeks.  Disciform scar limits acuity thus our goal is to prevent scotoma enlargement with treatment while decreasing treatment burden      ICD-10-CM   1. Exudative age-related macular degeneration of left eye with active choroidal neovascularization (HCC)  H35.3221 OCT, Retina - OU - Both Eyes    Intravitreal Injection, Pharmacologic Agent - OS - Left Eye    bevacizumab (AVASTIN) SOSY 2.5 mg  2. Right epiretinal membrane  H35.371   3. Intermediate stage nonexudative age-related macular degeneration of right eye  H35.3112   4. Type 2 diabetes mellitus with retinopathy, with long-term current use of insulin, macular edema presence unspecified, unspecified laterality, unspecified retinopathy severity ([place])  E11.319    Z79.4     1.  Repeat injection intravitreal Avastin OS today to prevent scotoma enlargement from disciform subfoveal scar.  2.  Observe OD, epiretinal membrane with no visual acuity impairment observe  3.  Ophthalmic Meds Ordered this visit:  Meds ordered this encounter  Medications  . bevacizumab (AVASTIN) SOSY 2.5 mg       Return in about 9 weeks (around 01/23/2021) for DILATE OU, AVASTIN OCT, OS.  There are no Patient Instructions on file for this visit.   Explained the diagnoses, plan, and follow up with the patient and they expressed understanding.  Patient expressed understanding of the importance of proper follow up care.   Clent Demark Daulton Harbaugh M.D. Diseases & Surgery of the Retina and Vitreous [facility] [place] 11/21/20     Abbreviations: M myopia (nearsighted); A astigmatism; H hyperopia (farsighted); P presbyopia; Mrx spectacle prescription;  CTL contact lenses; OD right eye; OS left eye; OU both eyes  XT exotropia; ET esotropia; PEK punctate epithelial  keratitis; PEE punctate epithelial erosions; DES dry eye syndrome; MGD meibomian gland dysfunction; ATs artificial tears; PFAT's preservative free artificial tears; [place] nuclear sclerotic cataract; PSC posterior subcapsular cataract; ERM epi-retinal membrane; PVD posterior vitreous detachment; RD retinal detachment; DM diabetes mellitus; DR diabetic retinopathy; NPDR non-proliferative diabetic retinopathy; PDR proliferative diabetic retinopathy; CSME clinically significant macular edema; DME diabetic macular edema; dbh dot blot hemorrhages; CWS cotton wool spot; POAG primary open angle glaucoma; C/D cup-to-disc ratio; HVF humphrey visual field; GVF goldmann visual field; OCT optical coherence tomography; IOP intraocular pressure; BRVO Branch retinal vein occlusion; CRVO central retinal vein occlusion; CRAO central retinal artery occlusion; BRAO branch retinal artery occlusion; RT retinal tear; SB scleral buckle; PPV pars plana vitrectomy; VH Vitreous hemorrhage; PRP panretinal laser photocoagulation; IVK intravitreal kenalog; VMT vitreomacular traction; MH Macular hole;  NVD neovascularization of the disc; NVE neovascularization elsewhere; AREDS age related eye disease study; ARMD age related macular degeneration; POAG primary open angle glaucoma; EBMD epithelial/anterior basement membrane dystrophy; ACIOL anterior chamber intraocular lens; IOL intraocular lens; PCIOL posterior chamber intraocular lens; Phaco/IOL phacoemulsification with intraocular lens placement; [place] photorefractive keratectomy; LASIK laser assisted in situ keratomileusis; HTN hypertension; DM diabetes mellitus; COPD chronic obstructive pulmonary disease

## 2020-12-11 DIAGNOSIS — H40021 Open angle with borderline findings, high risk, right eye: Secondary | ICD-10-CM | POA: Diagnosis not present

## 2020-12-11 DIAGNOSIS — E119 Type 2 diabetes mellitus without complications: Secondary | ICD-10-CM | POA: Diagnosis not present

## 2020-12-11 DIAGNOSIS — Z9883 Filtering (vitreous) bleb after glaucoma surgery status: Secondary | ICD-10-CM | POA: Diagnosis not present

## 2020-12-11 DIAGNOSIS — H401122 Primary open-angle glaucoma, left eye, moderate stage: Secondary | ICD-10-CM | POA: Diagnosis not present

## 2020-12-11 DIAGNOSIS — Z794 Long term (current) use of insulin: Secondary | ICD-10-CM | POA: Diagnosis not present

## 2020-12-11 DIAGNOSIS — H353111 Nonexudative age-related macular degeneration, right eye, early dry stage: Secondary | ICD-10-CM | POA: Diagnosis not present

## 2020-12-11 DIAGNOSIS — H353221 Exudative age-related macular degeneration, left eye, with active choroidal neovascularization: Secondary | ICD-10-CM | POA: Diagnosis not present

## 2020-12-11 DIAGNOSIS — Z961 Presence of intraocular lens: Secondary | ICD-10-CM | POA: Diagnosis not present

## 2020-12-25 ENCOUNTER — Ambulatory Visit: Payer: Medicare Other | Admitting: Family Medicine

## 2020-12-31 ENCOUNTER — Other Ambulatory Visit: Payer: Self-pay | Admitting: Family Medicine

## 2021-01-11 ENCOUNTER — Encounter: Payer: Self-pay | Admitting: Family Medicine

## 2021-01-11 ENCOUNTER — Ambulatory Visit (INDEPENDENT_AMBULATORY_CARE_PROVIDER_SITE_OTHER): Payer: Medicare Other | Admitting: Family Medicine

## 2021-01-11 ENCOUNTER — Other Ambulatory Visit: Payer: Self-pay

## 2021-01-11 VITALS — BP 130/74 | HR 63 | Ht 67.0 in | Wt 235.6 lb

## 2021-01-11 DIAGNOSIS — M159 Polyosteoarthritis, unspecified: Secondary | ICD-10-CM | POA: Diagnosis not present

## 2021-01-11 DIAGNOSIS — Z6379 Other stressful life events affecting family and household: Secondary | ICD-10-CM | POA: Insufficient documentation

## 2021-01-11 DIAGNOSIS — M47817 Spondylosis without myelopathy or radiculopathy, lumbosacral region: Secondary | ICD-10-CM

## 2021-01-11 DIAGNOSIS — Z794 Long term (current) use of insulin: Secondary | ICD-10-CM | POA: Diagnosis not present

## 2021-01-11 DIAGNOSIS — E11319 Type 2 diabetes mellitus with unspecified diabetic retinopathy without macular edema: Secondary | ICD-10-CM

## 2021-01-11 DIAGNOSIS — E119 Type 2 diabetes mellitus without complications: Secondary | ICD-10-CM | POA: Diagnosis not present

## 2021-01-11 DIAGNOSIS — M545 Low back pain, unspecified: Secondary | ICD-10-CM | POA: Diagnosis not present

## 2021-01-11 LAB — POCT GLYCOSYLATED HEMOGLOBIN (HGB A1C): HbA1c, POC (controlled diabetic range): 6.5 % (ref 0.0–7.0)

## 2021-01-11 LAB — POCT UA - MICROSCOPIC ONLY

## 2021-01-11 LAB — POCT URINALYSIS DIP (MANUAL ENTRY)
Bilirubin, UA: NEGATIVE
Glucose, UA: NEGATIVE mg/dL
Nitrite, UA: NEGATIVE
Protein Ur, POC: 30 mg/dL — AB
Spec Grav, UA: 1.025 (ref 1.010–1.025)
Urobilinogen, UA: 0.2 E.U./dL
pH, UA: 6 (ref 5.0–8.0)

## 2021-01-11 MED ORDER — METFORMIN HCL ER 500 MG PO TB24: 500.0000 mg | ORAL_TABLET | Freq: Two times a day (BID) | ORAL | 3 refills | Status: DC

## 2021-01-11 NOTE — Progress Notes (Signed)
    SUBJECTIVE:   CHIEF COMPLAINT / HPI:   Multiple issues 1. DM  Doing great on Ozempic.  Has lost weight.  Not as hungry.  Exercising more.  A1C today=6.5. 2. Rt lumbar flank pain worsening over last 4-6 weeks.  No fever, urgency or frequency.  No personal hx of cancer.  No trauma.  Hurts to move.  Tylenol helps.  Known back arthritis with previous back surg 37 years ago.  This feels different. 3. Increased stress.  Only daughter (several sons) recently dxed with stage 4 lung cancer.  Obviously terrible news.  She is leaning hard on her strong faith to get her through.    OBJECTIVE:   BP 130/74   Pulse 63   Ht 5\' 7"  (1.702 m)   Wt 235 lb 9.6 oz (106.9 kg)   SpO2 98%   BMI 36.90 kg/m   Lungs clear Cardiac RRR without m or g Back Right paraspinous lumbar pain around T3  UA and micro, not convincing for UTI.  Lack of blood makes kidney stone unlikely.  ASSESSMENT/PLAN:   Type 2 diabetes mellitus with ophthalmic complication (HCC) Great control.  A nice bright spot for her considering bad news about daughter.  Lumbar and sacral osteoarthritis Likely a acute back pain is MSK in origin.  Will get urine culture to RO UTI.  Also get LS spine plain films to RO compression Fracture or pathologic fracture.  Continue tylenol.  Stress due to illness of family member Serious illness of only daughter is a huge stressor.  It looks like she will end up being a major caregiver for her daughter if/when the lung cancer progresses.       [name], MD [place]

## 2021-01-11 NOTE — Patient Instructions (Signed)
Likely the pain is from your back arthritis.  Go ahead and take tylenol as needed. I will call with the xray and urine culture results.   If the pain continues to get worse, I may need to do more testing.   I am very sorry about your daughter. I hope things improve.  I am glad you have a strong faith.

## 2021-01-11 NOTE — Assessment & Plan Note (Signed)
Likely a acute back pain is MSK in origin.  Will get urine culture to RO UTI.  Also get LS spine plain films to RO compression Fracture or pathologic fracture.  Continue tylenol.

## 2021-01-11 NOTE — Assessment & Plan Note (Signed)
Great control.  A nice bright spot for her considering bad news about daughter.

## 2021-01-11 NOTE — Assessment & Plan Note (Signed)
Serious illness of only daughter is a huge stressor.  It looks like she will end up being a major caregiver for her daughter if/when the lung cancer progresses.

## 2021-01-15 ENCOUNTER — Ambulatory Visit
Admission: RE | Admit: 2021-01-15 | Discharge: 2021-01-15 | Disposition: A | Discharge status: Home / Self Care | Payer: Medicare Other | Source: Ambulatory Visit | Attending: Family Medicine | Admitting: Family Medicine

## 2021-01-15 DIAGNOSIS — M545 Low back pain, unspecified: Secondary | ICD-10-CM | POA: Diagnosis not present

## 2021-01-15 DIAGNOSIS — M159 Polyosteoarthritis, unspecified: Secondary | ICD-10-CM

## 2021-01-16 ENCOUNTER — Encounter: Payer: Self-pay | Admitting: Family Medicine

## 2021-01-16 LAB — URINE CULTURE

## 2021-01-18 ENCOUNTER — Other Ambulatory Visit: Payer: Self-pay | Admitting: Family Medicine

## 2021-01-18 ENCOUNTER — Other Ambulatory Visit: Payer: Self-pay

## 2021-01-18 MED ORDER — ONETOUCH VERIO W/DEVICE KIT: PACK | 0 refills | Status: AC

## 2021-01-18 MED ORDER — ONETOUCH VERIO VI STRP: ORAL_STRIP | 6 refills | Status: DC

## 2021-01-23 ENCOUNTER — Other Ambulatory Visit: Payer: Self-pay

## 2021-01-23 ENCOUNTER — Encounter (INDEPENDENT_AMBULATORY_CARE_PROVIDER_SITE_OTHER): Payer: Self-pay | Admitting: Ophthalmology

## 2021-01-23 ENCOUNTER — Ambulatory Visit (INDEPENDENT_AMBULATORY_CARE_PROVIDER_SITE_OTHER): Payer: Medicare Other | Admitting: Ophthalmology

## 2021-01-23 DIAGNOSIS — H353221 Exudative age-related macular degeneration, left eye, with active choroidal neovascularization: Secondary | ICD-10-CM | POA: Diagnosis not present

## 2021-01-23 MED ORDER — BEVACIZUMAB 2.5 MG/0.1ML IZ SOSY
2.5000 mg | PREFILLED_SYRINGE | INTRAVITREAL | Status: AC | PRN
  Administered 2021-01-23: 2.5 mg via INTRAVITREAL

## 2021-01-23 NOTE — Assessment & Plan Note (Signed)

## 2021-01-23 NOTE — Progress Notes (Signed)
01/23/2021     CHIEF COMPLAINT Patient presents for Retina Follow Up (9 week fu OU and Avastin OS/Pt states VA OU stable since last visit. Pt denies FOL, floaters, or ocular pain OU. Kaitlyn Ferguson: 6.5/LBS: 122/)   HISTORY OF PRESENT ILLNESS: Kaitlyn Ferguson is a 75 y.o. female who presents to the clinic today for:   HPI    Retina Follow Up    Diagnosis: Wet AMD   Laterality: left eye   Onset: 9 weeks ago   Severity: mild   Duration: 9 weeks   Course: stable   Comments: 9 week fu OU and Avastin OS Pt states VA OU stable since last visit. Pt denies FOL, floaters, or ocular pain OU.  A1C: 6.5 LBS: 122        Last edited by Kendra Opitz, COA on 01/23/2021  9:58 AM. (History)      Referring physician: Zenia Ferguson, [place],  Kaitlyn Ferguson [id number]  HISTORICAL INFORMATION:   Selected notes from the MEDICAL RECORD NUMBER    Lab Results  Component Value Date   HGBA1C 6.5 01/11/2021     CURRENT MEDICATIONS: No current outpatient medications on file. (Ophthalmic Drugs)   No current facility-administered medications for this visit. (Ophthalmic Drugs)   Current Outpatient Medications (Other)  Medication Sig  . Blood Glucose Monitoring Suppl (ONETOUCH VERIO FLEX SYSTEM) w/Device KIT 1 application by Does not apply route 2 (two) times daily.  . Blood Glucose Monitoring Suppl (ONETOUCH VERIO) w/Device KIT Check blood sugars 2 times daily. Code: E11.9  . diclofenac Sodium (VOLTAREN) 1 % GEL Apply 2 g topically 4 (four) times daily. (Patient not taking: No sig reported)  . glucose blood (ONETOUCH VERIO) test strip USE TO TEST BLOOD SUGAR TWICE DAILY OR AS DIRECTED  . insulin degludec (TRESIBA FLEXTOUCH) 100 UNIT/ML FlexTouch Pen Inject 62 Units into the skin daily.  . Insulin Pen Needle (B-D UF III MINI PEN NEEDLES) 31G X 5 MM MISC USE WITH INSULIN TWICE DAILY  . Lancet Devices (ONE TOUCH DELICA LANCING DEV) MISC Check blood sugars 2 times daily.  Code: E11.9  . Lancets (FREESTYLE) lancets 1 each by Other route 2 (two) times daily. Use as instructed  . Lancets (ONETOUCH DELICA PLUS [id number]) MISC USE TO TEST BLOOD SUGAR TWICE DAILY  . levothyroxine (SYNTHROID) 88 MCG tablet TAKE 1 TABLET BY MOUTH EVERY DAY BEFORE BREAKFAST  . lisinopril-hydrochlorothiazide (ZESTORETIC) 20-12.5 MG tablet TAKE 1 TABLET BY MOUTH DAILY  . metFORMIN (GLUCOPHAGE-XR) 500 MG 24 hr tablet Take 1 tablet (500 mg total) by mouth in the morning and at bedtime.  . Multiple Vitamin (MULTIVITAMIN WITH MINERALS) TABS tablet Take 1 tablet by mouth daily. With Vitamin D3  . rosuvastatin (CRESTOR) 5 MG tablet Take 5 mg by mouth daily.  . Semaglutide, 1 MG/DOSE, 2 MG/1.5ML SOPN Inject 1 mg into the skin once a week.   No current facility-administered medications for this visit. (Other)      REVIEW OF SYSTEMS:    ALLERGIES Allergies  Allergen Reactions  . Gabapentin Nausea Only  . Vancomycin Itching    Pre-medicated with benadryl and tylenol 1 hr before administration of vanc    PAST MEDICAL HISTORY Past Medical History:  Diagnosis Date  . Anatomical narrow angle glaucoma with borderline intraocular pressure, right 02/01/2020  . Arthritis   . Diabetes mellitus   . DJD (degenerative joint disease) 10/07/2008  . DJD (degenerative joint disease)   . Family history  of adverse reaction to anesthesia    MOTHER WAS DIFFICULT TO WAKE UP  . Hyperlipidemia   . Hypertension   . Hypothyroidism   . Nausea    INTERMITANT - TAKES PRILOSEC (DENIES REFLUX)  . Obesity   . Osteomyelitis of ankle (HCC)    Complications of Right ankle injury   Past Surgical History:  Procedure Laterality Date  . ABDOMINAL HYSTERECTOMY    . ANKLE FUSION  02/1996   Right ankle tib/talar  . BACK SURGERY  1984  . BREAST EXCISIONAL BIOPSY Right   . CARPAL TUNNEL RELEASE  1993   Right arm  . CHOLECYSTECTOMY    . EYE SURGERY Left   . NECK MASS EXCISION  12/29/2001  . right eye laser     . TONSILLECTOMY     Childhood  . TOTAL KNEE ARTHROPLASTY Left 12/21/2015   Procedure: LEFT TOTAL KNEE ARTHROPLASTY WITH COMPLEX COMPUTER NAVIGATION ;  Surgeon: Rod Can, MD;  Location: [facility];  Service: Orthopedics;  Laterality: Left;  . TOTAL SHOULDER ARTHROPLASTY Right 07/11/2016   Procedure: RIGHT TOTAL SHOULDER ARTHROPLASTY;  Surgeon: Justice Britain, MD;  Location: [facility];  Service: Orthopedics;  Laterality: Right;  . TOTAL SHOULDER ARTHROPLASTY Left 11/04/2019   Procedure: TOTAL SHOULDER ARTHROPLASTY;  Surgeon: Justice Britain, MD;  Location: [facility];  Service: Orthopedics;  Laterality: Left;  124mn    FAMILY HISTORY Family History  Problem Relation Age of Onset  . Diabetes Brother   . Diabetes Sister        DM2-lost weight-now okay  . Heart attack Father        First MI- early 530's . Stroke Mother   . Heart failure Mother   . Multiple myeloma Mother        Deceased at 769due to same  . Breast cancer Maternal Aunt   . Heart attack Son     SOCIAL HISTORY Social History   Tobacco Use  . Smoking status: Never Smoker  . Smokeless tobacco: Never Used  . Tobacco comment: No tobacco currently -   Vaping Use  . Vaping Use: Never used  Substance Use Topics  . Alcohol use: No    Alcohol/week: 0.0 standard drinks  . Drug use: No         OPHTHALMIC EXAM:  Base Eye Exam    Visual Acuity (ETDRS)      Right Left   Dist [place] 20/30 -1 CF at 1'   Dist ph  20/25 -1        Tonometry (Tonopen, 10:01 AM)      Right Left   Pressure 14 12       Pupils      Pupils Dark Light Shape React APD   Right PERRL 4 3 Round Slow None   Left PERRL 4 3 Round Slow None       Visual Fields (Counting fingers)      Left Right    Full Full       Extraocular Movement      Right Left    Full Full       Neuro/Psych    Oriented x3: Yes   Mood/Affect: Normal       Dilation    Both eyes: 1.0% Mydriacyl, 2.5% Phenylephrine @ 10:01 AM        Slit Lamp and Fundus Exam     External Exam      Right Left   External Normal Normal  Slit Lamp Exam      Right Left   Lids/Lashes Normal Normal   Conjunctiva/Sclera White and quiet White and quiet   Cornea Clear Clear   Anterior Chamber Deep and quiet Deep and quiet   Iris Round and reactive Round and reactive   Lens Posterior chamber intraocular lens, Centered posterior chamber intraocular lens Posterior chamber intraocular lens, Centered posterior chamber intraocular lens   Anterior Vitreous Normal Normal       Fundus Exam      Right Left   Posterior Vitreous  Posterior vitreous detachment, Central vitreous floaters   Disc  Normal   C/D Ratio  0.35   Macula  , Retinal pigment epithelial mottling, Disciform scar   Vessels  no DR   Periphery  Normal          IMAGING AND PROCEDURES  Imaging and Procedures for 01/23/21  OCT, Retina - OU - Both Eyes       Right Eye Quality was good. Scan locations included subfoveal. Central Foveal Thickness: 288. Progression has been stable. Findings include epiretinal membrane, abnormal foveal contour.   Left Eye Quality was good. Scan locations included subfoveal. Central Foveal Thickness: 373. Progression has improved. Findings include cystoid macular edema, disciform scar, subretinal fluid, subretinal hyper-reflective material, abnormal foveal contour.   Notes Subretinal scarring, disciform scar, slight increase subretinal fluid, today at 9 -week follow-up OS.  We will repeat injection today and may decrease interval of the evaluation to89 weeks  OD with irregular epiretinal membrane foveal distortion yet stable over time with no outer retinal changes.  We will continue to monitor and observe       Intravitreal Injection, Pharmacologic Agent - OS - Left Eye       Time Out 01/23/2021. 11:12 AM. Confirmed correct patient, procedure, site, and patient consented.   Anesthesia Topical anesthesia was used. Anesthetic medications included [place] 3.5%.    Procedure Preparation included Tobramycin 0.3%, 10% betadine to eyelids, 5% betadine to ocular surface. A supplied needle was used.   Injection:  2.5 mg Bevacizumab (AVASTIN) 2.65m/0.1mL SOSY   NDC: 709735-329-92  Route: Intravitreal, Site: Left Eye  Post-op Post injection exam found visual acuity of at least counting fingers. The patient tolerated the procedure well. There were no complications. The patient received written and verbal post procedure care education. Post injection medications were not given.                 ASSESSMENT/PLAN:  Exudative age-related macular degeneration of left eye with active choroidal neovascularization (HCC) The nature of wet macular degeneration was discussed with the patient.  Forms of therapy reviewed include the use of Anti-VEGF medications injected painlessly into the eye, as well as other possible treatment modalities, including thermal laser therapy. Fellow eye involvement and risks were discussed with the patient. Upon the finding of wet age related macular degeneration, treatment will be offered. The treatment regimen is on a treat as needed basis with the intent to treat if necessary and extend interval of exams when possible. On average 1 out of 6 patients do not need lifetime therapy. However, the risk of recurrent disease is high for a lifetime.  Initially monthly, then periodic, examinations and evaluations will determine whether the next treatment is required on the day of the examination.      ICD-10-CM   1. Exudative age-related macular degeneration of left eye with active choroidal neovascularization (HCC)  H35.3221 OCT, Retina - OU - Both Eyes  Intravitreal Injection, Pharmacologic Agent - OS - Left Eye    bevacizumab (AVASTIN) SOSY 2.5 mg    1.  Epiretinal membrane right eye, no change over time with good acuity  2.  OS, subfoveal CNVM, counseled scarring accounts for acuity  3.  Ophthalmic Meds Ordered this visit:   Meds ordered this encounter  Medications  . bevacizumab (AVASTIN) SOSY 2.5 mg       Return in about 8 weeks (around 03/20/2021) for dilate, OS, AVASTIN OCT.  There are no Patient Instructions on file for this visit.   Explained the diagnoses, plan, and follow up with the patient and they expressed understanding.  Patient expressed understanding of the importance of proper follow up care.   Clent Demark Terence Googe M.D. Diseases & Surgery of the Retina and Vitreous [facility] [place] 01/23/21     Abbreviations: M myopia (nearsighted); A astigmatism; H hyperopia (farsighted); P presbyopia; Mrx spectacle prescription;  CTL contact lenses; OD right eye; OS left eye; OU both eyes  XT exotropia; ET esotropia; PEK punctate epithelial keratitis; PEE punctate epithelial erosions; DES dry eye syndrome; MGD meibomian gland dysfunction; ATs artificial tears; PFAT's preservative free artificial tears; [place] nuclear sclerotic cataract; PSC posterior subcapsular cataract; ERM epi-retinal membrane; PVD posterior vitreous detachment; RD retinal detachment; DM diabetes mellitus; DR diabetic retinopathy; NPDR non-proliferative diabetic retinopathy; PDR proliferative diabetic retinopathy; CSME clinically significant macular edema; DME diabetic macular edema; dbh dot blot hemorrhages; CWS cotton wool spot; POAG primary open angle glaucoma; C/D cup-to-disc ratio; HVF humphrey visual field; GVF goldmann visual field; OCT optical coherence tomography; IOP intraocular pressure; BRVO Branch retinal vein occlusion; CRVO central retinal vein occlusion; CRAO central retinal artery occlusion; BRAO branch retinal artery occlusion; RT retinal tear; SB scleral buckle; PPV pars plana vitrectomy; VH Vitreous hemorrhage; PRP panretinal laser photocoagulation; IVK intravitreal kenalog; VMT vitreomacular traction; MH Macular hole;  NVD neovascularization of the disc; NVE neovascularization elsewhere; AREDS age related eye disease  study; ARMD age related macular degeneration; POAG primary open angle glaucoma; EBMD epithelial/anterior basement membrane dystrophy; ACIOL anterior chamber intraocular lens; IOL intraocular lens; PCIOL posterior chamber intraocular lens; Phaco/IOL phacoemulsification with intraocular lens placement; [place] photorefractive keratectomy; LASIK laser assisted in situ keratomileusis; HTN hypertension; DM diabetes mellitus; COPD chronic obstructive pulmonary disease

## 2021-02-22 ENCOUNTER — Other Ambulatory Visit: Payer: Self-pay | Admitting: Family Medicine

## 2021-02-22 DIAGNOSIS — E785 Hyperlipidemia, unspecified: Secondary | ICD-10-CM

## 2021-02-22 NOTE — Patient Instructions (Addendum)
It was wonderful to see you today.  Please bring ALL of your medications with you to every visit.   Today we talked about:  Hand pain  I will call you to discuss labs. I have prescribed naproxen for swelling and pain relief.   Please call the clinic at [phone] if your symptoms worsen or you have any concerns. It was our pleasure to serve you.  Dr. Janus Molder

## 2021-02-22 NOTE — Progress Notes (Signed)
    SUBJECTIVE:   CHIEF COMPLAINT / HPI:   Ms. Furukawa is a 75 yo F who presents for the following  Hand pain Left hand pain for 1 month, described as soreness. Located over her first knuckle. 1-2 weeks of swelling in same area. Denies trauma, fever, chills, or other joints with this same problem. Denies hx of gout but states she has rheumatoid arthritis and regular arthritis in her back. Has improved over time. Not taking any medication for it. More painful to touch and movement.   PERTINENT  PMH / PSH: HTN (took blood pressure medication this morning avg of 130s SBP at home), DM, HLD, s/p left & right shoulder replacement   OBJECTIVE:   BP (!) 161/59   Pulse 69   Wt 231 lb 12.8 oz (105.1 kg)   SpO2 97%   BMI 36.31 kg/m   General: Appears well, no acute distress. Age appropriate. Cardiac: RRR, normal heart sounds, no murmurs Extremities: Left MCP is edematous. No significant erythema or asymmetric warmth. Tender to palpation. Appreciable ulnar deviation of both hands. Of note, casted shadow makes finger appear to be darker than they are in person. No discoloration present.       ASSESSMENT/PLAN:   Swelling of hand joint, left x1 month with some improvement over time. No hx of gout/pseudogout and lower suspicion for infection given lack of local and systemic symptoms. Suspect RA. Patient does not have a formal diagnosis and has never seen an endocrinologist. Will initiate beginning work up for RA.  - Rheumatoid factor - CBC with Differential - C-reactive protein - CYCLIC CITRUL PEPTIDE ANTIBODY, IGG/IGA - naproxen (NAPROSYN) 500 MG tablet; Take 1 tablet (500 mg total) by mouth 2 (two) times daily with a meal for 10 days.  Dispense: 20 tablet; Refill: 0 - Consider need for endocrinologist referral     Gerlene Fee, [place]

## 2021-02-23 ENCOUNTER — Other Ambulatory Visit: Payer: Self-pay

## 2021-02-23 ENCOUNTER — Ambulatory Visit (INDEPENDENT_AMBULATORY_CARE_PROVIDER_SITE_OTHER): Payer: Medicare Other | Admitting: Family Medicine

## 2021-02-23 VITALS — BP 161/59 | HR 69 | Wt 231.8 lb

## 2021-02-23 DIAGNOSIS — M25442 Effusion, left hand: Secondary | ICD-10-CM | POA: Diagnosis not present

## 2021-02-23 MED ORDER — NAPROXEN 500 MG PO TABS: 500.0000 mg | ORAL_TABLET | Freq: Two times a day (BID) | ORAL | 0 refills | Status: AC

## 2021-02-24 DIAGNOSIS — M25442 Effusion, left hand: Secondary | ICD-10-CM | POA: Insufficient documentation

## 2021-02-24 HISTORY — DX: Effusion, left hand: M25.442

## 2021-02-24 NOTE — Assessment & Plan Note (Signed)
X1 month with some improvement over time. No hx of gout/pseudogout and lower suspicion for infection given lack of local and systemic symptoms. Suspect RA. Patient does not have a formal diagnosis and has never seen an endocrinologist. Will initiate beginning work up for RA.  - Rheumatoid factor - CBC with Differential - C-reactive protein - CYCLIC CITRUL PEPTIDE ANTIBODY, IGG/IGA - naproxen (NAPROSYN) 500 MG tablet; Take 1 tablet (500 mg total) by mouth 2 (two) times daily with a meal for 10 days.  Dispense: 20 tablet; Refill: 0 - Consider need for endocrinologist referral

## 2021-02-28 LAB — CBC WITH DIFFERENTIAL/PLATELET
Basophils Absolute: 0 10*3/uL (ref 0.0–0.2)
Basos: 0 %
EOS (ABSOLUTE): 0.1 10*3/uL (ref 0.0–0.4)
Eos: 1 %
Hematocrit: 41.5 % (ref 34.0–46.6)
Hemoglobin: 13.3 g/dL (ref 11.1–15.9)
Immature Grans (Abs): 0 10*3/uL (ref 0.0–0.1)
Immature Granulocytes: 0 %
Lymphocytes Absolute: 2.3 10*3/uL (ref 0.7–3.1)
Lymphs: 25 %
MCH: 26.3 pg — ABNORMAL LOW (ref 26.6–33.0)
MCHC: 32 g/dL (ref 31.5–35.7)
MCV: 82 fL (ref 79–97)
Monocytes Absolute: 0.7 10*3/uL (ref 0.1–0.9)
Monocytes: 8 %
Neutrophils Absolute: 6 10*3/uL (ref 1.4–7.0)
Neutrophils: 66 %
Platelets: 275 10*3/uL (ref 150–450)
RBC: 5.05 x10E6/uL (ref 3.77–5.28)
RDW: 15.4 % (ref 11.7–15.4)
WBC: 9.1 10*3/uL (ref 3.4–10.8)

## 2021-02-28 LAB — CYCLIC CITRUL PEPTIDE ANTIBODY, IGG/IGA: Cyclic Citrullin Peptide Ab: 7 units (ref 0–19)

## 2021-02-28 LAB — C-REACTIVE PROTEIN: CRP: 3 mg/L (ref 0–10)

## 2021-02-28 LAB — RHEUMATOID FACTOR: Rheumatoid fact SerPl-aCnc: <10 IU/mL (ref <14.0)

## 2021-03-02 ENCOUNTER — Other Ambulatory Visit: Payer: Self-pay | Admitting: Family Medicine

## 2021-03-02 DIAGNOSIS — M25442 Effusion, left hand: Secondary | ICD-10-CM

## 2021-03-20 ENCOUNTER — Encounter (INDEPENDENT_AMBULATORY_CARE_PROVIDER_SITE_OTHER): Payer: Medicare Other | Admitting: Ophthalmology

## 2021-03-20 ENCOUNTER — Ambulatory Visit (INDEPENDENT_AMBULATORY_CARE_PROVIDER_SITE_OTHER): Payer: Medicare Other | Admitting: Ophthalmology

## 2021-03-20 ENCOUNTER — Other Ambulatory Visit: Payer: Self-pay

## 2021-03-20 ENCOUNTER — Encounter (INDEPENDENT_AMBULATORY_CARE_PROVIDER_SITE_OTHER): Payer: Self-pay | Admitting: Ophthalmology

## 2021-03-20 DIAGNOSIS — E11319 Type 2 diabetes mellitus with unspecified diabetic retinopathy without macular edema: Secondary | ICD-10-CM | POA: Diagnosis not present

## 2021-03-20 DIAGNOSIS — Z794 Long term (current) use of insulin: Secondary | ICD-10-CM

## 2021-03-20 DIAGNOSIS — H35371 Puckering of macula, right eye: Secondary | ICD-10-CM

## 2021-03-20 DIAGNOSIS — H353112 Nonexudative age-related macular degeneration, right eye, intermediate dry stage: Secondary | ICD-10-CM | POA: Diagnosis not present

## 2021-03-20 DIAGNOSIS — H353221 Exudative age-related macular degeneration, left eye, with active choroidal neovascularization: Secondary | ICD-10-CM

## 2021-03-20 MED ORDER — BEVACIZUMAB 2.5 MG/0.1ML IZ SOSY
2.5000 mg | PREFILLED_SYRINGE | INTRAVITREAL | Status: AC | PRN
  Administered 2021-03-20: 2.5 mg via INTRAVITREAL

## 2021-03-20 NOTE — Assessment & Plan Note (Signed)
No signs of CNVM by OCT OD

## 2021-03-20 NOTE — Assessment & Plan Note (Signed)
No change by OCT dilate OU next

## 2021-03-20 NOTE — Assessment & Plan Note (Signed)
CNVM now turned into disciform scar with therapy, subfoveal location accounts for acuity

## 2021-03-20 NOTE — Progress Notes (Signed)
03/20/2021     CHIEF COMPLAINT Patient presents for Retina Follow Up (8 week fu OS and Avastin OS/Pt states VA OU stable since last visit. Pt denies FOL, floaters, or ocular pain OU. [name]: 6.5/LBS:143/)   HISTORY OF PRESENT ILLNESS: Kaitlyn Ferguson is a 75 y.o. female who presents to the clinic today for:   HPI     Retina Follow Up           Diagnosis: Wet AMD   Laterality: left eye   Onset: 8 weeks ago   Severity: mild   Duration: 8 weeks   Course: stable   Comments: 8 week fu OS and Avastin OS Pt states VA OU stable since last visit. Pt denies FOL, floaters, or ocular pain OU.  A1C: 6.5 LBS:143        Last edited by Kendra Opitz, COA on 03/20/2021  9:13 AM.      Referring physician: [name], MD [place],  [place] [id number]  HISTORICAL INFORMATION:   Selected notes from the MEDICAL RECORD NUMBER    Lab Results  Component Value Date   HGBA1C 6.5 01/11/2021     CURRENT MEDICATIONS: No current outpatient medications on file. (Ophthalmic Drugs)   No current facility-administered medications for this visit. (Ophthalmic Drugs)   Current Outpatient Medications (Other)  Medication Sig   Blood Glucose Monitoring Suppl (ONETOUCH VERIO FLEX SYSTEM) w/Device KIT 1 application by Does not apply route 2 (two) times daily.   Blood Glucose Monitoring Suppl (ONETOUCH VERIO) w/Device KIT Check blood sugars 2 times daily. Code: E11.9   diclofenac Sodium (VOLTAREN) 1 % GEL Apply 2 g topically 4 (four) times daily. (Patient not taking: No sig reported)   glucose blood (ONETOUCH VERIO) test strip USE TO TEST BLOOD SUGAR TWICE DAILY OR AS DIRECTED   insulin degludec (TRESIBA FLEXTOUCH) 100 UNIT/ML FlexTouch Pen Inject 62 Units into the skin daily.   Insulin Pen Needle (B-D UF III MINI PEN NEEDLES) 31G X 5 MM MISC USE WITH INSULIN TWICE DAILY   Lancet Devices (ONE TOUCH DELICA LANCING DEV) MISC Check blood sugars 2 times daily. Code: E11.9    Lancets (FREESTYLE) lancets 1 each by Other route 2 (two) times daily. Use as instructed   Lancets (ONETOUCH DELICA PLUS [id number]) MISC USE TO TEST BLOOD SUGAR TWICE DAILY   levothyroxine (SYNTHROID) 88 MCG tablet TAKE 1 TABLET BY MOUTH EVERY DAY BEFORE BREAKFAST   lisinopril-hydrochlorothiazide (ZESTORETIC) 20-12.5 MG tablet TAKE 1 TABLET BY MOUTH DAILY   metFORMIN (GLUCOPHAGE-XR) 500 MG 24 hr tablet Take 1 tablet (500 mg total) by mouth in the morning and at bedtime.   Multiple Vitamin (MULTIVITAMIN WITH MINERALS) TABS tablet Take 1 tablet by mouth daily. With Vitamin D3   rosuvastatin (CRESTOR) 5 MG tablet Take 5 mg by mouth daily.   Semaglutide, 1 MG/DOSE, 2 MG/1.5ML SOPN Inject 1 mg into the skin once a week.   No current facility-administered medications for this visit. (Other)      REVIEW OF SYSTEMS:    ALLERGIES Allergies  Allergen Reactions   Gabapentin Nausea Only   Vancomycin Itching    Pre-medicated with benadryl and tylenol 1 hr before administration of vanc    PAST MEDICAL HISTORY Past Medical History:  Diagnosis Date   Anatomical narrow angle glaucoma with borderline intraocular pressure, right 02/01/2020   Arthritis    Diabetes mellitus    DJD (degenerative joint disease) 10/07/2008   DJD (degenerative joint disease)  Family history of adverse reaction to anesthesia    MOTHER WAS DIFFICULT TO WAKE UP   Hyperlipidemia    Hypertension    Hypothyroidism    Nausea    INTERMITANT - TAKES PRILOSEC (DENIES REFLUX)   Obesity    Osteomyelitis of ankle (HCC)    Complications of Right ankle injury   Past Surgical History:  Procedure Laterality Date   ABDOMINAL HYSTERECTOMY     ANKLE FUSION  02/1996   Right ankle tib/talar   BACK SURGERY  1984   BREAST EXCISIONAL BIOPSY Right    CARPAL TUNNEL RELEASE  1993   Right arm   CHOLECYSTECTOMY     EYE SURGERY Left    NECK MASS EXCISION  12/29/2001   right eye laser     TONSILLECTOMY     Childhood   TOTAL  KNEE ARTHROPLASTY Left 12/21/2015   Procedure: LEFT TOTAL KNEE ARTHROPLASTY WITH COMPLEX COMPUTER NAVIGATION ;  Surgeon: Rod Can, MD;  Location: [facility];  Service: Orthopedics;  Laterality: Left;   TOTAL SHOULDER ARTHROPLASTY Right 07/11/2016   Procedure: RIGHT TOTAL SHOULDER ARTHROPLASTY;  Surgeon: Justice Britain, MD;  Location: [facility];  Service: Orthopedics;  Laterality: Right;   TOTAL SHOULDER ARTHROPLASTY Left 11/04/2019   Procedure: TOTAL SHOULDER ARTHROPLASTY;  Surgeon: Justice Britain, MD;  Location: [facility];  Service: Orthopedics;  Laterality: Left;  119mn    FAMILY HISTORY Family History  Problem Relation Age of Onset   Diabetes Brother    Diabetes Sister        DM2-lost weight-now okay   Heart attack Father        First MI- early 570's  Stroke Mother    Heart failure Mother    Multiple myeloma Mother        Deceased at 753due to same   Breast cancer Maternal Aunt    Heart attack Son     SOCIAL HISTORY Social History   Tobacco Use   Smoking status: Never   Smokeless tobacco: Never   Tobacco comments:    No tobacco currently -   Vaping Use   Vaping Use: Never used  Substance Use Topics   Alcohol use: No    Alcohol/week: 0.0 standard drinks   Drug use: No         OPHTHALMIC EXAM:  Base Eye Exam     Visual Acuity (ETDRS)       Right Left   Dist [place] 20/20 -1 CF at 3'         Tonometry (Tonopen, 9:16 AM)       Right Left   Pressure 18 15         Pupils       Pupils Dark Light Shape React APD   Right PERRL 4 3 Round Slow None   Left PERRL 4 3 Round Slow None         Visual Fields (Counting fingers)       Left Right    Full Full         Extraocular Movement       Right Left    Full Full         Neuro/Psych     Oriented x3: Yes   Mood/Affect: Normal         Dilation     Left eye: 1.0% Mydriacyl, 2.5% Phenylephrine @ 9:16 AM           Slit Lamp and Fundus Exam     External  Exam       Right Left   External  Normal Normal         Slit Lamp Exam       Right Left   Lids/Lashes Normal Normal   Conjunctiva/Sclera White and quiet White and quiet   Cornea Clear Clear   Anterior Chamber Deep and quiet Deep and quiet   Iris Round and reactive Round and reactive   Lens Posterior chamber intraocular lens, Centered posterior chamber intraocular lens Posterior chamber intraocular lens, Centered posterior chamber intraocular lens   Anterior Vitreous Normal Normal         Fundus Exam       Right Left   Posterior Vitreous  Posterior vitreous detachment, Central vitreous floaters   Disc  Normal   C/D Ratio  0.35   Macula  , Retinal pigment epithelial mottling, Disciform scar, residual SR heme temporal to the fovea   Vessels  no DR   Periphery  Normal            IMAGING AND PROCEDURES  Imaging and Procedures for 03/20/21  OCT, Retina - OU - Both Eyes       Right Eye Quality was borderline. Scan locations included subfoveal. Central Foveal Thickness: 288. Progression has been stable. Findings include epiretinal membrane, abnormal foveal contour.   Left Eye Quality was good. Scan locations included subfoveal. Central Foveal Thickness: 353. Progression has improved. Findings include cystoid macular edema, disciform scar, subretinal fluid, subretinal hyper-reflective material, abnormal foveal contour.   Notes Subretinal scarring, disciform scar, slight increase subretinal fluid, today at 9 -week follow-up OS.  We will repeat injection today and may maintain interval of the evaluation to 8 weeks  OD with irregular epiretinal membrane foveal distortion yet stable over time with no outer retinal changes.  We will continue to monitor and observe     Intravitreal Injection, Pharmacologic Agent - OS - Left Eye       Time Out 03/20/2021. 9:40 AM. Confirmed correct patient, procedure, site, and patient consented.   Anesthesia Topical anesthesia was used. Anesthetic medications included  [place] 3.5%.   Procedure Preparation included Tobramycin 0.3%, 10% betadine to eyelids, 5% betadine to ocular surface. A supplied needle was used.   Injection: 2.5 mg bevacizumab 2.5 MG/0.1ML   Route: Intravitreal, Site: Left Eye   NDC: [phone], Lot: [id number]   Post-op Post injection exam found visual acuity of at least counting fingers. The patient tolerated the procedure well. There were no complications. The patient received written and verbal post procedure care education. Post injection medications were not given.              ASSESSMENT/PLAN:  Right epiretinal membrane No change by OCT dilate OU next  Type 2 diabetes mellitus with ophthalmic complication (HCC) No detectable diabetic retinopathy OS  Intermediate stage nonexudative age-related macular degeneration of right eye No signs of CNVM by OCT OD  Exudative age-related macular degeneration of left eye with active choroidal neovascularization ([place]) CNVM now turned into disciform scar with therapy, subfoveal location accounts for acuity     ICD-10-CM   1. Exudative age-related macular degeneration of left eye with active choroidal neovascularization (HCC)  H35.3221 OCT, Retina - OU - Both Eyes    Intravitreal Injection, Pharmacologic Agent - OS - Left Eye    bevacizumab (AVASTIN) SOSY 2.5 mg    2. Right epiretinal membrane  H35.371     3. Type 2 diabetes mellitus with retinopathy, with long-term current use of  insulin, macular edema presence unspecified, unspecified laterality, unspecified retinopathy severity ([place])  E11.319    Z79.4     4. Intermediate stage nonexudative age-related macular degeneration of right eye  H35.3112       1.  Vastly improved macular findings, by exam and by OCT OS yet with foveal destruction by CNVM subfoveal.  No signs of enlargement.  Repeat injection today and maintain 8 to 9-week evaluation  2.  Dilate OU next  3.  Ophthalmic Meds Ordered this visit:  Meds ordered this  encounter  Medications   bevacizumab (AVASTIN) SOSY 2.5 mg       Return in about 8 weeks (around 05/15/2021) for DILATE OU, AVASTIN OCT, OS.  There are no Patient Instructions on file for this visit.   Explained the diagnoses, plan, and follow up with the patient and they expressed understanding.  Patient expressed understanding of the importance of proper follow up care.   Clent Demark Katye Valek M.D. Diseases & Surgery of the Retina and Vitreous [facility] [place] 03/20/21     Abbreviations: M myopia (nearsighted); A astigmatism; H hyperopia (farsighted); P presbyopia; Mrx spectacle prescription;  CTL contact lenses; OD right eye; OS left eye; OU both eyes  XT exotropia; ET esotropia; PEK punctate epithelial keratitis; PEE punctate epithelial erosions; DES dry eye syndrome; MGD meibomian gland dysfunction; ATs artificial tears; PFAT's preservative free artificial tears; [place] nuclear sclerotic cataract; PSC posterior subcapsular cataract; ERM epi-retinal membrane; PVD posterior vitreous detachment; RD retinal detachment; DM diabetes mellitus; DR diabetic retinopathy; NPDR non-proliferative diabetic retinopathy; PDR proliferative diabetic retinopathy; CSME clinically significant macular edema; DME diabetic macular edema; dbh dot blot hemorrhages; CWS cotton wool spot; POAG primary open angle glaucoma; C/D cup-to-disc ratio; HVF humphrey visual field; GVF goldmann visual field; OCT optical coherence tomography; IOP intraocular pressure; BRVO Branch retinal vein occlusion; CRVO central retinal vein occlusion; CRAO central retinal artery occlusion; BRAO branch retinal artery occlusion; RT retinal tear; SB scleral buckle; PPV pars plana vitrectomy; VH Vitreous hemorrhage; PRP panretinal laser photocoagulation; IVK intravitreal kenalog; VMT vitreomacular traction; MH Macular hole;  NVD neovascularization of the disc; NVE neovascularization elsewhere; AREDS age related eye disease study; ARMD age  related macular degeneration; POAG primary open angle glaucoma; EBMD epithelial/anterior basement membrane dystrophy; ACIOL anterior chamber intraocular lens; IOL intraocular lens; PCIOL posterior chamber intraocular lens; Phaco/IOL phacoemulsification with intraocular lens placement; [place] photorefractive keratectomy; LASIK laser assisted in situ keratomileusis; HTN hypertension; DM diabetes mellitus; COPD chronic obstructive pulmonary disease

## 2021-03-20 NOTE — Assessment & Plan Note (Signed)
No detectable diabetic retinopathy OS

## 2021-03-27 ENCOUNTER — Other Ambulatory Visit: Payer: Self-pay | Admitting: Family Medicine

## 2021-03-27 DIAGNOSIS — E119 Type 2 diabetes mellitus without complications: Secondary | ICD-10-CM

## 2021-04-06 ENCOUNTER — Telehealth: Payer: Self-pay

## 2021-04-06 NOTE — Telephone Encounter (Signed)
Pt contacted to schedule AWV. Pt stated she was interested in completing on 04/18/2021 if there is availability to do so. Informed pt office would contact her with more information.

## 2021-04-18 ENCOUNTER — Ambulatory Visit: Payer: Medicare Other | Admitting: Family Medicine

## 2021-04-25 ENCOUNTER — Other Ambulatory Visit: Payer: Self-pay

## 2021-04-25 ENCOUNTER — Encounter: Payer: Self-pay | Admitting: Family Medicine

## 2021-04-25 ENCOUNTER — Ambulatory Visit (INDEPENDENT_AMBULATORY_CARE_PROVIDER_SITE_OTHER): Payer: Medicare Other | Admitting: Family Medicine

## 2021-04-25 VITALS — BP 158/72 | HR 76 | Ht 67.0 in | Wt 236.8 lb

## 2021-04-25 DIAGNOSIS — Z794 Long term (current) use of insulin: Secondary | ICD-10-CM | POA: Diagnosis not present

## 2021-04-25 DIAGNOSIS — I10 Essential (primary) hypertension: Secondary | ICD-10-CM | POA: Diagnosis not present

## 2021-04-25 DIAGNOSIS — E11319 Type 2 diabetes mellitus with unspecified diabetic retinopathy without macular edema: Secondary | ICD-10-CM | POA: Diagnosis not present

## 2021-04-25 DIAGNOSIS — M25442 Effusion, left hand: Secondary | ICD-10-CM | POA: Diagnosis not present

## 2021-04-25 DIAGNOSIS — E038 Other specified hypothyroidism: Secondary | ICD-10-CM | POA: Diagnosis not present

## 2021-04-25 LAB — POCT GLYCOSYLATED HEMOGLOBIN (HGB A1C): HbA1c, POC (controlled diabetic range): 7 % (ref 0.0–7.0)

## 2021-04-25 NOTE — Patient Instructions (Addendum)
Please check your blood pressure using the machine (not fit bit) 5 or 6 times.  Write down the numbers so we can decide if I need to increase your medication.  Ideally, the blood pressure numbers should be lower than 140/90 For the diabetes, quit buying and eating the junk food.   I will call with blood test results.  I am doing the pretty good test for gout.  I am also checking your kidneys and thyroid.

## 2021-04-26 LAB — RENAL FUNCTION PANEL
Albumin: 4.4 g/dL (ref 3.7–4.7)
BUN/Creatinine Ratio: 22 (ref 12–28)
BUN: 20 mg/dL (ref 8–27)
CO2: 25 mmol/L (ref 20–29)
Calcium: 9.7 mg/dL (ref 8.7–10.3)
Chloride: 100 mmol/L (ref 96–106)
Creatinine, Ser: 0.89 mg/dL (ref 0.57–1.00)
Glucose: 122 mg/dL — ABNORMAL HIGH (ref 65–99)
Phosphorus: 3.7 mg/dL (ref 3.0–4.3)
Potassium: 4.1 mmol/L (ref 3.5–5.2)
Sodium: 142 mmol/L (ref 134–144)
eGFR: 68 mL/min/{1.73_m2} (ref >59)

## 2021-04-26 LAB — URIC ACID: Uric Acid: 6.4 mg/dL (ref 3.1–7.9)

## 2021-04-26 LAB — TSH: TSH: 1.87 u[IU]/mL (ref 0.450–4.500)

## 2021-04-27 ENCOUNTER — Encounter: Payer: Self-pay | Admitting: Family Medicine

## 2021-04-27 NOTE — Assessment & Plan Note (Signed)
Uncertain control.  She will check and record home BPs and let me know.

## 2021-04-27 NOTE — Assessment & Plan Note (Signed)
Checked uric acid.  Low level makes gout unlikely.

## 2021-04-27 NOTE — Assessment & Plan Note (Signed)
Rechecked TSH (normal).  Continue current Rx.

## 2021-04-27 NOTE — Progress Notes (Signed)
Diabe   SUBJECTIVE:   CHIEF COMPLAINT / HPI:   Diabetes and more. [id number] good.  Admits that diet has not been as good and we both note wt is up 5 LBs. States she has been eating "junk."  Tells me she will stop buying junk.  A1C still at goal at 7.0  Hypothyroid on replacement.  No recent [place].  No wt or appetitie change.  No heat or cold intollerence.  HBp: States BPs at home are good.  Noted to be up here on the past 2 occasions.    Seen recently for inflammation of 2nd MCP joint.  Calming down but still mildly painful.  Wondered about gout.  Never had uric acid tested.  On HCTZ    OBJECTIVE:   BP (!) 158/72   Pulse 76   Ht '5\' 7"'$  (1.702 m)   Wt 236 lb 12.8 oz (107.4 kg)   SpO2 94%   BMI 37.09 kg/m   Lungs clear Cardiac RRR without m or g Ext no edema.  MCP joint swollen with mild tenderness.  ASSESSMENT/PLAN:   Hypothyroidism Rechecked TSH (normal).  Continue current Rx.  Primary hypertension Uncertain control.  She will check and record home BPs and let me know.    Type 2 diabetes mellitus with ophthalmic complication (HCC) Still at goal.  Focus on improving diet.  Swelling of hand joint, left Checked uric acid.  Low level makes gout unlikely.     [name], MD [place]

## 2021-04-27 NOTE — Assessment & Plan Note (Signed)
Still at goal.  Focus on improving diet.

## 2021-05-03 ENCOUNTER — Other Ambulatory Visit: Payer: Self-pay | Admitting: Family Medicine

## 2021-05-03 DIAGNOSIS — Z1231 Encounter for screening mammogram for malignant neoplasm of breast: Secondary | ICD-10-CM

## 2021-05-07 ENCOUNTER — Other Ambulatory Visit: Payer: Self-pay | Admitting: Family Medicine

## 2021-05-07 DIAGNOSIS — E038 Other specified hypothyroidism: Secondary | ICD-10-CM

## 2021-05-11 DIAGNOSIS — Z9883 Filtering (vitreous) bleb after glaucoma surgery status: Secondary | ICD-10-CM | POA: Diagnosis not present

## 2021-05-11 DIAGNOSIS — H353111 Nonexudative age-related macular degeneration, right eye, early dry stage: Secondary | ICD-10-CM | POA: Diagnosis not present

## 2021-05-11 DIAGNOSIS — E119 Type 2 diabetes mellitus without complications: Secondary | ICD-10-CM | POA: Diagnosis not present

## 2021-05-11 DIAGNOSIS — Z794 Long term (current) use of insulin: Secondary | ICD-10-CM | POA: Diagnosis not present

## 2021-05-11 DIAGNOSIS — Z961 Presence of intraocular lens: Secondary | ICD-10-CM | POA: Diagnosis not present

## 2021-05-11 DIAGNOSIS — H353221 Exudative age-related macular degeneration, left eye, with active choroidal neovascularization: Secondary | ICD-10-CM | POA: Diagnosis not present

## 2021-05-11 DIAGNOSIS — H40021 Open angle with borderline findings, high risk, right eye: Secondary | ICD-10-CM | POA: Diagnosis not present

## 2021-05-11 DIAGNOSIS — H401122 Primary open-angle glaucoma, left eye, moderate stage: Secondary | ICD-10-CM | POA: Diagnosis not present

## 2021-05-15 ENCOUNTER — Other Ambulatory Visit: Payer: Self-pay

## 2021-05-15 ENCOUNTER — Ambulatory Visit (INDEPENDENT_AMBULATORY_CARE_PROVIDER_SITE_OTHER): Payer: Medicare Other | Admitting: Ophthalmology

## 2021-05-15 ENCOUNTER — Encounter (INDEPENDENT_AMBULATORY_CARE_PROVIDER_SITE_OTHER): Payer: Self-pay | Admitting: Ophthalmology

## 2021-05-15 ENCOUNTER — Encounter (INDEPENDENT_AMBULATORY_CARE_PROVIDER_SITE_OTHER): Payer: Medicare Other | Admitting: Ophthalmology

## 2021-05-15 DIAGNOSIS — H353221 Exudative age-related macular degeneration, left eye, with active choroidal neovascularization: Secondary | ICD-10-CM

## 2021-05-15 DIAGNOSIS — H35371 Puckering of macula, right eye: Secondary | ICD-10-CM | POA: Diagnosis not present

## 2021-05-15 DIAGNOSIS — H353112 Nonexudative age-related macular degeneration, right eye, intermediate dry stage: Secondary | ICD-10-CM | POA: Diagnosis not present

## 2021-05-15 MED ORDER — BEVACIZUMAB 2.5 MG/0.1ML IZ SOSY
2.5000 mg | PREFILLED_SYRINGE | INTRAVITREAL | Status: AC | PRN
  Administered 2021-05-15: 2.5 mg via INTRAVITREAL

## 2021-05-15 NOTE — Assessment & Plan Note (Signed)
No signs of CNVM

## 2021-05-15 NOTE — Assessment & Plan Note (Signed)
Severe topographic distortion right eye yet this is been stable now for many years with no impact on acuity will continue to observe

## 2021-05-15 NOTE — Assessment & Plan Note (Signed)
No detectable diabetic retinopathy 

## 2021-05-15 NOTE — Assessment & Plan Note (Signed)
Subfoveal disciform scar with overlying adjacent intraretinal fluid and CME.  Improved at 8-week interval today.  Repeat injection today to maintain and prevent scotoma enlargement OS

## 2021-05-15 NOTE — Progress Notes (Signed)
05/15/2021     CHIEF COMPLAINT Patient presents for  Chief Complaint  Patient presents with   Retina Follow Up      HISTORY OF PRESENT ILLNESS: Kaitlyn Ferguson is a 75 y.o. female who presents to the clinic today for:   HPI     Retina Follow Up   Patient presents with  Wet AMD.  In left eye.  This started 8 weeks ago.  Severity is mild.  Duration of 8 weeks.  Since onset it is stable.        Comments   8 week fu OU and oct and Avastin OS Pt states VA OU stable since last visit. Pt denies FOL, floaters, or ocular pain OU.  A1C: 7.0 LBS: 131       Last edited by Kendra Opitz, COA on 05/15/2021  2:35 PM.      Referring physician: [name], MD [place],  [place] [address]  HISTORICAL INFORMATION:   Selected notes from the MEDICAL RECORD NUMBER    Lab Results  Component Value Date   HGBA1C 7.0 04/25/2021     CURRENT MEDICATIONS: No current outpatient medications on file. (Ophthalmic Drugs)   No current facility-administered medications for this visit. (Ophthalmic Drugs)   Current Outpatient Medications (Other)  Medication Sig   Blood Glucose Monitoring Suppl (ONETOUCH VERIO FLEX SYSTEM) w/Device KIT 1 application by Does not apply route 2 (two) times daily.   Blood Glucose Monitoring Suppl (ONETOUCH VERIO) w/Device KIT Check blood sugars 2 times daily. Code: E11.9   diclofenac Sodium (VOLTAREN) 1 % GEL Apply 2 g topically 4 (four) times daily. (Patient not taking: No sig reported)   glucose blood (ONETOUCH VERIO) test strip USE TO TEST BLOOD SUGAR TWICE DAILY OR AS DIRECTED   Insulin Pen Needle (B-D UF III MINI PEN NEEDLES) 31G X 5 MM MISC USE WITH INSULIN TWICE DAILY   Lancet Devices (ONE TOUCH DELICA LANCING DEV) MISC Check blood sugars 2 times daily. Code: E11.9   Lancets (FREESTYLE) lancets 1 each by Other route 2 (two) times daily. Use as instructed   Lancets (ONETOUCH DELICA PLUS [id number]) MISC USE TO TEST BLOOD  SUGAR TWICE DAILY   levothyroxine (SYNTHROID) 88 MCG tablet TAKE 1 TABLET BY MOUTH EVERY DAY BEFORE BREAKFAST   lisinopril-hydrochlorothiazide (ZESTORETIC) 20-12.5 MG tablet TAKE 1 TABLET BY MOUTH DAILY   metFORMIN (GLUCOPHAGE-XR) 500 MG 24 hr tablet Take 1 tablet (500 mg total) by mouth in the morning and at bedtime.   Multiple Vitamin (MULTIVITAMIN WITH MINERALS) TABS tablet Take 1 tablet by mouth daily. With Vitamin D3   rosuvastatin (CRESTOR) 5 MG tablet Take 5 mg by mouth daily.   Semaglutide, 1 MG/DOSE, 2 MG/1.5ML SOPN Inject 1 mg into the skin once a week.   TRESIBA FLEXTOUCH 100 UNIT/ML FlexTouch Pen ADMINISTER 70 UNITS UNDER THE SKIN EVERY DAY AS DIRECTED   No current facility-administered medications for this visit. (Other)      REVIEW OF SYSTEMS:    ALLERGIES Allergies  Allergen Reactions   Gabapentin Nausea Only   Vancomycin Itching    Pre-medicated with benadryl and tylenol 1 hr before administration of vanc    PAST MEDICAL HISTORY Past Medical History:  Diagnosis Date   Anatomical narrow angle glaucoma with borderline intraocular pressure, right 02/01/2020   Arthritis    Diabetes mellitus    DJD (degenerative joint disease) 10/07/2008   DJD (degenerative joint disease)    Family history of  adverse reaction to anesthesia    MOTHER WAS DIFFICULT TO WAKE UP   Hyperlipidemia    Hypertension    Hypothyroidism    Nausea    INTERMITANT - TAKES PRILOSEC (DENIES REFLUX)   Obesity    Osteomyelitis of ankle (HCC)    Complications of Right ankle injury   Past Surgical History:  Procedure Laterality Date   ABDOMINAL HYSTERECTOMY     ANKLE FUSION  02/1996   Right ankle tib/talar   BACK SURGERY  1984   BREAST EXCISIONAL BIOPSY Right    CARPAL TUNNEL RELEASE  1993   Right arm   CHOLECYSTECTOMY     EYE SURGERY Left    NECK MASS EXCISION  12/29/2001   right eye laser     TONSILLECTOMY     Childhood   TOTAL KNEE ARTHROPLASTY Left 12/21/2015   Procedure: LEFT TOTAL  KNEE ARTHROPLASTY WITH COMPLEX COMPUTER NAVIGATION ;  Surgeon: Rod Can, MD;  Location: [facility];  Service: Orthopedics;  Laterality: Left;   TOTAL SHOULDER ARTHROPLASTY Right 07/11/2016   Procedure: RIGHT TOTAL SHOULDER ARTHROPLASTY;  Surgeon: Justice Britain, MD;  Location: [facility];  Service: Orthopedics;  Laterality: Right;   TOTAL SHOULDER ARTHROPLASTY Left 11/04/2019   Procedure: TOTAL SHOULDER ARTHROPLASTY;  Surgeon: Justice Britain, MD;  Location: [facility];  Service: Orthopedics;  Laterality: Left;  129mn    FAMILY HISTORY Family History  Problem Relation Age of Onset   Diabetes Brother    Diabetes Sister        DM2-lost weight-now okay   Heart attack Father        First MI- early 533's  Stroke Mother    Heart failure Mother    Multiple myeloma Mother        Deceased at 757due to same   Breast cancer Maternal Aunt    Heart attack Son     SOCIAL HISTORY Social History   Tobacco Use   Smoking status: Never   Smokeless tobacco: Never   Tobacco comments:    No tobacco currently -   Vaping Use   Vaping Use: Never used  Substance Use Topics   Alcohol use: No    Alcohol/week: 0.0 standard drinks   Drug use: No         OPHTHALMIC EXAM:  Base Eye Exam     Visual Acuity (ETDRS)       Right Left   Dist [place] 20/25 -1 CF at 3'         Tonometry (Tonopen, 2:38 PM)       Right Left   Pressure 13 11         Pupils       Pupils Dark Light Shape React APD   Right PERRL 4 3 Round Slow None   Left PERRL 4 3 Round Slow None         Visual Fields (Counting fingers)       Left Right    Full Full         Extraocular Movement       Right Left    Full Full         Neuro/Psych     Oriented x3: Yes   Mood/Affect: Normal         Dilation     Both eyes: 1.0% Mydriacyl, 2.5% Phenylephrine @ 2:38 PM           Slit Lamp and Fundus Exam     External Exam  Right Left   External Normal Normal         Slit Lamp Exam       Right Left    Lids/Lashes Normal Normal   Conjunctiva/Sclera White and quiet White and quiet   Cornea Clear Clear   Anterior Chamber Deep and quiet Deep and quiet   Iris Round and reactive Round and reactive   Lens Posterior chamber intraocular lens, Centered posterior chamber intraocular lens Posterior chamber intraocular lens, Centered posterior chamber intraocular lens   Anterior Vitreous Normal Normal         Fundus Exam       Right Left   Posterior Vitreous Posterior vitreous detachment Posterior vitreous detachment, Central vitreous floaters   Disc Normal Normal   C/D Ratio 0.4 0.35   Macula Epiretinal membrane , Retinal pigment epithelial mottling, Disciform scar, residual SR heme temporal to the fovea   Vessels no DR no DR   Periphery Normal Normal            IMAGING AND PROCEDURES  Imaging and Procedures for 05/15/21  OCT, Retina - OU - Both Eyes       Right Eye Quality was borderline. Scan locations included subfoveal. Central Foveal Thickness: 288. Progression has been stable. Findings include epiretinal membrane, abnormal foveal contour.   Left Eye Quality was good. Scan locations included subfoveal. Central Foveal Thickness: 316. Progression has improved. Findings include cystoid macular edema, disciform scar, subretinal fluid, subretinal hyper-reflective material, abnormal foveal contour.   Notes Subretinal scarring, disciform scar, slight increase subretinal fluid, today at 8 -week follow-up OS.  We will repeat injection today and may maintain interval of the evaluation to 8 weeks  OD with irregular epiretinal membrane foveal distortion yet stable over time with no outer retinal changes.  We will continue to monitor and observe     Intravitreal Injection, Pharmacologic Agent - OS - Left Eye       Time Out 05/15/2021. 3:08 PM. Confirmed correct patient, procedure, site, and patient consented.   Anesthesia Topical anesthesia was used. Anesthetic medications  included [place] 3.5%.   Procedure Preparation included Tobramycin 0.3%, 10% betadine to eyelids, 5% betadine to ocular surface. A 30 gauge needle was used.   Injection: 2.5 mg bevacizumab 2.5 MG/0.1ML   Route: Intravitreal, Site: Left Eye   NDC: [phone], Lot: [id number]   Post-op Post injection exam found visual acuity of at least counting fingers. The patient tolerated the procedure well. There were no complications. The patient received written and verbal post procedure care education. Post injection medications included ocuflox.              ASSESSMENT/PLAN:  Right epiretinal membrane Severe topographic distortion right eye yet this is been stable now for many years with no impact on acuity will continue to observe  Intermediate stage nonexudative age-related macular degeneration of right eye No signs of CNVM  Type 2 diabetes mellitus with ophthalmic complication (HCC) No detectable diabetic retinopathy     ICD-10-CM   1. Exudative age-related macular degeneration of left eye with active choroidal neovascularization (HCC)  H35.3221 OCT, Retina - OU - Both Eyes    Intravitreal Injection, Pharmacologic Agent - OS - Left Eye    bevacizumab (AVASTIN) SOSY 2.5 mg    2. Intermediate stage nonexudative age-related macular degeneration of right eye  H35.3112 OCT, Retina - OU - Both Eyes    3. Right epiretinal membrane  H35.371       1.  OD, mild ARMD,  no sign of CNVM  Epiretinal membrane OD stable with good acuity will observe  2.  OS with chronic active CNVM subfoveal location with intraretinal fluid overlying which does increase in size longer than 8-week interval follow-up.  Repeat injection Avastin today to maintain and prevent scotoma enlargement  3.  Ophthalmic Meds Ordered this visit:  Meds ordered this encounter  Medications   bevacizumab (AVASTIN) SOSY 2.5 mg       Return in about 8 weeks (around 07/10/2021) for dilate, OS, AVASTIN OCT.  There are no  Patient Instructions on file for this visit.   Explained the diagnoses, plan, and follow up with the patient and they expressed understanding.  Patient expressed understanding of the importance of proper follow up care.   Clent Demark Micco Bourbeau M.D. Diseases & Surgery of the Retina and Vitreous Retina & Diabetic [place] 05/15/21     Abbreviations: M myopia (nearsighted); A astigmatism; H hyperopia (farsighted); P presbyopia; Mrx spectacle prescription;  CTL contact lenses; OD right eye; OS left eye; OU both eyes  XT exotropia; ET esotropia; PEK punctate epithelial keratitis; PEE punctate epithelial erosions; DES dry eye syndrome; MGD meibomian gland dysfunction; ATs artificial tears; PFAT's preservative free artificial tears; [place] nuclear sclerotic cataract; PSC posterior subcapsular cataract; ERM epi-retinal membrane; PVD posterior vitreous detachment; RD retinal detachment; DM diabetes mellitus; DR diabetic retinopathy; NPDR non-proliferative diabetic retinopathy; PDR proliferative diabetic retinopathy; CSME clinically significant macular edema; DME diabetic macular edema; dbh dot blot hemorrhages; CWS cotton wool spot; POAG primary open angle glaucoma; C/D cup-to-disc ratio; HVF humphrey visual field; GVF goldmann visual field; OCT optical coherence tomography; IOP intraocular pressure; BRVO Branch retinal vein occlusion; CRVO central retinal vein occlusion; CRAO central retinal artery occlusion; BRAO branch retinal artery occlusion; RT retinal tear; SB scleral buckle; PPV pars plana vitrectomy; VH Vitreous hemorrhage; PRP panretinal laser photocoagulation; IVK intravitreal kenalog; VMT vitreomacular traction; MH Macular hole;  NVD neovascularization of the disc; NVE neovascularization elsewhere; AREDS age related eye disease study; ARMD age related macular degeneration; POAG primary open angle glaucoma; EBMD epithelial/anterior basement membrane dystrophy; ACIOL anterior chamber intraocular lens; IOL  intraocular lens; PCIOL posterior chamber intraocular lens; Phaco/IOL phacoemulsification with intraocular lens placement; [place] photorefractive keratectomy; LASIK laser assisted in situ keratomileusis; HTN hypertension; DM diabetes mellitus; COPD chronic obstructive pulmonary disease

## 2021-05-30 ENCOUNTER — Other Ambulatory Visit: Payer: Self-pay | Admitting: Family Medicine

## 2021-05-30 DIAGNOSIS — E785 Hyperlipidemia, unspecified: Secondary | ICD-10-CM

## 2021-05-30 MED ORDER — ROSUVASTATIN CALCIUM 5 MG PO TABS: 5.0000 mg | ORAL_TABLET | Freq: Every day | ORAL | 3 refills | Status: AC

## 2021-06-01 ENCOUNTER — Other Ambulatory Visit: Payer: Self-pay | Admitting: *Deleted

## 2021-06-01 DIAGNOSIS — E119 Type 2 diabetes mellitus without complications: Secondary | ICD-10-CM

## 2021-06-01 DIAGNOSIS — E11319 Type 2 diabetes mellitus with unspecified diabetic retinopathy without macular edema: Secondary | ICD-10-CM

## 2021-06-04 MED ORDER — METFORMIN HCL ER 500 MG PO TB24: 500.0000 mg | ORAL_TABLET | Freq: Two times a day (BID) | ORAL | 3 refills | Status: DC

## 2021-06-07 ENCOUNTER — Ambulatory Visit
Admission: RE | Admit: 2021-06-07 | Discharge: 2021-06-07 | Disposition: A | Discharge status: Home / Self Care | Payer: Medicare Other | Source: Ambulatory Visit | Attending: Family Medicine | Admitting: Family Medicine

## 2021-06-07 ENCOUNTER — Other Ambulatory Visit: Payer: Self-pay

## 2021-06-07 DIAGNOSIS — Z1231 Encounter for screening mammogram for malignant neoplasm of breast: Secondary | ICD-10-CM

## 2021-07-10 ENCOUNTER — Encounter (INDEPENDENT_AMBULATORY_CARE_PROVIDER_SITE_OTHER): Payer: Self-pay | Admitting: Ophthalmology

## 2021-07-10 ENCOUNTER — Other Ambulatory Visit: Payer: Self-pay

## 2021-07-10 ENCOUNTER — Ambulatory Visit (INDEPENDENT_AMBULATORY_CARE_PROVIDER_SITE_OTHER): Payer: Medicare Other | Admitting: Ophthalmology

## 2021-07-10 ENCOUNTER — Encounter (INDEPENDENT_AMBULATORY_CARE_PROVIDER_SITE_OTHER): Payer: Medicare Other | Admitting: Ophthalmology

## 2021-07-10 DIAGNOSIS — H353221 Exudative age-related macular degeneration, left eye, with active choroidal neovascularization: Secondary | ICD-10-CM

## 2021-07-10 DIAGNOSIS — H353112 Nonexudative age-related macular degeneration, right eye, intermediate dry stage: Secondary | ICD-10-CM

## 2021-07-10 DIAGNOSIS — H35371 Puckering of macula, right eye: Secondary | ICD-10-CM

## 2021-07-10 MED ORDER — BEVACIZUMAB 2.5 MG/0.1ML IZ SOSY
2.5000 mg | PREFILLED_SYRINGE | INTRAVITREAL | Status: AC | PRN
  Administered 2021-07-10: 2.5 mg via INTRAVITREAL

## 2021-07-10 NOTE — Assessment & Plan Note (Signed)
Very severe topographic distortion yet good acuity and no outer retinal changes.  We will thus observe

## 2021-07-10 NOTE — Assessment & Plan Note (Signed)
Exudative CNVM Subfoveal OS with chronic disease activity threatening subfoveal region as well as overlying retina, repeat injection today at 8 weeks and maintain 8 to 9-week interval next

## 2021-07-10 NOTE — Assessment & Plan Note (Signed)
No signs of CNVM OD 

## 2021-07-10 NOTE — Progress Notes (Signed)
07/10/2021     CHIEF COMPLAINT Patient presents for  Chief Complaint  Patient presents with   Retina Follow Up      HISTORY OF PRESENT ILLNESS: Kaitlyn Ferguson is a 75 y.o. female who presents to the clinic today for:   HPI     Retina Follow Up   Patient presents with  Wet AMD.  In left eye.  This started 8 weeks ago.  Duration of 8 weeks.  Since onset it is stable.        Comments   8 week f/u OS with OCT and possible Avastin injection      Last edited by Reather Littler, COA on 07/10/2021  2:09 PM.      Referring physician: [name], MD [place],  [place] [address]  HISTORICAL INFORMATION:   Selected notes from the MEDICAL RECORD NUMBER    Lab Results  Component Value Date   HGBA1C 7.0 04/25/2021     CURRENT MEDICATIONS: No current outpatient medications on file. (Ophthalmic Drugs)   No current facility-administered medications for this visit. (Ophthalmic Drugs)   Current Outpatient Medications (Other)  Medication Sig   Blood Glucose Monitoring Suppl (ONETOUCH VERIO FLEX SYSTEM) w/Device KIT 1 application by Does not apply route 2 (two) times daily.   Blood Glucose Monitoring Suppl (ONETOUCH VERIO) w/Device KIT Check blood sugars 2 times daily. Code: E11.9   diclofenac Sodium (VOLTAREN) 1 % GEL Apply 2 g topically 4 (four) times daily. (Patient not taking: No sig reported)   glucose blood (ONETOUCH VERIO) test strip USE TO TEST BLOOD SUGAR TWICE DAILY OR AS DIRECTED   Insulin Pen Needle (B-D UF III MINI PEN NEEDLES) 31G X 5 MM MISC USE WITH INSULIN TWICE DAILY   Lancet Devices (ONE TOUCH DELICA LANCING DEV) MISC Check blood sugars 2 times daily. Code: E11.9   Lancets (FREESTYLE) lancets 1 each by Other route 2 (two) times daily. Use as instructed   Lancets (ONETOUCH DELICA PLUS [id number]) MISC USE TO TEST BLOOD SUGAR TWICE DAILY   levothyroxine (SYNTHROID) 88 MCG tablet TAKE 1 TABLET BY MOUTH EVERY DAY BEFORE  BREAKFAST   lisinopril-hydrochlorothiazide (ZESTORETIC) 20-12.5 MG tablet TAKE 1 TABLET BY MOUTH DAILY   metFORMIN (GLUCOPHAGE-XR) 500 MG 24 hr tablet Take 1 tablet (500 mg total) by mouth in the morning and at bedtime.   Multiple Vitamin (MULTIVITAMIN WITH MINERALS) TABS tablet Take 1 tablet by mouth daily. With Vitamin D3   rosuvastatin (CRESTOR) 5 MG tablet Take 1 tablet (5 mg total) by mouth daily.   Semaglutide, 1 MG/DOSE, 2 MG/1.5ML SOPN Inject 1 mg into the skin once a week.   TRESIBA FLEXTOUCH 100 UNIT/ML FlexTouch Pen ADMINISTER 70 UNITS UNDER THE SKIN EVERY DAY AS DIRECTED   No current facility-administered medications for this visit. (Other)      REVIEW OF SYSTEMS:    ALLERGIES Allergies  Allergen Reactions   Gabapentin Nausea Only   Vancomycin Itching    Pre-medicated with benadryl and tylenol 1 hr before administration of vanc    PAST MEDICAL HISTORY Past Medical History:  Diagnosis Date   Anatomical narrow angle glaucoma with borderline intraocular pressure, right 02/01/2020   Arthritis    Diabetes mellitus    DJD (degenerative joint disease) 10/07/2008   DJD (degenerative joint disease)    Family history of adverse reaction to anesthesia    MOTHER WAS DIFFICULT TO WAKE UP   Hyperlipidemia    Hypertension  Hypothyroidism    Nausea    INTERMITANT - TAKES PRILOSEC (DENIES REFLUX)   Obesity    Osteomyelitis of ankle (HCC)    Complications of Right ankle injury   Past Surgical History:  Procedure Laterality Date   ABDOMINAL HYSTERECTOMY     ANKLE FUSION  02/1996   Right ankle tib/talar   BACK SURGERY  1984   BREAST EXCISIONAL BIOPSY Right    CARPAL TUNNEL RELEASE  1993   Right arm   CHOLECYSTECTOMY     EYE SURGERY Left    NECK MASS EXCISION  12/29/2001   right eye laser     TONSILLECTOMY     Childhood   TOTAL KNEE ARTHROPLASTY Left 12/21/2015   Procedure: LEFT TOTAL KNEE ARTHROPLASTY WITH COMPLEX COMPUTER NAVIGATION ;  Surgeon: Rod Can, MD;   Location: [facility];  Service: Orthopedics;  Laterality: Left;   TOTAL SHOULDER ARTHROPLASTY Right 07/11/2016   Procedure: RIGHT TOTAL SHOULDER ARTHROPLASTY;  Surgeon: Justice Britain, MD;  Location: [facility];  Service: Orthopedics;  Laterality: Right;   TOTAL SHOULDER ARTHROPLASTY Left 11/04/2019   Procedure: TOTAL SHOULDER ARTHROPLASTY;  Surgeon: Justice Britain, MD;  Location: [facility];  Service: Orthopedics;  Laterality: Left;  175mn    FAMILY HISTORY Family History  Problem Relation Age of Onset   Stroke Mother    Heart failure Mother    Multiple myeloma Mother        Deceased at 778due to same   Heart attack Father        First MI- early 555's  Diabetes Sister        DM2-lost weight-now okay   Breast cancer Paternal Aunt    Diabetes Brother    Heart attack Son     SOCIAL HISTORY Social History   Tobacco Use   Smoking status: Never   Smokeless tobacco: Never   Tobacco comments:    No tobacco currently -   Vaping Use   Vaping Use: Never used  Substance Use Topics   Alcohol use: No    Alcohol/week: 0.0 standard drinks   Drug use: No         OPHTHALMIC EXAM:  Base Eye Exam     Visual Acuity (ETDRS)       Right Left   Dist [place] 20/25 -1 CF_0    Dist ph [place]  NI         Tonometry (Tonopen, 2:15 PM)       Right Left   Pressure 13 8         Pupils       Dark Light Shape React APD   Right 4 3 Round Brisk None   Left 4 4 Round Minimal +1         Visual Fields (Counting fingers)       Left Right    Full Full         Extraocular Movement       Right Left    Full, Ortho Full, Ortho         Neuro/Psych     Oriented x3: Yes   Mood/Affect: Normal         Dilation     Both eyes: 1.0% Mydriacyl, 2.5% Phenylephrine @ 2:14 PM           Slit Lamp and Fundus Exam     External Exam       Right Left   External Normal Normal  Slit Lamp Exam       Right Left   Lids/Lashes Normal Normal   Conjunctiva/Sclera White and quiet White  and quiet   Cornea Clear Clear   Anterior Chamber Deep and quiet Deep and quiet   Iris Round and reactive Round and reactive   Lens Posterior chamber intraocular lens, Centered posterior chamber intraocular lens Posterior chamber intraocular lens, Centered posterior chamber intraocular lens   Anterior Vitreous Normal Normal         Fundus Exam       Right Left   Posterior Vitreous Posterior vitreous detachment Posterior vitreous detachment, Central vitreous floaters   Disc Normal Normal   C/D Ratio 0.4 0.35   Macula Epiretinal membrane , Retinal pigment epithelial mottling, Disciform scar in a foveal location, residual SR heme temporal to the fovea   Vessels no DR no DR   Periphery Normal Normal            IMAGING AND PROCEDURES  Imaging and Procedures for 07/10/21  OCT, Retina - OU - Both Eyes       Right Eye Quality was borderline. Scan locations included subfoveal. Central Foveal Thickness: 287. Progression has been stable. Findings include epiretinal membrane, abnormal foveal contour.   Left Eye Quality was good. Scan locations included subfoveal. Central Foveal Thickness: 289. Progression has improved. Findings include cystoid macular edema, disciform scar, subretinal fluid, subretinal hyper-reflective material, abnormal foveal contour.   Notes Subretinal scarring, disciform scar, slight increase subretinal fluid, today at 8 -week follow-up OS.  We will repeat injection today and may maintain interval of the evaluation to 8 weeks  OD with irregular epiretinal membrane foveal distortion yet stable over time with no outer retinal changes.  We will continue to monitor and observe     Intravitreal Injection, Pharmacologic Agent - OS - Left Eye       Time Out 07/10/2021. 2:44 PM. Confirmed correct patient, procedure, site, and patient consented.   Anesthesia Topical anesthesia was used. Anesthetic medications included Lidocaine 4%.   Procedure Preparation  included Tobramycin 0.3%, 10% betadine to eyelids, 5% betadine to ocular surface. A 30 gauge needle was used.   Injection: 2.5 mg bevacizumab 2.5 MG/0.1ML   Route: Intravitreal, Site: Left Eye   NDC: [phone], Lot: [id number]   Post-op Post injection exam found visual acuity of at least counting fingers. The patient tolerated the procedure well. There were no complications. The patient received written and verbal post procedure care education. Post injection medications included ocuflox.              ASSESSMENT/PLAN:  Exudative age-related macular degeneration of left eye with active choroidal neovascularization (HCC) Exudative CNVM Subfoveal OS with chronic disease activity threatening subfoveal region as well as overlying retina, repeat injection today at 8 weeks and maintain 8 to 9-week interval next  Intermediate stage nonexudative age-related macular degeneration of right eye No signs of CNVM OD  Right epiretinal membrane Very severe topographic distortion yet good acuity and no outer retinal changes.  We will thus observe     ICD-10-CM   1. Exudative age-related macular degeneration of left eye with active choroidal neovascularization (HCC)  H35.3221 OCT, Retina - OU - Both Eyes    Intravitreal Injection, Pharmacologic Agent - OS - Left Eye    bevacizumab (AVASTIN) SOSY 2.5 mg    2. Intermediate stage nonexudative age-related macular degeneration of right eye  H35.3112     3. Right epiretinal membrane  H35.371  1.  OS with chronic subfoveal disciform scar with overlying intraretinal fluid persisting.  In order to prevent enlargement scotoma we will retreat again today and maintain 8-week follow-up interval  2.  OD topographic distortion from epiretinal membrane yet with good acuity and no interval change, will observe  3.  OD, with no sign of CNVM  Ophthalmic Meds Ordered this visit:  Meds ordered this encounter  Medications   bevacizumab (AVASTIN) SOSY 2.5  mg       Return in about 8 weeks (around 09/04/2021) for DILATE OU, AVASTIN OCT, OS.  There are no Patient Instructions on file for this visit.   Explained the diagnoses, plan, and follow up with the patient and they expressed understanding.  Patient expressed understanding of the importance of proper follow up care.   Clent Demark Franklin Baumbach M.D. Diseases & Surgery of the Retina and Vitreous Retina & Diabetic [place] 07/10/21     Abbreviations: M myopia (nearsighted); A astigmatism; H hyperopia (farsighted); P presbyopia; Mrx spectacle prescription;  CTL contact lenses; OD right eye; OS left eye; OU both eyes  XT exotropia; ET esotropia; PEK punctate epithelial keratitis; PEE punctate epithelial erosions; DES dry eye syndrome; MGD meibomian gland dysfunction; ATs artificial tears; PFAT's preservative free artificial tears; [place] nuclear sclerotic cataract; PSC posterior subcapsular cataract; ERM epi-retinal membrane; PVD posterior vitreous detachment; RD retinal detachment; DM diabetes mellitus; DR diabetic retinopathy; NPDR non-proliferative diabetic retinopathy; PDR proliferative diabetic retinopathy; CSME clinically significant macular edema; DME diabetic macular edema; dbh dot blot hemorrhages; CWS cotton wool spot; POAG primary open angle glaucoma; C/D cup-to-disc ratio; HVF humphrey visual field; GVF goldmann visual field; OCT optical coherence tomography; IOP intraocular pressure; BRVO Branch retinal vein occlusion; CRVO central retinal vein occlusion; CRAO central retinal artery occlusion; BRAO branch retinal artery occlusion; RT retinal tear; SB scleral buckle; PPV pars plana vitrectomy; VH Vitreous hemorrhage; PRP panretinal laser photocoagulation; IVK intravitreal kenalog; VMT vitreomacular traction; MH Macular hole;  NVD neovascularization of the disc; NVE neovascularization elsewhere; AREDS age related eye disease study; ARMD age related macular degeneration; POAG primary open angle  glaucoma; EBMD epithelial/anterior basement membrane dystrophy; ACIOL anterior chamber intraocular lens; IOL intraocular lens; PCIOL posterior chamber intraocular lens; Phaco/IOL phacoemulsification with intraocular lens placement; [place] photorefractive keratectomy; LASIK laser assisted in situ keratomileusis; HTN hypertension; DM diabetes mellitus; COPD chronic obstructive pulmonary disease

## 2021-07-22 ENCOUNTER — Other Ambulatory Visit: Payer: Self-pay | Admitting: Family Medicine

## 2021-07-22 DIAGNOSIS — E119 Type 2 diabetes mellitus without complications: Secondary | ICD-10-CM

## 2021-07-30 ENCOUNTER — Encounter: Payer: Self-pay | Admitting: Family Medicine

## 2021-07-30 ENCOUNTER — Ambulatory Visit (INDEPENDENT_AMBULATORY_CARE_PROVIDER_SITE_OTHER): Payer: Medicare Other | Admitting: Family Medicine

## 2021-07-30 ENCOUNTER — Other Ambulatory Visit: Payer: Self-pay

## 2021-07-30 VITALS — BP 177/80 | HR 66 | Ht 67.0 in | Wt 236.4 lb

## 2021-07-30 DIAGNOSIS — I10 Essential (primary) hypertension: Secondary | ICD-10-CM

## 2021-07-30 DIAGNOSIS — Z794 Long term (current) use of insulin: Secondary | ICD-10-CM

## 2021-07-30 DIAGNOSIS — R110 Nausea: Secondary | ICD-10-CM | POA: Diagnosis not present

## 2021-07-30 DIAGNOSIS — Z23 Encounter for immunization: Secondary | ICD-10-CM | POA: Diagnosis not present

## 2021-07-30 DIAGNOSIS — E11319 Type 2 diabetes mellitus with unspecified diabetic retinopathy without macular edema: Secondary | ICD-10-CM | POA: Diagnosis not present

## 2021-07-30 LAB — POCT GLYCOSYLATED HEMOGLOBIN (HGB A1C): HbA1c, POC (controlled diabetic range): 7.4 % — AB (ref 0.0–7.0)

## 2021-07-30 MED ORDER — LISINOPRIL-HYDROCHLOROTHIAZIDE 20-12.5 MG PO TABS: 2.0000 | ORAL_TABLET | Freq: Every day | ORAL | 3 refills | Status: DC

## 2021-07-30 NOTE — Assessment & Plan Note (Signed)
And diarrhea.  Will stop metformin x 2 weeks to see if improves.  Ozempic may also be contributing.

## 2021-07-30 NOTE — Assessment & Plan Note (Signed)
Double up on lisinopril HCTZ dose to 40/25

## 2021-07-30 NOTE — Assessment & Plan Note (Signed)
I will decide on DM management after she has been off metformin x 2 weeks.  I hope that I can continue the ozemptic and possibly restart the metformin at same or reduced does.

## 2021-07-30 NOTE — Progress Notes (Signed)
    SUBJECTIVE:   CHIEF COMPLAINT / HPI:   Multiple issues Nausea and diarrhea.  No weight loss.  Wonders if it is her metformin.  She had problems with metformin in the past.  Also on semaglutide x 1 year which may contribute to her nausea. BP up today.  It has also been running high at home.  No CP or DOE. DM.  A1C=7.4 today.  Goal?  I could default to goal of less than 7.0 but given age also consider goal of less than 8.0  States taking treseba 54 units.  Had titrated down due to some low blood sugars.    OBJECTIVE:   BP (!) 177/80   Pulse 66   Ht 5\' 7"  (1.702 m)   Wt 236 lb 6.4 oz (107.2 kg)   SpO2 99%   BMI 37.03 kg/m   Lungs clear Cardiac RRR without m or g Abd benign.  ASSESSMENT/PLAN:   Nausea And diarrhea.  Will stop metformin x 2 weeks to see if improves.  Ozempic may also be contributing.    Type 2 diabetes mellitus with ophthalmic complication (HCC) I will decide on DM management after she has been off metformin x 2 weeks.  I hope that I can continue the ozemptic and possibly restart the metformin at same or reduced does.    Primary hypertension Double up on lisinopril HCTZ dose to [place], [place]

## 2021-07-30 NOTE — Patient Instructions (Addendum)
For the nausea and diarrhea, stop the metformin for 2 weeks and then call me.   Double up on the lisinopril HCTZ for your blood pressure.  Please check your BP on the higher dose and tell me the readings when you call. When we talk in two weeks, we will decide what to do about your diabetes.

## 2021-08-19 ENCOUNTER — Encounter: Payer: Self-pay | Admitting: Family Medicine

## 2021-08-21 ENCOUNTER — Telehealth: Payer: Self-pay

## 2021-08-21 NOTE — Telephone Encounter (Signed)
Called patient to discuss mychart message. Patient reports having intermittent dizziness for the last two weeks, since increasing lisinopril/HCTZ. Patient states she feels the dizziness most often when she bends over or changes positions too quickly. Recent BP readings of 153/76 and 148/78. Patient denies headaches, blurry vision or chest pain.   Scheduled patient for BP follow up next week with Dr. Andria Frames on 08/29/21. Please advise if patient should make any adjustments to medication in the meantime.   ED precautions given. Encouraged patient to change positions slowly and hold on to something when bending over.   Talbot Grumbling, RN

## 2021-08-21 NOTE — Telephone Encounter (Signed)
No medication changes at this time.

## 2021-08-29 ENCOUNTER — Other Ambulatory Visit: Payer: Self-pay

## 2021-08-29 ENCOUNTER — Encounter: Payer: Self-pay | Admitting: Family Medicine

## 2021-08-29 ENCOUNTER — Ambulatory Visit (INDEPENDENT_AMBULATORY_CARE_PROVIDER_SITE_OTHER): Payer: No Typology Code available for payment source | Admitting: Family Medicine

## 2021-08-29 DIAGNOSIS — I10 Essential (primary) hypertension: Secondary | ICD-10-CM | POA: Diagnosis not present

## 2021-08-29 DIAGNOSIS — Z794 Long term (current) use of insulin: Secondary | ICD-10-CM

## 2021-08-29 DIAGNOSIS — E11319 Type 2 diabetes mellitus with unspecified diabetic retinopathy without macular edema: Secondary | ICD-10-CM | POA: Diagnosis not present

## 2021-08-29 NOTE — Patient Instructions (Signed)
You blood pressure is great today.  Let me know if you are having dizzy or light headed spells - that is typically a sign of blood pressure being too low.   I hope the prescription for the meter works.  The pharmacy can send me a request if they need more information.   See me in March for your diabetes check, sooner if problems.

## 2021-08-30 ENCOUNTER — Encounter: Payer: Self-pay | Admitting: Family Medicine

## 2021-08-30 NOTE — Assessment & Plan Note (Signed)
Meter prescribed as requested.

## 2021-08-30 NOTE — Progress Notes (Signed)
° ° °  SUBJECTIVE:   CHIEF COMPLAINT / HPI:   Hypertension FU.  Patient ran some high blood pressures last month.  And she was having lightheaded spells on standing.  Taking meds regularly now.  Light headed spells have resolved.    Wants a prescription for an new diabetic test meter.  HPDP showing due for diabetic eye exam, but she sees retinal specialist, [facility], regularly.    OBJECTIVE:   BP 117/65    Pulse 74    Ht 5\' 7"  (1.702 m)    Wt 237 lb 3.2 oz (107.6 kg)    SpO2 96%    BMI 37.15 kg/m   Lungs clear Cardiac RRR without m or g   ASSESSMENT/PLAN:   Primary hypertension Good control.  Doubt over medicated.  Educated: She thought being lightheaded was a sign of high blood pressure.  I educated that it is more common with low blood pressure.  Type 2 diabetes mellitus with ophthalmic complication (HCC) Meter prescribed as requested.       [name], MD [place]

## 2021-08-30 NOTE — Assessment & Plan Note (Signed)
Good control.  Doubt over medicated.  Educated: She thought being lightheaded was a sign of high blood pressure.  I educated that it is more common with low blood pressure.

## 2021-09-04 ENCOUNTER — Other Ambulatory Visit: Payer: Self-pay

## 2021-09-04 ENCOUNTER — Encounter (INDEPENDENT_AMBULATORY_CARE_PROVIDER_SITE_OTHER): Payer: Self-pay | Admitting: Ophthalmology

## 2021-09-04 ENCOUNTER — Ambulatory Visit (INDEPENDENT_AMBULATORY_CARE_PROVIDER_SITE_OTHER): Payer: No Typology Code available for payment source | Admitting: Ophthalmology

## 2021-09-04 DIAGNOSIS — H353221 Exudative age-related macular degeneration, left eye, with active choroidal neovascularization: Secondary | ICD-10-CM

## 2021-09-04 DIAGNOSIS — H35371 Puckering of macula, right eye: Secondary | ICD-10-CM | POA: Diagnosis not present

## 2021-09-04 MED ORDER — BEVACIZUMAB 2.5 MG/0.1ML IZ SOSY
2.5000 mg | PREFILLED_SYRINGE | INTRAVITREAL | Status: AC | PRN
  Administered 2021-09-04: 2.5 mg via INTRAVITREAL

## 2021-09-04 NOTE — Progress Notes (Signed)
09/04/2021     CHIEF COMPLAINT Patient presents for  Chief Complaint  Patient presents with   Retina Follow Up      HISTORY OF PRESENT ILLNESS: Kaitlyn Ferguson is a 77 y.o. female who presents to the clinic today for:   HPI     Retina Follow Up           Diagnosis: Wet AMD   Laterality: left eye   Onset: 8 weeks ago   Duration: 8 weeks   Course: stable         Comments   8 week fu OU oct Avastin OS. Patient states vision is stable and unchanged since last visit. Denies any new floaters or FOL.       Last edited by Laurin Coder on 09/04/2021  1:45 PM.      Referring physician: [name], MD [place],  [place] [address]  HISTORICAL INFORMATION:   Selected notes from the MEDICAL RECORD NUMBER    Lab Results  Component Value Date   HGBA1C 7.4 (A) 07/30/2021     CURRENT MEDICATIONS: No current outpatient medications on file. (Ophthalmic Drugs)   No current facility-administered medications for this visit. (Ophthalmic Drugs)   Current Outpatient Medications (Other)  Medication Sig   Blood Glucose Monitoring Suppl (ONETOUCH VERIO FLEX SYSTEM) w/Device KIT 1 application by Does not apply route 2 (two) times daily.   Blood Glucose Monitoring Suppl (ONETOUCH VERIO) w/Device KIT Check blood sugars 2 times daily. Code: E11.9   glucose blood (ONETOUCH VERIO) test strip USE TO TEST BLOOD SUGAR TWICE DAILY OR AS DIRECTED   Insulin Pen Needle (B-D UF III MINI PEN NEEDLES) 31G X 5 MM MISC USE WITH INSULIN TWICE DAILY   Lancet Devices (ONE TOUCH DELICA LANCING DEV) MISC Check blood sugars 2 times daily. Code: E11.9   Lancets (FREESTYLE) lancets 1 each by Other route 2 (two) times daily. Use as instructed   Lancets (ONETOUCH DELICA PLUS VAPOLI10V) MISC USE TO TEST BLOOD SUGAR TWICE DAILY   levothyroxine (SYNTHROID) 88 MCG tablet TAKE 1 TABLET BY MOUTH EVERY DAY BEFORE BREAKFAST   lisinopril-hydrochlorothiazide (ZESTORETIC)  20-12.5 MG tablet Take 2 tablets by mouth daily.   metFORMIN (GLUCOPHAGE-XR) 500 MG 24 hr tablet Take 1 tablet (500 mg total) by mouth in the morning and at bedtime.   Multiple Vitamin (MULTIVITAMIN WITH MINERALS) TABS tablet Take 1 tablet by mouth daily. With Vitamin D3   OZEMPIC, 1 MG/DOSE, 4 MG/3ML SOPN INJECT 1MG INTO THE SKIN ONCE A WEEK   rosuvastatin (CRESTOR) 5 MG tablet Take 1 tablet (5 mg total) by mouth daily.   TRESIBA FLEXTOUCH 100 UNIT/ML FlexTouch Pen ADMINISTER 70 UNITS UNDER THE SKIN EVERY DAY AS DIRECTED (Patient taking differently: Inject 54 Units into the skin daily.)   No current facility-administered medications for this visit. (Other)      REVIEW OF SYSTEMS:    ALLERGIES Allergies  Allergen Reactions   Gabapentin Nausea Only   Vancomycin Itching    Pre-medicated with benadryl and tylenol 1 hr before administration of vanc    PAST MEDICAL HISTORY Past Medical History:  Diagnosis Date   Anatomical narrow angle glaucoma with borderline intraocular pressure, right 02/01/2020   Arthritis    Diabetes mellitus    DJD (degenerative joint disease) 10/07/2008   DJD (degenerative joint disease)    Family history of adverse reaction to anesthesia    MOTHER WAS DIFFICULT TO WAKE UP   Hyperlipidemia  Hypertension    Hypothyroidism    Nausea    INTERMITANT - TAKES PRILOSEC (DENIES REFLUX)   Obesity    Osteomyelitis of ankle (HCC)    Complications of Right ankle injury   Past Surgical History:  Procedure Laterality Date   ABDOMINAL HYSTERECTOMY     ANKLE FUSION  02/1996   Right ankle tib/talar   BACK SURGERY  1984   BREAST EXCISIONAL BIOPSY Right    CARPAL TUNNEL RELEASE  1993   Right arm   CHOLECYSTECTOMY     EYE SURGERY Left    NECK MASS EXCISION  12/29/2001   right eye laser     TONSILLECTOMY     Childhood   TOTAL KNEE ARTHROPLASTY Left 12/21/2015   Procedure: LEFT TOTAL KNEE ARTHROPLASTY WITH COMPLEX COMPUTER NAVIGATION ;  Surgeon: Rod Can,  MD;  Location: [facility];  Service: Orthopedics;  Laterality: Left;   TOTAL SHOULDER ARTHROPLASTY Right 07/11/2016   Procedure: RIGHT TOTAL SHOULDER ARTHROPLASTY;  Surgeon: Justice Britain, MD;  Location: [facility];  Service: Orthopedics;  Laterality: Right;   TOTAL SHOULDER ARTHROPLASTY Left 11/04/2019   Procedure: TOTAL SHOULDER ARTHROPLASTY;  Surgeon: Justice Britain, MD;  Location: [facility];  Service: Orthopedics;  Laterality: Left;  153mn    FAMILY HISTORY Family History  Problem Relation Age of Onset   Stroke Mother    Heart failure Mother    Multiple myeloma Mother        Deceased at 774due to same   Heart attack Father        First MI- early 537's  Diabetes Sister        DM2-lost weight-now okay   Breast cancer Paternal Aunt    Diabetes Brother    Heart attack Son     SOCIAL HISTORY Social History   Tobacco Use   Smoking status: Never   Smokeless tobacco: Never   Tobacco comments:    No tobacco currently -   Vaping Use   Vaping Use: Never used  Substance Use Topics   Alcohol use: No    Alcohol/week: 0.0 standard drinks   Drug use: No         OPHTHALMIC EXAM:  Base Eye Exam     Visual Acuity (ETDRS)       Right Left   Dist [place] 20/25 -2 CF at 4'   Dist ph [place]  20/400         Tonometry (Tonopen, 1:47 PM)       Right Left   Pressure 14 11         Pupils       Pupils Dark Light APD   Right PERRL 4 3 None   Left PERRL 4 4 None         Extraocular Movement       Right Left    Full Full         Neuro/Psych     Oriented x3: Yes   Mood/Affect: Normal         Dilation     Both eyes: 1.0% Mydriacyl, 2.5% Phenylephrine @ 1:47 PM           Slit Lamp and Fundus Exam     External Exam       Right Left   External Normal Normal         Slit Lamp Exam       Right Left   Lids/Lashes Normal Normal   Conjunctiva/Sclera White and quiet White and  quiet   Cornea Clear Clear   Anterior Chamber Deep and quiet Deep and quiet   Iris Round  and reactive Round and reactive   Lens Posterior chamber intraocular lens, Centered posterior chamber intraocular lens Posterior chamber intraocular lens, Centered posterior chamber intraocular lens   Anterior Vitreous Normal Normal         Fundus Exam       Right Left   Posterior Vitreous Posterior vitreous detachment Posterior vitreous detachment, Central vitreous floaters   Disc Normal Normal   C/D Ratio 0.4 0.35   Macula Epiretinal membrane , Retinal pigment epithelial mottling, Disciform scar in a foveal location, residual SR heme temporal to the fovea   Vessels no DR no DR   Periphery Normal Normal            IMAGING AND PROCEDURES  Imaging and Procedures for 09/04/21  Intravitreal Injection, Pharmacologic Agent - OS - Left Eye       Time Out 09/04/2021. 2:25 PM. Confirmed correct patient, procedure, site, and patient consented.   Anesthesia Topical anesthesia was used. Anesthetic medications included Lidocaine 4%.   Procedure Preparation included Tobramycin 0.3%, 10% betadine to eyelids, 5% betadine to ocular surface. A 30 gauge needle was used.   Injection: 2.5 mg bevacizumab 2.5 MG/0.1ML   Route: Intravitreal, Site: Left Eye   NDC: [phone], Lot: [id number]   Post-op Post injection exam found visual acuity of at least counting fingers. The patient tolerated the procedure well. There were no complications. The patient received written and verbal post procedure care education. Post injection medications included ocuflox.      OCT, Retina - OU - Both Eyes       Right Eye Quality was borderline. Scan locations included subfoveal. Central Foveal Thickness: 277. Progression has been stable. Findings include epiretinal membrane, abnormal foveal contour.   Left Eye Quality was good. Scan locations included subfoveal. Central Foveal Thickness: 291. Progression has improved. Findings include cystoid macular edema, disciform scar, subretinal hyper-reflective  material, abnormal foveal contour.   Notes Subretinal scarring, disciform scar, slight increase subretinal fluid, today at 8 -week follow-up OS.  We will repeat injection today and may maintain interval of the evaluation to 9 weeks  OD with irregular epiretinal membrane foveal distortion yet stable over time with no outer retinal changes.  We will continue to monitor and observe             ASSESSMENT/PLAN:  Exudative age-related macular degeneration of left eye with active choroidal neovascularization (HCC) OS currently at 8-week follow-up, disciform scar present no intraretinal fluid stable at 8-week interval post Avastin repeat injection today to prevent scotoma enlargement and follow-up next in 9 weeks  Right epiretinal membrane Severe epiretinal membrane topographic distortion yet stable with grade acuity will observe  Type 2 diabetes mellitus with ophthalmic complication (HCC) No detectable diabetic retinopathy     ICD-10-CM   1. Exudative age-related macular degeneration of left eye with active choroidal neovascularization (HCC)  H35.3221 Intravitreal Injection, Pharmacologic Agent - OS - Left Eye    OCT, Retina - OU - Both Eyes    bevacizumab (AVASTIN) SOSY 2.5 mg    2. Right epiretinal membrane  H35.371       1.  2.  3.  Ophthalmic Meds Ordered this visit:  Meds ordered this encounter  Medications   bevacizumab (AVASTIN) SOSY 2.5 mg       Return in about 9 weeks (around 11/06/2021) for DILATE OU, AVASTIN OCT, OS.  There  are no Patient Instructions on file for this visit.   Explained the diagnoses, plan, and follow up with the patient and they expressed understanding.  Patient expressed understanding of the importance of proper follow up care.   Clent Demark Mavin Dyke M.D. Diseases & Surgery of the Retina and Vitreous [facility] [place] 09/04/21     Abbreviations: M myopia (nearsighted); A astigmatism; H hyperopia (farsighted); P presbyopia;  Mrx spectacle prescription;  CTL contact lenses; OD right eye; OS left eye; OU both eyes  XT exotropia; ET esotropia; PEK punctate epithelial keratitis; PEE punctate epithelial erosions; DES dry eye syndrome; MGD meibomian gland dysfunction; ATs artificial tears; PFAT's preservative free artificial tears; [place] nuclear sclerotic cataract; PSC posterior subcapsular cataract; ERM epi-retinal membrane; PVD posterior vitreous detachment; RD retinal detachment; DM diabetes mellitus; DR diabetic retinopathy; NPDR non-proliferative diabetic retinopathy; PDR proliferative diabetic retinopathy; CSME clinically significant macular edema; DME diabetic macular edema; dbh dot blot hemorrhages; CWS cotton wool spot; POAG primary open angle glaucoma; C/D cup-to-disc ratio; HVF humphrey visual field; GVF goldmann visual field; OCT optical coherence tomography; IOP intraocular pressure; BRVO Branch retinal vein occlusion; CRVO central retinal vein occlusion; CRAO central retinal artery occlusion; BRAO branch retinal artery occlusion; RT retinal tear; SB scleral buckle; PPV pars plana vitrectomy; VH Vitreous hemorrhage; PRP panretinal laser photocoagulation; IVK intravitreal kenalog; VMT vitreomacular traction; MH Macular hole;  NVD neovascularization of the disc; NVE neovascularization elsewhere; AREDS age related eye disease study; ARMD age related macular degeneration; POAG primary open angle glaucoma; EBMD epithelial/anterior basement membrane dystrophy; ACIOL anterior chamber intraocular lens; IOL intraocular lens; PCIOL posterior chamber intraocular lens; Phaco/IOL phacoemulsification with intraocular lens placement; [place] photorefractive keratectomy; LASIK laser assisted in situ keratomileusis; HTN hypertension; DM diabetes mellitus; COPD chronic obstructive pulmonary disease

## 2021-09-04 NOTE — Assessment & Plan Note (Signed)
No detectable diabetic retinopathy 

## 2021-09-04 NOTE — Assessment & Plan Note (Signed)
Severe epiretinal membrane topographic distortion yet stable with grade acuity will observe

## 2021-09-04 NOTE — Assessment & Plan Note (Signed)
OS currently at 8-week follow-up, disciform scar present no intraretinal fluid stable at 8-week interval post Avastin repeat injection today to prevent scotoma enlargement and follow-up next in 9 weeks

## 2021-09-12 ENCOUNTER — Other Ambulatory Visit: Payer: Self-pay

## 2021-09-12 ENCOUNTER — Ambulatory Visit (INDEPENDENT_AMBULATORY_CARE_PROVIDER_SITE_OTHER): Payer: No Typology Code available for payment source

## 2021-09-12 DIAGNOSIS — Z0000 Encounter for general adult medical examination without abnormal findings: Secondary | ICD-10-CM

## 2021-09-12 NOTE — Patient Instructions (Addendum)
You spoke to Kaitlyn Ferguson, [name] over the phone for your annual wellness visit.  We discussed goals:   Goals      Exercise 3x per week (30 min per time)     Walk for 15 minutes in AM and walk 15 minutes in PM     HEMOGLOBIN A1C < 7     7.4 last check on 07/30/2021.       We also discussed recommended health maintenance. As discussed, you are due for: Health Maintenance  Topic Date Due   Zoster Vaccines- Shingrix (1 of 2) Never done   COVID-19 Vaccine (3 - Booster for Pfizer series) 01/05/2020   Pneumonia Vaccine 64+ Years old (2 - PPSV23 if available, else PCV20) 09/29/2020   OPHTHALMOLOGY EXAM  07/13/2021   FOOT EXAM  09/28/2021   LIPID PANEL  09/28/2021   HEMOGLOBIN A1C  01/28/2022   COLONOSCOPY (Pts 45-37yr Insurance coverage will need to be confirmed)  09/15/2023   TETANUS/TDAP  01/18/2027   INFLUENZA VACCINE  Completed   DEXA SCAN  Completed   Hepatitis C Screening  Completed   HPV VACCINES  Aged Out   PCP apt scheduled for 3/15 for diabetes management.  Consider vaccines we discussed. Eye apt scheduled for 3/14.  Preventive Care 696Years and Older, Female Preventive care refers to lifestyle choices and visits with your health care provider that can promote health and wellness. Preventive care visits are also called wellness exams. What can I expect for my preventive care visit? Counseling Your health care provider may ask you questions about your: Medical history, including: Past medical problems. Family medical history. Pregnancy and menstrual history. History of falls. Current health, including: Memory and ability to understand (cognition). Emotional well-being. Home life and relationship well-being. Sexual activity and sexual health. Lifestyle, including: Alcohol, nicotine or tobacco, and drug use. Access to firearms. Diet, exercise, and sleep habits. Work and work eStatistician Sunscreen use. Safety issues such as seatbelt and bike helmet  use. Physical exam Your health care provider will check your: Height and weight. These may be used to calculate your BMI (body mass index). BMI is a measurement that tells if you are at a healthy weight. Waist circumference. This measures the distance around your waistline. This measurement also tells if you are at a healthy weight and may help predict your risk of certain diseases, such as type 2 diabetes and high blood pressure. Heart rate and blood pressure. Body temperature. Skin for abnormal spots. What immunizations do I need? Vaccines are usually given at various ages, according to a schedule. Your health care provider will recommend vaccines for you based on your age, medical history, and lifestyle or other factors, such as travel or where you work. What tests do I need? Screening Your health care provider may recommend screening tests for certain conditions. This may include: Lipid and cholesterol levels. Hepatitis C test. Hepatitis B test. HIV (human immunodeficiency virus) test. STI (sexually transmitted infection) testing, if you are at risk. Lung cancer screening. Colorectal cancer screening. Diabetes screening. This is done by checking your blood sugar (glucose) after you have not eaten for a while (fasting). Mammogram. Talk with your health care provider about how often you should have regular mammograms. BRCA-related cancer screening. This may be done if you have a family history of breast, ovarian, tubal, or peritoneal cancers. Bone density scan. This is done to screen for osteoporosis. Talk with your health care provider about your test results, treatment options, and if  necessary, the need for more tests. Follow these instructions at home: Eating and drinking  Eat a diet that includes fresh fruits and vegetables, whole grains, lean protein, and low-fat dairy products. Limit your intake of foods with high amounts of sugar, saturated fats, and salt. Take vitamin and  mineral supplements as recommended by your health care provider. Do not drink alcohol if your health care provider tells you not to drink. If you drink alcohol: Limit how much you have to 0-1 drink a day. Know how much alcohol is in your drink. In the [place] one drink equals one 12 oz bottle of beer (355 mL), one 5 oz glass of wine (148 mL), or one 1 oz glass of hard liquor (44 mL). Lifestyle Brush your teeth every morning and night with fluoride toothpaste. Floss one time each day. Exercise for at least 30 minutes 5 or more days each week. Do not use any products that contain nicotine or tobacco. These products include cigarettes, chewing tobacco, and vaping devices, such as e-cigarettes. If you need help quitting, ask your health care provider. Do not use drugs. If you are sexually active, practice safe sex. Use a condom or other form of protection in order to prevent STIs. Take aspirin only as told by your health care provider. Make sure that you understand how much to take and what form to take. Work with your health care provider to find out whether it is safe and beneficial for you to take aspirin daily. Ask your health care provider if you need to take a cholesterol-lowering medicine (statin). Find healthy ways to manage stress, such as: Meditation, yoga, or listening to music. Journaling. Talking to a trusted person. Spending time with friends and family. Minimize exposure to UV radiation to reduce your risk of skin cancer. Safety Always wear your seat belt while driving or riding in a vehicle. Do not drive: If you have been drinking alcohol. Do not ride with someone who has been drinking. When you are tired or distracted. While texting. If you have been using any mind-altering substances or drugs. Wear a helmet and other protective equipment during sports activities. If you have firearms in your house, make sure you follow all gun safety procedures. What's next? Visit your  health care provider once a year for an annual wellness visit. Ask your health care provider how often you should have your eyes and teeth checked. Stay up to date on all vaccines. This information is not intended to replace advice given to you by your health care provider. Make sure you discuss any questions you have with your health care provider. Document Revised: 02/07/2021 Document Reviewed: 02/07/2021 Elsevier Patient Education  [employer].   Diet Recommendations for Diabetes   1. Eat at least 3 meals and 1-2 snacks per day. Never go more than 4-5 hours while awake without eating. Eat breakfast within the first hour of getting up.   2. Limit starchy foods to TWO per meal and ONE per snack. ONE portion of a starchy  food is equal to the following:   - ONE slice of bread (or its equivalent, such as half of a hamburger bun).   - 1/2 cup of a "scoopable" starchy food such as potatoes or rice.   - 15 grams of Total Carbohydrate as shown on food label.  3. Include at every meal: a protein food, a carb food, and vegetables and/or fruit.   - Obtain twice the volume of vegetables as protein or  carbohydrate foods for both lunch and dinner.   - Fresh or frozen vegetables are best.   - Keep frozen vegetables on hand for a quick vegetable serving.       Starchy (carb) foods: Bread, rice, pasta, potatoes, corn, cereal, grits, crackers, bagels, muffins, all baked goods.  (Fruits, milk, and yogurt also have carbohydrate, but most of these foods will not spike your blood sugar as most starchy foods will.)  A few fruits do cause high blood sugars; use small portions of bananas (limit to 1/2 at a time), grapes, watermelon, oranges, and most tropical fruits.    Protein foods: Meat, fish, poultry, eggs, dairy foods, and beans such as pinto and kidney beans (beans also provide carbohydrate).    Our clinic's number is [phone]. Please call with questions or concerns about what we discussed today.

## 2021-09-12 NOTE — Progress Notes (Signed)
Subjective:   Kaitlyn Ferguson is a 76 y.o. female who presents for Medicare Annual (Subsequent) preventive examination.  Patient consented to have virtual visit and was identified by name and date of birth. Method of visit: Telephone  Encounter participants: Patient: Kaitlyn Ferguson - located at Home Nurse/Provider: Dorna Bloom - located at [facility] Others (if applicable): NA  Review of Systems: Defer to PCP  Cardiac Risk Factors include: advanced age (>4mn, >>20women);hypertension;diabetes mellitus  Objective:   Vitals: There were no vitals taken for this visit.  There is no height or weight on file to calculate BMI.  Advanced Directives 09/12/2021 08/29/2021 02/23/2021 01/11/2021 03/02/2020 01/20/2020 01/19/2020  Does Patient Have a Medical Advance Directive? Yes _0  No  Type of AParamedicof AValliantLiving will - - - - - -  Does patient want to make changes to medical advance directive? No - Patient declined - - - - - -  Copy of HFort Montgomeryin Chart? No - copy requested - - - - - -  Would patient like information on creating a medical advance directive? No - Patient declined No - Patient declined No - Patient declined No - Patient declined No - Patient declined No - Patient declined No - Patient declined   Patient reports she has all of "end of life" information in a safe. Patient reports all of her children are aware of plan.  Tobacco Social History   Tobacco Use  Smoking Status Never  Smokeless Tobacco Never     Clinical Intake:  Pre-visit preparation completed: Yes  How often do you need to have someone help you when you read instructions, pamphlets, or other written materials from your doctor or pharmacy?: 2 - Rarely What is the last grade level you completed in school?: High School  Interpreter Needed?: No  Past Medical History:  Diagnosis Date   Anatomical narrow angle glaucoma with borderline  intraocular pressure, right 02/01/2020   Arthritis    Diabetes mellitus    DJD (degenerative joint disease) 10/07/2008   DJD (degenerative joint disease)    Family history of adverse reaction to anesthesia    MOTHER WAS DIFFICULT TO WAKE UP   Hyperlipidemia    Hypertension    Hypothyroidism    Nausea    INTERMITANT - TAKES PRILOSEC (DENIES REFLUX)   Obesity    Osteomyelitis of ankle ([place]    Complications of Right ankle injury   Past Surgical History:  Procedure Laterality Date   ABDOMINAL HYSTERECTOMY     ANKLE FUSION  02/1996   Right ankle tib/talar   BACK SURGERY  1984   BREAST EXCISIONAL BIOPSY Right    CARPAL TUNNEL RELEASE  1993   Right arm   CHOLECYSTECTOMY     EYE SURGERY Left    NECK MASS EXCISION  12/29/2001   right eye laser     TONSILLECTOMY     Childhood   TOTAL KNEE ARTHROPLASTY Left 12/21/2015   Procedure: LEFT TOTAL KNEE ARTHROPLASTY WITH COMPLEX COMPUTER NAVIGATION ;  Surgeon: BRod Can MD;  Location: [facility];  Service: Orthopedics;  Laterality: Left;   TOTAL SHOULDER ARTHROPLASTY Right 07/11/2016   Procedure: RIGHT TOTAL SHOULDER ARTHROPLASTY;  Surgeon: KJustice Britain MD;  [facility] [facility]  Service: Orthopedics;  Laterality: Right;   TOTAL SHOULDER ARTHROPLASTY Left 11/04/2019   Procedure: TOTAL SHOULDER ARTHROPLASTY;  Surgeon: SJustice Britain MD;  Location: [facility];  Service: Orthopedics;  Laterality: Left;  145mn   Family History  Problem Relation Age of Onset   Stroke Mother    Heart failure Mother    Multiple myeloma Mother        Deceased at 721due to same   Heart attack Father        First MI- early 558's  Diabetes Brother    Lung cancer Daughter    Diabetes Son    Heart attack Son    Obesity Son    Thyroid disease Son    Thyroid disease Son    Thyroid disease Son    Breast cancer Paternal Aunt    Social History   Socioeconomic History   Marital status: Widowed    Spouse name: Not on file   Number of children: 6   Years of  education: 161  Highest education level: GED or equivalent  Occupational History   Occupation: Retired     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    Patient does a lot of actives within her senior community.    Patient host bingo on Mondays and workout classes on Tuesdays and Thursdays.   Patient enjoys crafts and no sew blankets. Often makes them for the homeless or newborns.   Patient has 6 children, they all live within the area and are all close.   Patient still drives and owns her own vehicle.    Social Determinants of Health   Financial Resource Strain: Low Risk    Difficulty of Paying Living Expenses: Not hard at all  Food Insecurity: No Food Insecurity   Worried About RCharity fundraiserin the Last Year: Never true   RSis[place] the Last Year: Never true  Transportation Needs: No Transportation Needs   Lack of Transportation (Medical): No   Lack of Transportation (Non-Medical): No  Physical Activity: Inactive   Days of Exercise per Week: 0 days   Minutes of Exercise per Session: 0 min  Stress: No Stress Concern Present   Feeling of Stress : Not at all  Social Connections: Moderately Isolated   Frequency of Communication with Friends and Family: More than three times a week   Frequency of Social Gatherings with Friends and Family: More than three times a week   Attends Religious Services: 1 to 4 times per year   Active Member of C[employer]sor Organizations: No   Attends CArchivistMeetings: Never   Marital Status: Widowed   Outpatient Encounter Medications as of 09/12/2021  Medication Sig   Blood  Glucose Monitoring Suppl (ONETOUCH VERIO) w/Device KIT Check blood sugars 2 times daily. Code: E11.9   glucose blood (ONETOUCH VERIO) test strip USE TO TEST BLOOD SUGAR TWICE DAILY OR AS DIRECTED   Insulin Pen Needle (B-D UF III MINI PEN NEEDLES) 31G X 5 MM MISC USE WITH INSULIN TWICE DAILY   Lancet Devices (ONE TOUCH DELICA LANCING DEV) MISC Check blood sugars 2 times daily. Code: E11.9   levothyroxine (SYNTHROID) 88 MCG tablet TAKE 1 TABLET BY MOUTH EVERY DAY BEFORE BREAKFAST  lisinopril-hydrochlorothiazide (ZESTORETIC) 20-12.5 MG tablet Take 2 tablets by mouth daily.   Multiple Vitamin (MULTIVITAMIN WITH MINERALS) TABS tablet Take 1 tablet by mouth daily. With Vitamin D3   OZEMPIC, 1 MG/DOSE, 4 MG/3ML SOPN INJECT 1MG INTO THE SKIN ONCE A WEEK   rosuvastatin (CRESTOR) 5 MG tablet Take 1 tablet (5 mg total) by mouth daily.   TRESIBA FLEXTOUCH 100 UNIT/ML FlexTouch Pen ADMINISTER 70 UNITS UNDER THE SKIN EVERY DAY AS DIRECTED (Patient taking differently: Inject 54 Units into the skin daily.)   metFORMIN (GLUCOPHAGE-XR) 500 MG 24 hr tablet Take 1 tablet (500 mg total) by mouth in the morning and at bedtime. (Patient not taking: Reported on 09/12/2021)   [DISCONTINUED] Blood Glucose Monitoring Suppl (ONETOUCH VERIO FLEX SYSTEM) w/Device KIT 1 application by Does not apply route 2 (two) times daily.   [DISCONTINUED] Insulin Syringe-Needle U-100 31G X 5/16" 1 ML MISC Use with insulin as directed.   [DISCONTINUED] Lancets (FREESTYLE) lancets 1 each by Other route 2 (two) times daily. Use as instructed   [DISCONTINUED] Lancets (ONETOUCH DELICA PLUS [id number]) MISC USE TO TEST BLOOD SUGAR TWICE DAILY   No facility-administered encounter medications on file as of 09/12/2021.   Activities of Daily Living In your present state of health, do you have any difficulty performing the following activities: 09/12/2021  Hearing? N  Vision? N  Difficulty concentrating or making decisions? N  Walking or climbing  stairs? N  Dressing or bathing? N  Doing errands, shopping? N  Preparing Food and eating ? N  Using the Toilet? N  In the past six months, have you accidently leaked urine? N  Do you have problems with loss of bowel control? N  Managing your Medications? N  Managing your Finances? N  Housekeeping or managing your Housekeeping? N  Some recent data might be hidden   Patient Care Team: [name], MD as PCP - General (Family Medicine) Kennith Center, RD as Dietitian (Family Medicine) Clent Jacks, MD as Consulting Physician (Ophthalmology) Zadie Rhine Clent Demark, MD as Consulting Physician ([facility])    Assessment:   This is a routine wellness examination for [place].  Exercise Activities and Dietary recommendations Current Exercise Habits: The patient does not participate in regular exercise at present, Exercise limited by: None identified   Goals      Exercise 3x per week (30 min per time)     Walk for 15 minutes in AM and walk 15 minutes in PM     HEMOGLOBIN A1C < 7     7.4 last check on 07/30/2021.       Fall Risk Fall Risk  09/12/2021 02/23/2021 09/28/2020 06/15/2020 01/20/2020  Falls in the past year? 0 0 0 0 0  Number falls in past yr: 0 0 0 0 0  Injury with Fall? 0 0 - - 0  Risk for fall due to : No Fall Risks - - - -  Follow up Falls prevention discussed;Falls evaluation completed - Falls evaluation completed Falls evaluation completed -   Patient reports normal gait and balance. Patient does not use assistive devices to ambulate.   Is the patient's home free of loose throw rugs in walkways, pet beds, electrical cords, etc?   yes      Grab bars in the bathroom? yes      Handrails on the stairs?   yes      Adequate lighting?   yes  Patient rating of health (0-10) scale: 8  Depression Screen PHQ  2/9 Scores 09/12/2021 08/29/2021 07/30/2021 04/25/2021  PHQ - 2 Score 0 0 0 0  PHQ- 9 Score 0 0 1 1    Cognitive Function 6CIT Screen 09/12/2021 06/17/2019  What  Year? 0 points 0 points  What month? 0 points 0 points  What time? 0 points 0 points  Count back from 20 0 points 0 points  Months in reverse 0 points 0 points  Repeat phrase 0 points 0 points  Total Score 0 0   Immunization History  Administered Date(s) Administered   Fluad Quad(high Dose 65+) 06/15/2020, 07/30/2021   Influenza Split 05/10/2011, 07/26/2011, 05/12/2012   Influenza Whole 06/01/2008, 08/11/2009, 09/05/2010   Influenza, High Dose Seasonal PF 05/26/2019   Influenza,inj,Quad PF,6+ Mos 08/04/2013, 06/01/2014, 05/16/2015, 05/23/2016, 05/08/2017, 04/23/2018   Influenza-Unspecified 05/11/2019   PFIZER(Purple Top)SARS-COV-2 Vaccination 10/17/2019, 11/10/2019   Pneumococcal Conjugate-13 09/30/2019   Td 10/01/2004   Tdap 01/17/2017   Screening Tests Health Maintenance  Topic Date Due   Zoster Vaccines- Shingrix (1 of 2) Never done   COVID-19 Vaccine (3 - Booster for Pfizer series) 01/05/2020   Pneumonia Vaccine 60+ Years old (2 - PPSV23 if available, else PCV20) 09/29/2020   OPHTHALMOLOGY EXAM  07/13/2021   FOOT EXAM  09/28/2021   LIPID PANEL  09/28/2021   HEMOGLOBIN A1C  01/28/2022   COLONOSCOPY (Pts 45-57yr Insurance coverage will need to be confirmed)  09/15/2023   TETANUS/TDAP  01/18/2027   INFLUENZA VACCINE  Completed   DEXA SCAN  Completed   Hepatitis C Screening  Completed   HPV VACCINES  Aged Out   Covid-19 vaccine status: Patient has received #2 doses of PBigelow Patient declines covid boosters. Education has been provided regarding the importance of this vaccine. Advised may receive this vaccine at local pharmacy or Health Dept. Aware to provide a copy of the vaccination record if obtained from local pharmacy or Health Dept. Verbalized acceptance and understanding.   Qualifies for Shingles Vaccine? Yes   Shingrix Completed: No, Education has been provided regarding the importance of this vaccine. Advised may receive this vaccine at local pharmacy or Health  Dept. Aware to provide a copy of the vaccination record if obtained from local pharmacy or Health Dept. Verbalized acceptance and understanding.   Cancer Screenings: Lung: Low Dose CT Chest recommended if Age 76-80years, 20 pack-year currently smoking OR have quit w/in 15years. Patient does not qualify. Breast:  Up to date on Mammogram? Yes   Up to date of Bone Density/Dexa? Yes Colorectal: UTD- Due 2025 Cervical Cancer: Hysterectomy  Additional Screenings: Hepatitis C Screening: Completed   Plan:  PCP apt scheduled for 3/15 for diabetes management.  Consider vaccines we discussed. Eye apt scheduled for 3/14.  I have personally reviewed and noted the following in the patients chart:   Medical and social history Use of alcohol, tobacco or illicit drugs  Current medications and supplements Functional ability and status Nutritional status Physical activity Advanced directives List of other physicians Hospitalizations, surgeries, and ER visits in previous 12 months Vitals Screenings to include cognitive, depression, and falls Referrals and appointments  In addition, I have reviewed and discussed with patient certain preventive protocols, quality metrics, and best practice recommendations. A written personalized care plan for preventive services as well as general preventive health recommendations were provided to patient.  EDorna Bloom [place] 09/12/2021

## 2021-09-12 NOTE — Progress Notes (Signed)
I have reviewed this visit and agree with the documentation.   

## 2021-09-21 ENCOUNTER — Other Ambulatory Visit: Payer: Self-pay | Admitting: Family Medicine

## 2021-10-22 DIAGNOSIS — E785 Hyperlipidemia, unspecified: Secondary | ICD-10-CM | POA: Diagnosis not present

## 2021-10-22 DIAGNOSIS — Z0000 Encounter for general adult medical examination without abnormal findings: Secondary | ICD-10-CM | POA: Diagnosis not present

## 2021-10-22 DIAGNOSIS — I10 Essential (primary) hypertension: Secondary | ICD-10-CM | POA: Diagnosis not present

## 2021-10-22 DIAGNOSIS — M159 Polyosteoarthritis, unspecified: Secondary | ICD-10-CM | POA: Diagnosis not present

## 2021-11-06 ENCOUNTER — Encounter (INDEPENDENT_AMBULATORY_CARE_PROVIDER_SITE_OTHER): Payer: Self-pay | Admitting: Ophthalmology

## 2021-11-06 ENCOUNTER — Other Ambulatory Visit: Payer: Self-pay

## 2021-11-06 ENCOUNTER — Encounter (INDEPENDENT_AMBULATORY_CARE_PROVIDER_SITE_OTHER): Payer: Medicare Other | Admitting: Ophthalmology

## 2021-11-06 ENCOUNTER — Ambulatory Visit (INDEPENDENT_AMBULATORY_CARE_PROVIDER_SITE_OTHER): Payer: Medicare Other | Admitting: Ophthalmology

## 2021-11-06 DIAGNOSIS — H353112 Nonexudative age-related macular degeneration, right eye, intermediate dry stage: Secondary | ICD-10-CM | POA: Diagnosis not present

## 2021-11-06 DIAGNOSIS — H353221 Exudative age-related macular degeneration, left eye, with active choroidal neovascularization: Secondary | ICD-10-CM | POA: Diagnosis not present

## 2021-11-06 DIAGNOSIS — H35371 Puckering of macula, right eye: Secondary | ICD-10-CM | POA: Diagnosis not present

## 2021-11-06 MED ORDER — BEVACIZUMAB 2.5 MG/0.1ML IZ SOSY
2.5000 mg | PREFILLED_SYRINGE | INTRAVITREAL | Status: AC | PRN
  Administered 2021-11-06: 2.5 mg via INTRAVITREAL

## 2021-11-06 NOTE — Assessment & Plan Note (Signed)
No detectable diabetic retinopathy OU 

## 2021-11-06 NOTE — Assessment & Plan Note (Signed)
No sign of CNVM OD 

## 2021-11-06 NOTE — Assessment & Plan Note (Signed)
OS today quiet disease subfoveal no extension of disease at 9-week interval.  Acuity limited by subfoveal scarring subretinal fibrosis ?

## 2021-11-06 NOTE — Assessment & Plan Note (Signed)
Moderate topographic distortion OD ?

## 2021-11-06 NOTE — Progress Notes (Signed)
? ? ?11/06/2021 ? ?  ? ?CHIEF COMPLAINT ?Patient presents for  ?Chief Complaint  ?Patient presents with  ? Macular Degeneration  ? ? ? ? ?HISTORY OF PRESENT ILLNESS: ?Kaitlyn Ferguson is a 76 y.o. female who presents to the clinic today for:  ? ?HPI   ?9 week fu OU oct Avastin OS. ?Patient states vision is stable and unchanged since last visit. Denies any new floaters or FOL. ? ?Last edited by Laurin Coder on 11/06/2021  1:23 PM.  ?  ? ? ?Referring physician: ?[name], MD ?[address] ?[place],  [place] [address] ? ?HISTORICAL INFORMATION:  ? ?Selected notes from the [facility] ?  ? ?Lab Results  ?Component Value Date  ? HGBA1C 7.4 (A) 07/30/2021  ?  ? ?CURRENT MEDICATIONS: ?No current outpatient medications on file. (Ophthalmic Drugs)  ? ?No current facility-administered medications for this visit. (Ophthalmic Drugs)  ? ?Current Outpatient Medications (Other)  ?Medication Sig  ? Blood Glucose Monitoring Suppl (ONETOUCH VERIO) w/Device KIT Check blood sugars 2 times daily. Code: E11.9  ? glucose blood (ONETOUCH VERIO) test strip USE AS DIRECTED TWICE DAILY TO TEST BLOOD SUGAR  ? Insulin Pen Needle (B-D UF III MINI PEN NEEDLES) 31G X 5 MM MISC USE WITH INSULIN TWICE DAILY  ? Lancet Devices (ONE TOUCH DELICA LANCING DEV) MISC Check blood sugars 2 times daily. Code: E11.9  ? levothyroxine (SYNTHROID) 88 MCG tablet TAKE 1 TABLET BY MOUTH EVERY DAY BEFORE BREAKFAST  ? lisinopril-hydrochlorothiazide (ZESTORETIC) 20-12.5 MG tablet Take 2 tablets by mouth daily.  ? metFORMIN (GLUCOPHAGE-XR) 500 MG 24 hr tablet Take 1 tablet (500 mg total) by mouth in the morning and at bedtime. (Patient not taking: Reported on 09/12/2021)  ? Multiple Vitamin (MULTIVITAMIN WITH MINERALS) TABS tablet Take 1 tablet by mouth daily. With Vitamin D3  ? OZEMPIC, 1 MG/DOSE, 4 MG/3ML SOPN INJECT 1MG INTO THE SKIN ONCE A WEEK  ? rosuvastatin (CRESTOR) 5 MG tablet Take 1 tablet (5 mg total) by mouth daily.  ? TRESIBA  FLEXTOUCH 100 UNIT/ML FlexTouch Pen ADMINISTER 70 UNITS UNDER THE SKIN EVERY DAY AS DIRECTED (Patient taking differently: Inject 54 Units into the skin daily.)  ? ?No current facility-administered medications for this visit. (Other)  ? ? ? ? ?REVIEW OF SYSTEMS: ? ? ? ?ALLERGIES ?Allergies  ?Allergen Reactions  ? Gabapentin Nausea Only  ? Vancomycin Itching  ?  Pre-medicated with benadryl and tylenol 1 hr before administration of vanc  ? ? ?PAST MEDICAL HISTORY ?Past Medical History:  ?Diagnosis Date  ? Anatomical narrow angle glaucoma with borderline intraocular pressure, right 02/01/2020  ? Arthritis   ? Diabetes mellitus   ? DJD (degenerative joint disease) 10/07/2008  ? DJD (degenerative joint disease)   ? Family history of adverse reaction to anesthesia   ? MOTHER WAS DIFFICULT TO WAKE UP  ? Hyperlipidemia   ? Hypertension   ? Hypothyroidism   ? Nausea   ? INTERMITANT - TAKES PRILOSEC (DENIES REFLUX)  ? Obesity   ? Osteomyelitis of ankle ([place])   ? Complications of Right ankle injury  ? ?Past Surgical History:  ?Procedure Laterality Date  ? ABDOMINAL HYSTERECTOMY    ? ANKLE FUSION  02/1996  ? Right ankle tib/talar  ? BACK SURGERY  1984  ? BREAST EXCISIONAL BIOPSY Right   ? [place]  ? Right arm  ? CHOLECYSTECTOMY    ? EYE SURGERY Left   ? NECK MASS EXCISION  12/29/2001  ? right eye laser    ? TONSILLECTOMY    ? Childhood  ? TOTAL KNEE ARTHROPLASTY Left 12/21/2015  ? Procedure: LEFT TOTAL KNEE ARTHROPLASTY WITH COMPLEX COMPUTER NAVIGATION ;  Surgeon: Rod Can, MD;  Location: [facility];  Service: Orthopedics;  Laterality: Left;  ? TOTAL SHOULDER ARTHROPLASTY Right 07/11/2016  ? Procedure: RIGHT TOTAL SHOULDER ARTHROPLASTY;  Surgeon: Justice Britain, MD;  Location: [facility];  Service: Orthopedics;  Laterality: Right;  ? TOTAL SHOULDER ARTHROPLASTY Left 11/04/2019  ? Procedure: TOTAL SHOULDER ARTHROPLASTY;  Surgeon: Justice Britain, MD;  Location: [facility];  Service: Orthopedics;  Laterality: Left;  145mn   ? ? ?FAMILY HISTORY ?Family History  ?Problem Relation Age of Onset  ? Stroke Mother   ? Heart failure Mother   ? Multiple myeloma Mother   ?     Deceased at 797due to same  ? Heart attack Father   ?     First MI- early 553's ? Diabetes Brother   ? Lung cancer Daughter   ? Diabetes Son   ? Heart attack Son   ? Obesity Son   ? Thyroid disease Son   ? Thyroid disease Son   ? Thyroid disease Son   ? Breast cancer Paternal Aunt   ? ? ?SOCIAL HISTORY ?Social History  ? ?Tobacco Use  ? Smoking status: Never  ? Smokeless tobacco: Never  ?Vaping Use  ? Vaping Use: Never used  ?Substance Use Topics  ? Alcohol use: No  ?  Alcohol/week: 0.0 standard drinks  ? Drug use: No  ? ?  ? ?  ? ?OPHTHALMIC EXAM: ? ?Base Eye Exam   ? ? Visual Acuity (ETDRS)   ? ?   Right Left  ? Dist [place] 20/40 -1+1 CF at 3'  ? Dist ph [place] 20/25 -1 NI  ? ?  ?  ? ? Tonometry (Tonopen, 1:30 PM)   ? ?   Right Left  ? Pressure 17 12  ? ?  ?  ? ? Pupils   ? ?   Pupils Dark Light React APD  ? Right PERRL 4 3  None  ? Left PERRL 4 4 Minimal None  ? ?  ?  ? ? Extraocular Movement   ? ?   Right Left  ?  Full Full  ? ?  ?  ? ? Neuro/Psych   ? ? Oriented x3: Yes  ? Mood/Affect: Normal  ? ?  ?  ? ? Dilation   ? ? Both eyes: 1.0% Mydriacyl, 2.5% Phenylephrine @ 1:30 PM  ? ?  ?  ? ?  ? ?Slit Lamp and Fundus Exam   ? ? External Exam   ? ?   Right Left  ? External Normal Normal  ? ?  ?  ? ? Slit Lamp Exam   ? ?   Right Left  ? Lids/Lashes Normal Normal  ? Conjunctiva/Sclera White and quiet White and quiet  ? Cornea Clear Clear  ? Anterior Chamber Deep and quiet Deep and quiet  ? Iris Round and reactive Round and reactive  ? Lens Posterior chamber intraocular lens, Centered posterior chamber intraocular lens Posterior chamber intraocular lens, Centered posterior chamber intraocular lens  ? Anterior Vitreous Normal Normal  ? ?  ?  ? ? Fundus Exam   ? ?   Right Left  ? Posterior Vitreous Posterior vitreous detachment Posterior vitreous detachment, Central vitreous floaters   ? Disc Normal Normal  ?  C/D Ratio 0.4 0.35  ? Macula Epiretinal membrane , Retinal pigment epithelial mottling, Disciform scar in a foveal location, residual SR heme temporal to the fovea  ? Vessels no DR no DR  ? Periphery Normal Normal  ? ?  ?  ? ?  ? ? ?IMAGING AND PROCEDURES  ?Imaging and Procedures for 11/06/21 ? ?Intravitreal Injection, Pharmacologic Agent - OS - Left Eye   ? ?   ?Time Out ?11/06/2021. 2:08 PM. Confirmed correct patient, procedure, site, and patient consented.  ? ?Anesthesia ?Topical anesthesia was used. Anesthetic medications included Lidocaine 4%.  ? ?Procedure ?Preparation included Tobramycin 0.3%, 10% betadine to eyelids, 5% betadine to ocular surface. A 30 gauge needle was used.  ? ?Injection: ?2.5 mg bevacizumab 2.5 MG/0.1ML ?  Route: Intravitreal, Site: Left Eye ?  [place]: 90240-973-53, Lot: [id number]  ? ?Post-op ?Post injection exam found visual acuity of at least counting fingers. The patient tolerated the procedure well. There were no complications. The patient received written and verbal post procedure care education. Post injection medications included ocuflox.  ? ?  ? ?OCT, Retina - OU - Both Eyes   ? ?   ?Right Eye ?Quality was borderline. Scan locations included subfoveal. Central Foveal Thickness: 278. Progression has been stable. Findings include epiretinal membrane, abnormal foveal contour.  ? ?Left Eye ?Quality was good. Scan locations included subfoveal. Central Foveal Thickness: 294. Progression has improved. Findings include cystoid macular edema, disciform scar, subretinal hyper-reflective material, abnormal foveal contour.  ? ?Notes ?Subretinal scarring, disciform scar, slight increase subretinal fluid, today at 9 -week follow-up OS.  We will repeat injection today and may maintain interval of the evaluation to 9 weeks ? ?OD with irregular epiretinal membrane foveal distortion yet stable over time with no outer retinal changes.  We will continue to monitor and observe ? ?   ? ? ?  ?  ? ?  ?ASSESSMENT/PLAN: ? ?Exudative age-related macular degeneration of left eye with active choroidal neovascularization ([place]) ?OS today quiet disease subfoveal no extension of disease at 9-wee

## 2021-11-07 ENCOUNTER — Ambulatory Visit: Payer: No Typology Code available for payment source | Admitting: Family Medicine

## 2021-11-15 LAB — HM DIABETES EYE EXAM

## 2021-11-19 ENCOUNTER — Other Ambulatory Visit: Payer: Self-pay | Admitting: Family Medicine

## 2021-11-19 DIAGNOSIS — E11319 Type 2 diabetes mellitus with unspecified diabetic retinopathy without macular edema: Secondary | ICD-10-CM

## 2021-11-19 DIAGNOSIS — E119 Type 2 diabetes mellitus without complications: Secondary | ICD-10-CM

## 2022-01-12 ENCOUNTER — Other Ambulatory Visit: Payer: Self-pay | Admitting: Family Medicine

## 2022-01-12 DIAGNOSIS — E119 Type 2 diabetes mellitus without complications: Secondary | ICD-10-CM

## 2022-01-17 ENCOUNTER — Other Ambulatory Visit: Payer: Self-pay | Admitting: Family Medicine

## 2022-01-29 ENCOUNTER — Encounter: Payer: Self-pay | Admitting: *Deleted

## 2022-02-05 ENCOUNTER — Ambulatory Visit (INDEPENDENT_AMBULATORY_CARE_PROVIDER_SITE_OTHER): Payer: Medicare Other | Admitting: Ophthalmology

## 2022-02-05 ENCOUNTER — Encounter (INDEPENDENT_AMBULATORY_CARE_PROVIDER_SITE_OTHER): Payer: Self-pay | Admitting: Ophthalmology

## 2022-02-05 DIAGNOSIS — H35372 Puckering of macula, left eye: Secondary | ICD-10-CM

## 2022-02-05 DIAGNOSIS — H35371 Puckering of macula, right eye: Secondary | ICD-10-CM

## 2022-02-05 DIAGNOSIS — H353221 Exudative age-related macular degeneration, left eye, with active choroidal neovascularization: Secondary | ICD-10-CM

## 2022-02-05 MED ORDER — BEVACIZUMAB 2.5 MG/0.1ML IZ SOSY
2.5000 mg | PREFILLED_SYRINGE | INTRAVITREAL | Status: AC | PRN
  Administered 2022-02-05: 2.5 mg via INTRAVITREAL

## 2022-02-05 NOTE — Assessment & Plan Note (Signed)
Minor OS no impact on acuity temporal aspect of fovea

## 2022-02-05 NOTE — Assessment & Plan Note (Signed)
OS today at 13 weeks post injection intravitreal Avastin and found to have stable subfoveal disciform scar, with no intraretinal fluid increases.  Repeat injection today of Avastin to maintain and follow-up evaluation next in 16 weeks

## 2022-02-05 NOTE — Assessment & Plan Note (Signed)
Severe topographic distortion, likely tangential traction thinning the center of the fovea yet still stable acuity.  We will watch carefully this monocular patient

## 2022-02-05 NOTE — Progress Notes (Signed)
02/05/2022     CHIEF COMPLAINT Patient presents for  Chief Complaint  Patient presents with   Macular Degeneration      HISTORY OF PRESENT ILLNESS: Kaitlyn Ferguson is a 76 y.o. female who presents to the clinic today for:   HPI   3 MOS for DILATE OS, AVASTIN OCT. Pt stated vision has been stable since last visit. Pt denies new floaters and FOL.  Last edited by Silvestre Moment on 02/05/2022 12:56 PM.      Referring physician: Clent Jacks, MD [address] [place],  [place] [id number]  HISTORICAL INFORMATION:   Selected notes from the MEDICAL RECORD NUMBER    Lab Results  Component Value Date   HGBA1C 7.4 (A) 07/30/2021     CURRENT MEDICATIONS: No current outpatient medications on file. (Ophthalmic Drugs)   No current facility-administered medications for this visit. (Ophthalmic Drugs)   Current Outpatient Medications (Other)  Medication Sig   Blood Glucose Monitoring Suppl (ONETOUCH VERIO) w/Device KIT Check blood sugars 2 times daily. Code: E11.9   glucose blood (ONETOUCH VERIO) test strip USE AS DIRECTED TWICE DAILY TO TEST BLOOD SUGAR   insulin degludec (TRESIBA FLEXTOUCH) 100 UNIT/ML FlexTouch Pen Inject 54 Units into the skin daily.   Insulin Pen Needle (B-D UF III MINI PEN NEEDLES) 31G X 5 MM MISC USE TWICE A DAY   Lancet Devices (ONE TOUCH DELICA LANCING DEV) MISC Check blood sugars 2 times daily. Code: E11.9   levothyroxine (SYNTHROID) 88 MCG tablet TAKE 1 TABLET BY MOUTH EVERY DAY BEFORE BREAKFAST   lisinopril-hydrochlorothiazide (ZESTORETIC) 20-12.5 MG tablet Take 2 tablets by mouth daily.   metFORMIN (GLUCOPHAGE-XR) 500 MG 24 hr tablet TAKE 2 TABLET BY MOUTH WITH BREAKFAST AND 1 TABLET BY MOUTH WITH DINNER   Multiple Vitamin (MULTIVITAMIN WITH MINERALS) TABS tablet Take 1 tablet by mouth daily. With Vitamin D3   OZEMPIC, 1 MG/DOSE, 4 MG/3ML SOPN INJECT 1MG INTO THE SKIN ONCE A WEEK   rosuvastatin (CRESTOR) 5 MG tablet Take 1 tablet (5 mg total) by  mouth daily.   No current facility-administered medications for this visit. (Other)      REVIEW OF SYSTEMS: ROS   Negative for: Constitutional, Gastrointestinal, Neurological, Skin, Genitourinary, Musculoskeletal, HENT, Endocrine, Cardiovascular, Eyes, Respiratory, Psychiatric, Allergic/Imm, Heme/Lymph Last edited by Silvestre Moment on 02/05/2022 12:56 PM.       ALLERGIES Allergies  Allergen Reactions   Gabapentin Nausea Only   Vancomycin Itching    Pre-medicated with benadryl and tylenol 1 hr before administration of vanc    PAST MEDICAL HISTORY Past Medical History:  Diagnosis Date   Anatomical narrow angle glaucoma with borderline intraocular pressure, right 02/01/2020   Arthritis    Diabetes mellitus    DJD (degenerative joint disease) 10/07/2008   DJD (degenerative joint disease)    Family history of adverse reaction to anesthesia    MOTHER WAS DIFFICULT TO WAKE UP   Hyperlipidemia    Hypertension    Hypothyroidism    Nausea    INTERMITANT - TAKES PRILOSEC (DENIES REFLUX)   Obesity    Osteomyelitis of ankle ([place])    Complications of Right ankle injury   Past Surgical History:  Procedure Laterality Date   ABDOMINAL HYSTERECTOMY     ANKLE FUSION  02/1996   Right ankle tib/talar   BACK SURGERY  1984   BREAST EXCISIONAL BIOPSY Right    CARPAL TUNNEL RELEASE  1993   Right arm   CHOLECYSTECTOMY  EYE SURGERY Left    NECK MASS EXCISION  12/29/2001   right eye laser     TONSILLECTOMY     Childhood   TOTAL KNEE ARTHROPLASTY Left 12/21/2015   Procedure: LEFT TOTAL KNEE ARTHROPLASTY WITH COMPLEX COMPUTER NAVIGATION ;  Surgeon: Rod Can, MD;  Location: [facility];  Service: Orthopedics;  Laterality: Left;   TOTAL SHOULDER ARTHROPLASTY Right 07/11/2016   Procedure: RIGHT TOTAL SHOULDER ARTHROPLASTY;  Surgeon: Justice Britain, MD;  Location: [place];  Service: Orthopedics;  Laterality: Right;   TOTAL SHOULDER ARTHROPLASTY Left 11/04/2019   Procedure: TOTAL SHOULDER  ARTHROPLASTY;  Surgeon: Justice Britain, MD;  Location: [facility];  Service: Orthopedics;  Laterality: Left;  192mn    FAMILY HISTORY Family History  Problem Relation Age of Onset   Stroke Mother    Heart failure Mother    Multiple myeloma Mother        Deceased at 768due to same   Heart attack Father        First MI- early 559's  Diabetes Brother    Lung cancer Daughter    Diabetes Son    Heart attack Son    Obesity Son    Thyroid disease Son    Thyroid disease Son    Thyroid disease Son    Breast cancer Paternal Aunt     SOCIAL HISTORY Social History   Tobacco Use   Smoking status: Never   Smokeless tobacco: Never  Vaping Use   Vaping Use: Never used  Substance Use Topics   Alcohol use: No    Alcohol/week: 0.0 standard drinks of alcohol   Drug use: No         OPHTHALMIC EXAM:  Base Eye Exam     Visual Acuity (ETDRS)       Right Left   Dist [place] 20/80 -2 CF at 3'   Dist ph [place] 20/30 -2          Tonometry (Tonopen, 1:01 PM)       Right Left   Pressure 10 10         Pupils       Pupils APD   Right PERRL None   Left PERRL None         Visual Fields       Left Right    Full Full         Extraocular Movement       Right Left    Full Full         Neuro/Psych     Oriented x3: Yes   Mood/Affect: Normal         Dilation     Left eye: 2.5% Phenylephrine, 1.0% Mydriacyl @ 1:01 PM           Slit Lamp and Fundus Exam     External Exam       Right Left   External Normal Normal         Slit Lamp Exam       Right Left   Lids/Lashes Normal Normal   Conjunctiva/Sclera White and quiet White and quiet   Cornea Clear Clear   Anterior Chamber Deep and quiet Deep and quiet   Iris Round and reactive Round and reactive   Lens Posterior chamber intraocular lens, Centered posterior chamber intraocular lens Posterior chamber intraocular lens, Centered posterior chamber intraocular lens   Anterior Vitreous Normal Normal          Fundus Exam  Right Left   Posterior Vitreous  Posterior vitreous detachment, Central vitreous floaters   Disc  Normal   C/D Ratio  0.35   Macula  Retinal pigment epithelial mottling, Disciform scar in a foveal location, no SR heme temporal to the fovea   Vessels  no DR   Periphery  Normal            IMAGING AND PROCEDURES  Imaging and Procedures for 02/05/22  OCT, Retina - OU - Both Eyes       Right Eye Quality was borderline. Scan locations included subfoveal. Central Foveal Thickness: 304. Progression has been stable. Findings include abnormal foveal contour, epiretinal membrane.   Left Eye Quality was good. Scan locations included subfoveal. Central Foveal Thickness: 291. Progression has improved. Findings include abnormal foveal contour, subretinal hyper-reflective material, cystoid macular edema, disciform scar.   Notes Subretinal scarring, disciform scar, slight increase subretinal fluid, today at 13 -week follow-up OS.  We will repeat injection today and may maintain interval of the evaluation to 13 weeks  OD with irregular epiretinal membrane foveal distortion yet stable over time with no outer retinal changes.  We will continue to monitor and observe     Intravitreal Injection, Pharmacologic Agent - OS - Left Eye       Time Out 02/05/2022. 1:31 PM. Confirmed correct patient, procedure, site, and patient consented.   Anesthesia Topical anesthesia was used. Anesthetic medications included Lidocaine 4%.   Procedure Preparation included 5% betadine to ocular surface, 10% betadine to eyelids, Tobramycin 0.3%. A 30 gauge needle was used.   Injection: 2.5 mg bevacizumab 2.5 MG/0.1ML   Route: Intravitreal, Site: Left Eye   NDC: [phone], Lot: [id number], Expiration date: 03/24/2022   Post-op Post injection exam found visual acuity of at least counting fingers. The patient tolerated the procedure well. There were no complications. The patient received  written and verbal post procedure care education. Post injection medications included ocuflox.              ASSESSMENT/PLAN:  Exudative age-related macular degeneration of left eye with active choroidal neovascularization (HCC) OS today at 13 weeks post injection intravitreal Avastin and found to have stable subfoveal disciform scar, with no intraretinal fluid increases.  Repeat injection today of Avastin to maintain and follow-up evaluation next in 16 weeks  Right epiretinal membrane Severe topographic distortion, likely tangential traction thinning the center of the fovea yet still stable acuity.  We will watch carefully this monocular patient  Macular pucker, left eye Minor OS no impact on acuity temporal aspect of fovea     ICD-10-CM   1. Exudative age-related macular degeneration of left eye with active choroidal neovascularization (HCC)  H35.3221 OCT, Retina - OU - Both Eyes    Intravitreal Injection, Pharmacologic Agent - OS - Left Eye    bevacizumab (AVASTIN) SOSY 2.5 mg    2. Right epiretinal membrane  H35.371     3. Macular pucker, left eye  H35.372       1.  OD with stable acuity with severe epiretinal membrane continue to observe  2.  OS with longstanding subfoveal CNVM now white disciform scar, no signs of enlargement of scotoma on Avastin currently at 13 weeks.  Repeat injection today to maintain and follow-up next in 16 weeks  3.  Ophthalmic Meds Ordered this visit:  Meds ordered this encounter  Medications   bevacizumab (AVASTIN) SOSY 2.5 mg       Return in about 4 months (around 06/07/2022) for  DILATE OU, AVASTIN OCT, OS.  There are no Patient Instructions on file for this visit.   Explained the diagnoses, plan, and follow up with the patient and they expressed understanding.  Patient expressed understanding of the importance of proper follow up care.   Clent Demark Noelle Sease M.D. Diseases & Surgery of the Retina and Vitreous [facility] [place] 02/05/22     Abbreviations: M myopia (nearsighted); A astigmatism; H hyperopia (farsighted); P presbyopia; Mrx spectacle prescription;  CTL contact lenses; OD right eye; OS left eye; OU both eyes  XT exotropia; ET esotropia; PEK punctate epithelial keratitis; PEE punctate epithelial erosions; DES dry eye syndrome; MGD meibomian gland dysfunction; ATs artificial tears; PFAT's preservative free artificial tears; [place] nuclear sclerotic cataract; PSC posterior subcapsular cataract; ERM epi-retinal membrane; PVD posterior vitreous detachment; RD retinal detachment; DM diabetes mellitus; DR diabetic retinopathy; NPDR non-proliferative diabetic retinopathy; PDR proliferative diabetic retinopathy; CSME clinically significant macular edema; DME diabetic macular edema; dbh dot blot hemorrhages; CWS cotton wool spot; POAG primary open angle glaucoma; C/D cup-to-disc ratio; HVF humphrey visual field; GVF goldmann visual field; OCT optical coherence tomography; IOP intraocular pressure; BRVO Branch retinal vein occlusion; CRVO central retinal vein occlusion; CRAO central retinal artery occlusion; BRAO branch retinal artery occlusion; RT retinal tear; SB scleral buckle; PPV pars plana vitrectomy; VH Vitreous hemorrhage; PRP panretinal laser photocoagulation; IVK intravitreal kenalog; VMT vitreomacular traction; MH Macular hole;  NVD neovascularization of the disc; NVE neovascularization elsewhere; AREDS age related eye disease study; ARMD age related macular degeneration; POAG primary open angle glaucoma; EBMD epithelial/anterior basement membrane dystrophy; ACIOL anterior chamber intraocular lens; IOL intraocular lens; PCIOL posterior chamber intraocular lens; Phaco/IOL phacoemulsification with intraocular lens placement; [place] photorefractive keratectomy; LASIK laser assisted in situ keratomileusis; HTN hypertension; DM diabetes mellitus; COPD chronic obstructive pulmonary disease

## 2022-03-16 IMAGING — DX DG LUMBAR SPINE COMPLETE 4+V
5 series · 5 of 5 positions shown · non-contrast
Comparison: None.

CLINICAL DATA: Right-sided lumbar pain

EXAM:
LUMBAR SPINE - COMPLETE 4+ VIEW

[dg lumbar spine complete 4 +v (1 of 5)]
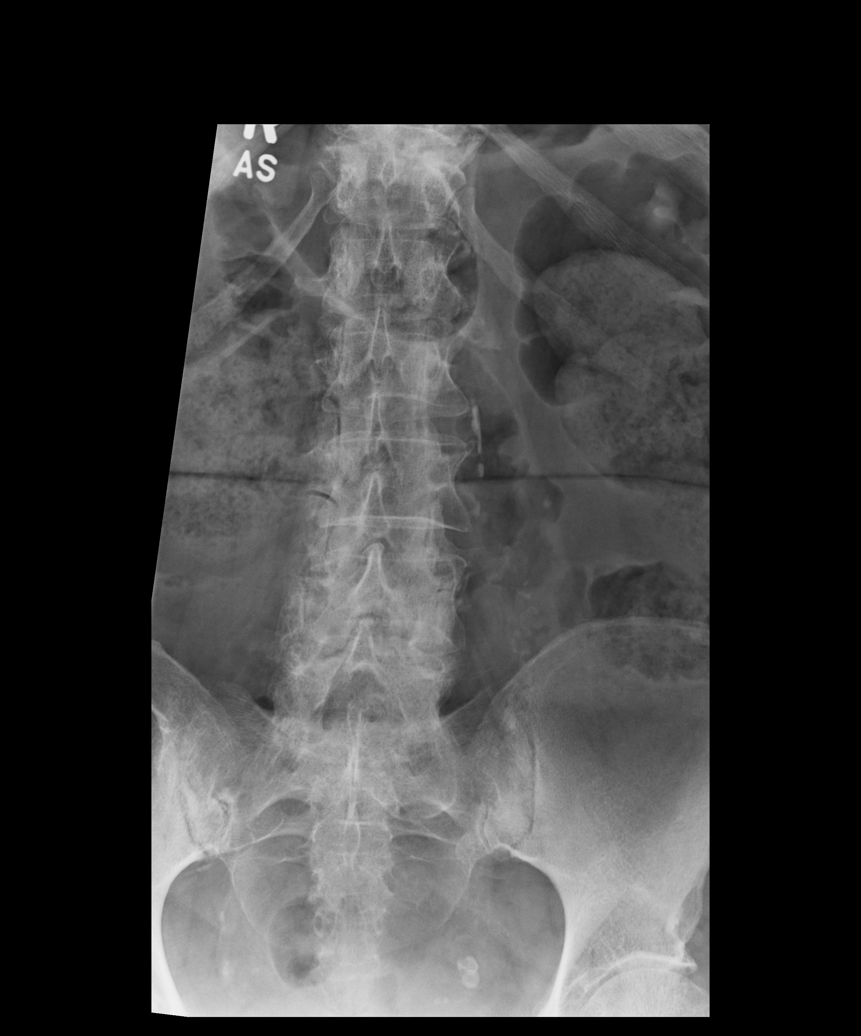

[dg lumbar spine complete 4 +v (2 of 5)]
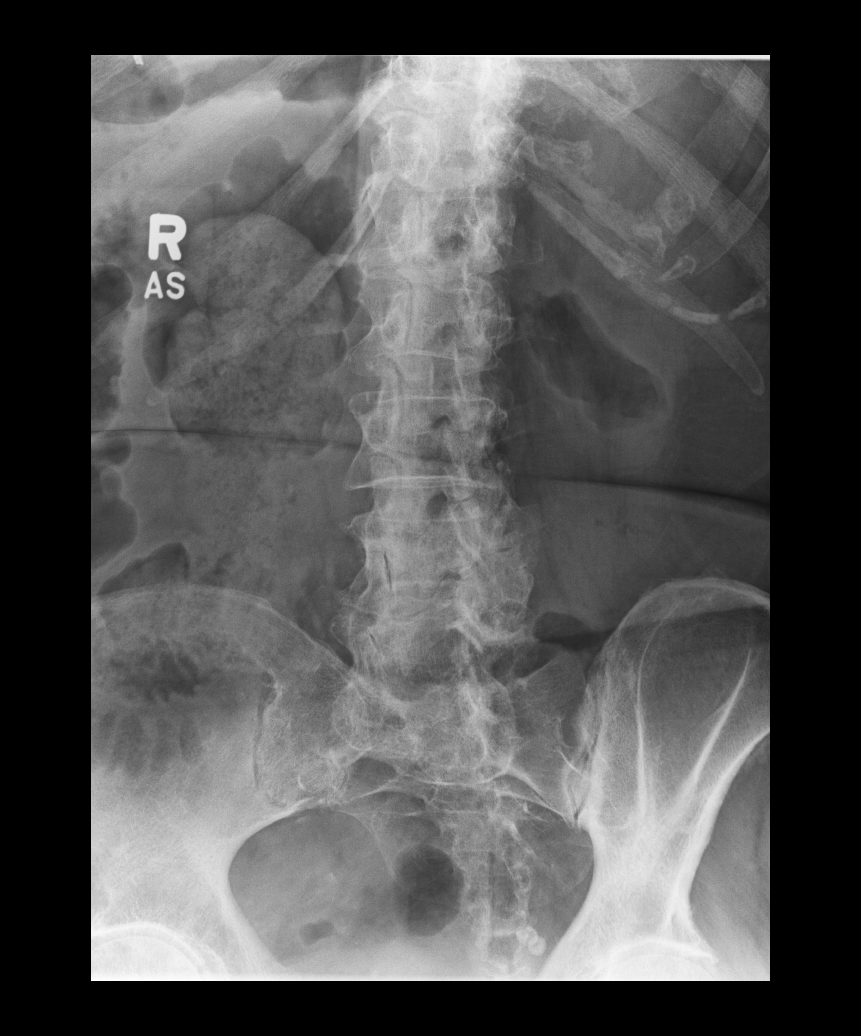

[dg lumbar spine complete 4 +v (3 of 5)]
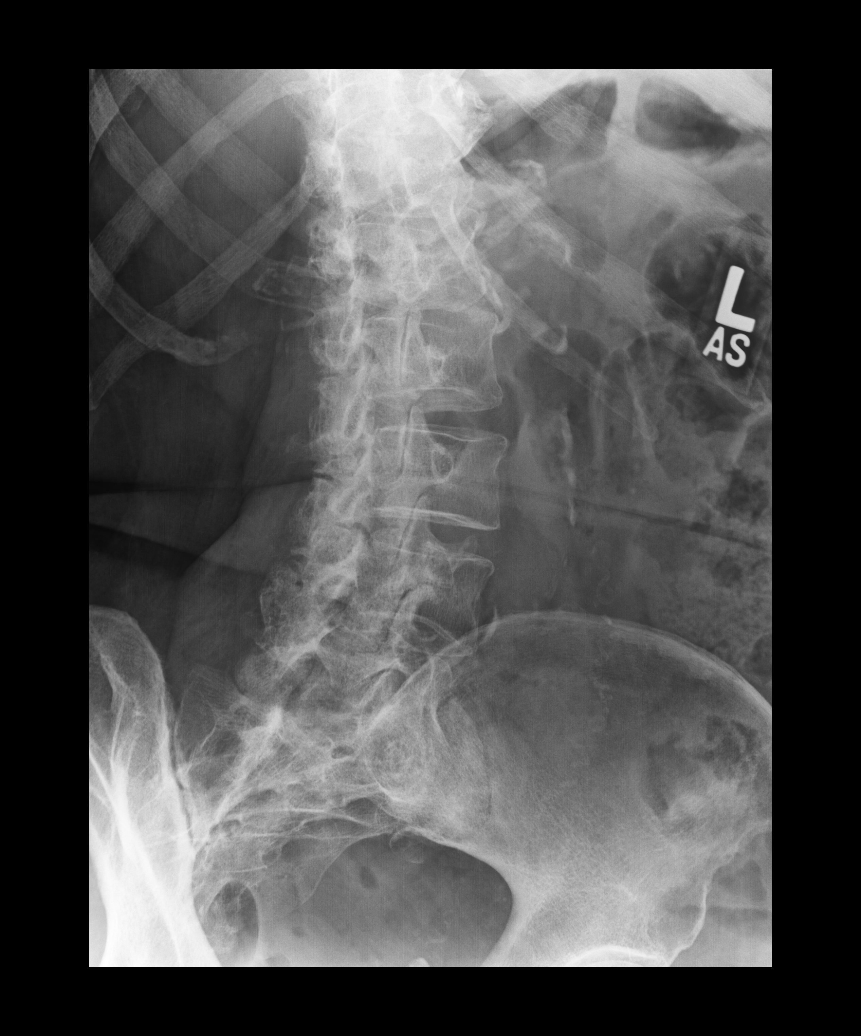

[dg lumbar spine complete 4 +v (4 of 5)]
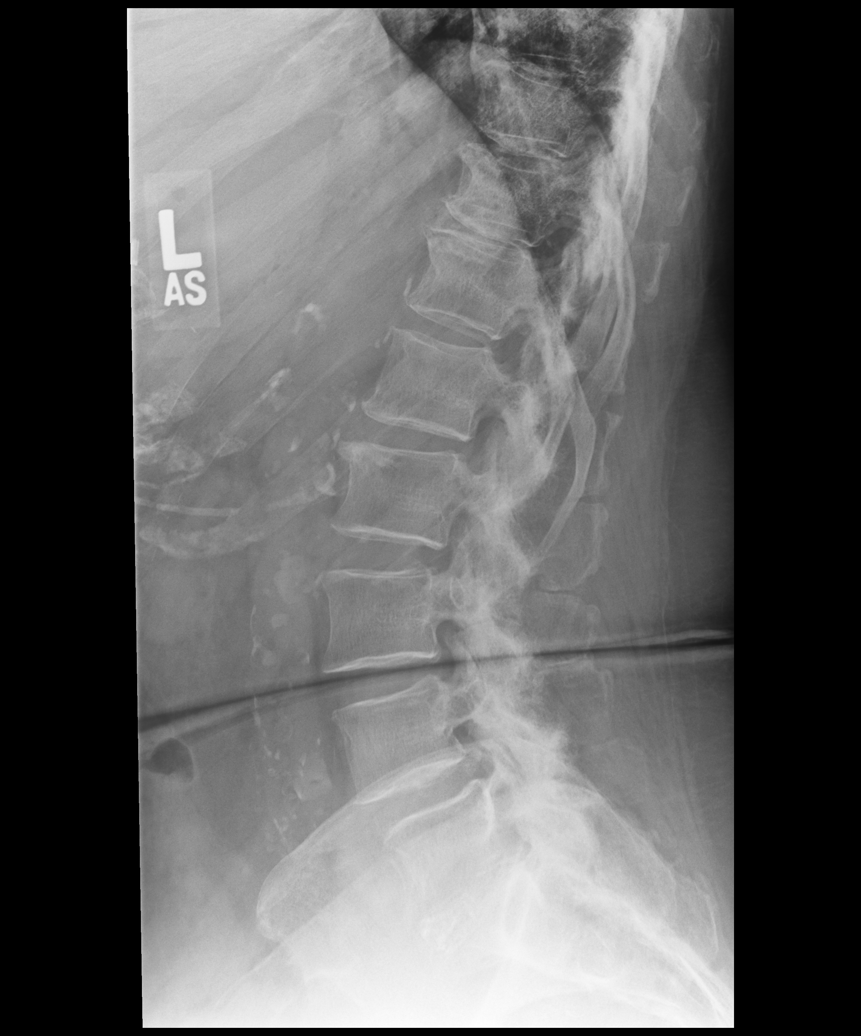

[dg lumbar spine complete 4 +v (5 of 5)]
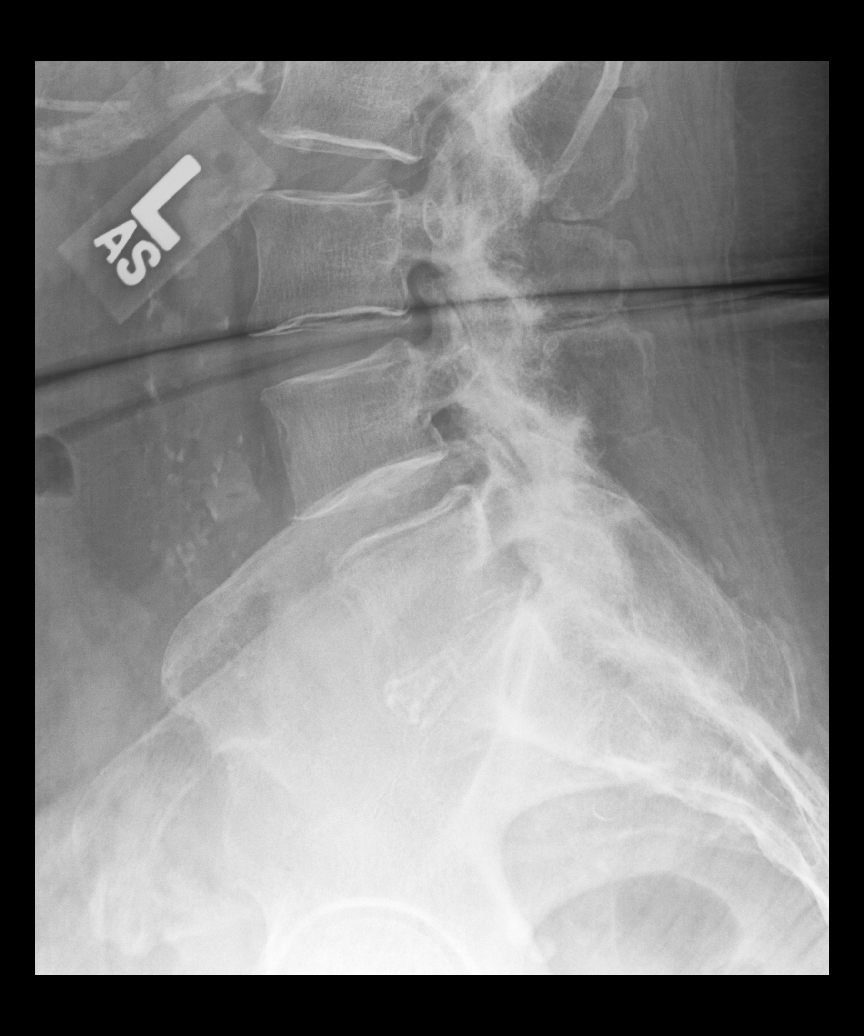

[5 of 5 positions shown; findings below may reference images not displayed]

FINDINGS: Slight scoliosis in the lumbar spine.  Mild retrolisthesis L2-3.

Negative for fracture or pars defect. Significant facet degeneration
at L3-4, L4-5, L5-S1. Mild disc degeneration and disc calcification
L5-S1.
IMPRESSION: Lumbar degenerative changes most prominent in the facet joints. No
acute abnormality.

## 2022-03-27 ENCOUNTER — Other Ambulatory Visit: Payer: Self-pay | Admitting: Family Medicine

## 2022-04-25 ENCOUNTER — Other Ambulatory Visit: Payer: Self-pay | Admitting: Family Medicine

## 2022-05-02 ENCOUNTER — Other Ambulatory Visit: Payer: Self-pay | Admitting: Infectious Diseases

## 2022-05-02 DIAGNOSIS — Z1231 Encounter for screening mammogram for malignant neoplasm of breast: Secondary | ICD-10-CM

## 2022-05-12 ENCOUNTER — Other Ambulatory Visit: Payer: Self-pay | Admitting: Family Medicine

## 2022-05-12 DIAGNOSIS — E038 Other specified hypothyroidism: Secondary | ICD-10-CM

## 2022-05-13 ENCOUNTER — Encounter (INDEPENDENT_AMBULATORY_CARE_PROVIDER_SITE_OTHER): Payer: Self-pay

## 2022-05-28 DIAGNOSIS — Z794 Long term (current) use of insulin: Secondary | ICD-10-CM | POA: Diagnosis not present

## 2022-05-28 DIAGNOSIS — R1900 Intra-abdominal and pelvic swelling, mass and lump, unspecified site: Secondary | ICD-10-CM | POA: Diagnosis not present

## 2022-05-28 DIAGNOSIS — E1142 Type 2 diabetes mellitus with diabetic polyneuropathy: Secondary | ICD-10-CM | POA: Diagnosis not present

## 2022-05-28 DIAGNOSIS — Z0289 Encounter for other administrative examinations: Secondary | ICD-10-CM | POA: Diagnosis not present

## 2022-06-10 ENCOUNTER — Encounter (INDEPENDENT_AMBULATORY_CARE_PROVIDER_SITE_OTHER): Payer: Medicare Other | Admitting: Ophthalmology

## 2022-06-11 ENCOUNTER — Ambulatory Visit
Admission: RE | Admit: 2022-06-11 | Discharge: 2022-06-11 | Disposition: A | Discharge status: Home / Self Care | Payer: Medicare Other | Source: Ambulatory Visit | Attending: Infectious Diseases | Admitting: Infectious Diseases

## 2022-06-11 DIAGNOSIS — Z1231 Encounter for screening mammogram for malignant neoplasm of breast: Secondary | ICD-10-CM

## 2022-06-16 ENCOUNTER — Encounter (HOSPITAL_COMMUNITY): Payer: Self-pay | Admitting: Emergency Medicine

## 2022-06-16 ENCOUNTER — Emergency Department (HOSPITAL_COMMUNITY): Payer: Medicare Other

## 2022-06-16 ENCOUNTER — Emergency Department (HOSPITAL_COMMUNITY)
Admission: EM | Admit: 2022-06-16 | Discharge: 2022-06-16 | Disposition: A | Discharge status: Home / Self Care | Payer: Medicare Other | Attending: Emergency Medicine | Admitting: Emergency Medicine

## 2022-06-16 ENCOUNTER — Other Ambulatory Visit: Payer: Self-pay

## 2022-06-16 DIAGNOSIS — E1165 Type 2 diabetes mellitus with hyperglycemia: Secondary | ICD-10-CM | POA: Insufficient documentation

## 2022-06-16 DIAGNOSIS — R531 Weakness: Secondary | ICD-10-CM | POA: Diagnosis not present

## 2022-06-16 DIAGNOSIS — R739 Hyperglycemia, unspecified: Secondary | ICD-10-CM | POA: Diagnosis not present

## 2022-06-16 DIAGNOSIS — Z794 Long term (current) use of insulin: Secondary | ICD-10-CM | POA: Insufficient documentation

## 2022-06-16 DIAGNOSIS — R6889 Other general symptoms and signs: Secondary | ICD-10-CM | POA: Diagnosis not present

## 2022-06-16 DIAGNOSIS — Z1152 Encounter for screening for COVID-19: Secondary | ICD-10-CM | POA: Insufficient documentation

## 2022-06-16 DIAGNOSIS — R5383 Other fatigue: Secondary | ICD-10-CM | POA: Diagnosis not present

## 2022-06-16 DIAGNOSIS — R1110 Vomiting, unspecified: Secondary | ICD-10-CM

## 2022-06-16 DIAGNOSIS — Z743 Need for continuous supervision: Secondary | ICD-10-CM | POA: Diagnosis not present

## 2022-06-16 DIAGNOSIS — R059 Cough, unspecified: Secondary | ICD-10-CM | POA: Diagnosis not present

## 2022-06-16 DIAGNOSIS — E860 Dehydration: Secondary | ICD-10-CM | POA: Diagnosis not present

## 2022-06-16 DIAGNOSIS — Z20822 Contact with and (suspected) exposure to covid-19: Secondary | ICD-10-CM | POA: Diagnosis not present

## 2022-06-16 DIAGNOSIS — F32A Depression, unspecified: Secondary | ICD-10-CM | POA: Diagnosis not present

## 2022-06-16 DIAGNOSIS — R112 Nausea with vomiting, unspecified: Secondary | ICD-10-CM | POA: Diagnosis not present

## 2022-06-16 DIAGNOSIS — R197 Diarrhea, unspecified: Secondary | ICD-10-CM | POA: Diagnosis not present

## 2022-06-16 DIAGNOSIS — R110 Nausea: Secondary | ICD-10-CM | POA: Diagnosis not present

## 2022-06-16 LAB — CBC WITH DIFFERENTIAL/PLATELET
Abs Immature Granulocytes: 0.03 10*3/uL (ref 0.00–0.07)
Basophils Absolute: 0 10*3/uL (ref 0.0–0.1)
Basophils Relative: 0 %
Eosinophils Absolute: 0 10*3/uL (ref 0.0–0.5)
Eosinophils Relative: 0 %
HCT: 41.8 % (ref 36.0–46.0)
Hemoglobin: 13.6 g/dL (ref 12.0–15.0)
Immature Granulocytes: 0 %
Lymphocytes Relative: 10 %
Lymphs Abs: 1 10*3/uL (ref 0.7–4.0)
MCH: 27.1 pg (ref 26.0–34.0)
MCHC: 32.5 g/dL (ref 30.0–36.0)
MCV: 83.3 fL (ref 80.0–100.0)
Monocytes Absolute: 0.4 10*3/uL (ref 0.1–1.0)
Monocytes Relative: 4 %
Neutro Abs: 8.4 10*3/uL — ABNORMAL HIGH (ref 1.7–7.7)
Neutrophils Relative %: 86 %
Platelets: 226 10*3/uL (ref 150–400)
RBC: 5.02 MIL/uL (ref 3.87–5.11)
RDW: 14.6 % (ref 11.5–15.5)
WBC: 9.8 10*3/uL (ref 4.0–10.5)
nRBC: 0 % (ref 0.0–0.2)

## 2022-06-16 LAB — COMPREHENSIVE METABOLIC PANEL
ALT: 16 U/L (ref 0–44)
AST: 23 U/L (ref 15–41)
Albumin: 3.7 g/dL (ref 3.5–5.0)
Alkaline Phosphatase: 60 U/L (ref 38–126)
Anion gap: 14 (ref 5–15)
BUN: 15 mg/dL (ref 8–23)
CO2: 26 mmol/L (ref 22–32)
Calcium: 9.6 mg/dL (ref 8.9–10.3)
Chloride: 100 mmol/L (ref 98–111)
Creatinine, Ser: 1.09 mg/dL — ABNORMAL HIGH (ref 0.44–1.00)
GFR, Estimated: 53 mL/min — ABNORMAL LOW (ref >60)
Glucose, Bld: 241 mg/dL — ABNORMAL HIGH (ref 70–99)
Potassium: 3.8 mmol/L (ref 3.5–5.1)
Sodium: 140 mmol/L (ref 135–145)
Total Bilirubin: 0.5 mg/dL (ref 0.3–1.2)
Total Protein: 6.8 g/dL (ref 6.5–8.1)

## 2022-06-16 LAB — RESP PANEL BY RT-PCR (FLU A&B, COVID) ARPGX2
Influenza A by PCR: NEGATIVE
Influenza B by PCR: NEGATIVE
SARS Coronavirus 2 by RT PCR: NEGATIVE

## 2022-06-16 LAB — TROPONIN I (HIGH SENSITIVITY): Troponin I (High Sensitivity): 18 ng/L — ABNORMAL HIGH (ref <18)

## 2022-06-16 MED ORDER — ONDANSETRON 4 MG PO TBDP: ORAL_TABLET | ORAL | 0 refills | Status: AC

## 2022-06-16 MED ORDER — SODIUM CHLORIDE 0.9 % IV BOLUS
1000.0000 mL | Freq: Once | INTRAVENOUS | Status: AC
  Administered 2022-06-16: 1000 mL via INTRAVENOUS

## 2022-06-16 NOTE — Discharge Instructions (Addendum)
Use Zofran as needed for nausea and vomiting. Return for chest pain, abdominal pain, passing out or new concerns.

## 2022-06-16 NOTE — ED Provider Notes (Signed)
[facility] EMERGENCY DEPARTMENT Provider Note   CSN: [id number] Arrival date & time: 06/16/22  2040     History  Chief Complaint  Patient presents with   Nausea /Hyperglycemia    Kaitlyn Ferguson is a 76 y.o. female.  Patient with history of type 2 diabetes on insulin, high blood pressure, obesity, lipids presents with recurrent nausea vomiting since yesterday with diarrhea nonbloody nonbilious.  No one else sick near her, no recent travel, no current antibiotics.  Last medication change approximate 3 months ago.  No abdominal pain or tenderness, no back pain or urinary symptoms.       Home Medications Prior to Admission medications   Medication Sig Start Date End Date Taking? Authorizing Provider  ondansetron (ZOFRAN-ODT) 4 MG disintegrating tablet 52m ODT q4 hours prn nausea/vomit 06/16/22  Yes ZElnora Morrison MD  Blood Glucose Monitoring Suppl (ONETOUCH VERIO) w/Device KIT Check blood sugars 2 times daily. Code: E11.9 01/18/21   HZenia Resides MD  glucose blood (ONETOUCH VERIO) test strip USE AS DIRECTED TWICE DAILY TO TEST BLOOD SUGAR 09/21/21   HZenia Resides MD  insulin degludec (TRESIBA FLEXTOUCH) 100 UNIT/ML FlexTouch Pen Inject 54 Units into the skin daily. 01/14/22   HZenia Resides MD  Insulin Pen Needle (B-D UF III MINI PEN NEEDLES) 31G X 5 MM MISC USE TWICE A DAY 01/17/22   HZenia Resides MD  Lancet Devices (ONE TOUCH DELICA LANCING DEV) MISC Check blood sugars 2 times daily. Code: E11.9 09/15/19   HZenia Resides MD  Lancets ([facility] CenterDELICA PLUS [id number] MISC USE TO TEST BLOOD SUGAR TWICE DAILY 03/27/22   HZenia Resides MD  levothyroxine (SYNTHROID) 88 MCG tablet TAKE 1 TABLET BY MOUTH EVERY DAY BEFORE BREAKFAST 05/13/22   HZenia Resides MD  lisinopril-hydrochlorothiazide (ZESTORETIC) 20-12.5 MG tablet Take 2 tablets by mouth daily. 07/30/21   HZenia Resides MD  metFORMIN (GLUCOPHAGE-XR) 500 MG 24 hr tablet TAKE 2  TABLET BY MOUTH WITH BREAKFAST AND 1 TABLET BY MOUTH WITH DINNER 11/19/21   HZenia Resides MD  Multiple Vitamin (MULTIVITAMIN WITH MINERALS) TABS tablet Take 1 tablet by mouth daily. With Vitamin D3    [provider]  OZEMPIC, 1 MG/DOSE, 4 MG/3ML SOPN INJECT 1MG INTO THE SKIN ONCE A WEEK 07/23/21   Hensel, WJamal Collin MD  rosuvastatin (CRESTOR) 5 MG tablet Take 1 tablet (5 mg total) by mouth daily. 05/30/21   HZenia Resides MD  Insulin Syringe-Needle U-100 31G X 5/16" 1 ML MISC Use with insulin as directed. 08/20/12 09/07/13  OCarolin Guernsey MD      Allergies    Gabapentin and Vancomycin    Review of Systems   Review of Systems  Constitutional:  Positive for fatigue. Negative for chills and fever.  HENT:  Negative for congestion.   Eyes:  Negative for visual disturbance.  Respiratory:  Negative for shortness of breath.   Cardiovascular:  Negative for chest pain.  Gastrointestinal:  Positive for diarrhea, nausea and vomiting. Negative for abdominal pain.  Genitourinary:  Negative for dysuria and flank pain.  Musculoskeletal:  Negative for back pain, neck pain and neck stiffness.  Skin:  Negative for rash.  Neurological:  Negative for light-headedness and headaches.    Physical Exam Updated Vital Signs BP (!) 141/68   Pulse 66   Resp 13   SpO2 96%  Physical Exam Vitals and nursing note reviewed.  Constitutional:      General:  She is not in acute distress.    Appearance: She is well-developed.  HENT:     Head: Normocephalic and atraumatic.     Mouth/Throat:     Mouth: Mucous membranes are dry.  Eyes:     General:        Right eye: No discharge.        Left eye: No discharge.     Conjunctiva/sclera: Conjunctivae normal.  Neck:     Trachea: No tracheal deviation.  Cardiovascular:     Rate and Rhythm: Normal rate and regular rhythm.  Pulmonary:     Effort: Pulmonary effort is normal.     Breath sounds: Normal breath sounds.  Abdominal:     General: There  is no distension.     Palpations: Abdomen is soft.     Tenderness: There is no abdominal tenderness. There is no guarding.  Musculoskeletal:     Cervical back: Normal range of motion and neck supple. No rigidity.  Skin:    General: Skin is warm.     Capillary Refill: Capillary refill takes less than 2 seconds.     Findings: No rash.  Neurological:     General: No focal deficit present.     Mental Status: She is alert.     Cranial Nerves: No cranial nerve deficit.  Psychiatric:        Mood and Affect: Mood normal.     ED Results / Procedures / Treatments   Labs (all labs ordered are listed, but only abnormal results are displayed) Labs Reviewed  COMPREHENSIVE METABOLIC PANEL - Abnormal; Notable for the following components:      Result Value   Glucose, Bld 241 (*)    Creatinine, Ser 1.09 (*)    GFR, Estimated 53 (*)    All other components within normal limits  CBC WITH DIFFERENTIAL/PLATELET - Abnormal; Notable for the following components:   Neutro Abs 8.4 (*)    All other components within normal limits  TROPONIN I (HIGH SENSITIVITY) - Abnormal; Notable for the following components:   Troponin I (High Sensitivity) 18 (*)    All other components within normal limits  RESP PANEL BY RT-PCR (FLU A&B, COVID) [id number]  URINALYSIS, ROUTINE W REFLEX MICROSCOPIC  TROPONIN I (HIGH SENSITIVITY)    EKG EKG Interpretation  Date/Time:  Sunday June 16 2022 20:48:06 EDT Ventricular Rate:  83 PR Interval:  207 QRS Duration: 101 QT Interval:  379 QTC Calculation: 446 R Axis:   84 Text Interpretation: Sinus arrhythmia Low voltage, precordial leads Probable anteroseptal infarct, old Borderline repolarization abnormality Confirmed by Elnora Morrison [id number]) on 06/16/2022 9:40:52 PM  Radiology DG Chest Portable 1 View  Result Date: 06/16/2022 CLINICAL DATA:  Cough.  Nausea.  Generalized weakness and fatigue. EXAM: PORTABLE CHEST 1 VIEW COMPARISON:  09/16/2013 FINDINGS: Shallow  inspiration. Heart size and pulmonary vascularity are normal for technique. Probable linear atelectasis in the lung bases. No pleural effusions. No pneumothorax. Mediastinal contours appear intact. Calcification of the aorta. Postoperative changes in the shoulders. IMPRESSION: Shallow inspiration with linear atelectasis in the lung bases. No focal consolidation. Electronically Signed   By: Lucienne Capers M.D.   On: 06/16/2022 22:07    Procedures Procedures    Medications Ordered in ED Medications  sodium chloride 0.9 % bolus 1,000 mL (0 mLs Intravenous Stopped 06/16/22 2223)    ED Course/ Medical Decision Making/ A&P  Medical Decision Making Amount and/or Complexity of Data Reviewed Labs: ordered. Radiology: ordered.  Risk Prescription drug management.   Patient presents with nausea vomiting diarrhea and fatigue since yesterday.  Differential includes gastroenteritis viral/toxin mediated, urine infection, diabetes related/hyperglycemia/acidosis, renal failure/dehydration, atypical cardiac, other.  Plan for screening blood work, single troponin since symptoms since yesterday, EKG, IV fluids and patient already received nausea meds by EMS team on route.  Discussed this with patient family was in the room are comfortable this plan.  Patient is no abdominal tenderness or pain to suggest acute abdominal pathology at this time.  Patient well-appearing having no signs or symptoms on reassessment, smiling, no chest pain or shortness of breath.  Highly doubt ACS troponin 18, canceled repeat troponin as will not change management discussed with lab. Zofran for home and strict reasons to return given.  Patient can follow-up with a primary doctor for uncontrolled diabetes.  No signs of acidosis however patient has glucose 241 and creatinine 1.09, mild renal insufficiency likely secondary to dehydration.         Final Clinical Impression(s) / ED Diagnoses Final  diagnoses:  Vomiting in adult  Other fatigue  Nausea vomiting and diarrhea  Hyperglycemia due to diabetes mellitus ([place])    Rx / DC Orders ED Discharge Orders          Ordered    ondansetron (ZOFRAN-ODT) 4 MG disintegrating tablet        06/16/22 2319              Elnora Morrison, MD 06/16/22 2323

## 2022-06-16 NOTE — ED Triage Notes (Signed)
Patient arrived with EMS from home reports nausea this afternoon with generalized weakness/fatigue , she received Zofran 4 mg IV and ASA 324 mg by EMS with relief . CBG= 338 by EMS .

## 2022-06-17 ENCOUNTER — Emergency Department (HOSPITAL_BASED_OUTPATIENT_CLINIC_OR_DEPARTMENT_OTHER)
Admission: EM | Admit: 2022-06-17 | Discharge: 2022-06-17 | Disposition: A | Discharge status: Home / Self Care | Payer: Medicare Other | Attending: Emergency Medicine | Admitting: Emergency Medicine

## 2022-06-17 ENCOUNTER — Emergency Department (HOSPITAL_BASED_OUTPATIENT_CLINIC_OR_DEPARTMENT_OTHER): Payer: Medicare Other

## 2022-06-17 ENCOUNTER — Encounter (HOSPITAL_BASED_OUTPATIENT_CLINIC_OR_DEPARTMENT_OTHER): Payer: Self-pay

## 2022-06-17 DIAGNOSIS — E876 Hypokalemia: Secondary | ICD-10-CM | POA: Insufficient documentation

## 2022-06-17 DIAGNOSIS — Z79899 Other long term (current) drug therapy: Secondary | ICD-10-CM | POA: Insufficient documentation

## 2022-06-17 DIAGNOSIS — Z794 Long term (current) use of insulin: Secondary | ICD-10-CM | POA: Diagnosis not present

## 2022-06-17 DIAGNOSIS — Z7984 Long term (current) use of oral hypoglycemic drugs: Secondary | ICD-10-CM | POA: Insufficient documentation

## 2022-06-17 DIAGNOSIS — R6889 Other general symptoms and signs: Secondary | ICD-10-CM | POA: Diagnosis not present

## 2022-06-17 DIAGNOSIS — R5383 Other fatigue: Secondary | ICD-10-CM | POA: Insufficient documentation

## 2022-06-17 DIAGNOSIS — R519 Headache, unspecified: Secondary | ICD-10-CM | POA: Insufficient documentation

## 2022-06-17 DIAGNOSIS — E119 Type 2 diabetes mellitus without complications: Secondary | ICD-10-CM | POA: Diagnosis not present

## 2022-06-17 DIAGNOSIS — R110 Nausea: Secondary | ICD-10-CM | POA: Diagnosis not present

## 2022-06-17 DIAGNOSIS — I10 Essential (primary) hypertension: Secondary | ICD-10-CM | POA: Insufficient documentation

## 2022-06-17 DIAGNOSIS — Z743 Need for continuous supervision: Secondary | ICD-10-CM | POA: Diagnosis not present

## 2022-06-17 DIAGNOSIS — R109 Unspecified abdominal pain: Secondary | ICD-10-CM | POA: Diagnosis not present

## 2022-06-17 DIAGNOSIS — R197 Diarrhea, unspecified: Secondary | ICD-10-CM | POA: Diagnosis not present

## 2022-06-17 DIAGNOSIS — R404 Transient alteration of awareness: Secondary | ICD-10-CM | POA: Diagnosis not present

## 2022-06-17 DIAGNOSIS — E039 Hypothyroidism, unspecified: Secondary | ICD-10-CM | POA: Insufficient documentation

## 2022-06-17 DIAGNOSIS — R531 Weakness: Secondary | ICD-10-CM | POA: Insufficient documentation

## 2022-06-17 LAB — COMPREHENSIVE METABOLIC PANEL
ALT: 15 U/L (ref 0–44)
AST: 21 U/L (ref 15–41)
Albumin: 3.6 g/dL (ref 3.5–5.0)
Alkaline Phosphatase: 64 U/L (ref 38–126)
Anion gap: 7 (ref 5–15)
BUN: 12 mg/dL (ref 8–23)
CO2: 25 mmol/L (ref 22–32)
Calcium: 9.1 mg/dL (ref 8.9–10.3)
Chloride: 103 mmol/L (ref 98–111)
Creatinine, Ser: 0.83 mg/dL (ref 0.44–1.00)
GFR, Estimated: >60 mL/min (ref >60)
Glucose, Bld: 152 mg/dL — ABNORMAL HIGH (ref 70–99)
Potassium: 3.2 mmol/L — ABNORMAL LOW (ref 3.5–5.1)
Sodium: 135 mmol/L (ref 135–145)
Total Bilirubin: 0.6 mg/dL (ref 0.3–1.2)
Total Protein: 6.7 g/dL (ref 6.5–8.1)

## 2022-06-17 LAB — URINALYSIS, ROUTINE W REFLEX MICROSCOPIC
Bilirubin Urine: NEGATIVE
Glucose, UA: NEGATIVE mg/dL
Ketones, ur: NEGATIVE mg/dL
Nitrite: NEGATIVE
Protein, ur: 100 mg/dL — AB
Specific Gravity, Urine: 1.02 (ref 1.005–1.030)
pH: 5.5 (ref 5.0–8.0)

## 2022-06-17 LAB — CBC WITH DIFFERENTIAL/PLATELET
Abs Immature Granulocytes: 0.03 10*3/uL (ref 0.00–0.07)
Basophils Absolute: 0 10*3/uL (ref 0.0–0.1)
Basophils Relative: 0 %
Eosinophils Absolute: 0 10*3/uL (ref 0.0–0.5)
Eosinophils Relative: 0 %
HCT: 40.7 % (ref 36.0–46.0)
Hemoglobin: 13.2 g/dL (ref 12.0–15.0)
Immature Granulocytes: 0 %
Lymphocytes Relative: 13 %
Lymphs Abs: 1.3 10*3/uL (ref 0.7–4.0)
MCH: 26.8 pg (ref 26.0–34.0)
MCHC: 32.4 g/dL (ref 30.0–36.0)
MCV: 82.6 fL (ref 80.0–100.0)
Monocytes Absolute: 0.5 10*3/uL (ref 0.1–1.0)
Monocytes Relative: 5 %
Neutro Abs: 8 10*3/uL — ABNORMAL HIGH (ref 1.7–7.7)
Neutrophils Relative %: 82 %
Platelets: 237 10*3/uL (ref 150–400)
RBC: 4.93 MIL/uL (ref 3.87–5.11)
RDW: 14.6 % (ref 11.5–15.5)
WBC: 9.9 10*3/uL (ref 4.0–10.5)
nRBC: 0 % (ref 0.0–0.2)

## 2022-06-17 LAB — URINALYSIS, MICROSCOPIC (REFLEX)

## 2022-06-17 LAB — TROPONIN I (HIGH SENSITIVITY): Troponin I (High Sensitivity): 21 ng/L — ABNORMAL HIGH (ref <18)

## 2022-06-17 NOTE — Discharge Instructions (Signed)
Your work-up today was reassuring.  Your blood sugar was slightly high but better than yesterday.  Kidney function has returned to normal.  Potassium was slightly low.  Urine test with a few white blood cells, but not overly concerning for an infection given that you are not having typical symptoms.  The blood test for the heart was stable.   Please continue to try to drink fluids and take the Zofran every 6-8 hours to help prevent nausea.  Please follow-up with your doctor for recheck in the next 3 days.   Return if you have persistent vomiting, worsening abdominal pain, fever, new symptoms or other concerns.

## 2022-06-17 NOTE — ED Provider Notes (Signed)
[facility] EMERGENCY DEPARTMENT Provider Note   CSN: [id number] Arrival date & time: 06/17/22  1904     History  Chief Complaint  Patient presents with   Fatigue    Kaitlyn Ferguson is a 76 y.o. female.  Patient with history of hypertension, hypothyroidism, hyperlipidemia, type 2 diabetes, history of macular degeneration, history of cholecystectomy, appendectomy, and hysterectomy--presents to the emergency department today for ongoing nausea and decreased appetite and fatigue.  Patient was seen in the emergency department yesterday for the same symptoms.  At that time she had lab work-up which was reassuring and she was discharged home with Zofran.  She has taken 2 of these today.  Initially she states that she was improved after treatment in the ED yesterday.  Symptoms returned today.  She was transported by EMS and did feel a little bit better after transport.  No abdominal pain, distention.  She had some diarrhea yesterday but no bowel movements today.  No dysuria, hematuria, increased frequency or urgency.  She has had several weeks of intermittent left-sided headaches which is unusual for her.  No associated strokelike symptoms.  Vision changes from macular degeneration stable.  No suspicious food or water exposures or travel.  No recent antibiotics.       Home Medications Prior to Admission medications   Medication Sig Start Date End Date Taking? Authorizing Provider  Blood Glucose Monitoring Suppl (ONETOUCH VERIO) w/Device KIT Check blood sugars 2 times daily. Code: E11.9 01/18/21   [name], MD  glucose blood (ONETOUCH VERIO) test strip USE AS DIRECTED TWICE DAILY TO TEST BLOOD SUGAR 09/21/21   [name], MD  insulin degludec (TRESIBA FLEXTOUCH) 100 UNIT/ML FlexTouch Pen Inject 54 Units into the skin daily. 01/14/22   [name], MD  Insulin Pen Needle (B-D UF III MINI PEN NEEDLES) 31G X 5 MM MISC USE TWICE A DAY 01/17/22   [name], MD  Lancet Devices (ONE TOUCH DELICA LANCING DEV) MISC Check blood sugars 2 times daily. Code: E11.9 09/15/19   [name], MD  Lancets [facility] DELICA PLUS [id number]) MISC USE TO TEST BLOOD SUGAR TWICE DAILY 03/27/22   [name], MD  levothyroxine (SYNTHROID) 88 MCG tablet TAKE 1 TABLET BY MOUTH EVERY DAY BEFORE BREAKFAST 05/13/22   [name], MD  lisinopril-hydrochlorothiazide (ZESTORETIC) 20-12.5 MG tablet Take 2 tablets by mouth daily. 07/30/21   [name], MD  metFORMIN (GLUCOPHAGE-XR) 500 MG 24 hr tablet TAKE 2 TABLET BY MOUTH WITH BREAKFAST AND 1 TABLET BY MOUTH WITH DINNER 11/19/21   [name], MD  Multiple Vitamin (MULTIVITAMIN WITH MINERALS) TABS tablet Take 1 tablet by mouth daily. With Vitamin D3    [provider]  ondansetron (ZOFRAN-ODT) 4 MG disintegrating tablet 36m ODT q4 hours prn nausea/vomit 06/16/22   ZElnora Morrison MD  OZEMPIC, 1 MG/DOSE, 4 MG/3ML SOPN INJECT 1MG INTO THE SKIN ONCE A WEEK 07/23/21   Hensel, WJamal Collin MD  rosuvastatin (CRESTOR) 5 MG tablet Take 1 tablet (5 mg total) by mouth daily. 05/30/21   HZenia Resides MD  Insulin Syringe-Needle U-100 31G X 5/16" 1 ML MISC Use with insulin as directed. 08/20/12 09/07/13  OCarolin Guernsey MD      Allergies    Gabapentin and Vancomycin    Review of Systems   Review of Systems  Physical Exam Updated Vital Signs BP (!) 200/75 (BP Location: Right Arm)   Pulse 63  Temp 98.1 F (36.7 C) (Oral)   Resp 18   Ht _0  (1.702 m)   Wt 112 kg   SpO2 96%   BMI 38.67 kg/m   Physical Exam Vitals and nursing note reviewed.  Constitutional:      General: She is not in acute distress.    Appearance: She is well-developed.  HENT:     Head: Normocephalic and atraumatic.     Right Ear: External ear normal.     Left Ear: External ear normal.     Nose: Nose normal.     Mouth/Throat:     Mouth: Mucous membranes are moist.  Eyes:     Conjunctiva/sclera: Conjunctivae  normal.  Cardiovascular:     Rate and Rhythm: Normal rate and regular rhythm.     Heart sounds: No murmur heard. Pulmonary:     Effort: No respiratory distress.     Breath sounds: No wheezing, rhonchi or rales.  Abdominal:     Palpations: Abdomen is soft.     Tenderness: There is no abdominal tenderness. There is no guarding or rebound.  Musculoskeletal:     Cervical back: Normal range of motion and neck supple.     Right lower leg: No edema.     Left lower leg: No edema.  Skin:    General: Skin is warm and dry.     Findings: No rash.  Neurological:     General: No focal deficit present.     Mental Status: She is alert. Mental status is at baseline.     Motor: No weakness.  Psychiatric:        Mood and Affect: Mood normal.     ED Results / Procedures / Treatments   Labs (all labs ordered are listed, but only abnormal results are displayed) Labs Reviewed  CBC WITH DIFFERENTIAL/PLATELET - Abnormal; Notable for the following components:      Result Value   Neutro Abs 8.0 (*)    All other components within normal limits  COMPREHENSIVE METABOLIC PANEL - Abnormal; Notable for the following components:   Potassium 3.2 (*)    Glucose, Bld 152 (*)    All other components within normal limits  URINALYSIS, ROUTINE W REFLEX MICROSCOPIC - Abnormal; Notable for the following components:   Hgb urine dipstick TRACE (*)    Protein, ur 100 (*)    Leukocytes,Ua SMALL (*)    All other components within normal limits  URINALYSIS, MICROSCOPIC (REFLEX) - Abnormal; Notable for the following components:   Bacteria, UA RARE (*)    All other components within normal limits  TROPONIN I (HIGH SENSITIVITY) - Abnormal; Notable for the following components:   Troponin I (High Sensitivity) 21 (*)    All other components within normal limits    EKG EKG Interpretation  Date/Time:  Monday June 17 2022 19:09:42 EDT Ventricular Rate:  57 PR Interval:  183 QRS Duration: 114 QT  Interval:  422 QTC Calculation: 411 R Axis:   93 Text Interpretation: Sinus arrhythmia Borderline intraventricular conduction delay Low voltage, precordial leads Borderline repolarization abnormality No significant change since last tracing Confirmed by Dorie Rank [id number]) on 06/17/2022 7:14:10 PM  Radiology DG Chest Portable 1 View  Result Date: 06/16/2022 CLINICAL DATA:  Cough.  Nausea.  Generalized weakness and fatigue. EXAM: PORTABLE CHEST 1 VIEW COMPARISON:  09/16/2013 FINDINGS: Shallow inspiration. Heart size and pulmonary vascularity are normal for technique. Probable linear atelectasis in the lung bases. No pleural effusions. No pneumothorax. Mediastinal contours appear  intact. Calcification of the aorta. Postoperative changes in the shoulders. IMPRESSION: Shallow inspiration with linear atelectasis in the lung bases. No focal consolidation. Electronically Signed   By: Lucienne Capers M.D.   On: 06/16/2022 22:07    Procedures Procedures    Medications Ordered in ED Medications - No data to display  ED Course/ Medical Decision Making/ A&P    Patient seen and examined. History obtained directly from patient.  Reviewed emergency department work-up results and note from yesterday.  Labs/EKG: Ordered CBC, CMP, troponin.  EKG personally reviewed and interpreted as above without any changes.  Imaging: Ordered head CT tonight given recent new headache and persistent nausea.  Medications/Fluids: Symptoms currently controlled.  Most recent vital signs reviewed and are as follows: BP (!) 200/75 (BP Location: Right Arm)   Pulse 63   Temp 98.1 F (36.7 C) (Oral)   Resp 18   Ht _0  (1.702 m)   Wt 112 kg   SpO2 96%   BMI 38.67 kg/m   Initial impression: Nausea, fatigue, headache.   9:22 PM Reassessment performed. Patient appears well.  Nausea continues to be controlled.  Abdominal exam unchanged.  Labs personally reviewed and interpreted including: CBC unremarkable; CMP with  slightly lower potassium to 3.2 from yesterday, slightly improved glucose to 152, creatinine now back to baseline at 0.83; troponin stable from yesterday at 21; UA not overly compelling for infection and patient states she is not having typical symptoms of UTI and therefore I have low concern for UTI.  Imaging personally visualized and interpreted including: Head CT showing chronic infarct, no acute findings.  Patient was not aware of this finding which was present in 2013.  I spent several minutes looking over her old records with her in the room and assured her that this is nothing new.  Reviewed pertinent lab work and imaging with patient at bedside. Questions answered.   Most current vital signs reviewed and are as follows: BP (!) 165/67   Pulse 61   Temp 98.1 F (36.7 C) (Oral)   Resp 17   Ht _1  (1.702 m)   Wt 112 kg   SpO2 96%   BMI 38.67 kg/m   Plan: Discharge to home.   Prescriptions written for: None  Other home care instructions discussed: Brat diet, continued use of Zofran  ED return instructions discussed: The patient was urged to return to the Emergency Department immediately with worsening of current symptoms, worsening abdominal pain, persistent vomiting, blood noted in stools, fever, or any other concerns. The patient verbalized understanding.   Follow-up instructions discussed: Patient encouraged to follow-up with their PCP in 3 days.                           Medical Decision Making Amount and/or Complexity of Data Reviewed Labs: ordered. Radiology: ordered.   Patient with generalized fatigue and nausea.  She had a reassuring work-up in the emergency department yesterday.  Presents with worsening nausea today, however controlled with home Zofran.  Work-up stable from yesterday.  Did check UA and head CT today which also did not provide a good explanation for the patient's symptoms.  Her exam continues to be reassuring.  She looks well and is not actively  vomiting.  Vital signs are reassuring, blood pressure was very high on arrival but has improved.    The patient's vital signs, pertinent lab work and imaging were reviewed and interpreted as discussed in the  ED course. Hospitalization was considered for further testing, treatments, or serial exams/observation. However as patient is well-appearing, has a stable exam, and reassuring studies today, I do not feel that they warrant admission at this time. This plan was discussed with the patient who verbalizes agreement and comfort with this plan and seems reliable and able to return to the Emergency Department with worsening or changing symptoms.          Final Clinical Impression(s) / ED Diagnoses Final diagnoses:  Nausea  Weakness    Rx / DC Orders ED Discharge Orders     None         Suann Larry 06/17/22 2125    Dorie Rank, MD 06/18/22 [phone]

## 2022-06-17 NOTE — ED Triage Notes (Signed)
C/ nausea & fatigue for 2 days. States took '8mg'$  zofran pta without relief. Seen at [facility] yesterday. States hasn't eaten/drank the past few days.

## 2022-06-18 DIAGNOSIS — R140 Abdominal distension (gaseous): Secondary | ICD-10-CM | POA: Diagnosis not present

## 2022-06-18 DIAGNOSIS — I700 Atherosclerosis of aorta: Secondary | ICD-10-CM | POA: Diagnosis not present

## 2022-06-18 DIAGNOSIS — Z0289 Encounter for other administrative examinations: Secondary | ICD-10-CM | POA: Diagnosis not present

## 2022-07-08 DIAGNOSIS — M25571 Pain in right ankle and joints of right foot: Secondary | ICD-10-CM | POA: Diagnosis not present

## 2022-07-08 DIAGNOSIS — Z0289 Encounter for other administrative examinations: Secondary | ICD-10-CM | POA: Diagnosis not present

## 2022-07-08 DIAGNOSIS — R140 Abdominal distension (gaseous): Secondary | ICD-10-CM | POA: Diagnosis not present

## 2022-07-09 ENCOUNTER — Ambulatory Visit
Admission: RE | Admit: 2022-07-09 | Discharge: 2022-07-09 | Disposition: A | Discharge status: Home / Self Care | Payer: Medicare Other | Source: Ambulatory Visit | Attending: Infectious Diseases | Admitting: Infectious Diseases

## 2022-07-09 ENCOUNTER — Other Ambulatory Visit: Payer: Self-pay | Admitting: Infectious Diseases

## 2022-07-09 DIAGNOSIS — M25571 Pain in right ankle and joints of right foot: Secondary | ICD-10-CM

## 2022-07-17 ENCOUNTER — Other Ambulatory Visit: Payer: Self-pay | Admitting: Family Medicine

## 2022-07-17 DIAGNOSIS — E119 Type 2 diabetes mellitus without complications: Secondary | ICD-10-CM

## 2022-07-18 ENCOUNTER — Other Ambulatory Visit: Payer: Self-pay | Admitting: Family Medicine

## 2022-07-18 DIAGNOSIS — I10 Essential (primary) hypertension: Secondary | ICD-10-CM

## 2022-09-02 DIAGNOSIS — D8481 Immunodeficiency due to conditions classified elsewhere: Secondary | ICD-10-CM | POA: Diagnosis not present

## 2022-09-02 DIAGNOSIS — J069 Acute upper respiratory infection, unspecified: Secondary | ICD-10-CM | POA: Diagnosis not present

## 2022-09-02 DIAGNOSIS — H353221 Exudative age-related macular degeneration, left eye, with active choroidal neovascularization: Secondary | ICD-10-CM | POA: Diagnosis not present

## 2022-09-02 DIAGNOSIS — Z0289 Encounter for other administrative examinations: Secondary | ICD-10-CM | POA: Diagnosis not present

## 2022-09-02 DIAGNOSIS — I700 Atherosclerosis of aorta: Secondary | ICD-10-CM | POA: Diagnosis not present

## 2022-09-02 DIAGNOSIS — E039 Hypothyroidism, unspecified: Secondary | ICD-10-CM | POA: Diagnosis not present

## 2022-09-02 DIAGNOSIS — E1142 Type 2 diabetes mellitus with diabetic polyneuropathy: Secondary | ICD-10-CM | POA: Diagnosis not present

## 2022-09-02 DIAGNOSIS — Z794 Long term (current) use of insulin: Secondary | ICD-10-CM | POA: Diagnosis not present

## 2022-09-20 DIAGNOSIS — H35373 Puckering of macula, bilateral: Secondary | ICD-10-CM | POA: Diagnosis not present

## 2022-09-20 DIAGNOSIS — H353112 Nonexudative age-related macular degeneration, right eye, intermediate dry stage: Secondary | ICD-10-CM | POA: Diagnosis not present

## 2022-09-20 DIAGNOSIS — H35363 Drusen (degenerative) of macula, bilateral: Secondary | ICD-10-CM | POA: Diagnosis not present

## 2022-09-20 DIAGNOSIS — H5315 Visual distortions of shape and size: Secondary | ICD-10-CM | POA: Diagnosis not present

## 2022-09-20 DIAGNOSIS — Z961 Presence of intraocular lens: Secondary | ICD-10-CM | POA: Diagnosis not present

## 2022-09-20 DIAGNOSIS — H353221 Exudative age-related macular degeneration, left eye, with active choroidal neovascularization: Secondary | ICD-10-CM | POA: Diagnosis not present

## 2022-09-20 DIAGNOSIS — H53412 Scotoma involving central area, left eye: Secondary | ICD-10-CM | POA: Diagnosis not present

## 2022-09-25 DIAGNOSIS — H353221 Exudative age-related macular degeneration, left eye, with active choroidal neovascularization: Secondary | ICD-10-CM | POA: Diagnosis not present

## 2022-10-07 DIAGNOSIS — E1142 Type 2 diabetes mellitus with diabetic polyneuropathy: Secondary | ICD-10-CM | POA: Diagnosis not present

## 2022-10-07 DIAGNOSIS — Z794 Long term (current) use of insulin: Secondary | ICD-10-CM | POA: Diagnosis not present

## 2022-10-07 DIAGNOSIS — Z0289 Encounter for other administrative examinations: Secondary | ICD-10-CM | POA: Diagnosis not present

## 2022-10-07 DIAGNOSIS — E039 Hypothyroidism, unspecified: Secondary | ICD-10-CM | POA: Diagnosis not present

## 2022-10-16 ENCOUNTER — Other Ambulatory Visit: Payer: Self-pay | Admitting: Family Medicine

## 2022-11-11 DIAGNOSIS — H353111 Nonexudative age-related macular degeneration, right eye, early dry stage: Secondary | ICD-10-CM | POA: Diagnosis not present

## 2022-11-11 DIAGNOSIS — H40021 Open angle with borderline findings, high risk, right eye: Secondary | ICD-10-CM | POA: Diagnosis not present

## 2022-11-11 DIAGNOSIS — Z961 Presence of intraocular lens: Secondary | ICD-10-CM | POA: Diagnosis not present

## 2022-11-11 DIAGNOSIS — E119 Type 2 diabetes mellitus without complications: Secondary | ICD-10-CM | POA: Diagnosis not present

## 2022-11-11 DIAGNOSIS — H353221 Exudative age-related macular degeneration, left eye, with active choroidal neovascularization: Secondary | ICD-10-CM | POA: Diagnosis not present

## 2022-11-11 DIAGNOSIS — H401122 Primary open-angle glaucoma, left eye, moderate stage: Secondary | ICD-10-CM | POA: Diagnosis not present

## 2022-11-11 DIAGNOSIS — Z794 Long term (current) use of insulin: Secondary | ICD-10-CM | POA: Diagnosis not present

## 2022-11-18 DIAGNOSIS — H353221 Exudative age-related macular degeneration, left eye, with active choroidal neovascularization: Secondary | ICD-10-CM | POA: Diagnosis not present

## 2022-12-04 DIAGNOSIS — Z0289 Encounter for other administrative examinations: Secondary | ICD-10-CM | POA: Diagnosis not present

## 2022-12-04 DIAGNOSIS — H353112 Nonexudative age-related macular degeneration, right eye, intermediate dry stage: Secondary | ICD-10-CM | POA: Diagnosis not present

## 2022-12-04 DIAGNOSIS — E1142 Type 2 diabetes mellitus with diabetic polyneuropathy: Secondary | ICD-10-CM | POA: Diagnosis not present

## 2022-12-04 DIAGNOSIS — Z794 Long term (current) use of insulin: Secondary | ICD-10-CM | POA: Diagnosis not present

## 2022-12-04 DIAGNOSIS — I119 Hypertensive heart disease without heart failure: Secondary | ICD-10-CM | POA: Diagnosis not present

## 2022-12-04 DIAGNOSIS — M545 Low back pain, unspecified: Secondary | ICD-10-CM | POA: Diagnosis not present

## 2023-01-01 DIAGNOSIS — Z0289 Encounter for other administrative examinations: Secondary | ICD-10-CM | POA: Diagnosis not present

## 2023-01-01 DIAGNOSIS — E039 Hypothyroidism, unspecified: Secondary | ICD-10-CM | POA: Diagnosis not present

## 2023-01-01 DIAGNOSIS — Z794 Long term (current) use of insulin: Secondary | ICD-10-CM | POA: Diagnosis not present

## 2023-01-01 DIAGNOSIS — E1142 Type 2 diabetes mellitus with diabetic polyneuropathy: Secondary | ICD-10-CM | POA: Diagnosis not present

## 2023-01-22 DIAGNOSIS — H353221 Exudative age-related macular degeneration, left eye, with active choroidal neovascularization: Secondary | ICD-10-CM | POA: Diagnosis not present

## 2023-01-29 DIAGNOSIS — Z13820 Encounter for screening for osteoporosis: Secondary | ICD-10-CM | POA: Diagnosis not present

## 2023-01-29 DIAGNOSIS — Z599 Problem related to housing and economic circumstances, unspecified: Secondary | ICD-10-CM | POA: Diagnosis not present

## 2023-01-29 DIAGNOSIS — Z0289 Encounter for other administrative examinations: Secondary | ICD-10-CM | POA: Diagnosis not present

## 2023-01-29 DIAGNOSIS — Z639 Problem related to primary support group, unspecified: Secondary | ICD-10-CM | POA: Diagnosis not present

## 2023-01-29 DIAGNOSIS — R29818 Other symptoms and signs involving the nervous system: Secondary | ICD-10-CM | POA: Diagnosis not present

## 2023-01-29 DIAGNOSIS — E1142 Type 2 diabetes mellitus with diabetic polyneuropathy: Secondary | ICD-10-CM | POA: Diagnosis not present

## 2023-01-29 DIAGNOSIS — Z794 Long term (current) use of insulin: Secondary | ICD-10-CM | POA: Diagnosis not present

## 2023-02-10 ENCOUNTER — Ambulatory Visit (INDEPENDENT_AMBULATORY_CARE_PROVIDER_SITE_OTHER): Payer: 59 | Admitting: Podiatry

## 2023-02-10 DIAGNOSIS — E11628 Type 2 diabetes mellitus with other skin complications: Secondary | ICD-10-CM | POA: Diagnosis not present

## 2023-02-10 DIAGNOSIS — I739 Peripheral vascular disease, unspecified: Secondary | ICD-10-CM

## 2023-02-10 DIAGNOSIS — M2041 Other hammer toe(s) (acquired), right foot: Secondary | ICD-10-CM

## 2023-02-10 DIAGNOSIS — E119 Type 2 diabetes mellitus without complications: Secondary | ICD-10-CM

## 2023-02-10 DIAGNOSIS — L84 Corns and callosities: Secondary | ICD-10-CM

## 2023-02-10 NOTE — Progress Notes (Unsigned)
Calluses submet head bilateral, callus submet 5 right foot Discussed diabetic shoes.  She is not interested in the shoes at this time but she is interested in the custom diabetic insoles to offload submet 1 and 5 when she gets the calluses. Prefers to follow-up as needed for calluses.      Subjective:  Patient ID: Loray Maples, female    DOB: Apr 25, 1946,  MRN: [id number]  Swannie Rase Arzuaga presents to clinic today for:  Chief Complaint  Patient presents with   Callouses    Patient states she is a diabetic that has corns on the inside of her feet ; diabetic foot exam  . This diabetic patient presents today with concern of painful calluses on both feet, plantar aspect.  Denies injury or open lesions.  Denies any drainage.  She noted she is not interested in diabetic shoes but she might be interested in custom but diabetic insoles if that we will take the pressure off her calluses.  [facility] is [facility].  Last seen 12/04/2022.  Allergies  Allergen Reactions   Gabapentin Nausea Only   Vancomycin Itching    Pre-medicated with benadryl and tylenol 1 hr before administration of vanc    Review of Systems: Negative except as noted in the HPI.  Objective: No changes noted in today's physical examination. There were no vitals filed for this visit.  Nyaisha Causevic [place] is a pleasant 77 y.o. female in NAD. AAO x 3.  Vascular Examination: Capillary refill time is 3-5 seconds to toes bilateral. Palpable DP pulse, nonpalpable PT pulse bilateral.  Digital hair sparse b/l. No pedal edema b/l. Skin temperature gradient WNL b/l. No varicosities b/l. No cyanosis or clubbing noted b/l.   Dermatological Examination: Pedal skin has decreased turgor. No open wounds. No interdigital macerations b/l.   Hyperkeratotic lesion present, with pain on palpation, located submet 1 bilateral and submet 5 right foot.  No surrounding erythema or soft tissue breakdown is  seen.  Neurological Examination: Protective sensation intact with Semmes-Weinstein 10 gram monofilament b/l LE. Vibratory sensation intact b/l LE.  Musculoskeletal Examination: Muscle strength 5/5 to all LE muscle groups b/l.  There is contracture of the right second toe at the PIP joint.  Assessment/Plan: 1. Type 2 diabetes mellitus without complication, without long-term current use of insulin (HCC)   2. Hammertoe of right foot     The hyperkeratotic lesions x 3 were sharply debrided/shaved with sterile #312 blade.  Discussed with the patient what is causing the corns/calluses and reviewed treatment options today, including shaving the the painful lesion(s), off-loading techniques and pads, custom orthotics / shoe modifications.    Discussed the diabetic shoe program with the patient today.  She might be interested in the diabetic shoes if there is some type of sandal-type version of the diabetic shoe that she can wear.  Otherwise she notes that she will continue to wear her sandals for the majority of the summer.  She is only interested in the diabetic inserts if there is no sandal option, and we will need to do custom diabetic insoles to offload the first and fifth metatarsal heads.  Will get patient set up with a consultation with our orthotist in the near future  Return for schedule for diabetic shoes / insert consult with Carney Bern.   Clerance Lav, DPM, FACFAS [facility]     [address]  [place], [place] [address]                Office [phone]  Fax [phone]

## 2023-02-26 DIAGNOSIS — H9313 Tinnitus, bilateral: Secondary | ICD-10-CM | POA: Diagnosis not present

## 2023-02-26 DIAGNOSIS — Z0289 Encounter for other administrative examinations: Secondary | ICD-10-CM | POA: Diagnosis not present

## 2023-02-26 DIAGNOSIS — E039 Hypothyroidism, unspecified: Secondary | ICD-10-CM | POA: Diagnosis not present

## 2023-02-26 DIAGNOSIS — D8481 Immunodeficiency due to conditions classified elsewhere: Secondary | ICD-10-CM | POA: Diagnosis not present

## 2023-02-26 DIAGNOSIS — H353221 Exudative age-related macular degeneration, left eye, with active choroidal neovascularization: Secondary | ICD-10-CM | POA: Diagnosis not present

## 2023-02-26 DIAGNOSIS — I700 Atherosclerosis of aorta: Secondary | ICD-10-CM | POA: Diagnosis not present

## 2023-02-26 DIAGNOSIS — E1142 Type 2 diabetes mellitus with diabetic polyneuropathy: Secondary | ICD-10-CM | POA: Diagnosis not present

## 2023-02-26 DIAGNOSIS — Z794 Long term (current) use of insulin: Secondary | ICD-10-CM | POA: Diagnosis not present

## 2023-03-05 ENCOUNTER — Ambulatory Visit (INDEPENDENT_AMBULATORY_CARE_PROVIDER_SITE_OTHER): Payer: 59 | Admitting: Podiatry

## 2023-03-05 DIAGNOSIS — M2041 Other hammer toe(s) (acquired), right foot: Secondary | ICD-10-CM

## 2023-03-05 DIAGNOSIS — E11628 Type 2 diabetes mellitus with other skin complications: Secondary | ICD-10-CM

## 2023-03-05 DIAGNOSIS — L84 Corns and callosities: Secondary | ICD-10-CM

## 2023-03-05 NOTE — Progress Notes (Signed)
Patient presents today to measured for  diabetic shoes and insoles.  Patient was measured for 1 pair of diabetic shoes and 3 pairs of foam casted diabetic insoles.  HT 5'7 WT 236 SHOE SIZE  12W SHOE TYPE [id number]  TREATING PHYSICIAN JORGE MERINO HERRERA   FINANCIAL FORM SIGNED   Re-appointment for regularly scheduled diabetic foot care visits or if they should experience any trouble with the shoes or insoles.

## 2023-03-26 DIAGNOSIS — Z0289 Encounter for other administrative examinations: Secondary | ICD-10-CM | POA: Diagnosis not present

## 2023-03-26 DIAGNOSIS — I119 Hypertensive heart disease without heart failure: Secondary | ICD-10-CM | POA: Diagnosis not present

## 2023-03-26 DIAGNOSIS — H353221 Exudative age-related macular degeneration, left eye, with active choroidal neovascularization: Secondary | ICD-10-CM | POA: Diagnosis not present

## 2023-03-26 DIAGNOSIS — E039 Hypothyroidism, unspecified: Secondary | ICD-10-CM | POA: Diagnosis not present

## 2023-03-26 DIAGNOSIS — E1142 Type 2 diabetes mellitus with diabetic polyneuropathy: Secondary | ICD-10-CM | POA: Diagnosis not present

## 2023-03-27 ENCOUNTER — Ambulatory Visit: Payer: 59 | Admitting: Audiologist

## 2023-04-08 ENCOUNTER — Ambulatory Visit (INDEPENDENT_AMBULATORY_CARE_PROVIDER_SITE_OTHER): Payer: 59

## 2023-04-08 DIAGNOSIS — E11628 Type 2 diabetes mellitus with other skin complications: Secondary | ICD-10-CM | POA: Diagnosis not present

## 2023-04-08 DIAGNOSIS — L84 Corns and callosities: Secondary | ICD-10-CM

## 2023-04-08 DIAGNOSIS — M2041 Other hammer toe(s) (acquired), right foot: Secondary | ICD-10-CM

## 2023-04-08 DIAGNOSIS — I739 Peripheral vascular disease, unspecified: Secondary | ICD-10-CM

## 2023-04-08 NOTE — Progress Notes (Signed)

## 2023-04-23 DIAGNOSIS — H353221 Exudative age-related macular degeneration, left eye, with active choroidal neovascularization: Secondary | ICD-10-CM | POA: Diagnosis not present

## 2023-05-09 DIAGNOSIS — R6889 Other general symptoms and signs: Secondary | ICD-10-CM | POA: Diagnosis not present

## 2023-05-09 DIAGNOSIS — Z794 Long term (current) use of insulin: Secondary | ICD-10-CM | POA: Diagnosis not present

## 2023-05-09 DIAGNOSIS — Z0289 Encounter for other administrative examinations: Secondary | ICD-10-CM | POA: Diagnosis not present

## 2023-05-09 DIAGNOSIS — E1142 Type 2 diabetes mellitus with diabetic polyneuropathy: Secondary | ICD-10-CM | POA: Diagnosis not present

## 2023-05-19 DIAGNOSIS — Z961 Presence of intraocular lens: Secondary | ICD-10-CM | POA: Diagnosis not present

## 2023-05-19 DIAGNOSIS — Z794 Long term (current) use of insulin: Secondary | ICD-10-CM | POA: Diagnosis not present

## 2023-05-19 DIAGNOSIS — H353221 Exudative age-related macular degeneration, left eye, with active choroidal neovascularization: Secondary | ICD-10-CM | POA: Diagnosis not present

## 2023-05-19 DIAGNOSIS — E119 Type 2 diabetes mellitus without complications: Secondary | ICD-10-CM | POA: Diagnosis not present

## 2023-05-19 DIAGNOSIS — H40021 Open angle with borderline findings, high risk, right eye: Secondary | ICD-10-CM | POA: Diagnosis not present

## 2023-05-19 DIAGNOSIS — H353111 Nonexudative age-related macular degeneration, right eye, early dry stage: Secondary | ICD-10-CM | POA: Diagnosis not present

## 2023-05-19 DIAGNOSIS — H401122 Primary open-angle glaucoma, left eye, moderate stage: Secondary | ICD-10-CM | POA: Diagnosis not present

## 2023-05-29 ENCOUNTER — Other Ambulatory Visit: Payer: Self-pay | Admitting: Family Medicine

## 2023-05-29 DIAGNOSIS — Z1231 Encounter for screening mammogram for malignant neoplasm of breast: Secondary | ICD-10-CM

## 2023-06-04 DIAGNOSIS — H353221 Exudative age-related macular degeneration, left eye, with active choroidal neovascularization: Secondary | ICD-10-CM | POA: Diagnosis not present

## 2023-06-20 ENCOUNTER — Ambulatory Visit
Admission: RE | Admit: 2023-06-20 | Discharge: 2023-06-20 | Disposition: A | Discharge status: Home / Self Care | Payer: 59 | Source: Ambulatory Visit | Attending: Family Medicine

## 2023-06-20 DIAGNOSIS — Z1231 Encounter for screening mammogram for malignant neoplasm of breast: Secondary | ICD-10-CM | POA: Diagnosis not present

## 2023-07-07 DIAGNOSIS — E1142 Type 2 diabetes mellitus with diabetic polyneuropathy: Secondary | ICD-10-CM | POA: Diagnosis not present

## 2023-07-07 DIAGNOSIS — R1900 Intra-abdominal and pelvic swelling, mass and lump, unspecified site: Secondary | ICD-10-CM | POA: Diagnosis not present

## 2023-07-07 DIAGNOSIS — Z0289 Encounter for other administrative examinations: Secondary | ICD-10-CM | POA: Diagnosis not present

## 2023-07-09 DIAGNOSIS — H353221 Exudative age-related macular degeneration, left eye, with active choroidal neovascularization: Secondary | ICD-10-CM | POA: Diagnosis not present

## 2023-08-18 DIAGNOSIS — M25571 Pain in right ankle and joints of right foot: Secondary | ICD-10-CM | POA: Diagnosis not present

## 2023-08-18 DIAGNOSIS — E1142 Type 2 diabetes mellitus with diabetic polyneuropathy: Secondary | ICD-10-CM | POA: Diagnosis not present

## 2023-08-18 DIAGNOSIS — K1379 Other lesions of oral mucosa: Secondary | ICD-10-CM | POA: Diagnosis not present

## 2023-08-18 DIAGNOSIS — E1159 Type 2 diabetes mellitus with other circulatory complications: Secondary | ICD-10-CM | POA: Diagnosis not present

## 2023-08-18 DIAGNOSIS — Z794 Long term (current) use of insulin: Secondary | ICD-10-CM | POA: Diagnosis not present

## 2023-08-18 DIAGNOSIS — Z0289 Encounter for other administrative examinations: Secondary | ICD-10-CM | POA: Diagnosis not present

## 2023-08-18 DIAGNOSIS — R140 Abdominal distension (gaseous): Secondary | ICD-10-CM | POA: Diagnosis not present

## 2023-08-20 ENCOUNTER — Emergency Department (HOSPITAL_COMMUNITY)
Admission: EM | Admit: 2023-08-20 | Discharge: 2023-08-20 | Disposition: A | Discharge status: Home / Self Care | Payer: 59 | Attending: Emergency Medicine | Admitting: Emergency Medicine

## 2023-08-20 ENCOUNTER — Other Ambulatory Visit: Payer: Self-pay

## 2023-08-20 ENCOUNTER — Encounter (HOSPITAL_COMMUNITY): Payer: Self-pay

## 2023-08-20 ENCOUNTER — Emergency Department (HOSPITAL_COMMUNITY): Payer: 59

## 2023-08-20 DIAGNOSIS — R519 Headache, unspecified: Secondary | ICD-10-CM | POA: Insufficient documentation

## 2023-08-20 DIAGNOSIS — Z794 Long term (current) use of insulin: Secondary | ICD-10-CM | POA: Diagnosis not present

## 2023-08-20 DIAGNOSIS — Z79899 Other long term (current) drug therapy: Secondary | ICD-10-CM | POA: Insufficient documentation

## 2023-08-20 DIAGNOSIS — E119 Type 2 diabetes mellitus without complications: Secondary | ICD-10-CM | POA: Diagnosis not present

## 2023-08-20 DIAGNOSIS — R109 Unspecified abdominal pain: Secondary | ICD-10-CM | POA: Diagnosis not present

## 2023-08-20 DIAGNOSIS — I10 Essential (primary) hypertension: Secondary | ICD-10-CM | POA: Diagnosis not present

## 2023-08-20 DIAGNOSIS — G9389 Other specified disorders of brain: Secondary | ICD-10-CM | POA: Diagnosis not present

## 2023-08-20 DIAGNOSIS — Z7984 Long term (current) use of oral hypoglycemic drugs: Secondary | ICD-10-CM | POA: Insufficient documentation

## 2023-08-20 DIAGNOSIS — N281 Cyst of kidney, acquired: Secondary | ICD-10-CM | POA: Diagnosis not present

## 2023-08-20 DIAGNOSIS — N12 Tubulo-interstitial nephritis, not specified as acute or chronic: Secondary | ICD-10-CM | POA: Insufficient documentation

## 2023-08-20 DIAGNOSIS — E039 Hypothyroidism, unspecified: Secondary | ICD-10-CM | POA: Insufficient documentation

## 2023-08-20 DIAGNOSIS — G238 Other specified degenerative diseases of basal ganglia: Secondary | ICD-10-CM | POA: Diagnosis not present

## 2023-08-20 LAB — URINALYSIS, W/ REFLEX TO CULTURE (INFECTION SUSPECTED)
Bilirubin Urine: NEGATIVE
Glucose, UA: >=500 mg/dL — AB
Hgb urine dipstick: NEGATIVE
Ketones, ur: NEGATIVE mg/dL
Nitrite: NEGATIVE
Protein, ur: 100 mg/dL — AB
Specific Gravity, Urine: 1.027 (ref 1.005–1.030)
WBC, UA: >50 WBC/hpf (ref 0–5)
pH: 5 (ref 5.0–8.0)

## 2023-08-20 LAB — CBC WITH DIFFERENTIAL/PLATELET
Abs Immature Granulocytes: 0.02 10*3/uL (ref 0.00–0.07)
Basophils Absolute: 0 10*3/uL (ref 0.0–0.1)
Basophils Relative: 0 %
Eosinophils Absolute: 0.1 10*3/uL (ref 0.0–0.5)
Eosinophils Relative: 1 %
HCT: 44.2 % (ref 36.0–46.0)
Hemoglobin: 13.8 g/dL (ref 12.0–15.0)
Immature Granulocytes: 0 %
Lymphocytes Relative: 32 %
Lymphs Abs: 2.8 10*3/uL (ref 0.7–4.0)
MCH: 26.8 pg (ref 26.0–34.0)
MCHC: 31.2 g/dL (ref 30.0–36.0)
MCV: 86 fL (ref 80.0–100.0)
Monocytes Absolute: 0.9 10*3/uL (ref 0.1–1.0)
Monocytes Relative: 11 %
Neutro Abs: 4.8 10*3/uL (ref 1.7–7.7)
Neutrophils Relative %: 56 %
Platelets: 238 10*3/uL (ref 150–400)
RBC: 5.14 MIL/uL — ABNORMAL HIGH (ref 3.87–5.11)
RDW: 16 % — ABNORMAL HIGH (ref 11.5–15.5)
WBC: 8.7 10*3/uL (ref 4.0–10.5)
nRBC: 0 % (ref 0.0–0.2)

## 2023-08-20 LAB — COMPREHENSIVE METABOLIC PANEL
ALT: 13 U/L (ref 0–44)
AST: 20 U/L (ref 15–41)
Albumin: 3.4 g/dL — ABNORMAL LOW (ref 3.5–5.0)
Alkaline Phosphatase: 58 U/L (ref 38–126)
Anion gap: 10 (ref 5–15)
BUN: 18 mg/dL (ref 8–23)
CO2: 27 mmol/L (ref 22–32)
Calcium: 9.4 mg/dL (ref 8.9–10.3)
Chloride: 104 mmol/L (ref 98–111)
Creatinine, Ser: 1 mg/dL (ref 0.44–1.00)
GFR, Estimated: 58 mL/min — ABNORMAL LOW (ref >60)
Glucose, Bld: 117 mg/dL — ABNORMAL HIGH (ref 70–99)
Potassium: 3.4 mmol/L — ABNORMAL LOW (ref 3.5–5.1)
Sodium: 141 mmol/L (ref 135–145)
Total Bilirubin: 0.6 mg/dL (ref <1.2)
Total Protein: 6.2 g/dL — ABNORMAL LOW (ref 6.5–8.1)

## 2023-08-20 MED ORDER — CEFDINIR 300 MG PO CAPS: 300.0000 mg | ORAL_CAPSULE | Freq: Two times a day (BID) | ORAL | 0 refills | Status: AC

## 2023-08-20 MED ORDER — ONDANSETRON 4 MG PO TBDP
8.0000 mg | ORAL_TABLET | Freq: Once | ORAL | Status: AC
  Administered 2023-08-20: 8 mg via ORAL
  Filled 2023-08-20: qty 2

## 2023-08-20 MED ORDER — LIDOCAINE HCL (PF) 1 % IJ SOLN
2.0000 mL | Freq: Once | INTRAMUSCULAR | Status: AC
  Administered 2023-08-20: 2 mL
  Filled 2023-08-20: qty 5

## 2023-08-20 MED ORDER — CEFTRIAXONE SODIUM 1 G IJ SOLR
1.0000 g | Freq: Once | INTRAMUSCULAR | Status: AC
  Administered 2023-08-20: 1 g via INTRAMUSCULAR
  Filled 2023-08-20: qty 10

## 2023-08-20 MED ORDER — ACETAMINOPHEN 500 MG PO TABS
1000.0000 mg | ORAL_TABLET | Freq: Once | ORAL | Status: AC
  Administered 2023-08-20: 1000 mg via ORAL
  Filled 2023-08-20: qty 2

## 2023-08-20 MED ORDER — ACETAMINOPHEN 500 MG PO TABS
ORAL_TABLET | ORAL | Status: AC
  Filled 2023-08-20: qty 1

## 2023-08-20 NOTE — ED Provider Notes (Signed)
[place] EMERGENCY DEPARTMENT AT [facility] Provider Note  CSN: [id number] Arrival date & time: 08/20/23 1625  Chief Complaint(s) Hypertension  HPI Kaitlyn Ferguson is a 77 y.o. female history of hyperlipidemia, hypertension, diabetes presenting to the emergency department with high blood pressure.  She reports that she was concerned because her high blood pressure was over 200.  She does report recently she has had fatigue, generalized weakness, headaches, right flank pain.  No urinary symptoms.  No abdominal pain.  No chest pain or shortness of breath.  No leg swelling.  No numbness or tingling, focal weakness, head trauma.  No fevers or chills.   Past Medical History Past Medical History:  Diagnosis Date   Anatomical narrow angle glaucoma with borderline intraocular pressure, right 02/01/2020   Arthritis    Diabetes mellitus    DJD (degenerative joint disease) 10/07/2008   DJD (degenerative joint disease)    Family history of adverse reaction to anesthesia    MOTHER WAS DIFFICULT TO WAKE UP   Hyperlipidemia    Hypertension    Hypothyroidism    Nausea    INTERMITANT - TAKES PRILOSEC (DENIES REFLUX)   Obesity    Osteomyelitis of ankle (HCC)    Complications of Right ankle injury   Patient Active Problem List   Diagnosis Date Noted   Macular pucker, left eye 02/05/2022   Swelling of hand joint, left 02/24/2021   Lumbar and sacral osteoarthritis 01/11/2021   Stress due to illness of family member 01/11/2021   Intermediate stage nonexudative age-related macular degeneration of right eye 11/21/2020   Colon polyps 09/15/2020   Exudative age-related macular degeneration of left eye with active choroidal neovascularization (HCC) 02/01/2020   Serous detachment of retinal pigment epithelium of left eye 02/01/2020   Right epiretinal membrane 02/01/2020   Neck pain 01/18/2020   Status post arthroscopy of left shoulder 11/04/2019   Status post total shoulder  arthroplasty, left 11/04/2019   S/P shoulder replacement, right 07/11/2016   Primary osteoarthritis of left knee 12/21/2015   Cervical strain 02/28/2015   Nausea 06/20/2011   Type 2 diabetes mellitus with ophthalmic complication (HCC) 11/28/2008   Hyperlipidemia 03/18/2007   Hypothyroidism 10/23/2006   Morbid obesity (HCC) 10/23/2006   Primary hypertension 10/23/2006   ARTHRITIS, DEGENERATIVE 10/23/2006   Home Medication(s) Prior to Admission medications   Medication Sig Start Date End Date Taking? Authorizing Provider  cefdinir (OMNICEF) 300 MG capsule Take 1 capsule (300 mg total) by mouth 2 (two) times daily for 10 days. 08/20/23 08/30/23 Yes Lonell Grandchild, MD  Blood Glucose Monitoring Suppl (ONETOUCH VERIO) w/Device KIT Check blood sugars 2 times daily. Code: E11.9 01/18/21   Moses Manners, MD  glucose blood (ONETOUCH VERIO) test strip USE AS DIRECTED TWICE DAILY TO TEST BLOOD SUGAR 09/21/21   Moses Manners, MD  Insulin Pen Needle (B-D UF III MINI PEN NEEDLES) 31G X 5 MM MISC USE TWICE A DAY 01/17/22   Moses Manners, MD  Lancet Devices (ONE TOUCH DELICA LANCING DEV) MISC Check blood sugars 2 times daily. Code: E11.9 09/15/19   Moses Manners, MD  Lancets [facility] DELICA PLUS LANCET33G) MISC USE TO TEST BLOOD SUGAR TWICE DAILY 03/27/22   Moses Manners, MD  levothyroxine (SYNTHROID) 88 MCG tablet TAKE 1 TABLET BY MOUTH EVERY DAY BEFORE BREAKFAST 05/13/22   Moses Manners, MD  lisinopril-hydrochlorothiazide (ZESTORETIC) 20-12.5 MG tablet Take 2 tablets by mouth daily. 07/30/21   Moses Manners, MD  metFORMIN (GLUCOPHAGE-XR) 500 MG 24 hr tablet TAKE 2 TABLET BY MOUTH WITH BREAKFAST AND 1 TABLET BY MOUTH WITH DINNER 11/19/21   Moses Manners, MD  Multiple Vitamin (MULTIVITAMIN WITH MINERALS) TABS tablet Take 1 tablet by mouth daily. With Vitamin D3    [provider]  ondansetron (ZOFRAN-ODT) 4 MG disintegrating tablet 4mg  ODT q4 hours prn nausea/vomit  06/16/22   Blane Ohara, MD  OZEMPIC, 1 MG/DOSE, 4 MG/3ML SOPN INJECT 1MG  INTO THE SKIN ONCE A WEEK 07/23/21   Hensel, Santiago Bumpers, MD  rosuvastatin (CRESTOR) 5 MG tablet Take 1 tablet (5 mg total) by mouth daily. 05/30/21   Moses Manners, MD  TRESIBA FLEXTOUCH 100 UNIT/ML FlexTouch Pen ADMINISTER 54 UNITS UNDER THE SKIN DAILY 07/17/22   Moses Manners, MD  Insulin Syringe-Needle U-100 31G X 5/16" 1 ML MISC Use with insulin as directed. 08/20/12 09/07/13  Shelly Rubenstein, MD                                                                                                                                    Past Surgical History Past Surgical History:  Procedure Laterality Date   ABDOMINAL HYSTERECTOMY     ANKLE FUSION  02/1996   Right ankle tib/talar   BACK SURGERY  1984   BREAST EXCISIONAL BIOPSY Right    CARPAL TUNNEL RELEASE  1993   Right arm   CHOLECYSTECTOMY     EYE SURGERY Left    NECK MASS EXCISION  12/29/2001   right eye laser     TONSILLECTOMY     Childhood   TOTAL KNEE ARTHROPLASTY Left 12/21/2015   Procedure: LEFT TOTAL KNEE ARTHROPLASTY WITH COMPLEX COMPUTER NAVIGATION ;  Surgeon: Samson Frederic, MD;  Location: [facility];  Service: Orthopedics;  Laterality: Left;   TOTAL SHOULDER ARTHROPLASTY Right 07/11/2016   Procedure: RIGHT TOTAL SHOULDER ARTHROPLASTY;  Surgeon: Francena Hanly, MD;  Location: [facility] OR;  Service: Orthopedics;  Laterality: Right;   TOTAL SHOULDER ARTHROPLASTY Left 11/04/2019   Procedure: TOTAL SHOULDER ARTHROPLASTY;  Surgeon: Francena Hanly, MD;  Location: [facility];  Service: Orthopedics;  Laterality: Left;    Family History Family History  Problem Relation Age of Onset   Stroke Mother    Heart failure Mother    Multiple myeloma Mother        Deceased at 53 due to same   Heart attack Father        First MI- early 80's   Diabetes Brother    Lung cancer Daughter    Diabetes Son    Heart attack Son    Obesity Son    Thyroid disease Son    Thyroid  disease Son    Thyroid disease Son    Breast cancer Paternal Aunt     Social History Social History   Tobacco Use   Smoking status: Never   Smokeless tobacco: Never  Vaping  Use   Vaping status: Never Used  Substance Use Topics   Alcohol use: No    Alcohol/week: 0.0 standard drinks of alcohol   Drug use: No   Allergies Gabapentin and Vancomycin  Review of Systems Review of Systems  All other systems reviewed and are negative.   Physical Exam Vital Signs  I have reviewed the triage vital signs BP (!) 178/70 (BP Location: Left Arm)   Pulse (!) 56   Temp (!) 97.5 F (36.4 C) (Oral)   Resp 18   Ht 5\' 7"  (1.702 m)   Wt 112 kg   SpO2 95%   BMI 38.67 kg/m  Physical Exam Vitals and nursing note reviewed.  Constitutional:      General: She is not in acute distress.    Appearance: She is well-developed.  HENT:     Head: Normocephalic and atraumatic.     Mouth/Throat:     Mouth: Mucous membranes are moist.  Eyes:     Pupils: Pupils are equal, round, and reactive to light.  Cardiovascular:     Rate and Rhythm: Normal rate and regular rhythm.     Heart sounds: No murmur heard. Pulmonary:     Effort: Pulmonary effort is normal. No respiratory distress.     Breath sounds: Normal breath sounds.  Abdominal:     General: Abdomen is flat.     Palpations: Abdomen is soft.     Tenderness: There is no abdominal tenderness. There is right CVA tenderness.  Musculoskeletal:        General: No tenderness.     Right lower leg: No edema.     Left lower leg: No edema.  Skin:    General: Skin is warm and dry.  Neurological:     General: No focal deficit present.     Mental Status: She is alert. Mental status is at baseline.     Cranial Nerves: No cranial nerve deficit.  Psychiatric:        Mood and Affect: Mood normal.        Behavior: Behavior normal.     ED Results and Treatments Labs (all labs ordered are listed, but only abnormal results are displayed) Labs  Reviewed  CBC WITH DIFFERENTIAL/PLATELET - Abnormal; Notable for the following components:      Result Value   RBC 5.14 (*)    RDW 16.0 (*)    All other components within normal limits  COMPREHENSIVE METABOLIC PANEL - Abnormal; Notable for the following components:   Potassium 3.4 (*)    Glucose, Bld 117 (*)    Total Protein 6.2 (*)    Albumin 3.4 (*)    GFR, Estimated 58 (*)    All other components within normal limits  URINALYSIS, W/ REFLEX TO CULTURE (INFECTION SUSPECTED) - Abnormal; Notable for the following components:   APPearance HAZY (*)    Glucose, UA >=500 (*)    Protein, ur 100 (*)    Leukocytes,Ua LARGE (*)    Bacteria, UA RARE (*)    Non Squamous Epithelial 0-5 (*)    All other components within normal limits  URINE CULTURE  Radiology CT Renal Stone Study Result Date: 08/20/2023 CLINICAL DATA:  Abdominal and flank pain with stone suspected. Headache. EXAM: CT ABDOMEN AND PELVIS WITHOUT CONTRAST TECHNIQUE: Multidetector CT imaging of the abdomen and pelvis was performed following the standard protocol without IV contrast. RADIATION DOSE REDUCTION: This exam was performed according to the departmental dose-optimization program which includes automated exposure control, adjustment of the mA and/or kV according to patient size and/or use of iterative reconstruction technique. COMPARISON:  02/15/2019 FINDINGS: Lower chest: Motion artifact limits evaluation. Probable atelectasis in the lung bases. Hepatobiliary: No focal liver abnormality is seen. Status post cholecystectomy. No biliary dilatation. Pancreas: Unremarkable. No pancreatic ductal dilatation or surrounding inflammatory changes. Spleen: Normal in size without focal abnormality. Adrenals/Urinary Tract: No adrenal gland nodules. No hydronephrosis or hydroureter. Bilateral renal cysts, largest measuring up  to 2.2 cm in diameter. Some cysts demonstrate diffusely increased density likely representing hemorrhagic cysts. No change since prior study. No imaging follow-up is indicated. Bladder is normal. Stomach/Bowel: Stomach, small bowel, and colon are not abnormally distended. No wall thickening or inflammatory changes. Stool fills the colon possibly indicating constipation. Appendix is not identified. Vascular/Lymphatic: Calcification of the aorta. No aneurysm. Mild central mesenteric infiltration with small lymph nodes present. No pathologic lymphadenopathy. Appearances are similar to prior study, likely inflammatory. Consider chronic mesenteritis. Reproductive: Status post hysterectomy. No adnexal masses. Other: No abdominal wall hernia or abnormality. No abdominopelvic ascites. Musculoskeletal: No acute or significant osseous findings. Degenerative changes in the spine. IMPRESSION: 1. No renal or ureteral stone or obstruction. Multiple small intrarenal cysts bilaterally, some demonstrating hemorrhagic change. No imaging follow-up is indicated. 2. Aortic atherosclerosis. Electronically Signed   By: Burman Nieves M.D.   On: 08/20/2023 19:52   CT Head Wo Contrast Result Date: 08/20/2023 CLINICAL DATA:  New onset headache and abdominal pain EXAM: CT HEAD WITHOUT CONTRAST TECHNIQUE: Contiguous axial images were obtained from the base of the skull through the vertex without intravenous contrast. RADIATION DOSE REDUCTION: This exam was performed according to the departmental dose-optimization program which includes automated exposure control, adjustment of the mA and/or kV according to patient size and/or use of iterative reconstruction technique. COMPARISON:  06/17/2022 FINDINGS: Brain: Focal area of encephalomalacia in the left posterior parietal lobe is unchanged since prior study. This is likely an old infarct. No acute changes are demonstrated. Suggestion of mild low-attenuation in the left anterior temporal  lobe, poorly defined. This is not seen on the previous study. This may indicate early ischemia. Suggest MRI for further evaluation. No mass-effect or midline shift. No abnormal extra-axial fluid collections. No ventricular dilatation. Basal cisterns are not effaced. Calcification in the basal ganglia. No acute intracranial hemorrhage. Vascular: No hyperdense vessel or unexpected calcification. Skull: Normal. Negative for fracture or focal lesion. Sinuses/Orbits: Paranasal sinuses and mastoid air cells are clear. Other: None. IMPRESSION: 1. Vague low-attenuation in the left anterior temporal lobe, possibly acute ischemic process. Suggest MRI for further evaluation. 2. Old infarct in the left parietal region unchanged. 3. No acute mass effect.  No acute intracranial hemorrhage. Electronically Signed   By: Burman Nieves M.D.   On: 08/20/2023 19:48    Pertinent labs & imaging results that were available during my care of the patient were reviewed by me and considered in my medical decision making (see MDM for details).  Medications Ordered in ED Medications  cefTRIAXone (ROCEPHIN) injection 1 g (has no administration in time range)  lidocaine (PF) (XYLOCAINE) 1 % injection 2 mL (has no administration in  time range)  ondansetron (ZOFRAN-ODT) disintegrating tablet 8 mg (8 mg Oral Given 08/20/23 1839)  acetaminophen (TYLENOL) tablet 1,000 mg (1,000 mg Oral Not Given 08/20/23 1857)                                                                                                                                     Procedures Procedures  (including critical care time)  Medical Decision Making / ED Course   MDM:  77 year old female presenting to the emergency department with high blood pressure.  Patient overall well-appearing, physical exam with some right CVA tenderness, otherwise unremarkable.  Unclear cause of high blood pressure, could be related to underlying discomfort.  Will check labs, she  does have a headache so we will check CT head, reports she does not normally get headaches.  Will obtain urinalysis given CVA tenderness.  Will reassess.  Patient denies any other concerning symptoms like visual changes, chest pain, shortness of breath to suggest acute hypertensive emergency.  If her workup is reassuring anticipate discharge  Clinical Course as of 08/20/23 2243  Wed Aug 20, 2023  2241 Patient reports that she is feeling better after nausea and pain medications.  Her CT head and CT abdomen are unremarkable.  No evidence of nephrolithiasis.  Urinalysis is concerning for urinary infection with WBC clumps, bacteria, and significant WBCs.  Suspect symptoms likely due to underlying pyelonephritis.  Her discomfort from this may be causing her high blood pressure.  Advised keeping a log of this at home and following up with primary doctor for both issues.  Will give a dose of ceftriaxone in the emergency department.  Will prescribe cefdinir. Will discharge patient to home. All questions answered. Patient comfortable with plan of discharge. Return precautions discussed with patient and specified on the after visit summary.  [WS]    Clinical Course User Index [WS] Suezanne Jacquet, Jerilee Field, MD     Additional history obtained: -Additional history obtained from family  -External records from outside source obtained and reviewed including: Chart review including previous notes, labs, imaging, consultation notes including prior notes    Lab Tests: -I ordered, reviewed, and interpreted labs.   The pertinent results include:   Labs Reviewed  CBC WITH DIFFERENTIAL/PLATELET - Abnormal; Notable for the following components:      Result Value   RBC 5.14 (*)    RDW 16.0 (*)    All other components within normal limits  COMPREHENSIVE METABOLIC PANEL - Abnormal; Notable for the following components:   Potassium 3.4 (*)    Glucose, Bld 117 (*)    Total Protein 6.2 (*)    Albumin 3.4 (*)    GFR,  Estimated 58 (*)    All other components within normal limits  URINALYSIS, W/ REFLEX TO CULTURE (INFECTION SUSPECTED) - Abnormal; Notable for the following components:   APPearance HAZY (*)    Glucose, UA >=500 (*)    Protein,  ur 100 (*)    Leukocytes,Ua LARGE (*)    Bacteria, UA RARE (*)    Non Squamous Epithelial 0-5 (*)    All other components within normal limits  URINE CULTURE    Notable for + UTI    Imaging Studies ordered: I ordered imaging studies including CT abdomen, CT head  On my interpretation imaging demonstrates no acute process I independently visualized and interpreted imaging. I agree with the radiologist interpretation   Medicines ordered and prescription drug management: Meds ordered this encounter  Medications   ondansetron (ZOFRAN-ODT) disintegrating tablet 8 mg   acetaminophen (TYLENOL) tablet 1,000 mg   acetaminophen (TYLENOL) 500 MG tablet    Meta Hatchet: cabinet override   cefTRIAXone (ROCEPHIN) injection 1 g    Antibiotic Indication::   UTI   lidocaine (PF) (XYLOCAINE) 1 % injection 2 mL   cefdinir (OMNICEF) 300 MG capsule    Sig: Take 1 capsule (300 mg total) by mouth 2 (two) times daily for 10 days.    Dispense:  20 capsule    Refill:  0    -I have reviewed the patients home medicines and have made adjustments as needed   Social Determinants of Health:  Diagnosis or treatment significantly limited by social determinants of health: obesity   Reevaluation: After the interventions noted above, I reevaluated the patient and found that their symptoms have improved  Co morbidities that complicate the patient evaluation  Past Medical History:  Diagnosis Date   Anatomical narrow angle glaucoma with borderline intraocular pressure, right 02/01/2020   Arthritis    Diabetes mellitus    DJD (degenerative joint disease) 10/07/2008   DJD (degenerative joint disease)    Family history of adverse reaction to anesthesia    MOTHER WAS DIFFICULT TO  WAKE UP   Hyperlipidemia    Hypertension    Hypothyroidism    Nausea    INTERMITANT - TAKES PRILOSEC (DENIES REFLUX)   Obesity    Osteomyelitis of ankle (HCC)    Complications of Right ankle injury      Dispostion: Disposition decision including need for hospitalization was considered, and patient discharged from emergency department.    Final Clinical Impression(s) / ED Diagnoses Final diagnoses:  Pyelonephritis     This chart was dictated using voice recognition software.  Despite best efforts to proofread,  errors can occur which can change the documentation meaning.    Lonell Grandchild, MD 08/20/23 [phone]

## 2023-08-20 NOTE — Discharge Instructions (Signed)
We evaluated you for your high blood pressure and backache.  Your symptoms are likely due to a kidney infection.  Your urinalysis showed signs of a urinary infection.  Your CT scan did not show any other dangerous problems.  Your CT scan did not show any dangerous intracranial problems like a tumor or bleeding.  I think that your blood pressure is most likely elevated due to your discomfort.  Hopefully, this should improve with treatment of your underlying infection.  Please take your medications as prescribed.  Please follow-up closely with your primary doctor.  Please return to the emergency department if you develop any new or worsening symptoms such as severe pain, lightheadedness or dizziness, low blood pressure, fevers or chills, vomiting, or any other concerning symptoms.

## 2023-08-20 NOTE — ED Notes (Signed)
Pt ambulated to bathroom w/assistance.

## 2023-08-20 NOTE — ED Triage Notes (Signed)
Pt is coming from home, this all started Sunday, she has been tracking her pressure at home and has had 180 systolic pressures, she went to pcp Monday and they increased her lisinpril to 40mg  and she took 60mg  to   Medic vitals   200/72  --> 176/94 62hr 95%ra 18rr 165bgl

## 2023-08-22 LAB — URINE CULTURE

## 2023-08-24 ENCOUNTER — Encounter (HOSPITAL_COMMUNITY): Payer: Self-pay | Admitting: Emergency Medicine

## 2023-08-24 ENCOUNTER — Emergency Department (HOSPITAL_COMMUNITY)
Admission: EM | Admit: 2023-08-24 | Discharge: 2023-08-25 | Disposition: A | Discharge status: Home / Self Care | Payer: 59

## 2023-08-24 ENCOUNTER — Other Ambulatory Visit: Payer: Self-pay

## 2023-08-24 ENCOUNTER — Emergency Department (HOSPITAL_COMMUNITY): Payer: 59

## 2023-08-24 DIAGNOSIS — I10 Essential (primary) hypertension: Secondary | ICD-10-CM | POA: Diagnosis not present

## 2023-08-24 DIAGNOSIS — R110 Nausea: Secondary | ICD-10-CM | POA: Diagnosis not present

## 2023-08-24 DIAGNOSIS — Z96612 Presence of left artificial shoulder joint: Secondary | ICD-10-CM | POA: Diagnosis not present

## 2023-08-24 DIAGNOSIS — Z794 Long term (current) use of insulin: Secondary | ICD-10-CM | POA: Insufficient documentation

## 2023-08-24 DIAGNOSIS — G4489 Other headache syndrome: Secondary | ICD-10-CM | POA: Diagnosis not present

## 2023-08-24 DIAGNOSIS — Z79899 Other long term (current) drug therapy: Secondary | ICD-10-CM | POA: Insufficient documentation

## 2023-08-24 DIAGNOSIS — Z7984 Long term (current) use of oral hypoglycemic drugs: Secondary | ICD-10-CM | POA: Diagnosis not present

## 2023-08-24 DIAGNOSIS — I159 Secondary hypertension, unspecified: Secondary | ICD-10-CM | POA: Insufficient documentation

## 2023-08-24 DIAGNOSIS — R778 Other specified abnormalities of plasma proteins: Secondary | ICD-10-CM | POA: Diagnosis not present

## 2023-08-24 DIAGNOSIS — I499 Cardiac arrhythmia, unspecified: Secondary | ICD-10-CM | POA: Diagnosis not present

## 2023-08-24 DIAGNOSIS — Z743 Need for continuous supervision: Secondary | ICD-10-CM | POA: Diagnosis not present

## 2023-08-24 DIAGNOSIS — R030 Elevated blood-pressure reading, without diagnosis of hypertension: Secondary | ICD-10-CM | POA: Diagnosis not present

## 2023-08-24 DIAGNOSIS — Z96611 Presence of right artificial shoulder joint: Secondary | ICD-10-CM | POA: Diagnosis not present

## 2023-08-24 DIAGNOSIS — R6889 Other general symptoms and signs: Secondary | ICD-10-CM | POA: Diagnosis not present

## 2023-08-24 LAB — CBC WITH DIFFERENTIAL/PLATELET
Abs Immature Granulocytes: 0.01 10*3/uL (ref 0.00–0.07)
Basophils Absolute: 0 10*3/uL (ref 0.0–0.1)
Basophils Relative: 1 %
Eosinophils Absolute: 0.1 10*3/uL (ref 0.0–0.5)
Eosinophils Relative: 1 %
HCT: 46.4 % — ABNORMAL HIGH (ref 36.0–46.0)
Hemoglobin: 14.7 g/dL (ref 12.0–15.0)
Immature Granulocytes: 0 %
Lymphocytes Relative: 28 %
Lymphs Abs: 2 10*3/uL (ref 0.7–4.0)
MCH: 27.1 pg (ref 26.0–34.0)
MCHC: 31.7 g/dL (ref 30.0–36.0)
MCV: 85.6 fL (ref 80.0–100.0)
Monocytes Absolute: 0.8 10*3/uL (ref 0.1–1.0)
Monocytes Relative: 11 %
Neutro Abs: 4.3 10*3/uL (ref 1.7–7.7)
Neutrophils Relative %: 59 %
Platelets: 264 10*3/uL (ref 150–400)
RBC: 5.42 MIL/uL — ABNORMAL HIGH (ref 3.87–5.11)
RDW: 16.1 % — ABNORMAL HIGH (ref 11.5–15.5)
WBC: 7.1 10*3/uL (ref 4.0–10.5)
nRBC: 0 % (ref 0.0–0.2)

## 2023-08-24 LAB — COMPREHENSIVE METABOLIC PANEL
ALT: 14 U/L (ref 0–44)
AST: 18 U/L (ref 15–41)
Albumin: 3.7 g/dL (ref 3.5–5.0)
Alkaline Phosphatase: 63 U/L (ref 38–126)
Anion gap: 7 (ref 5–15)
BUN: 11 mg/dL (ref 8–23)
CO2: 29 mmol/L (ref 22–32)
Calcium: 9.2 mg/dL (ref 8.9–10.3)
Chloride: 105 mmol/L (ref 98–111)
Creatinine, Ser: 0.93 mg/dL (ref 0.44–1.00)
GFR, Estimated: >60 mL/min (ref >60)
Glucose, Bld: 105 mg/dL — ABNORMAL HIGH (ref 70–99)
Potassium: 3.3 mmol/L — ABNORMAL LOW (ref 3.5–5.1)
Sodium: 141 mmol/L (ref 135–145)
Total Bilirubin: 0.6 mg/dL (ref <1.2)
Total Protein: 7.1 g/dL (ref 6.5–8.1)

## 2023-08-24 LAB — URINALYSIS, W/ REFLEX TO CULTURE (INFECTION SUSPECTED)
Bilirubin Urine: NEGATIVE
Glucose, UA: >=500 mg/dL — AB
Hgb urine dipstick: NEGATIVE
Ketones, ur: NEGATIVE mg/dL
Nitrite: NEGATIVE
Protein, ur: 100 mg/dL — AB
Specific Gravity, Urine: 1.015 (ref 1.005–1.030)
WBC, UA: >50 WBC/hpf (ref 0–5)
pH: 6 (ref 5.0–8.0)

## 2023-08-24 LAB — TROPONIN I (HIGH SENSITIVITY)
Troponin I (High Sensitivity): 19 ng/L — ABNORMAL HIGH (ref <18)
Troponin I (High Sensitivity): 21 ng/L — ABNORMAL HIGH (ref <18)

## 2023-08-24 LAB — LIPASE, BLOOD: Lipase: 49 U/L (ref 11–51)

## 2023-08-24 MED ORDER — ONDANSETRON 4 MG PO TBDP
4.0000 mg | ORAL_TABLET | Freq: Once | ORAL | Status: AC
  Administered 2023-08-24: 4 mg via ORAL
  Filled 2023-08-24: qty 1

## 2023-08-24 NOTE — ED Provider Triage Note (Signed)
Emergency Medicine Provider Triage Evaluation Note  Kaitlyn Ferguson , a 77 y.o. female  was evaluated in triage.  Pt complains of nausea, high blood pressure.  Review of Systems  Positive: Nausea, blood-pressure, intermittent headache Negative: Vision changes, weakness, numbness, flank pain, back pain, abdominal pain, chest pain  Physical Exam  BP (!) 183/85 (BP Location: Right Arm)   Pulse 64   Temp 98.1 F (36.7 C) (Oral)   Resp 17   Ht 5\' 7"  (1.702 m)   Wt 105.2 kg   SpO2 95%   BMI 36.34 kg/m  Gen:   Awake, no distress   Resp:  Normal effort  MSK:   Moves extremities without difficulty, no neurologic deficits Other:  No CVA tenderness or abdominal tenderness  Medical Decision Making  Medically screening exam initiated at 3:53 PM.  Appropriate orders placed.  Kaitlyn Ferguson was informed that the remainder of the evaluation will be completed by another provider, this initial triage assessment does not replace that evaluation, and the importance of remaining in the ED until their evaluation is complete.  Patient presenting for high blood pressure, nausea.  Initially started last week, noted her blood pressure was in the 200s.  She saw her PCP on Monday, they increased her lisinopril from 20 mg to 40 mg which she has been taking.  On Wednesday she had mild headache, not sudden onset as well as some nausea without vomiting.  She was seen in the ED had a CT renal stone study and CT head as well as lab work concerning for UTI and was discharged home.  Her urine culture grew out multiple species.  She was called by her doctor's office who wanted to switch her to a different antibiotic but she did not want to.  She was feeling better however today she is felt more nauseous.  Her blood pressure was still high.  She is mildly hypertensive here.  She has no chest pain or shortness of breath.  No vomiting, benign abdominal exam.  Will recheck her urine and repeat lab work here.    Laurence Spates, MD 08/24/23 [phone]

## 2023-08-24 NOTE — ED Triage Notes (Signed)
Pt BIB EMS from home for hypertension, pressure in back of head, intermittent palpitations, and nausea, on and off since visit last week. Denies dizziness.  EMS VS: 218/92 HR 60 RR 20 96% RA CBG 94

## 2023-08-25 LAB — CBG MONITORING, ED: Glucose-Capillary: 73 mg/dL (ref 70–99)

## 2023-08-25 MED ORDER — LISINOPRIL 20 MG PO TABS
20.0000 mg | ORAL_TABLET | Freq: Once | ORAL | Status: AC
  Administered 2023-08-25: 20 mg via ORAL
  Filled 2023-08-25: qty 1

## 2023-08-25 MED ORDER — POTASSIUM CHLORIDE CRYS ER 20 MEQ PO TBCR
40.0000 meq | EXTENDED_RELEASE_TABLET | Freq: Once | ORAL | Status: AC
  Administered 2023-08-25: 40 meq via ORAL
  Filled 2023-08-25: qty 2

## 2023-08-25 MED ORDER — HYDROCHLOROTHIAZIDE 12.5 MG PO TABS
12.5000 mg | ORAL_TABLET | Freq: Once | ORAL | Status: AC
  Administered 2023-08-25: 12.5 mg via ORAL
  Filled 2023-08-25: qty 1

## 2023-08-25 NOTE — ED Provider Notes (Signed)
[place] EMERGENCY DEPARTMENT AT [facility] Provider Note   CSN: [id number] Arrival date & time: 08/24/23  1525     History  Chief Complaint  Patient presents with   Hypertension    Kaitlyn Ferguson is a 77 y.o. female.  77 year old female presenting emergency department with concern of elevated blood pressure.  With she was seen Christmas Day for same was told that she had UTI/pyelonephritis discharge and antibiotics she is 5 days through 10-day course.  She notes that her blood pressure has been going up and down since discharge.  Saw primary doctor who increased her lisinopril dose.  However she notes that blood pressure still has been up to the 200s.  No headache vision changes facial droop unilateral weakness, no chest pain no shortness of breath.  Still making urine.  States that she feels her normal self but she wants to know why her blood pressure is still high.   Hypertension       Home Medications Prior to Admission medications   Medication Sig Start Date End Date Taking? Authorizing Provider  Blood Glucose Monitoring Suppl (ONETOUCH VERIO) w/Device KIT Check blood sugars 2 times daily. Code: E11.9 01/18/21   Moses Manners, MD  cefdinir (OMNICEF) 300 MG capsule Take 1 capsule (300 mg total) by mouth 2 (two) times daily for 10 days. 08/20/23 08/30/23  Lonell Grandchild, MD  glucose blood (ONETOUCH VERIO) test strip USE AS DIRECTED TWICE DAILY TO TEST BLOOD SUGAR 09/21/21   Moses Manners, MD  Insulin Pen Needle (B-D UF III MINI PEN NEEDLES) 31G X 5 MM MISC USE TWICE A DAY 01/17/22   Moses Manners, MD  Lancet Devices (ONE TOUCH DELICA LANCING DEV) MISC Check blood sugars 2 times daily. Code: E11.9 09/15/19   Moses Manners, MD  Lancets [facility] DELICA PLUS LANCET33G) MISC USE TO TEST BLOOD SUGAR TWICE DAILY 03/27/22   Moses Manners, MD  levothyroxine (SYNTHROID) 88 MCG tablet TAKE 1 TABLET BY MOUTH EVERY DAY BEFORE BREAKFAST 05/13/22    Moses Manners, MD  lisinopril-hydrochlorothiazide (ZESTORETIC) 20-12.5 MG tablet Take 2 tablets by mouth daily. 07/30/21   Moses Manners, MD  metFORMIN (GLUCOPHAGE-XR) 500 MG 24 hr tablet TAKE 2 TABLET BY MOUTH WITH BREAKFAST AND 1 TABLET BY MOUTH WITH DINNER 11/19/21   Moses Manners, MD  Multiple Vitamin (MULTIVITAMIN WITH MINERALS) TABS tablet Take 1 tablet by mouth daily. With Vitamin D3    [provider]  ondansetron (ZOFRAN-ODT) 4 MG disintegrating tablet 4mg  ODT q4 hours prn nausea/vomit 06/16/22   Blane Ohara, MD  OZEMPIC, 1 MG/DOSE, 4 MG/3ML SOPN INJECT 1MG  INTO THE SKIN ONCE A WEEK 07/23/21   Hensel, Santiago Bumpers, MD  rosuvastatin (CRESTOR) 5 MG tablet Take 1 tablet (5 mg total) by mouth daily. 05/30/21   Moses Manners, MD  TRESIBA FLEXTOUCH 100 UNIT/ML FlexTouch Pen ADMINISTER 54 UNITS UNDER THE SKIN DAILY 07/17/22   Moses Manners, MD  Insulin Syringe-Needle U-100 31G X 5/16" 1 ML MISC Use with insulin as directed. 08/20/12 09/07/13  Shelly Rubenstein, MD      Allergies    Gabapentin and Vancomycin    Review of Systems   Review of Systems  Physical Exam Updated Vital Signs BP (!) 200/67 (BP Location: Right Arm)   Pulse (!) 57   Temp (!) 97.5 F (36.4 C) (Oral)   Resp 20   Ht 5\' 7"  (1.702 m)   Wt 105.2  kg   SpO2 99%   BMI 36.34 kg/m  Physical Exam Vitals and nursing note reviewed.  Constitutional:      General: She is not in acute distress.    Appearance: She is not toxic-appearing.  Eyes:     Conjunctiva/sclera: Conjunctivae normal.  Cardiovascular:     Rate and Rhythm: Regular rhythm. Bradycardia present.  Pulmonary:     Effort: Pulmonary effort is normal.     Breath sounds: Normal breath sounds.  Abdominal:     General: Abdomen is flat. There is no distension.     Tenderness: There is no abdominal tenderness. There is no guarding or rebound.  Musculoskeletal:     Right lower leg: No edema.     Left lower leg: No edema.  Skin:     General: Skin is warm.     Capillary Refill: Capillary refill takes less than 2 seconds.  Neurological:     Mental Status: She is alert and oriented to person, place, and time.  Psychiatric:        Mood and Affect: Mood normal.        Behavior: Behavior normal.     ED Results / Procedures / Treatments   Labs (all labs ordered are listed, but only abnormal results are displayed) Labs Reviewed  COMPREHENSIVE METABOLIC PANEL - Abnormal; Notable for the following components:      Result Value   Potassium 3.3 (*)    Glucose, Bld 105 (*)    All other components within normal limits  CBC WITH DIFFERENTIAL/PLATELET - Abnormal; Notable for the following components:   RBC 5.42 (*)    HCT 46.4 (*)    RDW 16.1 (*)    All other components within normal limits  URINALYSIS, W/ REFLEX TO CULTURE (INFECTION SUSPECTED) - Abnormal; Notable for the following components:   APPearance HAZY (*)    Glucose, UA >=500 (*)    Protein, ur 100 (*)    Leukocytes,Ua LARGE (*)    Bacteria, UA RARE (*)    Non Squamous Epithelial 0-5 (*)    All other components within normal limits  TROPONIN I (HIGH SENSITIVITY) - Abnormal; Notable for the following components:   Troponin I (High Sensitivity) 19 (*)    All other components within normal limits  TROPONIN I (HIGH SENSITIVITY) - Abnormal; Notable for the following components:   Troponin I (High Sensitivity) 21 (*)    All other components within normal limits  URINE CULTURE  LIPASE, BLOOD  CBG MONITORING, ED    EKG EKG Interpretation Date/Time:  Sunday August 24 2023 15:55:33 EST Ventricular Rate:  60 PR Interval:  162 QRS Duration:  98 QT Interval:  446 QTC Calculation: 446 R Axis:   98  Text Interpretation: Sinus rhythm with Premature atrial complexes Rightward axis T wave abnormality, consider inferior ischemia Abnormal ECG When compared with ECG of 17-Jun-2022 19:09, PREVIOUS ECG IS PRESENT Confirmed by Estanislado Pandy [phone]) on 08/25/2023  7:39:52 AM  Radiology DG Chest 2 View Result Date: 08/24/2023 CLINICAL DATA:  Nausea.  High blood pressure. EXAM: CHEST - 2 VIEW COMPARISON:  06/16/2022. FINDINGS: Bilateral lung fields are clear. Bilateral costophrenic angles are clear. Normal cardio-mediastinal silhouette. No acute osseous abnormalities. Bilateral shoulder arthroplasty noted. The soft tissues are within normal limits. IMPRESSION: *No active cardiopulmonary disease. Electronically Signed   By: Jules Schick M.D.   On: 08/24/2023 16:30    Procedures Procedures    Medications Ordered in ED Medications  lisinopril (ZESTRIL) tablet 20  mg (has no administration in time range)  ondansetron (ZOFRAN-ODT) disintegrating tablet 4 mg (4 mg Oral Given 08/24/23 1601)  potassium chloride SA (KLOR-CON M) CR tablet 40 mEq (40 mEq Oral Given 08/25/23 0746)  lisinopril (ZESTRIL) tablet 20 mg (20 mg Oral Given 08/25/23 0746)  hydrochlorothiazide (HYDRODIURIL) tablet 12.5 mg (12.5 mg Oral Given 08/25/23 0746)    ED Course/ Medical Decision Making/ A&P                                 Medical Decision Making 77 year old female present emergency department with concerns of elevated blood pressure.  Initially 183/85 and some readings in the 150s on my evaluation patient was back to 180/70.  She is asymptomatic.  Workup reassuring troponin with mild elevation but flat and similar to prior.  Not having any chest pain.  Chest x-ray clear with no evidence of pulmonary edema.  CMP with normal kidney function.  Lipase normal.  UA with continued evidence of UTI, however patient currently on antibiotics which are seemingly appropriate for her.  No leukocytosis or fever to suggest systemic infection.  Patient given home dose of her blood pressure medications.  Potassium was slightly low.  Repleted.  She has follow-up with her primary doctor tomorrow.  Amount and/or Complexity of Data Reviewed Radiology:     Details: Patient seen 12/25 in the  emergency department CT head at that time negative.  We will therefore forego repeat imaging as she has had no new symptoms and has no focal deficits on exam.  Risk Prescription drug management.          Final Clinical Impression(s) / ED Diagnoses Final diagnoses:  Secondary hypertension    Rx / DC Orders ED Discharge Orders     None         Coral Spikes, DO 08/25/23 [id number]

## 2023-08-25 NOTE — Discharge Instructions (Signed)
Please follow-up with your primary doctor as discussed.  Continue to take your antibiotics for your infection.  Return immediately develop fevers, chills, sudden onset headache, vision changes, facial droop, unilateral weakness or any new or worsening symptoms that are concerning to you.

## 2023-08-26 LAB — URINE CULTURE: Culture: 20000 — AB

## 2023-11-24 ENCOUNTER — Other Ambulatory Visit: Payer: Self-pay

## 2023-11-24 DIAGNOSIS — E2839 Other primary ovarian failure: Secondary | ICD-10-CM

## 2023-12-04 ENCOUNTER — Ambulatory Visit
Admission: RE | Admit: 2023-12-04 | Discharge: 2023-12-04 | Disposition: A | Discharge status: Home / Self Care | Source: Ambulatory Visit

## 2023-12-04 DIAGNOSIS — E2839 Other primary ovarian failure: Secondary | ICD-10-CM

## 2024-05-10 ENCOUNTER — Telehealth: Payer: Self-pay | Admitting: Podiatry

## 2024-05-10 NOTE — Telephone Encounter (Signed)
 Pt would like referral for diabetic shoes

## 2024-06-14 ENCOUNTER — Encounter: Payer: Self-pay | Admitting: Podiatry

## 2024-06-14 ENCOUNTER — Ambulatory Visit: Admitting: Podiatry

## 2024-06-14 DIAGNOSIS — E1142 Type 2 diabetes mellitus with diabetic polyneuropathy: Secondary | ICD-10-CM

## 2024-06-14 DIAGNOSIS — M2041 Other hammer toe(s) (acquired), right foot: Secondary | ICD-10-CM

## 2024-06-14 DIAGNOSIS — M2042 Other hammer toe(s) (acquired), left foot: Secondary | ICD-10-CM | POA: Diagnosis not present
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
## 2024-08-17 ENCOUNTER — Other Ambulatory Visit (HOSPITAL_COMMUNITY): Payer: Self-pay | Admitting: Orthopedic Surgery

## 2024-08-17 DIAGNOSIS — Z96612 Presence of left artificial shoulder joint: Secondary | ICD-10-CM

## 2024-08-20 ENCOUNTER — Encounter (HOSPITAL_COMMUNITY)
Admission: RE | Admit: 2024-08-20 | Discharge: 2024-08-20 | Disposition: A | Discharge status: Home / Self Care | Source: Ambulatory Visit | Attending: Orthopedic Surgery | Admitting: Orthopedic Surgery

## 2024-08-20 DIAGNOSIS — Z96612 Presence of left artificial shoulder joint: Secondary | ICD-10-CM | POA: Insufficient documentation

## 2024-08-20 MED ORDER — TECHNETIUM TC 99M MEDRONATE IV KIT
20.0000 | PACK | Freq: Once | INTRAVENOUS | Status: AC | PRN
  Administered 2024-08-20: 20.7 via INTRAVENOUS

## 2024-08-24 ENCOUNTER — Encounter: Payer: Self-pay | Admitting: Nurse Practitioner

## 2024-08-24 ENCOUNTER — Other Ambulatory Visit: Payer: Self-pay | Admitting: Nurse Practitioner

## 2024-08-24 DIAGNOSIS — Z1231 Encounter for screening mammogram for malignant neoplasm of breast: Secondary | ICD-10-CM

## 2024-08-31 ENCOUNTER — Inpatient Hospital Stay: Admission: RE | Admit: 2024-08-31 | Discharge: 2024-08-31 | Attending: Nurse Practitioner

## 2024-08-31 DIAGNOSIS — Z1231 Encounter for screening mammogram for malignant neoplasm of breast: Secondary | ICD-10-CM

## 2024-09-09 ENCOUNTER — Ambulatory Visit: Payer: Self-pay

## 2024-09-09 VITALS — BP 132/87 | HR 79 | Ht 67.0 in | Wt 220.0 lb

## 2024-09-09 DIAGNOSIS — E785 Hyperlipidemia, unspecified: Secondary | ICD-10-CM | POA: Diagnosis not present

## 2024-09-09 DIAGNOSIS — I10 Essential (primary) hypertension: Secondary | ICD-10-CM | POA: Diagnosis not present

## 2024-09-09 DIAGNOSIS — Z23 Encounter for immunization: Secondary | ICD-10-CM

## 2024-09-09 DIAGNOSIS — F419 Anxiety disorder, unspecified: Secondary | ICD-10-CM | POA: Insufficient documentation

## 2024-09-09 DIAGNOSIS — Z794 Long term (current) use of insulin: Secondary | ICD-10-CM

## 2024-09-09 DIAGNOSIS — E11319 Type 2 diabetes mellitus with unspecified diabetic retinopathy without macular edema: Secondary | ICD-10-CM | POA: Diagnosis not present

## 2024-09-09 DIAGNOSIS — E038 Other specified hypothyroidism: Secondary | ICD-10-CM

## 2024-09-09 LAB — POCT GLYCOSYLATED HEMOGLOBIN (HGB A1C): HbA1c, POC (controlled diabetic range): 6.9 % (ref 0.0–7.0)

## 2024-09-09 NOTE — Patient Instructions (Signed)
 Kaitlyn Ferguson,   It was great seeing you in clinic today! You came in to establish care with a new primary care physician.  We checked your sugar level today (A1c), and it was very good at 6.9! Our goal is for this to be less than 7.  I am doing other labs on you today; results will go to mychart, and I will reach out if anything is abnormal.  I will see you back in just about a month for your physical, and we will talk about your sleep then. In the meantime, try to have a set bedtime routine, avoid screens before bed, and make sure you're sleeping in a cool, dark, quiet room.  Please bring all your medications to your next office visit.  Thank you for allowing me to be a part of your care team! Alan Flies, MD [facility] [address] [place], [place], [place] [address] [phone]

## 2024-09-09 NOTE — Assessment & Plan Note (Addendum)
 BP of 132/87, well-controlled on current medication regimen. - Continue amlodipine  10 mg daily, lisinopril  40 mg daily - BMP ordered

## 2024-09-09 NOTE — Progress Notes (Signed)
" ° ° °  SUBJECTIVE:   CHIEF COMPLAINT / HPI:   Kaitlyn Ferguson is a 79 y.o. female presenting to establish care. Previously followed here with Dr. Scarlet, last seen 08/29/2021.  Current concerns include:  Not sleeping well. Would get 2-3 hours, 4 hours, 7 hours a night; just all depending. Had a long time of sleeping poorly. Getting worse lately. Feels like she has a lot of thoughts which keep her up.  Following with podiatry annually for diabetic foot check.  Daughter passed away 08-11-2022.  Hx knee replacement (left), bilateral shoulder replacement; might need left shoulder reversal surgery  Healthcare Maintenance: discussed some today, patient returning for yearly physical. - Colonoscopy - patient refused - Medicare AWV - Diabetic foot exam - Diabetic eye exam - already following with an eye doctor; following regularly - Urine Alb/Cr ratio - ordered this visit - GFR measurement - ordered this visit - A1c - ordered this visit - Lipid panel - ordered this visit - Pneumococcal vaccine (second dose; first dose done 09/2019) - patient will think about - Flu shot - will get today - COVID-vaccine - Shingrix  PERTINENT  PMH / PSH: HTN, T2DM, HLD, hypothyroidism, macular degeneration, osteoarthritis   OBJECTIVE:   BP 132/87   Pulse 79   Ht 5' 7 (1.702 m)   Wt 220 lb (99.8 kg)   SpO2 95%   BMI 34.46 kg/m    General: Patient seated in chair, no acute distress. Cardiovascular: Regular rate and rhythm, no murmurs/rubs/gallops. Respiratory: Normal work of breathing on room air. Clear to auscultation bilaterally; no wheezes, crackles. Abdomen: Bowel sounds present and normoactive bilaterally. Soft, nondistended, nontender. Extremities: Skin warm, dry. No bilateral lower extremity edema.  ASSESSMENT/PLAN:   Assessment & Plan Type 2 diabetes mellitus with retinopathy, with long-term current use of insulin , macular edema presence unspecified, unspecified laterality, unspecified  retinopathy severity (HCC) A1c of 6.9 today.  Well-controlled with current medications. - Continue Farxiga 10 mg daily, Mounjaro 5 mg weekly - BMP, urine Alb/Cr ordered Primary hypertension BP of 132/87, well-controlled on current medication regimen. - Continue amlodipine  10 mg daily, lisinopril  40 mg daily - BMP ordered Hyperlipidemia, unspecified hyperlipidemia type No recent lipid panel. - Continue Crestor  5 mg daily - Lipid panel ordered Other specified hypothyroidism Recent TSH - Continue levothyroxine  100 mcg daily before breakfast - ASH ordered Encounter for immunization - Flu shot given today in clinic    Alan Flies, MD [facility] "

## 2024-09-09 NOTE — Assessment & Plan Note (Addendum)
 No recent lipid panel. - Continue Crestor  5 mg daily - Lipid panel ordered

## 2024-09-09 NOTE — Assessment & Plan Note (Addendum)
 A1c of 6.9 today.  Well-controlled with current medications. - Continue Farxiga 10 mg daily, Mounjaro 5 mg weekly - BMP, urine Alb/Cr ordered

## 2024-09-09 NOTE — Assessment & Plan Note (Signed)
 Recent TSH - Continue levothyroxine  100 mcg daily before breakfast - ASH ordered

## 2024-09-10 ENCOUNTER — Ambulatory Visit: Payer: Self-pay

## 2024-09-10 LAB — MICROALBUMIN / CREATININE URINE RATIO
Creatinine, Urine: 72.8 mg/dL
Microalb/Creat Ratio: 704 mg/g{creat} — ABNORMAL HIGH (ref 0–29)
Microalbumin, Urine: 512.3 ug/mL

## 2024-09-10 LAB — BASIC METABOLIC PANEL WITH GFR
BUN/Creatinine Ratio: 22 (ref 12–28)
BUN: 20 mg/dL (ref 8–27)
CO2: 25 mmol/L (ref 20–29)
Calcium: 9.4 mg/dL (ref 8.7–10.3)
Chloride: 102 mmol/L (ref 96–106)
Creatinine, Ser: 0.89 mg/dL (ref 0.57–1.00)
Glucose: 139 mg/dL — ABNORMAL HIGH (ref 70–99)
Potassium: 3.7 mmol/L (ref 3.5–5.2)
Sodium: 144 mmol/L (ref 134–144)
eGFR: 66 mL/min/1.73

## 2024-09-10 LAB — LIPID PANEL
Chol/HDL Ratio: 3.2 ratio (ref 0.0–4.4)
Cholesterol, Total: 125 mg/dL (ref 100–199)
HDL: 39 mg/dL — ABNORMAL LOW
LDL Chol Calc (NIH): 46 mg/dL (ref 0–99)
Triglycerides: 260 mg/dL — ABNORMAL HIGH (ref 0–149)
VLDL Cholesterol Cal: 40 mg/dL (ref 5–40)

## 2024-09-10 LAB — TSH: TSH: 2.12 u[IU]/mL (ref 0.450–4.500)

## 2024-09-10 NOTE — Progress Notes (Signed)
 Attempted to call patient x2 about Urine Alb/Cr elevation, unable to reach patient. Left voicemail instructing her to call back during business hours about lab results. Will also send MyChart message.  RN team, if patient calls back, please let her know that her urine microalbumin/creatinine level was very high, which could be an early warning sign of kidney damage. Thankfully, her creatinine on the blood test is normal, so there is no overt kidney damage at this time. We will need to keep her diabetes and blood pressure under good control, and will plan to repeat this lab in 6 months.urine microalbumin/creatinine level was very high, which could be an early warning sign of kidney damage. Thankfully, her creatinine on the blood test is normal, so there is no overt kidney damage at this time. We will need to keep her diabetes and blood pressure under good control, and will plan to repeat this lab in 6 months.

## 2024-09-30 ENCOUNTER — Ambulatory Visit

## 2024-09-30 VITALS — Ht 67.0 in | Wt 218.0 lb

## 2024-09-30 DIAGNOSIS — Z0000 Encounter for general adult medical examination without abnormal findings: Secondary | ICD-10-CM

## 2024-09-30 NOTE — Patient Instructions (Signed)
 Kaitlyn Ferguson,  Thank you for taking the time for your Medicare Wellness Visit. I appreciate your continued commitment to your health goals. Please review the care plan we discussed, and feel free to reach out if I can assist you further.  Please note that Annual Wellness Visits do not include a physical exam. Some assessments may be limited, especially if the visit was conducted virtually. If needed, we may recommend an in-person follow-up with your provider.  Ongoing Care Seeing your primary care provider every 3 to 6 months helps us  monitor your health and provide consistent, personalized care.   Referrals If a referral was made during today's visit and you haven't received any updates within two weeks, please contact the referred provider directly to check on the status.  Recommended Screenings:  Health Maintenance  Topic Date Due   Zoster (Shingles) Vaccine (1 of 2) Never done   Pneumococcal Vaccine for age over 74 (2 of 2 - PPSV23, PCV20, or PCV21) 11/25/2019   Complete foot exam   09/28/2021   Medicare Annual Wellness Visit  09/12/2022   Colon Cancer Screening  09/15/2023   COVID-19 Vaccine (3 - 2025-26 season) 04/26/2024   Eye exam for diabetics  05/18/2024   Hemoglobin A1C  03/09/2025   Yearly kidney function blood test for diabetes  09/09/2025   Kidney health urinalysis for diabetes  09/09/2025   Lipid (cholesterol) test  09/09/2025   DTaP/Tdap/Td vaccine (3 - Td or Tdap) 01/18/2027   Flu Shot  Completed   Osteoporosis screening with Bone Density Scan  Completed   Hepatitis C Screening  Completed   Meningitis B Vaccine  Aged Out   Breast Cancer Screening  Discontinued       09/30/2024    2:19 PM  Advanced Directives  Does Patient Have a Medical Advance Directive? Yes  Type of Advance Directive Living will;Healthcare Power of Attorney  Does patient want to make changes to medical advance directive? No - Patient declined  Copy of Healthcare Power of Attorney in Chart?  No - copy requested    Vision: Annual vision screenings are recommended for early detection of glaucoma, cataracts, and diabetic retinopathy. These exams can also reveal signs of chronic conditions such as diabetes and high blood pressure.  Dental: Annual dental screenings help detect early signs of oral cancer, gum disease, and other conditions linked to overall health, including heart disease and diabetes.  Please see the attached documents for additional preventive care recommendations.

## 2024-09-30 NOTE — Progress Notes (Signed)
 "  Video visit issue encountered: Connection dropped / would not connect.  Issue prevented completion of AWV via video. Video connection was lost when less than 50% of the duration of the visit was complete, at which time the remainder of the visit was completed via audio only.   Chief Complaint  Patient presents with   Medicare Wellness    SUBSEQUENT     Subjective:   Kaitlyn Ferguson is a 79 y.o. female who presents for a Medicare Annual Wellness Visit.  Visit info / Clinical Intake: Medicare Wellness Visit Type:: Subsequent Annual Wellness Visit Persons participating in visit and providing information:: patient Medicare Wellness Visit Mode:: Telephone (video disconnected, completed by audio) If telephone:: video error Since this visit was completed virtually, some vitals may be partially provided or unavailable. Missing vitals are due to the limitations of the virtual format.: Documented vitals are patient reported If Telephone or Video please confirm:: I connected with patient using audio/video enable telemedicine. I verified patient identity with two identifiers, discussed telehealth limitations, and patient agreed to proceed. Patient Location:: Home Provider Location:: Home Office Interpreter Needed?: No Pre-visit prep was completed: yes AWV questionnaire completed by patient prior to visit?: no Living arrangements:: (!) lives alone Patient's Overall Health Status Rating: (!) fair Typical amount of pain: none Does pain affect daily life?: no Are you currently prescribed opioids?: no  Dietary Habits and Nutritional Risks How many meals a day?: 3 (2-2 meals per day) Eats fruit and vegetables daily?: yes Most meals are obtained by: preparing own meals In the last 2 weeks, have you had any of the following?: none Diabetic:: (!) yes Any non-healing wounds?: no How often do you check your BS?: 1 Would you like to be referred to a Nutritionist or for Diabetic  Management? : no  Functional Status Activities of Daily Living (to include ambulation/medication): Independent Ambulation: Independent with device- listed below Home Assistive Devices/Equipment: Eyeglasses Medication Administration: Independent Home Management (perform basic housework or laundry): Independent Manage your own finances?: yes Primary transportation is: driving Concerns about vision?: no *vision screening is required for WT[facility]* Ananias Gaudy, MD. and Dr. Tobie) Concerns about hearing?: no  Fall Screening Falls in the past year?: 0 Number of falls in past year: 0 Was there an injury with Fall?: 0 Fall Risk Category Calculator: 0 Patient Fall Risk Level: Low Fall Risk  Fall Risk Patient at Risk for Falls Due to: No Fall Risks Fall risk Follow up: Falls evaluation completed; Education provided  Home and Transportation Safety: All rugs have non-skid backing?: N/A, no rugs All stairs or steps have railings?: N/A, no stairs Grab bars in the bathtub or shower?: yes Have non-skid surface in bathtub or shower?: yes Good home lighting?: yes Regular seat belt use?: yes Hospital stays in the last year:: no  Cognitive Assessment Difficulty concentrating, remembering, or making decisions? : no Will 6CIT or Mini Cog be Completed: yes 6CIT or Mini Cog Declined: patient alert, oriented, able to answer questions appropriately and recall recent events What year is it?: 0 points What month is it?: 0 points Give patient an address phrase to remember (5 components): [address] [place] About what time is it?: 0 points Count backwards from 20 to 1: 0 points Say the months of the year in reverse: 0 points Repeat the address phrase from earlier: 0 points 6 CIT Score: 0 points  Advance Directives (For Healthcare) Does Patient Have a Medical Advance Directive?: Yes Does patient want to make  changes to medical advance directive?: No - Patient declined Type of Advance  Directive: Living will; Healthcare Power of Attorney Copy of Healthcare Power of Attorney in Chart?: No - copy requested Copy of Living Will in Chart?: No - copy requested  Reviewed/Updated  Reviewed/Updated: Reviewed All (Medical, Surgical, Family, Medications, Allergies, Care Teams, Patient Goals)    Allergies (verified) Gabapentin  and Vancomycin    Current Medications (verified) Outpatient Encounter Medications as of 09/30/2024  Medication Sig   amLODipine  (NORVASC ) 10 MG tablet Take 10 mg by mouth daily.   Blood Glucose Monitoring Suppl (ONETOUCH VERIO) w/Device KIT Check blood sugars 2 times daily. Code: E11.9   FARXIGA 10 MG TABS tablet Take 10 mg by mouth daily.   glucose blood (ONETOUCH VERIO) test strip USE AS DIRECTED TWICE DAILY TO TEST BLOOD SUGAR   Insulin  Pen Needle (B-D UF III MINI PEN NEEDLES) 31G X 5 MM MISC USE TWICE A DAY   Lancet Devices (ONE TOUCH DELICA LANCING DEV) MISC Check blood sugars 2 times daily. Code: E11.9   Lancets (ONETOUCH DELICA PLUS LANCET33G) MISC USE TO TEST BLOOD SUGAR TWICE DAILY   levothyroxine  (SYNTHROID ) 100 MCG tablet Take 100 mcg by mouth daily before breakfast.   lisinopril  (ZESTRIL ) 40 MG tablet Take 40 mg by mouth daily.   MOUNJARO 5 MG/0.5ML Pen SMARTSIG:5 Milligram(s) SUB-Q Once a Week   Multiple Vitamin (MULTIVITAMIN WITH MINERALS) TABS tablet Take 1 tablet by mouth daily. With Vitamin D3   ondansetron  (ZOFRAN -ODT) 4 MG disintegrating tablet 4mg  ODT q4 hours prn nausea/vomit (Patient not taking: Reported on 09/09/2024)   rosuvastatin  (CRESTOR ) 5 MG tablet Take 1 tablet (5 mg total) by mouth daily.   TRESIBA  FLEXTOUCH 100 UNIT/ML FlexTouch Pen ADMINISTER 54 UNITS UNDER THE SKIN DAILY (Patient taking differently: Inject 40 Units into the skin daily.)   [DISCONTINUED] Insulin  Syringe-Needle U-100 31G X 5/16 1 ML MISC Use with insulin  as directed.   No facility-administered encounter medications on file as of 09/30/2024.    History: Past  Medical History:  Diagnosis Date   Anatomical narrow angle glaucoma with borderline intraocular pressure, right 02/01/2020   Arthritis    Cervical strain 02/28/2015   Diabetes mellitus    DJD (degenerative joint disease) 10/07/2008   DJD (degenerative joint disease)    Family history of adverse reaction to anesthesia    MOTHER WAS DIFFICULT TO WAKE UP   Hyperlipidemia    Hypertension    Hypothyroidism    Nausea    INTERMITANT - TAKES PRILOSEC (DENIES REFLUX)   Obesity    Osteomyelitis of ankle (HCC)    Complications of Right ankle injury   Swelling of hand joint, left 02/24/2021   Past Surgical History:  Procedure Laterality Date   ABDOMINAL HYSTERECTOMY     ANKLE FUSION  02/1996   Right ankle tib/talar   BACK SURGERY  1984   BREAST EXCISIONAL BIOPSY Right    CARPAL TUNNEL RELEASE  1993   Right arm   CHOLECYSTECTOMY     EYE SURGERY Left    NECK MASS EXCISION  12/29/2001   right eye laser     TONSILLECTOMY     Childhood   TOTAL KNEE ARTHROPLASTY Left 12/21/2015   Procedure: LEFT TOTAL KNEE ARTHROPLASTY WITH COMPLEX COMPUTER NAVIGATION ;  Surgeon: Redell Shoals, MD;  Location: [facility];  Service: Orthopedics;  Laterality: Left;   TOTAL SHOULDER ARTHROPLASTY Right 07/11/2016   Procedure: RIGHT TOTAL SHOULDER ARTHROPLASTY;  Surgeon: Franky Pointer, MD;  Location: [facility];  Service: Orthopedics;  Laterality: Right;   TOTAL SHOULDER ARTHROPLASTY Left 11/04/2019   Procedure: TOTAL SHOULDER ARTHROPLASTY;  Surgeon: Melita Drivers, MD;  Location: [facility];  Service: Orthopedics;  Laterality: Left;    Family History  Problem Relation Age of Onset   Stroke Mother    Heart failure Mother    Multiple myeloma Mother        Deceased at 18 due to same   Arthritis Mother    Hypertension Mother    Heart attack Father        First MI- early 48's   Diabetes Brother    Lung cancer Daughter    Diabetes Son    Heart attack Son    Obesity Son    Thyroid  disease Son    Thyroid  disease  Son    Thyroid  disease Son    Breast cancer Paternal Aunt    Social History   Occupational History   Occupation: Retired     Comment: worked in landscape architect   Tobacco Use   Smoking status: Never   Smokeless tobacco: Never  Vaping Use   Vaping status: Never Used  Substance and Sexual Activity   Alcohol  use: Never   Drug use: Never   Sexual activity: Not Currently   Tobacco Counseling Counseling given: Not Answered  SDOH Screenings   Food Insecurity: No Food Insecurity (09/30/2024)  Housing: Low Risk (09/30/2024)  Transportation Needs: No Transportation Needs (09/30/2024)  Utilities: Not At Risk (09/30/2024)  Depression (PHQ2-9): Low Risk (09/30/2024)  Physical Activity: Sufficiently Active (09/30/2024)  Social Connections: Moderately Isolated (09/30/2024)  Stress: No Stress Concern Present (09/30/2024)  Tobacco Use: Low Risk (09/30/2024)  Health Literacy: Adequate Health Literacy (09/30/2024)   See flowsheets for full screening details  Depression Screen PHQ 2 & 9 Depression Scale- Over the past 2 weeks, how often have you been bothered by any of the following problems? Little interest or pleasure in doing things: 0 Feeling down, depressed, or hopeless (PHQ Adolescent also includes...irritable): 0 PHQ-2 Total Score: 0 Trouble falling or staying asleep, or sleeping too much: 0 (Sleeps well with 6 hours average) Feeling tired or having little energy: 0 Poor appetite or overeating (PHQ Adolescent also includes...weight loss): 0 Feeling bad about yourself - or that you are a failure or have let yourself or your family down: 0 Trouble concentrating on things, such as reading the newspaper or watching television (PHQ Adolescent also includes...like school work): 0 Moving or speaking so slowly that other people could have noticed. Or the opposite - being so fidgety or restless that you have been moving around a lot more than usual: 0 Thoughts that you would be better off dead, or of hurting  yourself in some way: 0 PHQ-9 Total Score: 0 If you checked off any problems, how difficult have these problems made it for you to do your work, take care of things at home, or get along with other people?: Not difficult at all  Depression Treatment Depression Interventions/Treatment : EYV7-0 Score <4 Follow-up Not Indicated     Goals Addressed             This Visit's Progress    09/30/2024: My goal for 2026 is tolose 30-50 pounds by staying active.               Objective:    Today's Vitals   09/30/24 1418  Weight: 218 lb (98.9 kg)  Height: 5' 7 (1.702 m)  PainSc: 0-No pain   Body  mass index is 34.14 kg/m.  Hearing/Vision screen No results found. Immunizations and Health Maintenance Health Maintenance  Topic Date Due   Zoster Vaccines- Shingrix (1 of 2) Never done   Pneumococcal Vaccine: 50+ Years (2 of 2 - PPSV23, PCV20, or PCV21) 11/25/2019   FOOT EXAM  09/28/2021   Colonoscopy  09/15/2023   COVID-19 Vaccine (3 - 2025-26 season) 04/26/2024   OPHTHALMOLOGY EXAM  05/18/2024   HEMOGLOBIN A1C  03/09/2025   Diabetic kidney evaluation - eGFR measurement  09/09/2025   Diabetic kidney evaluation - Urine ACR  09/09/2025   LIPID PANEL  09/09/2025   Medicare Annual Wellness (AWV)  09/30/2025   DTaP/Tdap/Td (3 - Td or Tdap) 01/18/2027   Influenza Vaccine  Completed   Bone Density Scan  Completed   Hepatitis C Screening  Completed   Meningococcal B Vaccine  Aged Out   Mammogram  Discontinued        Assessment/Plan:  This is a routine wellness examination for [place].  Patient Care Team: Larraine Palma, MD as PCP - General (Family Medicine) Wonda Cy BROCKS, RD as Dietitian (Family Medicine) Tobie Baptist, MD as Consulting Physician (Ophthalmology) Octavia Bruckner, MD as Consulting Physician (Ophthalmology)  I have personally reviewed and noted the following in the patients chart:   Medical and social history Use of alcohol , tobacco or illicit drugs   Current medications and supplements including opioid prescriptions. Functional ability and status Nutritional status Physical activity Advanced directives List of other physicians Hospitalizations, surgeries, and ER visits in previous 12 months Vitals Screenings to include cognitive, depression, and falls Referrals and appointments  No orders of the defined types were placed in this encounter.  In addition, I have reviewed and discussed with patient certain preventive protocols, quality metrics, and best practice recommendations. A written personalized care plan for preventive services as well as general preventive health recommendations were provided to patient.   Roz LOISE Fuller, LPN   02/27/7972   Return in about 1 year (around 09/30/2025) for Medicare wellness.  After Visit Summary: (MyChart) Due to this being a telephonic visit, the after visit summary with patients personalized plan was offered to patient via MyChart   Nurse Notes:  6 CIT Score: 0 Patient due for: Foot Exam, Diabetic Eye Exam and Immunizations Declined DM and Nutrition education referral "

## 2024-10-08 ENCOUNTER — Ambulatory Visit: Payer: Self-pay
